# Patient Record
Sex: Female | Born: 1935 | Race: White | Hispanic: No | Smoking: Former smoker
Health system: Southern US, Community
[De-identification: ages and names within clinical notes are randomized; demographics above are authoritative.]

## PROBLEM LIST (undated history)

## (undated) DIAGNOSIS — E785 Hyperlipidemia, unspecified: Secondary | ICD-10-CM

## (undated) DIAGNOSIS — G479 Sleep disorder, unspecified: Secondary | ICD-10-CM

## (undated) DIAGNOSIS — R413 Other amnesia: Principal | ICD-10-CM

## (undated) DIAGNOSIS — G459 Transient cerebral ischemic attack, unspecified: Secondary | ICD-10-CM

## (undated) DIAGNOSIS — Z860100 Personal history of colon polyps, unspecified: Secondary | ICD-10-CM

## (undated) DIAGNOSIS — R269 Unspecified abnormalities of gait and mobility: Secondary | ICD-10-CM

## (undated) DIAGNOSIS — F0390 Unspecified dementia without behavioral disturbance: Secondary | ICD-10-CM

## (undated) DIAGNOSIS — G2581 Restless legs syndrome: Secondary | ICD-10-CM

## (undated) DIAGNOSIS — I8390 Asymptomatic varicose veins of unspecified lower extremity: Secondary | ICD-10-CM

## (undated) DIAGNOSIS — I10 Essential (primary) hypertension: Secondary | ICD-10-CM

## (undated) DIAGNOSIS — I6529 Occlusion and stenosis of unspecified carotid artery: Secondary | ICD-10-CM

## (undated) DIAGNOSIS — M47812 Spondylosis without myelopathy or radiculopathy, cervical region: Secondary | ICD-10-CM

## (undated) DIAGNOSIS — S060X9A Concussion with loss of consciousness of unspecified duration, initial encounter: Secondary | ICD-10-CM

## (undated) HISTORY — DX: Occlusion and stenosis of unspecified carotid artery: I65.29

## (undated) HISTORY — DX: Concussion with loss of consciousness of unspecified duration, initial encounter: S06.0X9A

## (undated) HISTORY — DX: Personal history of colonic polyps: Z86.010

## (undated) HISTORY — DX: Sleep disorder, unspecified: G47.9

## (undated) HISTORY — PX: REDUCTION MAMMAPLASTY: SUR839

## (undated) HISTORY — PX: ENDOVENOUS ABLATION SAPHENOUS VEIN W/ LASER: SUR449

## (undated) HISTORY — DX: Other amnesia: R41.3

## (undated) HISTORY — PX: HEMORRHOID SURGERY: SHX153

## (undated) HISTORY — DX: Unspecified abnormalities of gait and mobility: R26.9

## (undated) HISTORY — DX: Transient cerebral ischemic attack, unspecified: G45.9

## (undated) HISTORY — DX: Spondylosis without myelopathy or radiculopathy, cervical region: M47.812

## (undated) HISTORY — DX: Restless legs syndrome: G25.81

## (undated) HISTORY — DX: Asymptomatic varicose veins of unspecified lower extremity: I83.90

## (undated) HISTORY — PX: ANTERIOR AND POSTERIOR VAGINAL REPAIR W/ SACROSPINOUS LIGAMENT SUSPENSION: SUR6

## (undated) HISTORY — DX: Essential (primary) hypertension: I10

## (undated) HISTORY — DX: Hyperlipidemia, unspecified: E78.5

## (undated) HISTORY — DX: Personal history of colon polyps, unspecified: Z86.0100

## (undated) HISTORY — PX: JOINT REPLACEMENT: SHX530

---

## 1997-11-24 ENCOUNTER — Other Ambulatory Visit: Admission: RE | Admit: 1997-11-24 | Discharge: 1997-11-24 | Payer: Self-pay | Admitting: Family Medicine

## 1998-01-09 ENCOUNTER — Ambulatory Visit (HOSPITAL_COMMUNITY): Admission: RE | Admit: 1998-01-09 | Discharge: 1998-01-09 | Payer: Self-pay | Admitting: Gastroenterology

## 1998-07-02 ENCOUNTER — Ambulatory Visit (HOSPITAL_COMMUNITY): Admission: RE | Admit: 1998-07-02 | Discharge: 1998-07-02 | Payer: Self-pay | Admitting: Gastroenterology

## 1999-05-07 ENCOUNTER — Other Ambulatory Visit: Admission: RE | Admit: 1999-05-07 | Discharge: 1999-05-07 | Payer: Self-pay | Admitting: Obstetrics and Gynecology

## 2000-05-12 ENCOUNTER — Other Ambulatory Visit: Admission: RE | Admit: 2000-05-12 | Discharge: 2000-05-12 | Payer: Self-pay | Admitting: Obstetrics and Gynecology

## 2000-12-29 ENCOUNTER — Inpatient Hospital Stay (HOSPITAL_COMMUNITY): Admission: EM | Admit: 2000-12-29 | Discharge: 2000-12-30 | Payer: Self-pay | Admitting: Emergency Medicine

## 2000-12-29 ENCOUNTER — Encounter: Payer: Self-pay | Admitting: Emergency Medicine

## 2001-05-18 ENCOUNTER — Other Ambulatory Visit: Admission: RE | Admit: 2001-05-18 | Discharge: 2001-05-18 | Payer: Self-pay | Admitting: Obstetrics and Gynecology

## 2001-09-15 ENCOUNTER — Encounter: Admission: RE | Admit: 2001-09-15 | Discharge: 2001-09-15 | Payer: Self-pay

## 2001-09-29 ENCOUNTER — Ambulatory Visit (HOSPITAL_COMMUNITY): Admission: RE | Admit: 2001-09-29 | Discharge: 2001-09-29 | Payer: Self-pay | Admitting: Gastroenterology

## 2001-12-01 ENCOUNTER — Emergency Department (HOSPITAL_COMMUNITY): Admission: EM | Admit: 2001-12-01 | Discharge: 2001-12-01 | Payer: Self-pay | Admitting: Emergency Medicine

## 2001-12-01 ENCOUNTER — Encounter: Payer: Self-pay | Admitting: Emergency Medicine

## 2003-03-01 ENCOUNTER — Ambulatory Visit (HOSPITAL_COMMUNITY): Admission: RE | Admit: 2003-03-01 | Discharge: 2003-03-01 | Payer: Self-pay | Admitting: Obstetrics and Gynecology

## 2003-03-01 ENCOUNTER — Encounter: Payer: Self-pay | Admitting: Obstetrics and Gynecology

## 2003-03-16 ENCOUNTER — Inpatient Hospital Stay (HOSPITAL_COMMUNITY): Admission: RE | Admit: 2003-03-16 | Discharge: 2003-03-18 | Payer: Self-pay | Admitting: Obstetrics and Gynecology

## 2003-09-17 ENCOUNTER — Encounter: Admission: RE | Admit: 2003-09-17 | Discharge: 2003-09-17 | Payer: Self-pay | Admitting: Orthopedic Surgery

## 2004-01-26 ENCOUNTER — Observation Stay (HOSPITAL_COMMUNITY): Admission: EM | Admit: 2004-01-26 | Discharge: 2004-01-26 | Payer: Self-pay | Admitting: Emergency Medicine

## 2004-08-30 ENCOUNTER — Encounter: Admission: RE | Admit: 2004-08-30 | Discharge: 2004-10-03 | Payer: Self-pay | Admitting: Family Medicine

## 2005-02-06 ENCOUNTER — Ambulatory Visit (HOSPITAL_COMMUNITY): Admission: RE | Admit: 2005-02-06 | Discharge: 2005-02-06 | Payer: Self-pay | Admitting: Gastroenterology

## 2007-06-21 ENCOUNTER — Inpatient Hospital Stay (HOSPITAL_COMMUNITY): Admission: EM | Admit: 2007-06-21 | Discharge: 2007-06-24 | Payer: Self-pay | Admitting: Emergency Medicine

## 2008-10-21 ENCOUNTER — Encounter: Admission: RE | Admit: 2008-10-21 | Discharge: 2008-10-21 | Payer: Self-pay | Admitting: Specialist

## 2009-12-26 ENCOUNTER — Ambulatory Visit: Payer: Self-pay | Admitting: Vascular Surgery

## 2010-06-07 ENCOUNTER — Inpatient Hospital Stay (HOSPITAL_COMMUNITY)
Admission: EM | Admit: 2010-06-07 | Discharge: 2010-06-09 | Payer: Self-pay | Source: Home / Self Care | Attending: Internal Medicine | Admitting: Internal Medicine

## 2010-06-08 ENCOUNTER — Encounter (INDEPENDENT_AMBULATORY_CARE_PROVIDER_SITE_OTHER): Payer: Self-pay | Admitting: Internal Medicine

## 2010-06-14 ENCOUNTER — Ambulatory Visit
Admission: RE | Admit: 2010-06-14 | Discharge: 2010-06-14 | Payer: Self-pay | Source: Home / Self Care | Attending: Vascular Surgery | Admitting: Vascular Surgery

## 2010-06-14 ENCOUNTER — Ambulatory Visit: Admit: 2010-06-14 | Payer: Self-pay | Admitting: Vascular Surgery

## 2010-07-03 ENCOUNTER — Ambulatory Visit
Admission: RE | Admit: 2010-07-03 | Discharge: 2010-07-03 | Payer: Self-pay | Source: Home / Self Care | Attending: Vascular Surgery | Admitting: Vascular Surgery

## 2010-07-04 NOTE — Assessment & Plan Note (Signed)
OFFICE VISIT  Heather Floyd, Heather Floyd DOB:  1932/06/28                                       07/03/2010 [id number]#:[id number]  The patient presents today for continued discussion regarding her left leg venous hypertension.  I had seen her initially for this back in July 2011.  At that time she had a formal duplex showing reflux throughout her left greater saphenous vein and she did have mild reflux in her left common femoral vein.  At that time she was having no discomfort.  She has had progressive changes since the last 6 months and now has increasing pain and marked swelling in her left calf and ankle.  She works in [employer] for long shifts and stands the entire time and it is making it very difficult for her to do this.  She has worn compression garments off and on for many years but does not have a new fresh pair.  She does not have any history of DVT.  Sounds like her imaging reveals reflux into a large plexus of varicosities over the pretibial area.  Her formal duplex did show prior reflux in this area. We have fitted her with new compression garments today, 20-30 mmHg thigh- high compression and instructed her on the use of these.  I plan to see her again in 3 months to determine if this is giving her adequate treatment.  If not, we would recommend ablation of her saphenous vein in her left leg.    Larina Earthly, M.D. Electronically Signed  TFE/MEDQ  D:  07/03/2010  T:  07/04/2010  Job:  [id number]

## 2010-07-07 ENCOUNTER — Encounter: Payer: Self-pay | Admitting: Family Medicine

## 2010-08-26 LAB — CARDIAC PANEL(CRET KIN+CKTOT+MB+TROPI)
CK, MB: 2 ng/mL (ref 0.3–4.0)
Relative Index: INVALID (ref 0.0–2.5)
Troponin I: 0.01 ng/mL (ref 0.00–0.06)
Troponin I: 0.01 ng/mL (ref 0.00–0.06)

## 2010-08-26 LAB — COMPREHENSIVE METABOLIC PANEL
ALT: 15 U/L (ref 0–35)
AST: 19 U/L (ref 0–37)
Albumin: 3.1 g/dL — ABNORMAL LOW (ref 3.5–5.2)
CO2: 29 mEq/L (ref 19–32)
Chloride: 107 mEq/L (ref 96–112)
GFR calc Af Amer: >60 mL/min (ref >60)
GFR calc non Af Amer: 60 mL/min — ABNORMAL LOW (ref >60)
Potassium: 3.9 mEq/L (ref 3.5–5.1)
Sodium: 139 mEq/L (ref 135–145)
Total Bilirubin: 0.4 mg/dL (ref 0.3–1.2)

## 2010-08-26 LAB — DIFFERENTIAL
Basophils Relative: 1 % (ref 0–1)
Eosinophils Absolute: 0.1 10*3/uL (ref 0.0–0.7)
Eosinophils Relative: 3 % (ref 0–5)
Monocytes Relative: 13 % — ABNORMAL HIGH (ref 3–12)
Neutrophils Relative %: 56 % (ref 43–77)

## 2010-08-26 LAB — CBC
Hemoglobin: 11.7 g/dL — ABNORMAL LOW (ref 12.0–15.0)
Hemoglobin: 13 g/dL (ref 12.0–15.0)
MCH: 31.6 pg (ref 26.0–34.0)
MCHC: 35.7 g/dL (ref 30.0–36.0)
RBC: 3.73 MIL/uL — ABNORMAL LOW (ref 3.87–5.11)
WBC: 3.5 10*3/uL — ABNORMAL LOW (ref 4.0–10.5)

## 2010-08-26 LAB — BASIC METABOLIC PANEL
CO2: 26 mEq/L (ref 19–32)
Calcium: 9.7 mg/dL (ref 8.4–10.5)
Creatinine, Ser: 0.88 mg/dL (ref 0.4–1.2)
Glucose, Bld: 120 mg/dL — ABNORMAL HIGH (ref 70–99)
Sodium: 132 mEq/L — ABNORMAL LOW (ref 135–145)

## 2010-08-26 LAB — URINALYSIS, ROUTINE W REFLEX MICROSCOPIC
Bilirubin Urine: NEGATIVE
Glucose, UA: NEGATIVE mg/dL
Hgb urine dipstick: NEGATIVE
Ketones, ur: NEGATIVE mg/dL
pH: 6 (ref 5.0–8.0)

## 2010-08-26 LAB — LIPID PANEL: VLDL: 26 mg/dL (ref 0–40)

## 2010-08-26 LAB — URINE CULTURE
Colony Count: NO GROWTH
Colony Count: NO GROWTH
Culture  Setup Time: 201112240404
Culture  Setup Time: 201112242057
Culture: NO GROWTH

## 2010-08-26 LAB — URINE MICROSCOPIC-ADD ON

## 2010-08-26 LAB — POCT CARDIAC MARKERS
CKMB, poc: <1 ng/mL — ABNORMAL LOW (ref 1.0–8.0)
Troponin i, poc: <0.05 ng/mL (ref 0.00–0.09)

## 2010-08-26 LAB — PROTIME-INR: INR: 1.06 (ref 0.00–1.49)

## 2010-09-24 ENCOUNTER — Ambulatory Visit (INDEPENDENT_AMBULATORY_CARE_PROVIDER_SITE_OTHER): Payer: Medicare Other | Admitting: Vascular Surgery

## 2010-09-24 DIAGNOSIS — I83893 Varicose veins of bilateral lower extremities with other complications: Secondary | ICD-10-CM

## 2010-09-24 NOTE — Assessment & Plan Note (Signed)
OFFICE VISIT  Heather Floyd, Heather Floyd DOB:  [birth year]                                       09/24/2010 [id number]#:[id number]  Patient presents today for continued follow-up of her left leg venous hypertension.  She was seen most recently in January and has been in thigh-high 20-30 mm Hg graduated compression garments for 3 months.  She elevates her legs when possible and does take ibuprofen for the discomfort.  She reports that despite this, she continues to have pain related to standing.  She serves and cleans the school cafeteria, and her job requires long periods of standing, which is difficult due to the leg pain associated with the varices.  She also reports that cooking, cleaning, and shopping are difficult due to the leg pain as well, and she has had to reduce this activity.  PHYSICAL EXAMINATION:  She is a well-developed and well-nourished white female appearing younger than stated age of 76.  Blood pressure is 170/88, pulse 76, respirations 16.  She does have a palpable dorsalis pedis pulse on the left.  She has marked varicosities extending down through her medial thigh and over her pretibial area.  These are quite engorged and tender to her.  On duplex, she does have an anterior accessory branch of her great saphenous vein, which is leading directly into this large varices that extends the length of her legs.  I feel that she has clearly failed conservative therapy.  I would recommend laser ablation of her anterior accessory branch of her great saphenous vein to reduce her venous hypertension and stab phlebectomy of the varices throughout her thigh and calf.  I feel this would give her outstanding symptom relief.  She understands and wishes to proceed as soon as we can assure insurance coverage for her.    Larina Earthly, M.D. Electronically Signed  TFE/MEDQ  D:  09/24/2010  T:  09/24/2010  Job:  [id number]  cc:   Sigmund Hazel, M.D.

## 2010-10-08 ENCOUNTER — Ambulatory Visit: Payer: Self-pay | Admitting: Vascular Surgery

## 2010-10-22 ENCOUNTER — Ambulatory Visit (INDEPENDENT_AMBULATORY_CARE_PROVIDER_SITE_OTHER): Payer: Medicare Other | Admitting: Vascular Surgery

## 2010-10-22 DIAGNOSIS — I83893 Varicose veins of bilateral lower extremities with other complications: Secondary | ICD-10-CM

## 2010-10-23 NOTE — Assessment & Plan Note (Signed)
OFFICE VISIT  Heather Floyd, Heather Floyd DOB:  31-May-1933                                       10/22/2010 [id number]#:[id number]  This patient presents today for continued discussion regarding venous hypertension in her left leg.  She continues to have discomfort despite wearing compression.  She works as a Engineer, petroleum and has pain with prolonged standing, most particularly over the varicosities over her medial thighs and in the pretibial area extending down to the ankle.  I have re-imaged this today with SonoSite ultrasound.  There had been some confusion regarding the location of her pathology.  On her formal duplex study, her interpretation was of her great saphenous vein with reflux.  When I imaged this with SonoSite ultrasound, this vein still begins at the typical pattern in the saphenofemoral junction and extends over the medial thigh up towards her anterior thigh.  This is the more common location of the anterior branch of the saphenous vein.  On imaging this further, there is no other branch so this apparently is a more anterior location of her great saphenous vein.  This is the only vein present in her thigh.  I have recommended laser ablation of this great saphenous vein for treatment of her venous hypertension.  She does have multiple tributaries varicosities and she understands that this hopefully will be decompressed with ablation of her great saphenous vein.  If she has continued difficulty, she would be a candidate for stab phlebectomy if she needs further treatment.  Will schedule this at her earliest convenience.    Larina Earthly, M.D. Electronically Signed  TFE/MEDQ  D:  10/22/2010  T:  10/23/2010  Job:  [id number]

## 2010-10-29 NOTE — Discharge Summary (Signed)
[name], Floyd NO.:  [id number]   MEDICAL RECORD NO.:  [id number]          PATIENT TYPE:  INP   LOCATION:  1426                         FACILITY:  [facility]   PHYSICIAN:  Ramiro Harvest, MD    DATE OF BIRTH:  1933/06/01   DATE OF ADMISSION:  06/21/2007  DATE OF DISCHARGE:  06/24/2007                               DISCHARGE SUMMARY   PRIMARY CARE PHYSICIAN:  Sigmund Hazel, M.D. of [facility].   GASTROENTEROLOGIST:  Dr. Ewing Schlein of [facility].   DISCHARGE DIAGNOSES:  1. Viral gastroenteritis.  2. Anemia.  3. Hypokalemia.  4. Hyperlipidemia.  5. Osteoporosis.  6. History of colonic polyps.  7. Cervical disk disease.  8. Restless leg syndrome.  9. Internal/external hemorrhoids.   DISCHARGE MEDICATIONS:  1. Celebrex 200 mg p.o. daily.  2. Avalide 150/12.5 mg p.o. daily.  3. Tri-Chlor 145 mg p.o. daily.  4. Vitamin E 400 international units daily.  5. Aspirin 81 mg p.o. daily.  6. Tylenol arthritis daily.  7. Vitamin B12 250 mg daily.  8. Multivitamin one tablet daily.  9. Vitamin D 400 international units daily.  10.Vitamin C 500 mg daily.  11.Zocor 40 mg q.h.s.  12.Flomax 70 mg q. Weekly.  13.Glucosamine daily.  14.Correctol p.r.n.  15.Tylenol Extra Strength as needed.  16.Cosamin DS daily.  17.Mirapex 0.25 mg 1-2 hours before bedtime.   DISPOSITION/FOLLOWUP:  The patient is to schedule a follow-up  appointment with her primary care physician in the next 1-2 weeks.  A  follow-up basic metabolic profile needs to be checked to follow up on  the patient's electrolytes, mainly her potassium levels.  CBC also needs  to be checked to follow up on the patient's hemoglobin.  The patient had  a recent colonoscopy which was done by Dr. Ewing Schlein in August 2006 which  revealed internal and external hemorrhoids and follow-up for that is in  the next 5 years.  May follow-up with Dr. Ewing Schlein as needed.   PROCEDURES PERFORMED:  1. A CT of the abdomen and pelvis  was performed on June 21, 2007,      which showed significant inflammatory process involving the small      bowel loops in the primary left abdomen and diffuse wall thickening      of the small bowel associated mesenteric edema.  The differential      includes inflammatory bowel disease/Crohn's disease versus      infectious neuritis, ischemia not excluded.  No definite findings      of acute vascular process additionally noted, single minimally      enlarged portacaval lymph nodes, bibasilar atelectasis in the lower      lobe, small bowel inflammatory process as previously identified.      Infectious enteritis inflammatory bowel disease versus Crohn      disease over ischemia on the CT of the pelvis.  A CT angiogram was      done on June 22, 2007 which showed no significant arterial      occlusive disease of the visceral vasculature to resolve the  mesenteric ischemia.  Small bowel wall thickening has improved.   CONSULTATIONS:  A GI consultation was done with [facility].  The patient was seen on June 22, 2007.  The patient was seen by Dr.  Charlott Rakes of [facility]   ADMISSION HISTORY AND PHYSICAL:  Heather Floyd is a 77 year old white  female with history of hypertension and hyperlipidemia, history of  colonic polyps followed by Vida Rigger, history of cervical disk  disease/arthritis and osteoporosis who presents with complaints of  abdominal pain.  The patient stated that almost 2 weeks prior to  admission, specifically the day after Christmas.  She began having some  vague abdominal pain.  She thought it may have been related to something  she ate the day before and also thought it would eventually resolve.  Pain continued and following New Years it began to worsen.  The patient  admits to nausea and one episode of vomiting.  The patient denies  diarrhea.  She reports that pain is diffuse when asked to further  characterize it.  She states that it is just  a pain that not sharp.  Rates it at a 9/10 in intensity at its worse.  The patient denies any  sick contacts.  Also, denies fevers, dysuria and no melena.  She admits  to some bright red blood per rectum, but usually only when she is  straining.  The patient denies chest pain, cough, no shortness of  breath.  The patient was seen in the ED.  CT scan of abdomen was done  which revealed significant inflammatory process involving small bowel  loops in primarily the left.  Abdomen with diffuse wall thickening of  the small bowel and associated mesenteric edema.  Lipase was done which  was within normal limits and the urinalysis unremarkable.  LFTs also  unremarkable.  The patient admitted for further evaluation and  management.   PHYSICAL EXAMINATION:  VITAL SIGNS:  On admission, temperature 97.5,  blood pressure 105/50 initially 130/62, pulse of 94 initially 136,  respiratory rate 18, sating 96%.  GENERAL:  The patient was pleasant elderly white female, no respiratory  distress.  HEENT: Normocephalic, atraumatic.  Pupils equal, round and reactive to  light.  Extraocular movements intact.  Mucous membranes were slightly  dry.  No oral exudates or lesions.  RESPIRATORY:  Lungs clear to auscultation bilaterally.  No crackles or  wheezes.  CARDIOVASCULAR: Regular rate and rhythm.  Normal S1-S2.  ABDOMEN:  Soft, diffusely tender, no rebound tenderness.  Positive bowel  sounds.  EXTREMITIES:  No clubbing, cyanosis or edema.  NEUROLOGICAL:  The patient was alert and oriented x3.  Cranial nerves II-  XII grossly intact.  Nonfocal exam.   LABORATORY DATA:  Admission labs as stated in HPI.   HOSPITAL COURSE:  #1 - VIRAL GASTROENTERITIS.  Initially, per CT scan,  the differential included infectious process versus inflammatory process  with ischemic process.  CT angio of the abdomen was done with results as  stated above.  The patient was put on bowel rest.  Pain management was  done.  The  patient was placed on IV fluids.  A lactic acid level was  checked which was negative.  Lipase was also checked which was negative.  Cardiac enzymes were cycled which were negative.  Stool cultures were  also obtained which were negative.  The patient continued to improve.  Gastroenterology followed the patient throughout the hospitalization.  The patient continued to improve daily.  By day of discharge the patient  was in stable and improved condition.  The patient had no complaints of  abdominal pain.  The patient was tolerating oral diet well with no  abdominal pain.  The patient was discharged in stable and improved  condition.  #2 - ANEMIA.  The patient did come in with anemia.  Hemoglobin of 11.4.  anemia panel was obtained which showed the patient with iron deficiency  likely due to chronic disease.  The patient had been followed as  outpatient per GI and has had a colonoscopy done in August 2006.  #3 - HYPERKALEMIA.  The patient's potassium was repleted throughout the  hospitalization.  On day of discharge, the patient had a potassium of  3.3 was given a dose of K-Dur and will have to follow-up B-met per PCP.  The rest of the patient's chronic medical issues were stable throughout  the hospitalization.  The patient was discharged in stable and improved  condition.   PHYSICAL EXAMINATION:  VITAL SIGNS: Temperature 98.4, pulse of 66, blood  pressure 135/53, respiratory rate 20, sating 94% on room air.   DISCHARGE LABORATORIES:  Sodium 141, potassium 3.3, chloride 109, bicarb  26, BUN 4, creatinine 0.68, calcium of 8.3, glucose of 99, white count  5.0, hemoglobin 11.1, platelets 237, hematocrit 31.1.  RBCs of 3.61,  iron of 37, TIBC of 289, percent sats of 13.  The patient will be  discharged in stable and improved condition.   It has been a pleasure taking care of [name].      Ramiro Harvest, MD  Electronically Signed     DT/MEDQ  D:  06/24/2007  T:  06/24/2007   Job:  [id number]   cc:   Sigmund Hazel, M.D.  Fax: [phone]   Petra Kuba, M.D.  Fax: [phone]

## 2010-10-29 NOTE — Assessment & Plan Note (Signed)
OFFICE VISIT   Heather Floyd, Heather Floyd  DOB:  [birth year]                                       06/14/2010  [id number]#:[id number]   This is a new patient consultation.  Primary physician is Sigmund Hazel,  M.D.   HISTORY OF PRESENT ILLNESS:  This is a 77 year old female who presents  with a chief complaint of episodes of dizziness and visual change.  She  apparently was seen at an outside hospital and admitted to the outside  hospital on 06/07/2010 by Dr. Isidoro Donning at that point for what was thought to  be possibly a transient ischemic attack.  Her workup included a CT which  initially was read as possibly consistent with some bilateral lacunar  strokes.  Subsequent head MRI demonstrated no infarctions.  MRA of the  neck however demonstrated a left subclavian artery stenosis and also a  moderate to high-grade stenosis of proximal right external carotid  artery.  There was no internal carotid artery stenosis on it.  In  talking with the patient she notes that what started this entire episode  was waking up around 5:00 and she awoke with blurry vision and dizziness  greater than her baseline vertigo that she has intermittently.  Based on  that she went into the ER for evaluation and ended up being admitted  with the above workup.  She was referred to [place] for evaluation for  possible surgically amenable carotid disease.  At this point the patient  denies any other episodes of stroke or TIA.  Specifically no episodes of  amaurosis fugax or monocular blindness.  She has never had any facial  droop or hemiplegia and also she has never had any expressive or  receptive aphasia.  Her risk factors for carotid disease include  hyperlipidemia, hypertension.  Her risk factor management includes the  use of Zocor, aspirin and lisinopril.  Her neurologic review of systems  was positive for baseline vertigo and this episode of dizziness.  Otherwise she did not have any seizure, para or  anesthesia history.  No  headaches, dementia, parkinsonism or Alzheimer's disease.   PAST MEDICAL HISTORY:  Included possible TIA, hyponatremia, hypokalemia,  anxiety, restless leg syndrome, hyperlipidemia, UTI, chronic vertigo,  osteoporosis, cervical degenerative disk disease, hemorrhoids, history  of vaginal prolapse, history of possible carcinoid syndrome, GI  bleeding.   PAST SURGICAL HISTORY:  Included some type of vaginal suspension  procedure.   SOCIAL HISTORY:  She denied ever smoking.  No alcohol or illicit drug  use.   FAMILY HISTORY:  Father had metastatic cancer to his bones, mother had  Alzheimer's disease.   MEDICATIONS:  Her medications included aspirin, Keflex, calcium,  Fosamax, Vicodin, lisinopril, fenofibrate, multivitamin, Requip, Zocor,  Tylenol, vitamin D.   ALLERGIES:  She had allergy to codeine.   PHYSICAL EXAMINATION:  Today vital signs were blood pressure in the  right arm of 167/71, left 162/71, heart rate of 65, respirations 12,  satting 97% on room air.  General:  Alert and oriented x3, well-developed, well-nourished, no  apparent distress.  Head:  Normocephalic, atraumatic.  ENT:  Hearing is grossly intact.  Nares without erythema or drainage.  Oropharynx without any erythema or exudate.  Eyes:  Pupils were equal, round, reactive to light.  Extraocular  movements were intact.  Neck:  Supple neck.  No nuchal rigidity or  palpable lymphadenopathy.  Pulmonary:  Symmetric expansion, good air movement.  Clear to  auscultation bilaterally.  No rales, rhonchi or wheezing.  Cardiac:  Regular rate and rhythm.  Normal S1-S2.  No murmurs, rubs,  thrills or gallops.  Vascular:  Pulses were palpable in all extremities.  Carotids did not  have any carotid bruits.  The aorta was not palpable.  GI:  Soft abdomen, nontender, nondistended, no guarding, no rebound, no  hepatosplenomegaly.  No masses.  No costovertebral angle tenderness.  Musculoskeletal:  All  extremities had 5/5 strength.  There were no signs  of any ischemia, gangrene or ulcerations in any extremity.  Neurological:  Cranial nerves II-XII were intact.  Motor was as above.  Sensation was grossly intact in all extremities.  Psychiatric:  Judgment was intact.  Mood and affect were appropriate for  her clinical situation.  Skin:  There were no rashes noted on her body.  Her extremities were as  listed above.  Lymphatic:  The was no cervical, axillary or inguinal lymphadenopathy.   NONINVASIVE VASCULAR IMAGING:  She had bilateral carotid duplexes which  demonstrated no disease in the internal or external carotid arteries.   MEDICAL DECISION MAKING:  This is a 77 year old female that presents  with a history that I am not convinced is consistent with a TIA.  She  had no evidence of any residual stroke on examination.  She has a  carotid duplex which is completely normal.  I think in reviewing her  history some additional differential including evaluation of her eyes  with a slit lamp exam to look for any macular degeneration or glaucoma  is indicated.  Also there are components of the history that are more  consistent where a cardiogenic etiology.  At this point she will not  need any vascular surgical intervention.  I discussed my concerns with  the patient and she agrees she is going to go see her eye doctor and  then the rest of her workup she will continue with her primary care  physician.   I would like to thank you for giving [place] the opportunity to participate  in this patient's care.  We are available to help her in any fashion as  needed.  Thank you very much.     Fransisco Hertz, MD  Electronically Signed   BLC/MEDQ  D:  06/14/2010  T:  06/14/2010  Job:  [id number]   cc:   Sigmund Hazel, M.D.

## 2010-10-29 NOTE — Procedures (Signed)
LOWER EXTREMITY VENOUS REFLUX EXAM   INDICATION:  Left lower extremity varicose veins with pain.   EXAM:  Using color-flow imaging and pulse Doppler spectral analysis, the  left common femoral, superficial femoral, popliteal, posterior tibial,  greater and lesser saphenous veins are evaluated.  There is evidence  suggesting deep venous insufficiency in the left lower extremity.   The left saphenofemoral junction is not competent with Reflux of  >538milliseconds. The left GSV is not competent with Reflux of  >554milliseconds with the caliber as described below.   The left proximal short saphenous vein demonstrates competency.   GSV Diameter (used if found to be incompetent only)                                            Right    Left  Proximal Greater Saphenous Vein           cm       0.54 cm  Proximal-to-mid-thigh                     cm       cm  Mid thigh                                 cm       0.5 cm  Mid-distal thigh                          cm       cm  Distal thigh                              cm       0.42 cm  Knee                                      cm       0.38 cm   IMPRESSION:  1. Left greater saphenous vein Reflux with >567milliseconds is      identified with the caliber ranging from 0.42 cm to 0.54 cm knee to      groin.  2. The left greater saphenous vein is not aneurysmal.  3. The left greater saphenous vein is tortuous mid thigh and distal.  4. The deep venous system is not competent with Reflux of      >538milliseconds.  5. The left lesser saphenous vein is competent.   ___________________________________________  Larina Earthly, M.D.   AS/MEDQ  D:  12/26/2009  T:  12/26/2009  Job:  [id number]

## 2010-10-29 NOTE — H&P (Signed)
Heather Floyd, Heather Floyd NO.:  [id number]   MEDICAL RECORD NO.:  [id number]          PATIENT TYPE:  INP   LOCATION:  1426                         FACILITY:  [facility]   PHYSICIAN:  Kela Millin, M.D.DATE OF BIRTH:  1933-05-14   DATE OF ADMISSION:  06/21/2007  DATE OF DISCHARGE:                              HISTORY & PHYSICAL   PRIMARY CARE PHYSICIAN:  Sigmund Hazel, M.D.   CHIEF COMPLAINT:  Abdominal pain.   HISTORY OF PRESENT ILLNESS:  The patient is a 77 year old white female  with history of hypertension, hyperlipidemia, history of colon polyps  (followed by Petra Kuba, M.D.), cervical disk disease/arthritis and  osteoporosis who presents with above complaints.  She states that almost  two weeks ago (the day after Christmas), she began having some vague  abdominal pain.  She thought that it might have been related to  something she ate the day before and also thought it would eventually  resolve.  The pain continued and following New Years it began to worsen.  She admits to nausea and vomiting x1 episode.  She denies diarrhea.  She  reports that the pain is diffuse and when asked to further characterize  she states that it is just a pain, not sharp.  She rates it at 9/10 in  intensity at its worst.  She denies any sick contacts.  She also denies  fevers, dysuria, and no melena.  She admits to some bright red blood per  rectum but usually only when she strains.  She denies chest pain, cough,  and no shortness of breath.   The patient was seen in the ER and a CT scan of her abdomen was done  which revealed a significant inflammatory process involving small bowel  loops in primarily the left abdomen, with diffuse wall thickening of  small bowel and associated mesenteric edema.  A lipase was done which  was within normal limits and her urinalysis unremarkable.  Her LFTs were  also unremarkable and she is admitted for further evaluation and  management.   PAST  MEDICAL HISTORY:  As above.   MEDICATIONS:  Ambien, aspirin, Avalide, calcium, Celebrex, folic acid,  Fosamax, glucosamine, Mirapex, Premarin, trazodone, TriCor, Tylenol,  vitamin C, and Zocor.  She does not know the dosages.   ALLERGIES:  No known drug allergies.   SOCIAL HISTORY:  She states that she smoked briefly as a teenager.  She  denies alcohol.   FAMILY HISTORY:  Her father had metastatic cancer to the bones and her  mother had Alzheimer's.   REVIEW OF SYSTEMS:  As per HPI, other review of systems negative.   PHYSICAL EXAMINATION:  GENERAL:  She is a pleasant, elderly white female  in no respiratory distress.  VITAL SIGNS:  Her temperature is 97.5, blood pressure 105/50 initially  130/62, pulse 94 initially 136, respiratory rate 18, O2 saturations 96%.  HEENT:  PERRL, EOMI, slightly dry mucous membranes.  No oral exudates.  LUNGS:  Clear to auscultation bilaterally, no crackles or wheezes.  CARDIOVASCULAR:  Regular rate and rhythm, normal S1 and S2.  ABDOMEN:  Soft, diffusely tender, no rebound tenderness, bowel sounds  present.  EXTREMITIES:  No cyanosis and no edema.  NEUROLOGY:  Alert and oriented x3.  Cranial nerves II-XII grossly  intact.  Nonfocal examination.   LABORATORY DATA:  As per HPI.   ASSESSMENT:  1. Abdominal pain.  Ischemia versus inflammatory bowel disease versus      infectious.  CT scan of abdomen as above.  We will keep NPO, IV      analgesics for pain management, IV fluids for supportive care and      follow.  We will also obtain a lactic acid level, cardiac enzymes,      stool cultures, a CBC, and follow and consult gastroenterology      versus surgery pending above studies for further recommendations.  2. Hypertension.  Monitor and treat as appropriate.  3. Hyperlipidemia.  Follow and resume outpatient medications when able      to tolerate p.o.      Kela Millin, M.D.  Electronically Signed     ACV/MEDQ  D:  06/22/2007  T:   06/22/2007  Job:  [id number]   cc:   Sigmund Hazel, M.D.  Fax: [phone]

## 2010-10-29 NOTE — Consult Note (Signed)
NEW PATIENT CONSULTATION   Heather Floyd, Heather Floyd  DOB:  Oct 15, 1932                                       12/26/2009  ZOXWR#:[id number]   The patient presents today for evaluation of lower extremity venous  pathology.  She is a very active, healthy 77 year old white female with  a long history of venous varicosities.  She relates an episode  approximately 30 years ago where she was felt to have left leg DVT and  was hospitalized for 10-12 days.  Details of this are not available.  She does not have any significant swelling in her lower extremities.  She does have marked venous varicosities more so in her left leg than  her right leg.  She does have marked spider vein telangiectasia on both  legs as well.  She does not have any history of bleeding and reports no  significant pain associated with these.  She does have elevated blood  pressure and elevated cholesterol.  No history of diabetes and no  cardiac difficulties.   SOCIAL HISTORY:  She is widowed with three children.  She works for  [employer] system.  She quit smoking 30 years ago.  Does not  drink alcohol.   Her family history is significant for venous varicosities in her mother.  No premature vascular disease.   REVIEW OF SYSTEMS:  Is noted in the chart.  Specifically no cardiac  difficulty.  Does have history of DVT and phlebitis.  MUSCULOSKELETAL:  For arthritis and joint pain in her hands.  GI:  For constipation.  Otherwise negative.  No weight loss or gain.  Her weight is 120 pounds.   PHYSICAL EXAMINATION:  General:  A well-developed, well-nourished white  female appearing stated age in no acute stress.  Vital signs:  Blood  pressure is 160/74, pulse 93, respirations 16.  HEENT:  Normal.  She has  2+ radial and 2+ dorsalis pedis pulses bilaterally.  Musculoskeletal:  Shows no major deformities or cyanosis.  Neurological:  No focal  weakness or paresthesias.  Skin:  Without ulcers or rashes.   She does  have marked pretibial varicosities in the left leg and some on her  thigh.  She does have extensive telangiectasia throughout both legs most  prominently on her right anterior thigh.   She underwent noninvasive vascular laboratory studies in our office and  this did show some reflux in her deep system on the left.  She does have  some reflux in her saphenous vein near the saphenofemoral junction with  these varicosities arising from this.  I had a long discussion with the  patient regarding the significance of this.  She does have reflux in her  superficial system and deep system on the left.  I explained that this  superficial hypertension could be treated but that we would recommend  this for either pain or the concern regarding appearance.  She reports  that she does not have any significant discomfort currently and is  comfortable with observation only.  She was reassured that this should  not put her at any increased risk for serious difficulty such as DVT.  She will see [place] again on an as-needed basis.     Larina Earthly, M.D.  Electronically Signed   TFE/MEDQ  D:  12/26/2009  T:  12/27/2009  Job:  [id number]  cc:   Pam Drown, M.D.

## 2010-10-29 NOTE — Procedures (Signed)
CAROTID DUPLEX EXAM   INDICATION:  Followup carotid artery disease.   HISTORY:  Diabetes:  No.  Cardiac:  No.  Hypertension:  Yes.  Smoking:  Previous.  Previous Surgery:  No.  CV History:  The patient is currently asymptomatic.  Amaurosis Fugax No, Paresthesias No, Hemiparesis No                                       RIGHT             LEFT  Brachial systolic pressure:         140               146  Brachial Doppler waveforms:         WNL               WNL  Vertebral direction of flow:        Antegrade         Antegrade  DUPLEX VELOCITIES (cm/sec)  CCA peak systolic                   67                76  ECA peak systolic                   79                99  ICA peak systolic                   50                67  ICA end diastolic                   13                21  PLAQUE MORPHOLOGY:                  Soft plaque       Soft plaque  PLAQUE AMOUNT:                      Minimal           Minimal  PLAQUE LOCATION:                    Bifurcation, ICA  Bifurcation, ICA   IMPRESSION:  Bilaterally no hemodynamically significant stenosis.  Tortuous right internal carotid artery.   ___________________________________________  Fransisco Hertz, MD   OD/MEDQ  D:  06/14/2010  T:  06/14/2010  Job:  [id number]

## 2010-10-29 NOTE — Consult Note (Signed)
Heather Floyd, Heather Floyd NO.:  [id number]   MEDICAL RECORD NO.:  [id number]          PATIENT TYPE:  INP   LOCATION:  1426                         FACILITY:  [facility]   PHYSICIAN:  Shirley Friar, MDDATE OF BIRTH:  05/25/1933   DATE OF CONSULTATION:  06/22/2007  DATE OF DISCHARGE:                                 CONSULTATION   REFERRING PHYSICIAN:  Kela Millin, M.D.   REASON FOR CONSULTATION:  Abdominal pain, abnormal CT scan.   HISTORY OF PRESENT ILLNESS:  This is a 77 year old white female who  reports being in her usual state of health until Saturday when she was  sick on her stomach and vomited one time and also had severe generalized  abdominal pain.  The pain persisted from January 3, and she came in for  management regarding that on January 5.  On presentation, she had a CT  scan done which showed some small-bowel wall thickening in the left  abdomen and left pelvis with small to moderate amount of free pelvic  fluid.  The colon and distal small-bowel loop was noted to be  unremarkable.  There was no free air seen on the CT scan.   She also reports that she has been having some lower back pain that  started around the time of this abdominal discomfort.  Her last meal  prior to the onset of this pain was at [employer] on Friday night.  She  denies any sick contacts or new medicines.  She also denies any similar  pain in the past like this.  The pain has been so severe she has been  unable to eat or sleep since it started.  She has a history of a  colonoscopy in 2006 which showed hemorrhoids but otherwise was normal,  although the area underneath the ileocecal valve could not be  identified.   PAST MEDICAL HISTORY:  1. Hypertension.  2. Hypercholesterolemia.  3. History of carcinoid tumor.  4. Cervical disk disease.  5. History of hemorrhoids.  6. Status post appendectomy.  7. Status post hysterectomy.  8. Status post bilateral  salpingo-oophorectomy.  9. History of pelvic prolapse.  10.History of vaginal prolapse.   MEDICATIONS:  See chart.   PHYSICAL EXAMINATION:  VITAL SIGNS:  Temperature 98.1, pulse 70, blood  pressure 104/62, O2 saturation 94% on room air.  GENERAL:  Alert, mild acute distress.  ABDOMEN:  Distended, generalized tenderness with guarding, no rebound,  positive bowel sounds.   LABORATORY DATA:  White blood count 4.1, hemoglobin 10.8, platelet count  208. Lipase 16.  Lactic acid 0.8.   IMPRESSION:  A 77 year old white female with sudden onset of severe  abdominal pain, nausea and one episode of vomiting with the pain going  on for the past 3 days.  CT scan  reviewed showing some thickened areas  of small bowel.  Main differential regarding her presentation is  gastroenteritis versus mesenteric ischemia.  I doubt she has Crohn's  disease as the source of her symptoms.   PLAN:  1. Do a CT abdominal angiogram to further evaluate  and look for any      evidence of ischemia.  2. Will continue bowel rest, IV fluids.  3. May also need to consider small-bowel series if angiogram is      negative for any significant stenoses.      Shirley Friar, MD  Electronically Signed     VCS/MEDQ  D:  06/22/2007  T:  06/22/2007  Job:  [id number]   cc:   Petra Kuba, M.D.  Fax: [phone]

## 2010-11-01 NOTE — Discharge Summary (Signed)
NAME:  Heather Floyd, Heather Floyd                           ACCOUNT NO.:  [id number]   MEDICAL RECORD NO.:  [id number]                   PATIENT TYPE:  INP   LOCATION:  9324                                 FACILITY:  [facility]   PHYSICIAN:  Huel Cote, M.D.              DATE OF BIRTH:  06/02/1933   DATE OF ADMISSION:  03/15/2003  DATE OF DISCHARGE:  03/18/2003                                 DISCHARGE SUMMARY   DISCHARGE DIAGNOSES:  1. Pelvic prolapse, cuff prolapse, cystocele, and rectocele.  2. Status post anterior posterior repair with sacrospinous ligament     suspension.  3. Status post postoperative fever treated with IV antibiotics.   DISCHARGE MEDICATIONS:  1. Motrin 600 mg p.o. every six hours p.r.n.  2. Percocet one to two tablets p.o. every four hours p.r.n.  3. Tricor one p.o. daily.  4. Avalide 150/12.5 one p.o. daily.   DISCHARGE FOLLOWUP:  The patient is to follow up in approximately four to  six weeks in the office for pelvic exam.   HOSPITAL COURSE:  The patient is a 77 year old female who presented for  postoperative care status post an anterior posterior repair and sacrospinous  ligament suspension which was performed on March 15, 2003. The patient  immediately on postoperative day #1 complained of severe nausea and did not  actually have any emesis; however, felt very nauseated for most of the day.  This improved when she was placed on nonnarcotic pain medicine. Her  temperature spiked unexpectedly to 102.8, and at that point, she was placed  on IV Unasyn for antibiotic coverage. A urine sample was obtained and CBC  and white blood cell count remained normal. Urine culture returned negative.  Her postoperative hemoglobin did drop from 13.1 to 10.4 on postoperative day  #1 and 9.1 on postoperative day #2. By the afternoon of postoperative day  #2, this had stabilized to 10.0. The patient continued to gradually improve;  however, retained some temperature until  postoperative day #3 when she  became completely afebrile. Physical exam remained benign the entire  hospital course with a soft abdomen and nontender. Her vaginal packing was  removed on first postoperative day with minimal bleeding noted, and there  was no significant bruising noted on her right buttock in the area of the  sacrospinous fixation. On postoperative day #3, she was feeling very well,  tolerating a regular diet with no nausea and vomiting. She was still sore  but was ambulating and taking very minimal pain medicines. She had remained  afebrile for approximately 24 hours with a very low grade temperature only  to 99.4 and was completely afebrile on discharge. Her abdomen was soft and  nontender. Therefore, her IV antibiotics were discontinued, and she was  discharged to home to follow up in approximately four to six weeks in the  office.  Huel Cote, M.D.    KR/MEDQ  D:  03/18/2003  T:  03/20/2003  Job:  [id number]

## 2010-11-01 NOTE — Op Note (Signed)
[place]  Patient:    Heather Floyd, Heather Floyd Visit Number: [id number] MRN: [id number]          Service Type: END Location: ENDO Attending Physician:  Nelda Marseille Dictated by:   Petra Kuba, M.D. Proc. Date: 09/29/01 Admit Date:  09/29/2001 Discharge Date: 09/29/2001   CC:         Helene Kelp, M.D.  Alvino Chapel, M.D.   Operative Report  PROCEDURE:  Colonoscopy.  INDICATION:  This is a patient with a history of carcinoid on a repeat colon screening. Consent was signed after risks, benefits, methods, and options were thoroughly discussed multiple times in the past.  MEDICINES USED:  Demerol 50, Versed 6.  RECTAL INSPECTION:  ___________ hemorrhoids, small.  DIGITAL EXAMINATION:  Negative.  DESCRIPTION OF PROCEDURE:  The video pediatric colonoscope was inserted and with some difficulty due to some tortuosity, was able to be advanced to the cecum. This required rolling her first on her back and then on her right side. The cecum was identified by the appendiceal orifice and the ileocecal valve. ________ was inserted _________ in the terminal ileum which was normal. Further documentation was obtained. The scope was slowly withdrawn. Prep was adequate. There was some liquid stool that required washing and suctioning. It was difficult to evaluate her hepatic flexure due to tortuosity. Once we fell back into the transverse, we had trouble readvancing, causing increased pain and elected to withdraw. No other abnormalities were seen as we slowly withdrew back to the rectum. In the rectum we could see the cautery from the previous polypectomy but no obvious residual tumor, masses, or other abnormalities. The scope was retroflexed, revealing some internal hemorrhoids. The scope was turned and readvanced  ______ colon air with suction. Scope was removed. The patient tolerated the procedure well. There was no obvious immediate  complication.  ENDOSCOPIC DIAGNOSES: 1. Internal/external hemorrhoids. 2. Rectal scar from previous polypectomy. 3. Otherwise within normal limits to terminal ileum.  PLAN:  _____________ rectals and guaiacs per either Dr. Phylliss Bob or Huel Cote. Have to see him back p.r.n. otherwise repeat screening in 5 years if doing well medically. Dictated by:   Petra Kuba, M.D. Attending Physician:  Nelda Marseille DD:  09/29/01 TD:  09/30/01 Job: [id number]

## 2010-11-01 NOTE — H&P (Signed)
NAME:  Heather Floyd, Heather Floyd                           ACCOUNT NO.:  [id number]   MEDICAL RECORD NO.:  [id number]                   PATIENT TYPE:  EMS   LOCATION:  ED                                   FACILITY:  [facility]   PHYSICIAN:  Isla Pence, M.D.             DATE OF BIRTH:  05-27-1933   DATE OF ADMISSION:  01/25/2004  DATE OF DISCHARGE:                                HISTORY & PHYSICAL   IDENTIFYING INFORMATION/JUSTIFICATION FOR ADMISSION AND CARE:  This is a 77-  year-old Caucasian female whose primary care physician is Dr. Dellis Anes.  Heller, who is here essentially with upper back and shoulder pain on the  left side.   HISTORY OF PRESENT ILLNESS:  This patient with no previous history of  myocardial infarction who does have history of cervical disk disease and  history of hypertension and hyperlipidemia comes in to the emergency room  because of concerns of her pain.  She has not had actual chest pain but  reports the pain is in the left shoulder, and she actually points the left  shoulder blade area, that has been coming on since Wednesday.  It is on and  off, lasting anywhere from 15 to 30 minutes.  The pain is characterized as  sharp in nature.  It can come on either when she is sitting or standing.  Most of the other times when she has had pain it has primarily been when she  goes to bed and when she may wake up and might notice the shoulder blade  pain going into her elbow in kind of an achy feeling.  This one is more  sharp in nature, it radiated into an area under her left breast.  There were  no associated symptoms with these pains.  She denies any strenuous activity  or lifting anything heavy.  She decided to come to the emergency room  because the pain was getting more severe in nature.  She took Bayer aspirin  at 1 time which did help but she felt she could not take the Bayer aspirin  x3-4 daily.  She denies any problems with her usual exercise when she walks  or does  aerobics (has had no symptoms with that).   CARDIAC RISK FACTORS:  She is postmenopausal.  She has history of  hypertension, hyperlipidemia.  No smoking history actually, she had smoked 3  cigarettes/week when she was 19 or 20 so there is no real significant  tobacco history.  She is not a diabetic.  There is family history of  myocardial infarction in her father in his 39's.   ALLERGIES:  No known drug allergies.   CURRENT MEDICATIONS:  1. Celebrex (dose unknown) she takes it x1 daily and she takes it for     cervical disk disease.  2. Avalide 150/12.5 p.o. daily (dose unknown but says it was the same dose  as when she was here receiving GYN surgery in September of 2004.  3. Premarin 0.3 which she takes twice weekly, she is tapering it off.  4. Tri-Cor p.o. daily (dose unknown).  5. Ecotrin 325 mg p.o. daily.  6. Multivitamin p.o. daily.  7. Vitamin E 400 international units daily.  8. Vitamin B once daily.  9. Folic acid p.o. daily.   PAST MEDICAL HISTORY:  1. Significant for history of hypertension.  2. History of hyperlipidemia.  3. History of cervical disk disease.  4. History of colon polyps; she undergoes colonoscopy every 3 years, by Dr.     Ewing Schlein. I forgot to ask her when her last coloscopy was performed.   PAST SURGICAL HISTORY:  1. In September, 2004 she had anterior/posterior repair with sacrospinous     ligament for prolapse of the vaginal cuff (I believe) with cystocele and     rectocele.  2. Status post appendectomy years ago.  3. Status post total abdominal hysterectomy with bilateral salpingo-     oophorectomy secondary to prolapse at the age of 77.  4. She has also had hemorrhage surgery.   SOCIAL HISTORY:  She has been widowed for the past 4 years.  She has 3  children, 5 grandsons, 2 granddaughters.  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  She has not really  had much smoking.  No alcohol.  She exercised  regularly by walking.   FAMILY HISTORY:  She has 2 brothers and 1 sister.  Father and brother both  had prostate cancer; the father had metastases to the bone and died at age  80.  The brother died at age 70.  Myocardial infarction in the father in his  41's.  Alzheimer's in mother who died from complications of it at 56.  She  is unsure if there is diabetes in the family.  There is no hypertension, no  colon, breast or ovarian cancer.  No CVA.   REVIEW OF SYSTEMS:  As per HPI.  She otherwise denies a cough or cough that  will not go away, hemoptysis.  She did not some epigastric discomfort with  some nausea though not related to the pain that she had in the left  shoulder, but it has been in the same time period.  She is not having any  more of these symptoms at the present time.  She has not noticed any melena  or hematochezia, however, she did notice some blood after she had strained  at sometime last week in the commode and on wiping.  She denies any urinary  symptoms as well.   PHYSICAL EXAMINATION:  VITAL SIGNS:  Initial blood pressure was 163/90 and  repeat is 145/75, temperature is 96.9, pulse of 72, repeat is 68,  respiratory rate 18.  On her most recent vitals here saturations are 98% on  room air.  GENERAL:  She is in no apparent distress.  HEENT:  Is fairly unremarkable grossly.  LUNGS:  Clear to auscultation bilaterally without any crackles or wheezes.  HEART:  Regular rate and rhythm.  MUSCULOSKELETAL:  On the posterior back I could not elicit any muscle  tenderness or shoulder blade tenderness.  ABDOMEN:  Bowel sounds are normal, soft, nontender, no organomegaly and she  certainly is not tender in any of the quadrants.  NEURO:  There are no gross deficits.  EXTREMITIES:  No pretibial edema.   LABORATORY DATA:  Her EKG showed normal  sinus rhythm, she actually has T wave inversion in lead 3 and that is it.  Otherwise really not significantly  different from EKG in 2002  that was found on the charts here at [facility].   CBC was fairly unremarkable with a white count of 5.7 thousand, H&H of 13.6  and 41.1, platelet count of 326,000.  Sodium is 138, potassium 3.4, chloride  100, CO2 34, glucose 97, BUN 18, creatinine 1, calcium 9.4, total protein is  6.6, albumin of 4.1.  Her first set of cardiac enzymes showed a total CK of  72 with a troponin that was normal at 0.02.  Other 2 point of care markers were also fairly unremarkable with troponin  being less than 0.05, myoglobin was 73.4.  MB fraction of less than and she  has 2 point of care markers in 1 full cardiac panel.   A CT of the chest was done, do not think she had a regular chest x-ray but  CT of the chest was done to rule out a PE and that was negative, there were  no acute findings except for some mild peribronchial thickening.   ASSESSMENT/PLAN:  1. Atypical posterior chest pain, I think this is mostly musculoskeletal     pain.  As to whether there might be some suggestion coming from cervical     disk disease, it is certainly a possibility.  Since she is a     postmenopausal woman with some risk factor will go ahead and admit her     for a rule out myocardial infarction protocol.  Will get 2 more sets of     cardiac enzymes.  I actually expect her to rule out.  Will keep her on     her aspirin, will do Lovenox and Metoprolol in that time period.  I am     going to hold her Celebrex for the time being.  I believe this lady will     need a stress Cardiolite as an outpatient unless she rules in for a     myocardial infarction.  If the [facility] is going to seeing her     in the morning it is felt that we can get the stress Cardiolite while she     is here but there is no emergent need for her to get the stress     Cardiolate straight away, but she would certainly need that as an     outpatient.  2. In regards to her history of hypertension, will continue Avalide.  3. In regards to her  history of hyperlipidemia, I am going to use Zocor as     part of rule out myocardial infarction protocol, but she is normally on     Tricor at home.  4. In regards to her postmenopausal symptoms, she normally takes Premarin x2     weekly so there is really no need for [place] to give this while she is here.  5. Hold vitamin E while she is here but will continue her multivitamin.  6. In regards to GI prophylaxis, will place her on some Protonix.                                               Isla Pence, M.D.    RRV/MEDQ  D:  01/26/2004  T:  01/26/2004  Job:  [id number]   cc:   Dellis Anes. Idell Pickles, M.D.  [address]  [place]  [place] [address]  Fax: [phone]

## 2010-11-01 NOTE — H&P (Signed)
NAME:  Heather Floyd, Heather Floyd                           ACCOUNT NO.:  [id number]   MEDICAL RECORD NO.:  [id number]                   PATIENT TYPE:  OBV   LOCATION:  NA                                   FACILITY:  [facility]   PHYSICIAN:  Huel Cote, M.D.              DATE OF BIRTH:  [birth year]   DATE OF ADMISSION:  DATE OF DISCHARGE:                                HISTORY & PHYSICAL   PREOPERATIVE HISTORY AND PHYSICAL:   DATE/TIME OF SURGERY:  Surgery to take place on March 15, 2003 at 10:30  a.m.   BRIEF HISTORY:  The patient is a 77 year old female who first presented in  September 2004 with sudden onset of horrible pelvic pressure and nausea with  associated bladder symptoms of frequency.  The patient presented to her  primary doctor who diagnosed a possible urinary tract infection and  cystocele and referred her back to her gynecologist.  At that time the  patient was noted to have some pelvic prolapse more significantly  posteriorly than anteriorly but was too uncomfortable to adequately Valsalva  for complete assessment.  The patient since that time has had continued  problems with exercise and line dancing, has not been able to bear down on  command secondary to discomfort from this heavy pelvic pressure and  adamantly would like surgical intervention.   PAST MEDICAL HISTORY:  Her past medical history is significant for  borderline chronic hypertension and hypercholesterolemia.   PAST SURGICAL HISTORY:  Her past surgical history includes an appendectomy,  total abdominal hysterectomy, bilateral salpingo-oophorectomy and a breast  reduction.   PAST GYNECOLOGIC HISTORY:  She has had no abnormal Pap smears.   PAST OBSTETRICAL HISTORY:  Vaginal delivery x3.   FAMILY HISTORY:  No breast cancer or colon cancer with some heart disease in  her father.   CURRENT MEDICATIONS:  Her current medications include aspirin, vitamin E,  Avalide 150/12.5, Tricor 160 mg, Premarin (she is  currently weaning off and  is taking one tablet every other day to once a week) and she is on Celebrex  also.   PHYSICAL EXAMINATION:  VITAL SIGNS:  Blood pressure is 130/70, weight is 115  pounds.  Hemoglobin is 11.8.  BREAST:  Normal.  No masses, discharge, or adenopathy noted.  CARDIAC:  Regular rate and rhythm.  LUNGS:  Clear.  ABDOMEN:  Soft and nontender.  PELVIC:  The patient has a mild to moderate cystocele and a moderate  rectocele noted.  It is difficult to assess her cuff support given her  inability to Valsalva secondary to discomfort.  She is examined both lying  and standing.  The patient denies any significant continuation of urinary  symptoms, no stress urinary incontinence or urgency or frequency and indeed  her cystocele appears less significant than her rectocele.   ASSESSMENT AND PLAN:  The patient was sent for a preoperative CT scan given  some nonspecific symptoms of upper abdominal bloating and tenderness and  this was completely normal.  The patient was counseled to the risks and  benefits of procedure including bleeding and infection and possible damage  to bowel and bladder.  She also had discussed with her possibility of a  sacrospinous ligament fixation should the cuff appear inadequately supported  once she is under anesthesia and understands the risks associated with that  procedure as well.  The patient agrees to proceed with the surgery as  planned.  She is not currently sexually active however, would like to  maintain that option for the future.  I did discuss with her the possibility  of a sacrospinous fixation deviating the vagina to the left or right and  making sex not uncomfortable however, somewhat different in respects to the  angle of the vagina.  She understands these changes and risks and desires to  proceed with the surgery as stated.                                               Huel Cote, M.D.    KR/MEDQ  D:  03/14/2003  T:   03/14/2003  Job:  [id number]

## 2010-11-01 NOTE — Op Note (Signed)
Heather Floyd, SLABY NO.:  [id number]   MEDICAL RECORD NO.:  [id number]          PATIENT TYPE:  AMB   LOCATION:  ENDO                         FACILITY:  [facility]   PHYSICIAN:  Petra Kuba, M.D.    DATE OF BIRTH:  [birth year]   DATE OF PROCEDURE:  02/06/2005  DATE OF DISCHARGE:                                 OPERATIVE REPORT   PROCEDURE:  Colonoscopy.   INDICATIONS:  Patient with bright red blood per rectum, history of colon  polyps, one a carcinoid tumor.  Consent was signed after risks, benefits,  methods, options thoroughly discussed in the office.   MEDICINES USED:  Demerol 60 mg, Versed 6 mg.   PROCEDURE:  Rectal inspection was pertinent for small external hemorrhoids.  Digital exam was negative.  The video pediatric adjustable colonoscope was  inserted and with some difficulty due to tortuosity and looping probably  from adhesions, with abdominal pressure we were able to advance to the level  of the ileocecal valve.  Unfortunately, despite rolling her on her back and  rolling her on the right side, which only evaluate have the ileocecal valve  and cecal area, we could see the appendiceal orifice in the distance but  could not see the area underneath the valve.  When we advanced, we just fell  back or the patient experienced pain, so we elected to withdraw.  No blood  or obvious mass lesion was seen coming from below.  No abnormalities were  seen on insertion.  The scope was slowly withdrawn.  The prep was adequate.  There was minimal liquid stool that required washing and suctioning.  The  scope was slowly withdrawn back to the rectum.  No diverticula or signs of  bleeding or polyps were seen.  In the rectum, the previous polypectomy site  was seen with a nice white coagulum without any obvious residual polyp.  Anorectal pull-through and retroflexion confirmed some small hemorrhoids but  no signs of bleeding.  The scope was straightened, air was suctioned,  the  scope removed.  The patient tolerated the procedure fairly adequately.  There was no obvious immediate complication.   ENDOSCOPIC DIAGNOSES:  1.  Internal-external hemorrhoids, cause of bleeding.  2.  Otherwise within normal limits to one-half of the cecum being seen but      the area behind the valve was not seen.   PLAN:  Recheck colon screening in five years.  Might even consider a virtual  colonoscopy if widely available at that junction.  Will go ahead and treat  hemorrhoids but if her bleeding continues, probably to a surgeon p.r.n.  Happy to see back p.r.n.           ______________________________  Petra Kuba, M.D.     MEM/MEDQ  D:  02/06/2005  T:  02/07/2005  Job:  [id number]   cc:   Huel Cote, M.D.  [address] [place], [address]  [place], [place] [address]  Fax: [phone]

## 2010-11-01 NOTE — Op Note (Signed)
NAME:  Heather Floyd, Heather Floyd                           ACCOUNT NO.:  [id number]   MEDICAL RECORD NO.:  [id number]                   PATIENT TYPE:  OBV   LOCATION:  9399                                 FACILITY:  [facility]   PHYSICIAN:  Huel Cote, M.D.              DATE OF BIRTH:  [birth year]   DATE OF PROCEDURE:  03/15/2003  DATE OF DISCHARGE:                                 OPERATIVE REPORT   PREOPERATIVE DIAGNOSES:  1. Pelvic prolapse with vaginal cuff prolapse.  2. Cystocele.  3. Rectocele.   POSTOPERATIVE DIAGNOSES:  1. Pelvic prolapse with vaginal cuff prolapse.  2. Cystocele.  3. Rectocele.   PROCEDURE:  Anterior/posterior repair and a sacrospinous ligament  suspension.   SURGEON:  Huel Cote, M.D.   ASSISTANT:  Dr. Tawanna Cooler Mysinger.   ANESTHESIA:  General anesthesia.   ESTIMATED BLOOD LOSS:  100 mL.   FLUIDS:  Urine output 400 mL clear.  IV fluids 2000 mL.   DESCRIPTION OF PROCEDURE:  The patient was taken to the operating room where  general anesthesia was obtained without difficulty.  She was then prepped  and draped in the normal sterile fashion in the dorsal lithotomy position.  Examination under anesthesia was performed and there was slightly shortened  vagina noted with a moderate rectocele, mild cystocele and moderate vaginal  cuff prolapse.  At this point, weighted speculum was placed within the  vagina.  The anterior vaginal wall was grasped with Allis clamps at the  level of vaginal cuff and a small area of vaginal mucosa trimmed away.  The  vaginal mucosa was then underscored all along the vaginal mucosa to the  midline up to approximately 1 cm below the urethral meatus.  The underlying  pubovesical fascia was then trimmed off the wings of vaginal mucosa to the  midline and a small cystocele was reduced in the midline.  Several 0 Vicryl  sutures were placed in an interrupted fashion to close the fascial defect  and reduce the cystocele successfully and  the vaginal mucosa was closed with  2-0 Vicryl in running locked fashion.  Attention was then turned to the  posterior surface of the vagina where at the introitus, Allis clamps were  utilized to grasp vaginal mucosa.  A small V-shaped area of perineum was  trimmed away with scalpel and the vaginal mucosa was underscored in the  midline along the posterior wall of the vagina. This was taken up to  approximately 1 cm below the vaginal cuff and the vaginal mucosal flaps were  grasped with Allis clamps with the underlying rectovaginal fascia trimmed  off the flaps and reduced to the midline to reduce the rectocele fully.  The  perirectal space was then dissected on the patient's right down to the level  of the sacrospinous ligament with the ischial spine palpated and  sacrospinous ligament palpated.  It was grasped with an Allis clamp.  Two  sutures of one of 0 Prolene and one of 1-0 Prolene were placed in the  sacrospinous ligament itself and tested with good placement noted.  These  were then grasped in hemostat and the Allis clamp removed from sacrospinous  ligament itself.  The vaginal cuff was then sutured in the 1-0 Prolene  suture just underneath the mucosa and a pulley stitch created by knotting  this to the vaginal cuff.  The second 0 Prolene suture was also sutured just  underneath the mucosa and the vaginal cuff suspended.  These were not tied  down at the time and were held in hemostats.  The rectocele was then reduced  and several sutures of 0 Vicryl were placed in the fascial defect to close  over the rectocele and this was reduced successfully and excess vaginal  mucosa trimmed away.  The beginning of the mucosa was then closed at the  apex with 2-0 Vicryl in a running suture.  After several stitches were  placed, the sacrospinous fixation Prolene sutures were then tied down; first  the pulley stitch was tied down and the vaginal apex elevated and second,  the suspension suture  itself was successfully tied down to the sacrospinous  ligament with the vagina nicely suspended.  The excess suture was trimmed  away and the remaining vaginal mucosa was closed with 2-0 Vicryl in a  running fashion.  At the conclusion of the procedure, sponge, lap and needle  counts were correct x 2 and the patient's vagina was packed with plain gauze  soaked in Estrace and Estrace cream and she was taken to the recovery room  in stable condition.                                               Huel Cote, M.D.    KR/MEDQ  D:  03/15/2003  T:  03/15/2003  Job:  [id number]

## 2010-11-27 ENCOUNTER — Other Ambulatory Visit (INDEPENDENT_AMBULATORY_CARE_PROVIDER_SITE_OTHER): Payer: Medicare Other | Admitting: Vascular Surgery

## 2010-11-27 DIAGNOSIS — I83893 Varicose veins of bilateral lower extremities with other complications: Secondary | ICD-10-CM

## 2010-11-27 NOTE — Assessment & Plan Note (Signed)
OFFICE VISIT  Heather Floyd, Heather Floyd DOB:  1932-10-05                                       11/27/2010 [id number]#:[id number]  Patient presents today for closure of her left great saphenous vein. 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  She had no immediate complication and will be seen again in 1 week with repeat follow-up.  We will continue to observe her tributary branches.  She may require a stab phlebectomy in the future should she continue to have discomfort over these despite ablation of her great saphenous vein.    Larina Earthly, M.D. Electronically Signed  TFE/MEDQ  D:  11/27/2010  T:  11/27/2010  Job:  [id number]

## 2010-12-03 ENCOUNTER — Encounter (INDEPENDENT_AMBULATORY_CARE_PROVIDER_SITE_OTHER): Payer: Medicare Other

## 2010-12-03 ENCOUNTER — Ambulatory Visit (INDEPENDENT_AMBULATORY_CARE_PROVIDER_SITE_OTHER): Payer: Medicare Other | Admitting: Vascular Surgery

## 2010-12-03 DIAGNOSIS — I83893 Varicose veins of bilateral lower extremities with other complications: Secondary | ICD-10-CM

## 2010-12-03 DIAGNOSIS — Z48812 Encounter for surgical aftercare following surgery on the circulatory system: Secondary | ICD-10-CM

## 2010-12-04 NOTE — Assessment & Plan Note (Signed)
OFFICE VISIT  [name], [name] DOB:  04-04-1933                                       12/03/2010 EAVWU#:[id number]  The patient presents today for one-week followup of ablation of her left great saphenous vein.  This fed into a large tributary varices over the anterior thigh extending down on her pretibial area.  She has the usual amount of bruising and discomfort following ablation and this is continuing to resolve.  She has been compliant with her compression garment.  She does have decompression of the thigh portion of her varices.  She underwent repeat venous duplex in our office today and this reveals no evidence of period of and ablation of her saphenous vein.  I am pleased with her initial result.  We will see her again in 3 months to determine if she has had successful treatment or requires stab phlebectomy of these tributary varicosities.    Larina Earthly, M.D. Electronically Signed  TFE/MEDQ  D:  12/03/2010  T:  12/04/2010  Job:  [id number]  cc:   Sigmund Hazel, M.D.

## 2010-12-16 NOTE — Procedures (Unsigned)
DUPLEX DEEP VENOUS EXAM - LOWER EXTREMITY  INDICATION:  Follow up left greater saphenous vein ablation.  HISTORY:  Edema:  No. Trauma/Surgery:  Status post left greater saphenous vein ablation, 11/27/2010. Pain:  Yes. PE:  No. Previous DVT:  No. Anticoagulants:  No. Other:  DUPLEX EXAM:               CFV   SFV   PopV  PTV    GSV               R  L  R  L  R  L  R   L  R  L Thrombosis    o  o     o     o      o     + Spontaneous   +  +     +     +      +     o Phasic        +  +     +     +      +     o Augmentation  +  +     +     +      +     o Compressible  +  +     +     +      +     + Competent     +  +     +     +      +     +  Legend:  + - yes  o - no  p - partial  D - decreased  IMPRESSION:  No evidence of acute deep venous thrombosis within the left lower extremity.  Successful ablation from the saphenofemoral junction to the distal insertion site.  The left greater saphenous vein is compressible from the mid thigh distally, although there is no filling of the vessel with normal respirations; however, there is filling with calf augmentation at distal thigh.   _____________________________ Larina Earthly, M.D.  OD/MEDQ  D:  12/03/2010  T:  12/03/2010  Job:  [id number]

## 2011-02-04 ENCOUNTER — Encounter: Payer: Self-pay | Admitting: Vascular Surgery

## 2011-02-06 ENCOUNTER — Encounter: Payer: Self-pay | Admitting: Vascular Surgery

## 2011-03-04 ENCOUNTER — Encounter: Payer: Self-pay | Admitting: Vascular Surgery

## 2011-03-05 ENCOUNTER — Ambulatory Visit (INDEPENDENT_AMBULATORY_CARE_PROVIDER_SITE_OTHER): Payer: Medicare Other | Admitting: Vascular Surgery

## 2011-03-05 ENCOUNTER — Encounter: Payer: Self-pay | Admitting: Vascular Surgery

## 2011-03-05 VITALS — BP 178/79 | HR 66 | Resp 20 | Ht 62.0 in | Wt 108.0 lb

## 2011-03-05 DIAGNOSIS — I83893 Varicose veins of bilateral lower extremities with other complications: Secondary | ICD-10-CM

## 2011-03-05 LAB — DIFFERENTIAL
Basophils Relative: 0
Basophils Relative: 1
Eosinophils Absolute: 0.1
Lymphs Abs: 1.4
Monocytes Absolute: 1
Monocytes Relative: 14 — ABNORMAL HIGH
Monocytes Relative: 17 — ABNORMAL HIGH
Neutro Abs: 1.9
Neutrophils Relative %: 47
Neutrophils Relative %: 58

## 2011-03-05 LAB — BASIC METABOLIC PANEL
CO2: 26
Calcium: 7.7 — ABNORMAL LOW
Calcium: 8 — ABNORMAL LOW
Calcium: 8.3 — ABNORMAL LOW
Chloride: 107
Creatinine, Ser: 0.68
Creatinine, Ser: 0.77
Creatinine, Ser: 0.78
Creatinine, Ser: 0.85
GFR calc Af Amer: >60
GFR calc Af Amer: >60
GFR calc Af Amer: >60
GFR calc non Af Amer: >60
GFR calc non Af Amer: >60
GFR calc non Af Amer: >60
Glucose, Bld: 102 — ABNORMAL HIGH
Glucose, Bld: 99
Sodium: 141
Sodium: 142

## 2011-03-05 LAB — CBC
Hemoglobin: 11.1 — ABNORMAL LOW
MCHC: 35.2
MCHC: 35.7
MCV: 91.1
Platelets: 237
RBC: 3.35 — ABNORMAL LOW
RBC: 3.56 — ABNORMAL LOW
RDW: 12.5
WBC: 4.1

## 2011-03-05 LAB — RETICULOCYTES: Retic Count, Absolute: 32.5

## 2011-03-05 LAB — URINALYSIS, ROUTINE W REFLEX MICROSCOPIC
Bilirubin Urine: NEGATIVE
Protein, ur: NEGATIVE
Specific Gravity, Urine: 1.024
Urobilinogen, UA: 1

## 2011-03-05 LAB — CARDIAC PANEL(CRET KIN+CKTOT+MB+TROPI)
CK, MB: 0.9
Relative Index: INVALID
Relative Index: INVALID
Total CK: 31
Total CK: 39
Troponin I: 0.01
Troponin I: 0.02

## 2011-03-05 LAB — HEPATIC FUNCTION PANEL
ALT: 28
Alkaline Phosphatase: 35 — ABNORMAL LOW
Bilirubin, Direct: 0.2
Indirect Bilirubin: 0.8
Total Bilirubin: 1

## 2011-03-05 LAB — IRON AND TIBC
Iron: 37 — ABNORMAL LOW
Saturation Ratios: 13 — ABNORMAL LOW
TIBC: 289

## 2011-03-05 LAB — CK TOTAL AND CKMB (NOT AT ARMC): Relative Index: INVALID

## 2011-03-05 LAB — FOLATE: Folate: >20

## 2011-03-05 NOTE — Progress Notes (Addendum)
Problems with Activities of Daily Living Secondary to Leg Pain  1. 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   2. Heather Floyd states that any activities that require prolonged standing such as cooking, cleaning, and shopping are very difficult due to leg pain.  Rankin, Neena Rhymes   Failure of  Conservative Therapy:  1. Worn 20-30 mm Hg thigh high compression hose >3 months with no relief of symptoms.  2. Frequently elevates legs-no relief of symptoms  3. Taken Ibuprofen 600 Mg TID with no relief of symptoms.  Patient states the pain in her tributary varicosities continues despite ablation of her great saphenous vein. Both inner thigh and her pretibial area. She continues to have marked varicosities in the pretibial area. There is some slight decreased size of her thigh varicosities. She has  additional conservative treatment after her ablation with the addition of 3 months of compression I recommend that we proceed with stab phlebectomy of her thigh and pretibial varicosities. She understands this is under local anesthetic and we will schedule this at her convenience.

## 2011-03-28 ENCOUNTER — Encounter: Payer: Self-pay | Admitting: Vascular Surgery

## 2011-04-23 ENCOUNTER — Encounter: Payer: Self-pay | Admitting: Vascular Surgery

## 2011-04-24 ENCOUNTER — Encounter: Payer: Self-pay | Admitting: Vascular Surgery

## 2011-04-24 ENCOUNTER — Ambulatory Visit (INDEPENDENT_AMBULATORY_CARE_PROVIDER_SITE_OTHER): Payer: Medicare Other | Admitting: Vascular Surgery

## 2011-04-24 VITALS — BP 171/83 | HR 105 | Resp 18 | Ht 62.0 in | Wt 103.0 lb

## 2011-04-24 DIAGNOSIS — I83893 Varicose veins of bilateral lower extremities with other complications: Secondary | ICD-10-CM

## 2011-04-24 NOTE — Progress Notes (Signed)
Laser Ablation Procedure      Date: 04/24/2011    Jilda Panda DOB:Jan 03, 1933  Consent signed: Yes  Surgeon:T.F. Olusegun Gerstenberger  Procedure:  BP 171/83  Pulse 105  Resp 18  Ht 5\' 2"  (1.575 m)  Wt 103 lb (46.72 kg)  BMI 18.84 kg/m2  Start time: 8:30AM   End time: 9:30AM  Tumescent Anesthesia: 250 cc 0.9% NaCl with 50 cc Lidocaine HCL with 1% Epi and 15 cc 8.4% NaHCO3  Local Anesthesia: 1 cc Lidocaine HCL and NaHCO3 (ratio 2:1)       Stab Phlebectomy: 10-20 Sites: Thigh and Calf   Left leg  Patient tolerated procedure well: Yes   Rankin, Neena Rhymes  The patient was then put into Trendelenburg position.  Local anesthetic was utilized overlying the marked varicosities.  Greater than 10-20 stab wounds were made using the tip of an 11 blade; and using the vein hook,  The phlebectomies were performed using a hemostat to avulse these varicosities.  Adequate hemostasis was achieved, and steri strips were applied to the stab wound.     ABD pads and thigh high compression stockings were applied.  Ace wrap bandages were applied over the phlebectomy sites .  Blood loss was less than 15 cc.  The patient ambulated out of the operating room having tolerated the procedure well.  Successful treatment of thigh and pretibial varicosites.  F/u in two weeks

## 2011-04-25 ENCOUNTER — Telehealth: Payer: Self-pay | Admitting: *Deleted

## 2011-04-25 NOTE — Telephone Encounter (Signed)
Heather Floyd is complaining of moderate pain over stab phlebectomy sites left leg.  Reminded her to use Ibuprofen 400-600 mg with food prn pain and to apply cold compresses to left leg.  No complaints of swelling, bleeding/oozing.  Reminded her of post procedural instructions and her follow up appointment with Dr. Arbie Cookey on 05-15-2011.  Demitrios Molyneux, Neena Rhymes

## 2011-05-14 ENCOUNTER — Encounter: Payer: Self-pay | Admitting: Vascular Surgery

## 2011-05-15 ENCOUNTER — Encounter: Payer: Self-pay | Admitting: Vascular Surgery

## 2011-05-15 ENCOUNTER — Ambulatory Visit (INDEPENDENT_AMBULATORY_CARE_PROVIDER_SITE_OTHER): Payer: Medicare Other | Admitting: Vascular Surgery

## 2011-05-15 VITALS — BP 149/70 | HR 97 | Resp 18 | Ht 62.0 in | Wt 103.0 lb

## 2011-05-15 DIAGNOSIS — I83893 Varicose veins of bilateral lower extremities with other complications: Secondary | ICD-10-CM

## 2011-05-15 NOTE — Progress Notes (Signed)
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 He has minimal discomfort later this has resolved over bruising. The procedure was on 04/24/2011. He is returned her usual preoperative baseline activity which is extremely active and will continue this she will see [place] again on an as-needed basis

## 2011-05-26 ENCOUNTER — Encounter: Payer: Self-pay | Admitting: Vascular Surgery

## 2011-06-02 ENCOUNTER — Telehealth: Payer: Self-pay | Admitting: *Deleted

## 2011-06-02 NOTE — Telephone Encounter (Signed)
Patient called complaining of what sounds like pain near a stab phlebectomy site. She wondered what she could do to relieve the pain. I suggested a heating pad over the area and to take Ibuprofen 400 mg q 4-6h. Patient denied hard area, warm, red; just painful where there had been no pain since the procedure performed by Dr. Arbie Cookey. Asked her to do the above for several days and to call me if she worsend. She denies sweeling and pain of her foot and ankle. Will follow prn.

## 2012-03-19 ENCOUNTER — Emergency Department (HOSPITAL_COMMUNITY): Payer: Medicare Other

## 2012-03-19 ENCOUNTER — Encounter (HOSPITAL_COMMUNITY): Payer: Self-pay

## 2012-03-19 ENCOUNTER — Emergency Department (HOSPITAL_COMMUNITY)
Admission: EM | Admit: 2012-03-19 | Discharge: 2012-03-19 | Disposition: A | Discharge status: Home / Self Care | Payer: Medicare Other | Attending: Emergency Medicine | Admitting: Emergency Medicine

## 2012-03-19 DIAGNOSIS — S0003XA Contusion of scalp, initial encounter: Secondary | ICD-10-CM | POA: Insufficient documentation

## 2012-03-19 DIAGNOSIS — M25439 Effusion, unspecified wrist: Secondary | ICD-10-CM | POA: Insufficient documentation

## 2012-03-19 DIAGNOSIS — M7989 Other specified soft tissue disorders: Secondary | ICD-10-CM | POA: Insufficient documentation

## 2012-03-19 DIAGNOSIS — Z8673 Personal history of transient ischemic attack (TIA), and cerebral infarction without residual deficits: Secondary | ICD-10-CM | POA: Insufficient documentation

## 2012-03-19 DIAGNOSIS — F411 Generalized anxiety disorder: Secondary | ICD-10-CM | POA: Insufficient documentation

## 2012-03-19 DIAGNOSIS — S0990XA Unspecified injury of head, initial encounter: Secondary | ICD-10-CM | POA: Insufficient documentation

## 2012-03-19 DIAGNOSIS — I10 Essential (primary) hypertension: Secondary | ICD-10-CM | POA: Insufficient documentation

## 2012-03-19 DIAGNOSIS — E785 Hyperlipidemia, unspecified: Secondary | ICD-10-CM | POA: Insufficient documentation

## 2012-03-19 DIAGNOSIS — IMO0001 Reserved for inherently not codable concepts without codable children: Secondary | ICD-10-CM | POA: Insufficient documentation

## 2012-03-19 DIAGNOSIS — M79609 Pain in unspecified limb: Secondary | ICD-10-CM | POA: Insufficient documentation

## 2012-03-19 DIAGNOSIS — S62109A Fracture of unspecified carpal bone, unspecified wrist, initial encounter for closed fracture: Secondary | ICD-10-CM | POA: Insufficient documentation

## 2012-03-19 DIAGNOSIS — M25539 Pain in unspecified wrist: Secondary | ICD-10-CM | POA: Insufficient documentation

## 2012-03-19 DIAGNOSIS — R51 Headache: Secondary | ICD-10-CM | POA: Insufficient documentation

## 2012-03-19 DIAGNOSIS — IMO0002 Reserved for concepts with insufficient information to code with codable children: Secondary | ICD-10-CM | POA: Insufficient documentation

## 2012-03-19 DIAGNOSIS — Z79899 Other long term (current) drug therapy: Secondary | ICD-10-CM | POA: Insufficient documentation

## 2012-03-19 DIAGNOSIS — Z7982 Long term (current) use of aspirin: Secondary | ICD-10-CM | POA: Insufficient documentation

## 2012-03-19 DIAGNOSIS — M810 Age-related osteoporosis without current pathological fracture: Secondary | ICD-10-CM | POA: Insufficient documentation

## 2012-03-19 DIAGNOSIS — S7000XA Contusion of unspecified hip, initial encounter: Secondary | ICD-10-CM | POA: Insufficient documentation

## 2012-03-19 DIAGNOSIS — W010XXA Fall on same level from slipping, tripping and stumbling without subsequent striking against object, initial encounter: Secondary | ICD-10-CM | POA: Insufficient documentation

## 2012-03-19 MED ORDER — OXYCODONE-ACETAMINOPHEN 5-325 MG PO TABS: 1.0000 | ORAL_TABLET | ORAL | Status: DC | PRN

## 2012-03-19 MED ORDER — MORPHINE SULFATE 4 MG/ML IJ SOLN
4.0000 mg | Freq: Once | INTRAMUSCULAR | Status: AC
  Administered 2012-03-19: 4 mg via INTRAMUSCULAR
  Filled 2012-03-19: qty 1

## 2012-03-19 MED ORDER — OXYCODONE-ACETAMINOPHEN 5-325 MG PO TABS
2.0000 | ORAL_TABLET | Freq: Once | ORAL | Status: DC
  Filled 2012-03-19: qty 2

## 2012-03-19 MED ORDER — DOCUSATE SODIUM 100 MG PO CAPS: 100.0000 mg | ORAL_CAPSULE | Freq: Two times a day (BID) | ORAL | Status: DC

## 2012-03-19 NOTE — ED Notes (Signed)
Pt was walking her dog and fell backward hit the back of her head, hematoma noted on back of head, also c/o tail bone pain, no skin injury, no nausea. Pt is alert, oriented

## 2012-03-19 NOTE — ED Notes (Signed)
Pt didn't loss her consciousness during the fall, takes asa 324 mg daily, PA Joni Reining has been informed and will come to evaluate this patient.

## 2012-03-19 NOTE — ED Provider Notes (Signed)
History    78 year old female presenting after a fall. Patient was out walking her dog when she lost her balance and fell backwards. She struck the back of her head. No loss of consciousness. Patient is complaining of a headache, pain in her left buttock and in her left hand and wrist. Patient is on aspirin otherwise no blood thinning medication. No confusion per family. Patient was able to walk home after she fell.  CSN: [id number]  Arrival date & time 03/19/12  1716   First MD Initiated Contact with Patient 03/19/12 1855      Chief Complaint  Patient presents with  . Fall    (Consider location/radiation/quality/duration/timing/severity/associated sxs/prior treatment) HPI  Past Medical History  Diagnosis Date  . Hyperlipidemia   . Hypertension   . RLS (restless legs syndrome)   . Sleep disorder   . Personal history of colonic polyps   . Osteoporosis   . DJD (degenerative joint disease) of cervical spine   . Varicose veins   . TIA (transient ischemic attack)   . Carotid artery occlusion     Past Surgical History  Procedure Date  . Anterior and posterior vaginal repair w/ sacrospinous ligament suspension   . Endovenous ablation saphenous vein w/ laser     Family History  Problem Relation Age of Onset  . Mental illness Mother   . Cancer Father     History  Substance Use Topics  . Smoking status: Former Smoker    Quit date: 06/16/1980  . Smokeless tobacco: Never Used  . Alcohol Use: No    OB History    Grav Para Term Preterm Abortions TAB SAB Ect Mult Living                  Review of Systems   Review of symptoms negative unless otherwise noted in HPI.   Allergies  Codeine  Home Medications   Current Outpatient Rx  Name Route Sig Dispense Refill  . ASPIRIN 325 MG PO TABS Oral Take 325 mg by mouth daily.      Marland Kitchen VITAMIN D 1000 UNITS PO TABS Oral Take 1,000 Units by mouth daily.      . CYANOCOBALAMIN 500 MCG PO TABS Oral Take 500 mcg by mouth daily.       . IBUPROFEN 200 MG PO TABS Oral Take 200 mg by mouth every 6 (six) hours as needed.      Marland Kitchen LISINOPRIL 20 MG PO TABS Oral Take 20 mg by mouth daily.      Marland Kitchen DOCUSATE SODIUM 100 MG PO CAPS Oral Take 1 capsule (100 mg total) by mouth every 12 (twelve) hours. 20 capsule 0  . OXYCODONE-ACETAMINOPHEN 5-325 MG PO TABS Oral Take 1 tablet by mouth every 4 (four) hours as needed for pain. 12 tablet 0    BP 169/90  Pulse 84  Temp 97.9 F (36.6 C) (Oral)  Resp 16  Wt 125 lb (56.7 kg)  SpO2 96%  Physical Exam  Nursing note and vitals reviewed. Constitutional: She appears well-developed and well-nourished. No distress.       Laying in bed. Uncomfortable and anxious appearing.  HENT:  Head: Normocephalic.       Hematoma to the posterior scalp with an overlying abrasion. No active bleeding.  Eyes: Conjunctivae normal and EOM are normal. Pupils are equal, round, and reactive to light. Right eye exhibits no discharge. Left eye exhibits no discharge.  Neck: Neck supple.  Cardiovascular: Normal rate, regular rhythm and normal heart  sounds.  Exam reveals no gallop and no friction rub.   No murmur heard. Pulmonary/Chest: Effort normal and breath sounds normal. No respiratory distress.  Abdominal: Soft. She exhibits no distension. There is no tenderness.  Musculoskeletal: She exhibits no edema and no tenderness.       No midline spinal tenderness. Mild swelling of the proximal L hand and distal wrist. Patient with diffuse tenderness in the area of the carpal bones included in anatomic snuffbox. Overlying skin is intact. Neurovascularly intact distally.  Neurological: She is alert.  Skin: Skin is warm and dry.  Psychiatric: Thought content normal.       Anxious    ED Course  Procedures (including critical care time)  Labs Reviewed - No data to display Dg Pelvis 1-2 Views  03/19/2012  *RADIOLOGY REPORT*  Clinical Data: Left hip and pelvic pain.  The patient fell.  PELVIS - 1-2 VIEW  Comparison: CT  of the abdomen and pelvis 01/06/2009CT of the abdomen and pelvis 06/21/2007 the  Findings: There are degenerative changes in the SI joints and lower spine.  No evidence for acute fracture or subluxation.  Regional bowel gas pattern is nonobstructive.  IMPRESSION: No evidence for acute  abnormality.   Original Report Authenticated By: Patterson Hammersmith, M.D.    Dg Wrist Complete Left  03/19/2012  *RADIOLOGY REPORT*  Clinical Data: Fall.  Pain and swelling.  LEFT WRIST - COMPLETE 3+ VIEW  Comparison: None.  Findings: There is diffuse soft tissue swelling at the wrist. Bones appear radiolucent.  There is significant degenerative change involving the radial aspect of the carpals and the first carpometacarpal joint.  On the lateral view, there is mild irregularity along the dorsum of the carpus, raising question of triquetral fracture.  Correlation with point tenderness is recommended.  IMPRESSION:  1.  Suspect osteopenia/osteoporosis. 2.  Significant degenerative changes along the radial aspect of the wrist and first carpometacarpal joint. 3.  Question of triquetral fracture.   Original Report Authenticated By: Patterson Hammersmith, M.D.    Ct Head Wo Contrast  03/19/2012  *RADIOLOGY REPORT*  Clinical Data:  Fall with head injury.  CT HEAD WITHOUT CONTRAST CT CERVICAL SPINE WITHOUT CONTRAST  Technique:  Multidetector CT imaging of the head and cervical spine was performed following the standard protocol without intravenous contrast.  Multiplanar CT image reconstructions of the cervical spine were also generated.  Comparison:  06/08/2010  CT HEAD  Findings: Dilated perivascular space is noted along the lentiform nuclei.  The brain stem, cerebellum, cerebral peduncles, thalami, and ventricular system appear normal  No intracranial hemorrhage, mass lesion, or acute infarction is identified.  Left parietal scalp hematoma noted.  IMPRESSION:  1.  Left parietal scalp hematoma. 2.  No acute intracranial findings.  CT  CERVICAL SPINE  Findings: Facet and interlaminar fusion noted at C2-3. Degenerative facet arthropathy is present at C3-4, C4-5, and C5-6, left greater than right.  There is 2 mm of degenerative anterior subluxation of C4 on C5.  Loss of intervertebral disc height noted with mild uncinate spurring at the C5-6 and C6-7 levels, with mild osseous foraminal stenosis on the left at C5-6.  IMPRESSION:  1.  Cervical spondylosis and degenerative disc disease, without fracture or acute subluxation observed.   Original Report Authenticated By: Dellia Cloud, M.D.    Ct Cervical Spine Wo Contrast  03/19/2012  *RADIOLOGY REPORT*  Clinical Data:  Fall with head injury.  CT HEAD WITHOUT CONTRAST CT CERVICAL SPINE WITHOUT CONTRAST  Technique:  Multidetector CT imaging of the head and cervical spine was performed following the standard protocol without intravenous contrast.  Multiplanar CT image reconstructions of the cervical spine were also generated.  Comparison:  06/08/2010  CT HEAD  Findings: Dilated perivascular space is noted along the lentiform nuclei.  The brain stem, cerebellum, cerebral peduncles, thalami, and ventricular system appear normal  No intracranial hemorrhage, mass lesion, or acute infarction is identified.  Left parietal scalp hematoma noted.  IMPRESSION:  1.  Left parietal scalp hematoma. 2.  No acute intracranial findings.  CT CERVICAL SPINE  Findings: Facet and interlaminar fusion noted at C2-3. Degenerative facet arthropathy is present at C3-4, C4-5, and C5-6, left greater than right.  There is 2 mm of degenerative anterior subluxation of C4 on C5.  Loss of intervertebral disc height noted with mild uncinate spurring at the C5-6 and C6-7 levels, with mild osseous foraminal stenosis on the left at C5-6.  IMPRESSION:  1.  Cervical spondylosis and degenerative disc disease, without fracture or acute subluxation observed.   Original Report Authenticated By: Dellia Cloud, M.D.      1. Hand  fracture - carpal bone   2. Closed head injury   3. Scalp hematoma   4. Contusion, hip       MDM  78 year old female with multiple complaints after mechanical fall. Patient has a scalp hematoma but CT of the head distention or any acute intracranial abnormality or skull fracture. She's a nonfocal neurological examination. She is at her baseline mental status per her family at bedside. Imaging of her hans with questionable triquetral fracture. This does correlate to area of patient's tenderness. She does have some tenderness in anatomic snuffbox as well. NVI distally. Will splint. Hand surgical followup. As needed pain medication. Return precautions were discussed.       Raeford Razor, MD 03/19/12 2057

## 2013-01-12 ENCOUNTER — Encounter (HOSPITAL_BASED_OUTPATIENT_CLINIC_OR_DEPARTMENT_OTHER): Payer: Self-pay

## 2013-01-12 ENCOUNTER — Observation Stay (HOSPITAL_COMMUNITY)
Admission: EM | Admit: 2013-01-12 | Discharge: 2013-01-13 | Disposition: A | Discharge status: Home / Self Care | Payer: Medicare Other | Attending: Emergency Medicine | Admitting: Emergency Medicine

## 2013-01-12 ENCOUNTER — Emergency Department (HOSPITAL_BASED_OUTPATIENT_CLINIC_OR_DEPARTMENT_OTHER): Payer: Medicare Other

## 2013-01-12 DIAGNOSIS — R55 Syncope and collapse: Principal | ICD-10-CM | POA: Insufficient documentation

## 2013-01-12 DIAGNOSIS — H8110 Benign paroxysmal vertigo, unspecified ear: Secondary | ICD-10-CM | POA: Insufficient documentation

## 2013-01-12 DIAGNOSIS — Z79899 Other long term (current) drug therapy: Secondary | ICD-10-CM | POA: Insufficient documentation

## 2013-01-12 DIAGNOSIS — I10 Essential (primary) hypertension: Secondary | ICD-10-CM | POA: Insufficient documentation

## 2013-01-12 DIAGNOSIS — E875 Hyperkalemia: Secondary | ICD-10-CM | POA: Insufficient documentation

## 2013-01-12 LAB — URINALYSIS, ROUTINE W REFLEX MICROSCOPIC
Ketones, ur: NEGATIVE mg/dL
Nitrite: NEGATIVE
Protein, ur: NEGATIVE mg/dL
pH: 7.5 (ref 5.0–8.0)

## 2013-01-12 LAB — CBC WITH DIFFERENTIAL/PLATELET
Basophils Absolute: 0 10*3/uL (ref 0.0–0.1)
Basophils Relative: 1 % (ref 0–1)
Eosinophils Relative: 3 % (ref 0–5)
HCT: 37.3 % (ref 36.0–46.0)
Hemoglobin: 13.4 g/dL (ref 12.0–15.0)
MCH: 31.9 pg (ref 26.0–34.0)
MCHC: 35.9 g/dL (ref 30.0–36.0)
MCV: 88.8 fL (ref 78.0–100.0)
Monocytes Absolute: 0.7 10*3/uL (ref 0.1–1.0)
Monocytes Relative: 10 % (ref 3–12)
Neutro Abs: 4.3 10*3/uL (ref 1.7–7.7)
RDW: 13.1 % (ref 11.5–15.5)

## 2013-01-12 LAB — COMPREHENSIVE METABOLIC PANEL
AST: 24 U/L (ref 0–37)
Albumin: 4.1 g/dL (ref 3.5–5.2)
BUN: 9 mg/dL (ref 6–23)
Calcium: 10.3 mg/dL (ref 8.4–10.5)
Chloride: 103 mEq/L (ref 96–112)
Creatinine, Ser: 0.9 mg/dL (ref 0.50–1.10)
GFR calc non Af Amer: 60 mL/min — ABNORMAL LOW (ref >90)
Total Bilirubin: 0.4 mg/dL (ref 0.3–1.2)

## 2013-01-12 LAB — URINE MICROSCOPIC-ADD ON

## 2013-01-12 LAB — TROPONIN I: Troponin I: <0.3 ng/mL (ref <0.30)

## 2013-01-12 MED ORDER — SODIUM CHLORIDE 0.9 % IV BOLUS (SEPSIS)
1000.0000 mL | Freq: Once | INTRAVENOUS | Status: AC
  Administered 2013-01-12: 1000 mL via INTRAVENOUS

## 2013-01-12 MED ORDER — POTASSIUM CHLORIDE CRYS ER 20 MEQ PO TBCR
60.0000 meq | EXTENDED_RELEASE_TABLET | Freq: Once | ORAL | Status: AC
  Administered 2013-01-12: 60 meq via ORAL
  Filled 2013-01-12: qty 3

## 2013-01-12 NOTE — ED Provider Notes (Addendum)
CSN: [id number]     Arrival date & time 01/12/13  1946 History     First MD Initiated Contact with Patient 01/12/13 2041     Chief Complaint  Patient presents with  . Loss of Consciousness   (Consider location/radiation/quality/duration/timing/severity/associated sxs/prior Treatment) Patient is a 79 y.o. female presenting with syncope. The history is provided by the patient and a relative.  Loss of Consciousness Episode history:  Multiple Most recent episode:  Today Timing:  Intermittent Progression:  Resolved Chronicity:  New Context: inactivity, sitting down and standing up   Context: not exertion and not sight of blood   Context comment:  Initial episode occurred yesterday when she was standing outside petting the dog. She states at one minute she was petting the dog in the neck she was on the ground. She had no program of symptoms. Witnessed: yes   Relieved by:  Nothing Worsened by:  Nothing tried Ineffective treatments:  None tried Associated symptoms: no chest pain, no confusion, no diaphoresis, no difficulty breathing, no fever, no focal sensory loss, no focal weakness, no headaches, no palpitations, no recent injury, no shortness of breath, no visual change, no vomiting and no weakness   Associated symptoms comment:  Today prior to patient's syncopal episode she started to feel the room to continue she was going to pass out. Risk factors: no congenital heart disease, no coronary artery disease and no seizures     Past Medical History  Diagnosis Date  . Hyperlipidemia   . Hypertension   . RLS (restless legs syndrome)   . Sleep disorder   . Personal history of colonic polyps   . Osteoporosis   . DJD (degenerative joint disease) of cervical spine   . Varicose veins   . TIA (transient ischemic attack)   . Carotid artery occlusion    Past Surgical History  Procedure Laterality Date  . Anterior and posterior vaginal repair w/ sacrospinous ligament suspension    .  Endovenous ablation saphenous vein w/ laser     Family History  Problem Relation Age of Onset  . Mental illness Mother   . Cancer Father    History  Substance Use Topics  . Smoking status: Former Smoker    Quit date: 06/16/1980  . Smokeless tobacco: Never Used  . Alcohol Use: No   OB History   Grav Para Term Preterm Abortions TAB SAB Ect Mult Living                 Review of Systems  Constitutional: Negative for fever and diaphoresis.  Respiratory: Negative for shortness of breath.   Cardiovascular: Positive for syncope. Negative for chest pain and palpitations.  Gastrointestinal: Negative for vomiting.  Neurological: Negative for focal weakness, weakness and headaches.  Psychiatric/Behavioral: Negative for confusion.  All other systems reviewed and are negative.    Allergies  Codeine  Home Medications   Current Outpatient Rx  Name  Route  Sig  Dispense  Refill  . FENOFIBRATE PO   Oral   Take by mouth.         Marland Kitchen SIMVASTATIN PO   Oral   Take by mouth.         Marland Kitchen aspirin 325 MG tablet   Oral   Take 325 mg by mouth daily.           . cholecalciferol (VITAMIN D) 1000 UNITS tablet   Oral   Take 1,000 Units by mouth daily.           Marland Kitchen  cyanocobalamin 500 MCG tablet   Oral   Take 500 mcg by mouth daily.          Marland Kitchen docusate sodium (COLACE) 100 MG capsule   Oral   Take 1 capsule (100 mg total) by mouth every 12 (twelve) hours.   20 capsule   0   . ibuprofen (ADVIL,MOTRIN) 200 MG tablet   Oral   Take 200 mg by mouth every 6 (six) hours as needed.           Marland Kitchen lisinopril (PRINIVIL,ZESTRIL) 20 MG tablet   Oral   Take 20 mg by mouth daily.           Marland Kitchen oxyCODONE-acetaminophen (PERCOCET/ROXICET) 5-325 MG per tablet   Oral   Take 1 tablet by mouth every 4 (four) hours as needed for pain.   12 tablet   0    BP 177/65  Pulse 74  Temp(Src) 98.3 F (36.8 C) (Oral)  Resp 20  Ht 5\' 2"  (1.575 m)  Wt 125 lb (56.7 kg)  BMI 22.86 kg/m2  SpO2  96% Physical Exam  Nursing note and vitals reviewed. Constitutional: She is oriented to person, place, and time. She appears well-developed and well-nourished. No distress.  HENT:  Head: Normocephalic and atraumatic.  Mouth/Throat: Oropharynx is clear and moist.  Eyes: Conjunctivae and EOM are normal. Pupils are equal, round, and reactive to light.  Neck: Normal range of motion. Neck supple.  Cardiovascular: Normal rate, regular rhythm and intact distal pulses.   No murmur heard. Pulmonary/Chest: Effort normal and breath sounds normal. No respiratory distress. She has no wheezes. She has no rales.  Abdominal: Soft. She exhibits no distension. There is no tenderness. There is no rebound and no guarding.  Musculoskeletal: Normal range of motion. She exhibits no edema and no tenderness.  Neurological: She is alert and oriented to person, place, and time.  Skin: Skin is warm and dry. No rash noted. No erythema.  Psychiatric: She has a normal mood and affect. Her behavior is normal.    ED Course   Procedures (including critical care time)  Labs Reviewed  COMPREHENSIVE METABOLIC PANEL - Abnormal; Notable for the following:    Potassium 2.9 (*)    Glucose, Bld 126 (*)    GFR calc non Af Amer 60 (*)    GFR calc Af Amer 70 (*)    All other components within normal limits  URINALYSIS, ROUTINE W REFLEX MICROSCOPIC - Abnormal; Notable for the following:    Leukocytes, UA SMALL (*)    All other components within normal limits  CBC WITH DIFFERENTIAL  TROPONIN I  URINE MICROSCOPIC-ADD ON   Dg Chest 2 View  01/12/2013   *RADIOLOGY REPORT*  Clinical Data: Syncope.  CHEST - 2 VIEW  Comparison: Chest CT 01/26/2004.  Findings: Cardiopericardial silhouette within normal limits for projection. There is no airspace disease .  No effusion.  Aortic arch atherosclerosis.  The lungs clear aside from subsegmental atelectasis or scarring at the left base. Monitoring leads are projected over the chest.   IMPRESSION: No active cardiopulmonary disease.   Original Report Authenticated By: Andreas Newport, M.D.    Date: 01/12/2013  Rate: 76  Rhythm: normal sinus rhythm  QRS Axis: normal  Intervals: normal  ST/T Wave abnormalities: nonspecific T wave changes  Conduction Disutrbances:none  Narrative Interpretation:   Old EKG Reviewed: none available   1. Syncope     MDM   Patient with 2 syncopal episodes in the last 2 days.  Initial episode yesterday with no program in recovery without complications and today with a prodrome of light headed and feeling like she was going to pass out. She denied any chest pain, shortness of breath, palpitations. She denies any recent medication changes and has been eating and drinking as she normally would but states she didn't never eats or drinks much. She denies any pain, headache, focal weakness, speech difficulty or other neurologic complaints. She has no prior history of syncope and no prior cardiac issues except for hypertension. No focal findings on exam today. EKG within normal limits other than mild nonspecific T-wave inversion in aVF and V3. Chest x-ray within normal limits in labs do indicate a hypokalemia of 2.9 but otherwise normal labs.  Review of patient's syncope today could be do to orthostatics as her room heart rate from lying to sitting went from 70-90 blood pressure did not change versus hypokalemia versus other cardiac cause. Will admit for further evaluation potassium replacement 60 mEq of oral potassium.  12:04 AM Pt and family became very upset with the length of time that it took for [facility] to arrive and no longer wanted to wait and wanted to take her home.  They signed out AMA. Gwyneth Sprout, MD 01/12/13 1610  Gwyneth Sprout, MD 01/13/13 0005

## 2013-01-12 NOTE — ED Notes (Signed)
Pt c/o "passed out" once today and once yesterday-both days approx 5pm-denies pain with event and at present-pt A/O-NAD-was brought in by daughter

## 2013-01-12 NOTE — ED Notes (Signed)
MD at bedside giving test results and plan of care. 

## 2013-01-12 NOTE — ED Notes (Signed)
Patient transported to X-ray 

## 2013-01-13 ENCOUNTER — Observation Stay (HOSPITAL_BASED_OUTPATIENT_CLINIC_OR_DEPARTMENT_OTHER)
Admission: EM | Admit: 2013-01-13 | Discharge: 2013-01-15 | Disposition: A | Discharge status: Home / Self Care | Payer: Medicare Other | Source: Home / Self Care | Attending: Emergency Medicine | Admitting: Emergency Medicine

## 2013-01-13 ENCOUNTER — Emergency Department (HOSPITAL_COMMUNITY): Payer: Medicare Other

## 2013-01-13 ENCOUNTER — Encounter (HOSPITAL_COMMUNITY): Payer: Self-pay | Admitting: Cardiology

## 2013-01-13 DIAGNOSIS — E875 Hyperkalemia: Secondary | ICD-10-CM | POA: Diagnosis present

## 2013-01-13 DIAGNOSIS — I10 Essential (primary) hypertension: Secondary | ICD-10-CM | POA: Diagnosis present

## 2013-01-13 DIAGNOSIS — R55 Syncope and collapse: Secondary | ICD-10-CM | POA: Diagnosis present

## 2013-01-13 DIAGNOSIS — R42 Dizziness and giddiness: Secondary | ICD-10-CM

## 2013-01-13 LAB — CBC WITH DIFFERENTIAL/PLATELET
Basophils Absolute: 0 10*3/uL (ref 0.0–0.1)
Basophils Relative: 1 % (ref 0–1)
Eosinophils Absolute: 0.1 10*3/uL (ref 0.0–0.7)
Eosinophils Relative: 2 % (ref 0–5)
HCT: 38.9 % (ref 36.0–46.0)
Hemoglobin: 13.5 g/dL (ref 12.0–15.0)
Lymphocytes Relative: 19 % (ref 12–46)
Lymphs Abs: 1.2 K/uL (ref 0.7–4.0)
MCH: 31.1 pg (ref 26.0–34.0)
MCHC: 34.7 g/dL (ref 30.0–36.0)
MCV: 89.6 fL (ref 78.0–100.0)
Monocytes Absolute: 0.7 K/uL (ref 0.1–1.0)
Monocytes Relative: 11 % (ref 3–12)
Neutro Abs: 4.2 K/uL (ref 1.7–7.7)
Neutrophils Relative %: 68 % (ref 43–77)
Platelets: 220 10*3/uL (ref 150–400)
RBC: 4.34 MIL/uL (ref 3.87–5.11)
RDW: 13.4 % (ref 11.5–15.5)
WBC: 6.1 10*3/uL (ref 4.0–10.5)

## 2013-01-13 LAB — BASIC METABOLIC PANEL
CO2: 24 mEq/L (ref 19–32)
Calcium: 9.8 mg/dL (ref 8.4–10.5)
GFR calc Af Amer: >90 mL/min (ref >90)
GFR calc non Af Amer: 78 mL/min — ABNORMAL LOW (ref >90)
Sodium: 140 mEq/L (ref 135–145)

## 2013-01-13 LAB — BASIC METABOLIC PANEL WITH GFR
BUN: 11 mg/dL (ref 6–23)
Chloride: 103 meq/L (ref 96–112)
Creatinine, Ser: 0.79 mg/dL (ref 0.50–1.10)
Glucose, Bld: 103 mg/dL — ABNORMAL HIGH (ref 70–99)
Potassium: 5.4 meq/L — ABNORMAL HIGH (ref 3.5–5.1)

## 2013-01-13 LAB — TROPONIN I: Troponin I: <0.3 ng/mL (ref <0.30)

## 2013-01-13 NOTE — ED Notes (Signed)
Dr. Oletta Lamas spoke with patient. Patient now agreeable to CT scan. Notified CT.

## 2013-01-13 NOTE — ED Notes (Signed)
Family states that they want to see a MD before having any repeat blood work done.

## 2013-01-13 NOTE — ED Notes (Signed)
MD at bedside. 

## 2013-01-13 NOTE — ED Notes (Signed)
Pt denies any CP or dizziness, pt's family states that they will take her home and watch after her then follow-up with her PCP. Pt alert and oriented x 4 and agrees with family plan of care. Pt placed in W/C and escorted out of dept with family.

## 2013-01-13 NOTE — ED Provider Notes (Signed)
CSN: [id number]     Arrival date & time 01/13/13  1802 History     First MD Initiated Contact with Patient 01/13/13 1958     Chief Complaint  Patient presents with  . Dizziness   (Consider location/radiation/quality/duration/timing/severity/associated sxs/prior Treatment) HPI Comments: Pt with episodes of dizziness desribed as spinning sensation on Tuesday, yesterday, had a syncopal episode while walking dog, denies feeling dizzy or sweaty, SOB prior to episode, woke up on ground, thinks she may have blacked out for a few seconds, initially was mildly confused, knew she was on the ground and had fainted.  No CP, SOB, HA, back pain, diaphoresis prior to episode.  Pt denies long distance travel recently.  Pt has h/o TIA (I reviewed brain MRI from 2011 - no indication of sig cerebrovascular disease then).  Pt was seen at [facility], was slated to be admitted for syncope, pt didn't want to wait for [facility] and so left AMA.  Continues to feel generally weak today, came back to [facility] for further eval.  Denies diarrhea, black stools.    The history is provided by the patient.    Past Medical History  Diagnosis Date  . Hyperlipidemia   . Hypertension   . RLS (restless legs syndrome)   . Sleep disorder   . Personal history of colonic polyps   . Osteoporosis   . DJD (degenerative joint disease) of cervical spine   . Varicose veins   . TIA (transient ischemic attack)   . Carotid artery occlusion    Past Surgical History  Procedure Laterality Date  . Anterior and posterior vaginal repair w/ sacrospinous ligament suspension    . Endovenous ablation saphenous vein w/ laser     Family History  Problem Relation Age of Onset  . Mental illness Mother   . Cancer Father    History  Substance Use Topics  . Smoking status: Former Smoker    Quit date: 06/16/1980  . Smokeless tobacco: Never Used  . Alcohol Use: No   OB History   Grav Para Term Preterm Abortions TAB SAB Ect Mult Living                  Review of Systems  Constitutional: Negative for fever and chills.  HENT: Negative for congestion.   Respiratory: Negative for chest tightness and shortness of breath.   Cardiovascular: Negative for chest pain.  Gastrointestinal: Negative for nausea, vomiting, abdominal pain and blood in stool.  Musculoskeletal: Negative for back pain.  Skin: Negative for color change and wound.  Neurological: Positive for dizziness and syncope. Negative for light-headedness.  All other systems reviewed and are negative.    Allergies  Codeine  Home Medications   Current Outpatient Rx  Name  Route  Sig  Dispense  Refill  . aspirin 325 MG tablet   Oral   Take 325 mg by mouth daily.           . cholecalciferol (VITAMIN D) 1000 UNITS tablet   Oral   Take 1,000 Units by mouth daily.           . cyanocobalamin 500 MCG tablet   Oral   Take 500 mcg by mouth daily.          . fenofibrate micronized (LOFIBRA) 200 MG capsule   Oral   Take 200 mg by mouth daily before breakfast.         . ibuprofen (ADVIL,MOTRIN) 200 MG tablet   Oral   Take 200  mg by mouth every 6 (six) hours as needed.           Marland Kitchen lisinopril (PRINIVIL,ZESTRIL) 20 MG tablet   Oral   Take 20 mg by mouth daily.           Marland Kitchen oxyCODONE-acetaminophen (PERCOCET/ROXICET) 5-325 MG per tablet   Oral   Take 1 tablet by mouth every 4 (four) hours as needed for pain.   12 tablet   0   . simvastatin (ZOCOR) 80 MG tablet   Oral   Take 80 mg by mouth daily.          BP 144/124  Pulse 79  Temp(Src) 98.3 F (36.8 C) (Oral)  SpO2 97% Physical Exam  Nursing note and vitals reviewed. Constitutional: She is oriented to person, place, and time. She appears well-developed and well-nourished. No distress.  HENT:  Head: Normocephalic and atraumatic.  Eyes: EOM are normal.  Neck: Normal range of motion. Neck supple.  Cardiovascular: Normal rate, regular rhythm and intact distal pulses.   No murmur  heard. Pulmonary/Chest: Effort normal and breath sounds normal. No respiratory distress. She has no wheezes. She has no rales.  Abdominal: Soft.  Neurological: She is alert and oriented to person, place, and time. She exhibits normal muscle tone. Coordination normal.  Skin: Skin is warm and dry. She is not diaphoretic.  Psychiatric: She has a normal mood and affect.    ED Course   Procedures (including critical care time)  Labs Reviewed  BASIC METABOLIC PANEL - Abnormal; Notable for the following:    Potassium 5.4 (*)    Glucose, Bld 103 (*)    GFR calc non Af Amer 78 (*)    All other components within normal limits  CBC WITH DIFFERENTIAL  TROPONIN I  POTASSIUM   Dg Chest 2 View  01/12/2013   *RADIOLOGY REPORT*  Clinical Data: Syncope.  CHEST - 2 VIEW  Comparison: Chest CT 01/26/2004.  Findings: Cardiopericardial silhouette within normal limits for projection. There is no airspace disease .  No effusion.  Aortic arch atherosclerosis.  The lungs clear aside from subsegmental atelectasis or scarring at the left base. Monitoring leads are projected over the chest.  IMPRESSION: No active cardiopulmonary disease.   Original Report Authenticated By: Andreas Newport, M.D.   1. Syncope   2. Hypertension   3. Dizziness      RA sat is 94% and I interpret to be adequate  ECG at time 1825 shows NSR with sinus arrythmia, LVH with repol abn, mild.  Poor r wave progression leads V2-V4.  No overt ST or T wave abn's.  Abn ECG.  Poor r wave progression extends to lead V4 compared to ECG from 01/12/13.  10:57 PM Pt is agreeable to obtain head CT, possibly MRI in the AM and admission to the hospital.  Spoke to [facility] who agrees to admit pt.    MDM  I reviewed notes from ED yesterday.  Pt still not completely back to baseline is HTN although she admits she is upset because she didn't get to fly to [place] to visit family as previously planned today.  No head CT was done yesterday.  K+ was low  yesterday, today hemolyzed but only at 5.4.  Will need a repeat.  I would still admit for observation, telelmetry, consideration of brain MRI given h/o TIA and persistent symptoms.    Gavin Pound. Oletta Lamas, MD 01/13/13 2257

## 2013-01-13 NOTE — ED Notes (Addendum)
Patient back from CT. Refused scan. EDP notified. Placed back on cardiac monitor

## 2013-01-13 NOTE — ED Notes (Signed)
Patient transported to CT 

## 2013-01-13 NOTE — ED Notes (Signed)
Pt's family irate regarding wait time for transport. Pt's family states they want to sign her out AMA and transport home. MD aware.

## 2013-01-13 NOTE — ED Notes (Addendum)
2014 - Patient presents with c/o episodic dizziness and lightheadedness. Has hx vertigo. No diaphoresis, N/V, vision changes, CP, SOB, headache, incontinence or diarrhea. Patient also reports syncopal episode 2 days ago that lasted "only a few seconds." patient was not amnesic to the event. Also had an episode 1 day ago while sitting at the table. She did not pass out. States that it feels like "I'm not right" when it happens. Currently no dizziness. Neuro intact. Strength equal bilaterally. AAOx4, resp e/u, NAD noted at this time.   2023 - patient and daughter reports no episodes of dizziness today but wanted to be evaluated. Was seen at [facility] yesterday and admitted to 3W but left AMA because they did not want to wait on [facility] for transport to [facility]

## 2013-01-13 NOTE — ED Notes (Signed)
Pt reports that she was to be admitted last night via [facility] but they did not want to wait for the room to be assigned. States that she came into be seen because she was having syncopal episodes and feeling dizziness. States that she has had 2 syncopal episodes over the past week without hitting her head. No distress noted at triage.

## 2013-01-14 ENCOUNTER — Observation Stay (HOSPITAL_COMMUNITY): Payer: Medicare Other

## 2013-01-14 ENCOUNTER — Encounter (HOSPITAL_COMMUNITY): Payer: Self-pay | Admitting: General Practice

## 2013-01-14 DIAGNOSIS — R42 Dizziness and giddiness: Secondary | ICD-10-CM

## 2013-01-14 DIAGNOSIS — I10 Essential (primary) hypertension: Secondary | ICD-10-CM

## 2013-01-14 DIAGNOSIS — I517 Cardiomegaly: Secondary | ICD-10-CM

## 2013-01-14 DIAGNOSIS — E875 Hyperkalemia: Secondary | ICD-10-CM

## 2013-01-14 DIAGNOSIS — R55 Syncope and collapse: Secondary | ICD-10-CM

## 2013-01-14 LAB — CBC
Hemoglobin: 13.2 g/dL (ref 12.0–15.0)
MCH: 32 pg (ref 26.0–34.0)
MCV: 89.1 fL (ref 78.0–100.0)
Platelets: 224 10*3/uL (ref 150–400)
RBC: 4.12 MIL/uL (ref 3.87–5.11)
WBC: 5.6 10*3/uL (ref 4.0–10.5)

## 2013-01-14 LAB — BASIC METABOLIC PANEL
CO2: 27 mEq/L (ref 19–32)
Calcium: 9.5 mg/dL (ref 8.4–10.5)
Chloride: 107 mEq/L (ref 96–112)
Creatinine, Ser: 0.78 mg/dL (ref 0.50–1.10)
Glucose, Bld: 95 mg/dL (ref 70–99)
Sodium: 144 mEq/L (ref 135–145)

## 2013-01-14 LAB — HEMOGLOBIN A1C: Hgb A1c MFr Bld: 5.1 % (ref <5.7)

## 2013-01-14 LAB — TSH: TSH: 1.898 u[IU]/mL (ref 0.350–4.500)

## 2013-01-14 LAB — POTASSIUM: Potassium: 3.6 mEq/L (ref 3.5–5.1)

## 2013-01-14 MED ORDER — ASPIRIN 325 MG PO TABS
325.0000 mg | ORAL_TABLET | Freq: Every day | ORAL | Status: DC
  Administered 2013-01-14 – 2013-01-15 (×2): 325 mg via ORAL
  Filled 2013-01-14 (×2): qty 1

## 2013-01-14 MED ORDER — ENOXAPARIN SODIUM 30 MG/0.3ML ~~LOC~~ SOLN
30.0000 mg | SUBCUTANEOUS | Status: DC
  Administered 2013-01-14: 30 mg via SUBCUTANEOUS
  Filled 2013-01-14 (×2): qty 0.3

## 2013-01-14 MED ORDER — ACETAMINOPHEN 650 MG RE SUPP: 650.0000 mg | Freq: Four times a day (QID) | RECTAL | Status: DC | PRN

## 2013-01-14 MED ORDER — FENOFIBRATE 160 MG PO TABS
160.0000 mg | ORAL_TABLET | Freq: Every day | ORAL | Status: DC
  Administered 2013-01-14 – 2013-01-15 (×2): 160 mg via ORAL
  Filled 2013-01-14 (×2): qty 1

## 2013-01-14 MED ORDER — SODIUM CHLORIDE 0.9 % IJ SOLN
3.0000 mL | Freq: Two times a day (BID) | INTRAMUSCULAR | Status: DC
  Administered 2013-01-14 (×2): 3 mL via INTRAVENOUS

## 2013-01-14 MED ORDER — HYDRALAZINE HCL 20 MG/ML IJ SOLN: 10.0000 mg | INTRAMUSCULAR | Status: DC | PRN

## 2013-01-14 MED ORDER — LISINOPRIL 20 MG PO TABS
20.0000 mg | ORAL_TABLET | Freq: Every day | ORAL | Status: DC
  Administered 2013-01-14 – 2013-01-15 (×3): 20 mg via ORAL
  Filled 2013-01-14 (×3): qty 1

## 2013-01-14 MED ORDER — OXYCODONE-ACETAMINOPHEN 5-325 MG PO TABS: 1.0000 | ORAL_TABLET | ORAL | Status: DC | PRN

## 2013-01-14 MED ORDER — ATORVASTATIN CALCIUM 40 MG PO TABS
40.0000 mg | ORAL_TABLET | Freq: Every day | ORAL | Status: DC
  Administered 2013-01-14: 40 mg via ORAL
  Filled 2013-01-14 (×3): qty 1

## 2013-01-14 MED ORDER — MECLIZINE HCL 12.5 MG PO TABS
12.5000 mg | ORAL_TABLET | Freq: Three times a day (TID) | ORAL | Status: DC
  Filled 2013-01-14 (×3): qty 1

## 2013-01-14 MED ORDER — MECLIZINE HCL 12.5 MG PO TABS
12.5000 mg | ORAL_TABLET | Freq: Three times a day (TID) | ORAL | Status: DC | PRN
  Filled 2013-01-14: qty 1

## 2013-01-14 MED ORDER — ACETAMINOPHEN 325 MG PO TABS: 650.0000 mg | ORAL_TABLET | Freq: Four times a day (QID) | ORAL | Status: DC | PRN

## 2013-01-14 NOTE — Progress Notes (Signed)
Patient ID: Heather Floyd  female  OZH:[id number]    DOB: Sep 25, 1932    DOA: 01/13/2013  PCP: Neldon Labella, MD  Assessment/Plan: Principal Problem:   Near syncope - Likely secondary to accelerated hypertension, patient also has been complaining of vertigo and dizziness for last one month or vasovagal. - 2-D echo done, results pending - Carotid Doppler showed less than 39% ICA stenosis bilaterally  Vertigo: - Patient was somewhat dizzy on sitting up, Also has a vertigo. Dix-halpike + - Placed on meclizine, obtain MRI of the brain - PT/OT vestibular rehabilitation   Active Problems:   Accelerated hypertension - BP improving now    Hyperkalemia - Resolved  DVT Prophylaxis:  Code Status:  Disposition:Hopefully tomorrow a.m. if back to her baseline    Subjective: Still somewhat dizzy on sitting up and 'room spinning' feeling  Objective: Weight change:  No intake or output data in the 24 hours ending 01/14/13 1140 Blood pressure 139/76, pulse 80, temperature 98.2 F (36.8 C), temperature source Oral, resp. rate 18, height 5\' 2"  (1.575 m), weight 54.477 kg (120 lb 1.6 oz), SpO2 94.00%.  Physical Exam: General: Alert and awake, oriented x3, not in any acute distress. HEENT: anicteric sclera, PERLA, EOMI, NO NYSTAGMUS  CVS: S1-S2 clear, no murmur rubs or gallops Chest: clear to auscultation bilaterally, no wheezing, rales or rhonchi Abdomen: soft nontender, nondistended, normal bowel sounds  Extremities: no cyanosis, clubbing or edema noted bilaterally Neuro: Cranial nerves II-XII intact, no focal neurological deficits  Lab Results: Basic Metabolic Panel:  Recent Labs Lab 01/13/13 2035 01/13/13 2323 01/14/13 0504  NA 140  --  144  K 5.4* 3.6 3.5  CL 103  --  107  CO2 24  --  27  GLUCOSE 103*  --  95  BUN 11  --  10  CREATININE 0.79  --  0.78  CALCIUM 9.8  --  9.5   Liver Function Tests:  Recent Labs Lab 01/12/13 2047  AST 24  ALT 18  ALKPHOS 54   BILITOT 0.4  PROT 6.8  ALBUMIN 4.1   No results found for this basename: LIPASE, AMYLASE,  in the last 168 hours No results found for this basename: AMMONIA,  in the last 168 hours CBC:  Recent Labs Lab 01/13/13 2035 01/14/13 0504  WBC 6.1 5.6  NEUTROABS 4.2  --   HGB 13.5 13.2  HCT 38.9 36.7  MCV 89.6 89.1  PLT 220 224   Cardiac Enzymes:  Recent Labs Lab 01/12/13 2047 01/13/13 2035  TROPONINI <0.30 <0.30   BNP: No components found with this basename: POCBNP,  CBG: No results found for this basename: GLUCAP,  in the last 168 hours   Micro Results: No results found for this or any previous visit (from the past 240 hour(s)).  Studies/Results: Dg Chest 2 View  01/12/2013   *RADIOLOGY REPORT*  Clinical Data: Syncope.  CHEST - 2 VIEW  Comparison: Chest CT 01/26/2004.  Findings: Cardiopericardial silhouette within normal limits for projection. There is no airspace disease .  No effusion.  Aortic arch atherosclerosis.  The lungs clear aside from subsegmental atelectasis or scarring at the left base. Monitoring leads are projected over the chest.  IMPRESSION: No active cardiopulmonary disease.   Original Report Authenticated By: Andreas Newport, M.D.   Ct Head Wo Contrast  01/13/2013   *RADIOLOGY REPORT*  Clinical Data: Dizziness and blurred vision.  CT HEAD WITHOUT CONTRAST  Technique:  Contiguous axial images were obtained from the base of  the skull through the vertex without contrast.  Comparison: 03/19/2012  Findings: Stable mild cortical atrophy present. The brain demonstrates no evidence of hemorrhage, infarction, edema, mass effect, extra-axial fluid collection, hydrocephalus or mass lesion. The skull is unremarkable.  IMPRESSION: No acute findings.  Stable atrophy.   Original Report Authenticated By: Irish Lack, M.D.    Medications: Scheduled Meds: . aspirin  325 mg Oral Daily  . atorvastatin  40 mg Oral q1800  . enoxaparin (LOVENOX) injection  30 mg Subcutaneous  Q24H  . fenofibrate  160 mg Oral Daily  . lisinopril  20 mg Oral Daily  . meclizine  12.5 mg Oral TID  . sodium chloride  3 mL Intravenous Q12H      LOS: 1 day   RAI,RIPUDEEP M.D. [facility] 01/14/2013, 11:40 AM Pager: [phone]  If 7PM-7AM, please contact night-coverage [email or web address] Password [email or web address]

## 2013-01-14 NOTE — Progress Notes (Signed)
*  PRELIMINARY RESULTS* Echocardiogram 2D Echocardiogram has been performed.  Heather Floyd 01/14/2013, 10:43 AM

## 2013-01-14 NOTE — Progress Notes (Signed)
*  PRELIMINARY RESULTS* Vascular Ultrasound Carotid Duplex (Doppler) has been completed.  Preliminary findings: Bilateral:  Less than 39% ICA stenosis.  Vertebral artery flow is antegrade.     Farrel Demark, RDMS, RVT  01/14/2013, 10:14 AM

## 2013-01-14 NOTE — Progress Notes (Signed)
Physical Therapy Evaluation Patient Details Name: Heather Floyd MRN: [id number] DOB: 12-03-1932 Today's Date: 01/14/2013 Time: 3086-5784 PT Time Calculation (min): 42 min  PT Assessment / Plan / Recommendation History of Present Illness  Pt admit with near syncope.  Pt also with vertigo.    Clinical Impression  Pt admitted with vertigo. Pt currently with functional limitations due to the deficits listed below (see PT Problem List). MD:  Pt will need a prescription for Outpt PT f/u for vestibular rehab prior to d/c home.   Educated pt in BPPV and gave handouts and pt aware that PT recommends her f/u with Outpt if vertigo persists.  Also gave pt an exercise as well for BPPV. Spoke at length with pt and answered all her questions.  Feel that treatment today was effective but will have therapist f/u in am to recheck pt as needed.   Pt will benefit from skilled PT to increase their independence and safety with mobility to allow discharge to the venue listed below.     PT Assessment  Patient needs continued PT services    Follow Up Recommendations  Outpatient PT;Supervision - Intermittent (Vestibular Rehab)                Equipment Recommendations  None recommended by PT         Frequency Min 3X/week    Precautions / Restrictions Precautions Precautions: Fall;Other (comment) (vertigo) Restrictions Weight Bearing Restrictions: No   Pertinent Vitals/Pain VSS, No pain      Mobility  Bed Mobility Bed Mobility: Rolling Left;Rolling Right;Right Sidelying to Sit;Left Sidelying to Sit;Sitting - Scoot to [place] of Bed Rolling Right: 7: Independent Rolling Left: 7: Independent Right Sidelying to Sit: 7: Independent Left Sidelying to Sit: 7: Independent Sitting - Scoot to Edge of Bed: 7: Independent Details for Bed Mobility Assistance: Pt positive for right BPPV but negative for left.  Treated with canalith repositioning maneuver for right BPPV.  Pt reported that she had no symptoms post  treatment.   Transfers Transfers: Sit to Stand;Stand to Sit Sit to Stand: 7: Independent Stand to Sit: 7: Independent Ambulation/Gait Ambulation/Gait Assistance: 5: Supervision Ambulation Distance (Feet): 250 Feet Assistive device: None Ambulation/Gait Assistance Details: Pt without LOB with min challenges.  Feels she is at baseline.  Does well in controlled enviroment. Gait Pattern: Step-through pattern Stairs: No Wheelchair Mobility Wheelchair Mobility: No    Exercises Other Exercises Other Exercises: Instructed pt in [employer] exercise and gave handout to perform only if needed.   PT Diagnosis: Other (comment) (dizziness)  PT Problem List: Decreased mobility;Decreased knowledge of precautions (vertigo/dizziness) PT Treatment Interventions: Patient/family education;Therapeutic exercise;Functional mobility training (vestibular rehab)     PT Goals(Current goals can be found in the care plan section) Acute Rehab PT Goals Patient Stated Goal: to go home PT Goal Formulation: With patient Time For Goal Achievement: 01/21/13 Potential to Achieve Goals: Good  Visit Information  Last PT Received On: 01/14/13 Assistance Needed: +1 History of Present Illness: Pt admit with near syncope.  Pt also with vertigo.         Prior Functioning  Home Living Family/patient expects to be discharged to:: Private residence Living Arrangements: Children (daughter lives with pt.) Available Help at Discharge: Family;Available 24 hours/day Type of Home: House Home Access: Level entry Home Layout: One level Home Equipment: None Prior Function Level of Independence: Independent Communication Communication: No difficulties    Cognition  Cognition Arousal/Alertness: Awake/alert Behavior During Therapy: WFL for tasks assessed/performed Overall Cognitive  Status: Within Functional Limits for tasks assessed    Extremity/Trunk Assessment Upper Extremity Assessment Upper Extremity  Assessment: Defer to OT evaluation Lower Extremity Assessment Lower Extremity Assessment: Overall WFL for tasks assessed Cervical / Trunk Assessment Cervical / Trunk Assessment: Normal   Balance Standardized Balance Assessment Standardized Balance Assessment: Dynamic Gait Index Dynamic Gait Index Level Surface: Normal Change in Gait Speed: Normal Gait with Horizontal Head Turns: Mild Impairment Gait with Vertical Head Turns: Mild Impairment Gait and Pivot Turn: Normal Step Over Obstacle: Normal Step Around Obstacles: Normal Steps: Mild Impairment Total Score: 21  End of Session PT - End of Session Equipment Utilized During Treatment: Gait belt Activity Tolerance: Patient tolerated treatment well Patient left: in bed;with call bell/phone within reach Nurse Communication: Mobility status  GP Functional Assessment Tool Used: clinical judgement Functional Limitation: Mobility: Walking and moving around Mobility: Walking and Moving Around Current Status ([id number]): At least 1 percent but less than 20 percent impaired, limited or restricted Mobility: Walking and Moving Around Goal Status [phone]): 0 percent impaired, limited or restricted   INGOLD,Bluma Buresh 01/14/2013, 4:12 PM [facility] Acute Rehabilitation [phone] (pager)

## 2013-01-14 NOTE — Progress Notes (Signed)
Utilization review complete. Aianna Fahs RN CCM Case Mgmt phone [phone] 

## 2013-01-14 NOTE — H&P (Signed)
[facility] History and Physical  Heather Floyd  WUJ:[id number]  DOB: Mar 07, 1933  DOA: 01/13/2013  Referring physician: Dr. Oletta Lamas PCP: Neldon Labella, MD   Chief Complaint: Near syncope  HPI: Heather Floyd is a 79 y.o. female with Past medical history of hypertension restless leg syndrome and TIA. She presented yesterday to [facility] with the complaint of near syncope. She mentions when she was out with her dog all of a sudden she fell down on the ground without any prior warning or aura and felt everything went black. She denied any incontinence of bowel or bladder, fever, chills, dizziness, chest pain, palpitation, lightheadedness, or dizziness or vertigo, focal neurological deficit. She had a similar episode a few days ago at which time she was in the house. Both time she denies any mechanical fall. At present she denies any complaint. She has not taken her blood pressure medication today.  Review of Systems: as mentioned in the history of present illness.  A Comprehensive review of the other systems is negative.  Past Medical History  Diagnosis Date  . Hyperlipidemia   . Hypertension   . RLS (restless legs syndrome)   . Sleep disorder   . Personal history of colonic polyps   . Osteoporosis   . DJD (degenerative joint disease) of cervical spine   . Varicose veins   . TIA (transient ischemic attack)   . Carotid artery occlusion    Past Surgical History  Procedure Laterality Date  . Anterior and posterior vaginal repair w/ sacrospinous ligament suspension    . Endovenous ablation saphenous vein w/ laser     Social History:  reports that she quit smoking about 32 years ago. She has never used smokeless tobacco. She reports that she does not drink alcohol or use illicit drugs. Patient is coming from home. Patient can participate in ADLs.  Allergies  Allergen Reactions  . Codeine Nausea Only    Family History  Problem Relation Age of Onset  .  Mental illness Mother   . Cancer Father     Prior to Admission medications   Medication Sig Start Date End Date Taking? Authorizing Provider  aspirin 325 MG tablet Take 325 mg by mouth daily.     Yes Historical Provider, MD  cholecalciferol (VITAMIN D) 1000 UNITS tablet Take 1,000 Units by mouth daily.     Yes Historical Provider, MD  cyanocobalamin 500 MCG tablet Take 500 mcg by mouth daily.    Yes Historical Provider, MD  fenofibrate micronized (LOFIBRA) 200 MG capsule Take 200 mg by mouth daily before breakfast.   Yes Historical Provider, MD  ibuprofen (ADVIL,MOTRIN) 200 MG tablet Take 200 mg by mouth every 6 (six) hours as needed.     Yes Historical Provider, MD  lisinopril (PRINIVIL,ZESTRIL) 20 MG tablet Take 20 mg by mouth daily.     Yes Historical Provider, MD  oxyCODONE-acetaminophen (PERCOCET/ROXICET) 5-325 MG per tablet Take 1 tablet by mouth every 4 (four) hours as needed for pain. 03/19/12  Yes Raeford Razor, MD  simvastatin (ZOCOR) 80 MG tablet Take 80 mg by mouth daily.   Yes Historical Provider, MD    Physical Exam: Filed Vitals:   01/13/13 2130 01/13/13 2200 01/13/13 2317 01/13/13 2319  BP: 171/88 144/124 201/87 201/87  Pulse: 78 79 77 79  Temp:    98.7 F (37.1 C)  TempSrc:    Oral  Resp:    24  SpO2: 91% 97% 97% 95%    General: Alert,  Awake and Oriented to Time, Place and Person. Appear in no distress Eyes: PERRL ENT: Oral Mucosa clear moist. Neck: No JVD, no Carotid Bruits, no Stiffness Cardiovascular: S1 and S2 Present, no Murmur, Peripheral Pulses Present Respiratory: Bilateral Air entry equal and Decreased, Clear to Auscultation,  Abdomen: Bowel Sound Present, Soft and Non tender, Skin: No Rash Extremities: No Pedal edema, no calf tenderness Neurologic: Grossly Unremarkable.  Labs on Admission:  Basic Metabolic Panel:  Recent Labs Lab 01/12/13 2047 01/13/13 2035  NA 144 140  K 2.9* 5.4*  CL 103 103  CO2 31 24  GLUCOSE 126* 103*  BUN 9 11   CREATININE 0.90 0.79  CALCIUM 10.3 9.8   Liver Function Tests:  Recent Labs Lab 01/12/13 2047  AST 24  ALT 18  ALKPHOS 54  BILITOT 0.4  PROT 6.8  ALBUMIN 4.1   No results found for this basename: LIPASE, AMYLASE,  in the last 168 hours No results found for this basename: AMMONIA,  in the last 168 hours CBC:  Recent Labs Lab 01/12/13 2047 01/13/13 2035  WBC 6.4 6.1  NEUTROABS 4.3 4.2  HGB 13.4 13.5  HCT 37.3 38.9  MCV 88.8 89.6  PLT 244 220   Cardiac Enzymes:  Recent Labs Lab 01/12/13 2047 01/13/13 2035  TROPONINI <0.30 <0.30    BNP (last 3 results) No results found for this basename: PROBNP,  in the last 8760 hours CBG: No results found for this basename: GLUCAP,  in the last 168 hours  Radiological Exams on Admission: Dg Chest 2 View  01/12/2013   *RADIOLOGY REPORT*  Clinical Data: Syncope.  CHEST - 2 VIEW  Comparison: Chest CT 01/26/2004.  Findings: Cardiopericardial silhouette within normal limits for projection. There is no airspace disease .  No effusion.  Aortic arch atherosclerosis.  The lungs clear aside from subsegmental atelectasis or scarring at the left base. Monitoring leads are projected over the chest.  IMPRESSION: No active cardiopulmonary disease.   Original Report Authenticated By: Andreas Newport, M.D.   Ct Head Wo Contrast  01/13/2013   *RADIOLOGY REPORT*  Clinical Data: Dizziness and blurred vision.  CT HEAD WITHOUT CONTRAST  Technique:  Contiguous axial images were obtained from the base of the skull through the vertex without contrast.  Comparison: 03/19/2012  Findings: Stable mild cortical atrophy present. The brain demonstrates no evidence of hemorrhage, infarction, edema, mass effect, extra-axial fluid collection, hydrocephalus or mass lesion. The skull is unremarkable.  IMPRESSION: No acute findings.  Stable atrophy.   Original Report Authenticated By: Irish Lack, M.D.    EKG: Independently reviewed. No ST-T wave changes suggestive  of acute ischemia, no significant arrhythmia  Assessment/Plan Principal Problem:   Near syncope Active Problems:   Accelerated hypertension   Hyperkalemia   1. near syncope The patient denies that she lost her consciousness but she felt that everything blacked out. She denies any other associated symptom. CT scan of the head is negative as well as EKG and troponin and other lab. Given all pain echocardiogram of the heart, duplex of the carotids to look for other etiology. She also has history of TIA. We will monitor her on telemetry.  2. accelerated hypertension Patient's blood pressure is significantly elevated. Most likely because she has not taken her medication. She mentions that her blood pressure has been all over even when she is taking medication. We will continue her home dose of lisinopril and use when necessary hydralazine for blood pressure control.  3. Hyperkalemia Patient denies  use of exess potassium. Yesterday when she was in the ER she was hypokalemic and was given supplemental potassium. we we'll monitor her BMP. At present no indication for acute therapy or Kayexalate.  DVT Prophylaxis: subcutaneous Lovenox  Nutrition: Cardiac diet  Code Status: Full    Author: Lynden Oxford, MD [facility] Pager: [phone] 01/14/2013 12:04 AM    If 7PM-7AM, please contact night-coverage [email or web address] Password [email or web address]

## 2013-01-15 DIAGNOSIS — E875 Hyperkalemia: Secondary | ICD-10-CM

## 2013-01-15 DIAGNOSIS — I10 Essential (primary) hypertension: Secondary | ICD-10-CM

## 2013-01-15 DIAGNOSIS — R42 Dizziness and giddiness: Secondary | ICD-10-CM

## 2013-01-15 DIAGNOSIS — R55 Syncope and collapse: Secondary | ICD-10-CM

## 2013-01-15 MED ORDER — MECLIZINE HCL 12.5 MG PO TABS: 12.5000 mg | ORAL_TABLET | Freq: Three times a day (TID) | ORAL | Status: DC | PRN

## 2013-01-15 NOTE — Discharge Summary (Signed)
Physician Discharge Summary  Patient ID: Heather Floyd MRN: [id number] DOB/AGE: Jan 17, 1933 79 y.o.  Admit date: 01/13/2013 Discharge date: 01/15/2013  Primary Care Physician:  Heather Labella, MD  Discharge Diagnoses:     Benign positional vertigo  . Near syncope . Accelerated hypertension . Hyperkalemia- resolved  Consults: None  Allergies:   Allergies  Allergen Reactions  . Codeine Nausea Only     Discharge Medications:   Medication List         aspirin 325 MG tablet  Take 325 mg by mouth daily.     cholecalciferol 1000 UNITS tablet  Commonly known as:  VITAMIN D  Take 1,000 Units by mouth daily.     cyanocobalamin 500 MCG tablet  Take 500 mcg by mouth daily.     fenofibrate micronized 200 MG capsule  Commonly known as:  LOFIBRA  Take 200 mg by mouth daily before breakfast.     ibuprofen 200 MG tablet  Commonly known as:  ADVIL,MOTRIN  Take 200 mg by mouth every 6 (six) hours as needed.     lisinopril 20 MG tablet  Commonly known as:  PRINIVIL,ZESTRIL  Take 20 mg by mouth daily.     meclizine 12.5 MG tablet  Commonly known as:  ANTIVERT  Take 1 tablet (12.5 mg total) by mouth 3 (three) times daily as needed for dizziness or nausea. Also available over the counter.     oxyCODONE-acetaminophen 5-325 MG per tablet  Commonly known as:  PERCOCET/ROXICET  Take 1 tablet by mouth every 4 (four) hours as needed for pain.     simvastatin 80 MG tablet  Commonly known as:  ZOCOR  Take 80 mg by mouth daily.         Brief H and P: For complete details please refer to admission H and P, but in brief Heather Floyd is a 79 y.o. female with Past medical history of hypertension restless leg syndrome and TIA. She presented yesterday to [facility] with the complaint of near syncope.  She mentioned that when she was out with her dog all of a sudden she fell down on the ground without any prior warning or aura and felt everything went black. She denied any  incontinence of bowel or bladder, fever, chills, dizziness, chest pain, palpitation, lightheadedness, or dizziness or vertigo, focal neurological deficit.  She had a similar episode a few days ago at which time she was in the house.     Hospital Course:   Near syncope - Likely secondary to accelerated hypertension or vertigo. Patient was admitted and ruled out for acute ACS with negative cardiac enzymes. 2-D echo was done which showed EF of 65-70%, no wall motion abnormalities, grade 1 diastolic dysfunction. Carotid Doppler showed less than 39% ICA stenosis bilaterally.   Vertigo: During hospitalization patient was noted to have vertigo. PTOT evaluation was done and patient had significant improvement with [place] exercise. She was briefly placed on meclizine but then after improvement with PT, changed to as needed. Patient was also instructed to use meclizine only if she is having vertigo and followup with her physician to order outpatient vestibular rehabilitation. She had repeat physical therapy done, her symptoms were completely resolved and per physical therapist does not need any vestibular rehabilitation right now. MRI of the brain was obtained which was unremarkable.   Accelerated hypertension : Unclear why patient's BP had been elevated at the time of admission, 201/87 at the time of triage. Now improved  Hyperkalemia -  Resolved, the patient denied any excess use of potassium. However she was in the ER a day before and she was hypokalemic and was given supplemental potassium.   Day of Discharge BP 130/80  Pulse 85  Temp(Src) 98.3 F (36.8 C) (Oral)  Resp 18  Ht 5\' 2"  (1.575 m)  Wt 54.477 kg (120 lb 1.6 oz)  BMI 21.96 kg/m2  SpO2 96%  Physical Exam: General: Alert and awake oriented x3 not in any acute distress. CVS: S1-S2 clear no murmur rubs or gallops Chest: clear to auscultation bilaterally, no wheezing rales or rhonchi Abdomen: soft nontender, nondistended, normal  bowel sounds Extremities: no cyanosis, clubbing or edema noted bilaterally Neuro: Cranial nerves II-XII intact, no focal neurological deficits   The results of significant diagnostics from this hospitalization (including imaging, microbiology, ancillary and laboratory) are listed below for reference.    LAB RESULTS: Basic Metabolic Panel:  Recent Labs Lab 01/13/13 2035 01/13/13 2323 01/14/13 0504  NA 140  --  144  K 5.4* 3.6 3.5  CL 103  --  107  CO2 24  --  27  GLUCOSE 103*  --  95  BUN 11  --  10  CREATININE 0.79  --  0.78  CALCIUM 9.8  --  9.5   Liver Function Tests:  Recent Labs Lab 01/12/13 2047  AST 24  ALT 18  ALKPHOS 54  BILITOT 0.4  PROT 6.8  ALBUMIN 4.1   No results found for this basename: LIPASE, AMYLASE,  in the last 168 hours No results found for this basename: AMMONIA,  in the last 168 hours CBC:  Recent Labs Lab 01/13/13 2035 01/14/13 0504  WBC 6.1 5.6  NEUTROABS 4.2  --   HGB 13.5 13.2  HCT 38.9 36.7  MCV 89.6 89.1  PLT 220 224   Cardiac Enzymes:  Recent Labs Lab 01/12/13 2047 01/13/13 2035  TROPONINI <0.30 <0.30   BNP: No components found with this basename: POCBNP,  CBG: No results found for this basename: GLUCAP,  in the last 168 hours  Significant Diagnostic Studies:  Ct Head Wo Contrast  01/13/2013   *RADIOLOGY REPORT*  Clinical Data: Dizziness and blurred vision.  CT HEAD WITHOUT CONTRAST  Technique:  Contiguous axial images were obtained from the base of the skull through the vertex without contrast.  Comparison: 03/19/2012  Findings: Stable mild cortical atrophy present. The brain demonstrates no evidence of hemorrhage, infarction, edema, mass effect, extra-axial fluid collection, hydrocephalus or mass lesion. The skull is unremarkable.  IMPRESSION: No acute findings.  Stable atrophy.   Original Report Authenticated By: Irish Lack, M.D.   Mr Brain Wo Contrast  01/14/2013   *RADIOLOGY REPORT*  Clinical Data: Hypertension.   Near syncope.  Weakness.  MRI HEAD WITHOUT CONTRAST  Technique:  Multiplanar, multiecho pulse sequences of the brain and surrounding structures were obtained according to standard protocol without intravenous contrast.  Comparison: Head CT 01/13/2013.  MRI 06/08/2010.  Findings: Diffusion imaging does not show any acute or subacute infarction.  All the brainstem is normal.  There are a few old small vessel infarctions within the cerebellum.  The cerebral hemispheres show a few old small vessel type infarctions within the hemispheric white matter.  No cortical or large vessel territory infarction.  No mass lesion, hemorrhage, hydrocephalus or extra- axial collection.  There are dilated perivascular spaces in the basal ganglia and the base of the brain in general.  No pituitary mass.  No inflammatory sinus disease.  No skull or  skull base lesion.  IMPRESSION: No acute finding.  Mild chronic small vessel change of the cerebellum and cerebral hemispheric white matter.   Original Report Authenticated By: Paulina Fusi, M.D.    2D ECHO: Study Conclusions  Left ventricle: The cavity size was normal. Wall thickness was increased in a pattern of mild LVH. Systolic function was vigorous. The estimated ejection fraction was in the range of 65% to 70%. Wall motion was normal; there were no regional wall motion abnormalities. Doppler parameters are consistent with abnormal left ventricular relaxation (grade 1 diastolic dysfunction).    Disposition and Follow-up:     Discharge Orders   Future Orders Complete By Expires     Diet - low sodium heart healthy  As directed     Discharge instructions  As directed     Comments:      Please take meclizine for vertigo as needed. If your symptoms of vertigo returns, please have your primary doctor order vestibular rehab for you.    Increase activity slowly  As directed         DISPOSITION: Home  DIET: Heart healthy diet  ACTIVITY:As tolerated    DISCHARGE  FOLLOW-UP Follow-up Information   Follow up with Heather Labella, MD. Schedule an appointment as soon as possible for a visit in 14 days. (for hospital follow-up)    Contact information:   [address] [place] [address] [phone]       Time spent on Discharge: 35 mins  Signed:   Aireona Torelli M.D. [facility] 01/15/2013, 1:40 PM Pager: [phone]

## 2013-01-15 NOTE — Progress Notes (Signed)
Physical Therapy Treatment Patient Details Name: Heather Floyd MRN: [id number] DOB: May 05, 1933 Today's Date: 01/15/2013 Time: 4098-1191 PT Time Calculation (min): 21 min  PT Assessment / Plan / Recommendation  History of Present Illness Pt admit with near syncope.  Pt also with vertigo.     PT Comments   Patient presents today with no vertigo symptoms.  Is independent with all mobility and gait.  Scored 23/24 on DGI balance assessment.  At this point, no f/u PT is recommended.  Patient instructed to call MD for OP PT consult for vestibular rehab if vertigo returns.   Follow Up Recommendations  No PT follow up;Supervision - Intermittent (F/U with OP PT if vertigo returns)     Does the patient have the potential to tolerate intense rehabilitation     Barriers to Discharge        Equipment Recommendations  None recommended by PT    Recommendations for Other Services    Frequency Min 3X/week   Progress towards PT Goals Progress towards PT goals: Goals met/education completed, patient discharged from PT  Plan Discharge plan needs to be updated    Precautions / Restrictions Precautions Precautions: None Precaution Comments: No dizziness/vertigo today Restrictions Weight Bearing Restrictions: No   Pertinent Vitals/Pain     Mobility  Bed Mobility Bed Mobility: Not assessed Transfers Transfers: Sit to Stand;Stand to Sit Sit to Stand: 7: Independent Stand to Sit: 7: Independent Ambulation/Gait Ambulation/Gait Assistance: 7: Independent Ambulation Distance (Feet): 300 Feet Assistive device: None Ambulation/Gait Assistance Details: Patient with good gait pattern, balance, and speed.  No loss of balance or vertigo during gait. Gait Pattern: Within Functional Limits Gait velocity: WFL Stairs: Yes Stairs Assistance: 6: Modified independent (Device/Increase time) Stair Management Technique: One rail Right;Alternating pattern;Forwards Number of Stairs: 5    Exercises       PT  Goals (current goals can now be found in the care plan section)    Visit Information  Last PT Received On: 01/15/13 Assistance Needed: +1 History of Present Illness: Pt admit with near syncope.  Pt also with vertigo.      Subjective Data  Subjective: Very talkative.  I'm ready to go home.   Cognition  Cognition Arousal/Alertness: Awake/alert Behavior During Therapy: WFL for tasks assessed/performed Overall Cognitive Status: Within Functional Limits for tasks assessed    Balance  Balance Balance Assessed: Yes Standardized Balance Assessment Standardized Balance Assessment: Dynamic Gait Index Dynamic Gait Index Level Surface: Normal Change in Gait Speed: Normal Gait with Horizontal Head Turns: Normal Gait with Vertical Head Turns: Normal Gait and Pivot Turn: Normal Step Over Obstacle: Normal Step Around Obstacles: Normal Steps: Mild Impairment Total Score: 23  End of Session PT - End of Session Activity Tolerance: Patient tolerated treatment well Patient left: in bed;with call bell/phone within reach (sitting EOB) Nurse Communication: Mobility status   GP Functional Assessment Tool Used: clinical judgement Functional Limitation: Mobility: Walking and moving around Mobility: Walking and Moving Around Current Status ([id number]): 0 percent impaired, limited or restricted Mobility: Walking and Moving Around Discharge Status [id number]): 0 percent impaired, limited or restricted   Vena Austria 01/15/2013, 10:38 AM Durenda Hurt. Renaldo Fiddler, [facility] Acute Rehab Services Pager [phone]

## 2013-03-09 ENCOUNTER — Ambulatory Visit: Payer: Medicare Other | Admitting: Neurology

## 2013-03-29 ENCOUNTER — Ambulatory Visit: Payer: Medicare Other | Admitting: Neurology

## 2013-03-29 ENCOUNTER — Encounter: Payer: Self-pay | Admitting: Neurology

## 2013-03-29 ENCOUNTER — Ambulatory Visit (INDEPENDENT_AMBULATORY_CARE_PROVIDER_SITE_OTHER): Payer: Medicare Other | Admitting: Neurology

## 2013-03-29 VITALS — BP 178/94 | HR 100 | Ht 61.5 in | Wt 117.0 lb

## 2013-03-29 DIAGNOSIS — R413 Other amnesia: Secondary | ICD-10-CM | POA: Insufficient documentation

## 2013-03-29 DIAGNOSIS — D518 Other vitamin B12 deficiency anemias: Secondary | ICD-10-CM

## 2013-03-29 HISTORY — DX: Other amnesia: R41.3

## 2013-03-29 MED ORDER — DONEPEZIL HCL 5 MG PO TABS: 5.0000 mg | ORAL_TABLET | Freq: Every day | ORAL | Status: DC

## 2013-03-29 NOTE — Patient Instructions (Signed)
We will start aricept (donepezil) 5 mg at night for one month, and if you are tolerating the medication after one month, call our office to get a prescription for the 10 mg tablets. Look out for nausea, weight loss, diarrhea.

## 2013-03-29 NOTE — Progress Notes (Signed)
Reason for visit: Memory disturbance  Heather Floyd is a 79 y.o. female  History of present illness:  Heather Floyd is a 79 year old right-handed white female with a history of a progressive memory disturbance that the family began to notice about one year ago. Over the last 6 months, the memory issues have become more significant. The patient currently lives with her sister. The patient's sister also complains of memory issues. The patient's mother died with Alzheimer's disease at age 46. The patient has noted some problems with remembering recent events. The patient will repeat herself frequently, and she will misplace things about the house. The patient had to give up the finances 6 months ago as she was making errors. The patient still operates a motor vehicle, and she claims that she has no problems with directions. The family is unaware of any safety issues with driving. The patient reports that she is able to keep up with appointments and with her medications. The patient does not have any problems with fatigue, or excessive daytime drowsiness. The patient reports no numbness or weakness of the face, arms, or legs. The patient denies any balance issues or problems controlling the bowels or the bladder. The patient has undergone a recent MRI scan of the brain that shows mild small vessel disease. A thyroid profile has been done recently that was unremarkable. The patient is sent to this office for further evaluation.  Past Medical History  Diagnosis Date  . Hyperlipidemia   . Hypertension   . RLS (restless legs syndrome)   . Sleep disorder   . Personal history of colonic polyps   . Osteoporosis   . DJD (degenerative joint disease) of cervical spine   . Varicose veins   . TIA (transient ischemic attack)   . Carotid artery occlusion   . Memory disturbance 03/29/2013    Past Surgical History  Procedure Laterality Date  . Anterior and posterior vaginal repair w/ sacrospinous ligament  suspension    . Endovenous ablation saphenous vein w/ laser    . Breast reduction surgery    . Cesarean section    . Hemorrhoid surgery      Family History  Problem Relation Age of Onset  . Mental illness Mother   . Alzheimer's disease Mother   . Cancer Father   . Bone cancer Father   . Dementia Sister     Social history:  reports that she quit smoking about 32 years ago. She has never used smokeless tobacco. She reports that she does not drink alcohol or use illicit drugs.  Medications:  Current Outpatient Prescriptions on File Prior to Visit  Medication Sig Dispense Refill  . aspirin 325 MG tablet Take 325 mg by mouth daily.        . cholecalciferol (VITAMIN D) 1000 UNITS tablet Take 1,000 Units by mouth daily.        . cyanocobalamin 500 MCG tablet Take 500 mcg by mouth daily.       . fenofibrate micronized (LOFIBRA) 200 MG capsule Take 200 mg by mouth daily before breakfast.      . lisinopril (PRINIVIL,ZESTRIL) 20 MG tablet Take 20 mg by mouth daily.        . simvastatin (ZOCOR) 80 MG tablet Take 80 mg by mouth daily.       No current facility-administered medications on file prior to visit.      Allergies  Allergen Reactions  . Codeine Nausea Only    ROS:  Out of  a complete 14 system review of symptoms, the patient complains only of the following symptoms, and all other reviewed systems are negative.  Memory loss, confusion Dizziness, syncope  Blood pressure 178/94, pulse 100, height 5' 1.5" (1.562 m), weight 117 lb (53.071 kg).  Physical Exam  General: The patient is alert and cooperative at the time of the examination.  Head: Pupils are equal, round, and reactive to light. Discs are flat bilaterally.  Neck: The neck is supple, no carotid bruits are noted.  Respiratory: The respiratory examination is clear.  Cardiovascular: The cardiovascular examination reveals a regular rate and rhythm, no obvious murmurs or rubs are noted.  Skin: Extremities are  without significant edema.  Neurologic Exam  Mental status: Mini-Mental status examination done today shows a total score of 18/30.  Cranial nerves: Facial symmetry is present. There is good sensation of the face to pinprick and soft touch bilaterally. The strength of the facial muscles and the muscles to head turning and shoulder shrug are normal bilaterally. Speech is well enunciated, no aphasia or dysarthria is noted. Extraocular movements are full. Visual Rester are full.  Motor: The motor testing reveals 5 over 5 strength of all 4 extremities. Good symmetric motor tone is noted throughout.  Sensory: Sensory testing is intact to pinprick, soft touch, vibration sensation, and position sense on all 4 extremities. No evidence of extinction is noted.  Coordination: Cerebellar testing reveals good finger-nose-finger and heel-to-shin bilaterally. The patient has not had any significant issues with apraxia with the use of the extremities.  Gait and station: Gait is slightly wide-based. Tandem gait is slightly unsteady. Romberg is negative. No drift is seen.  Reflexes: Deep tendon reflexes are symmetric and normal bilaterally. Toes are downgoing bilaterally.   Assessment/Plan:  One. Progressive memory disturbance  The patient is already scoring in the moderate range of dementia. The driving issue will need to be curtailed at the first sign of safety issues. The family is to monitor this closely. The patient will be placed on low-dose Aricept, and she will go for further blood work today. After one month, the family will contact me if she is tolerating the medication, the dose of the Aricept will be increased to a 10 mg tablet. The patient will followup in 6 months for an evaluation.   [name] MD 03/29/2013 5:23 PM  [facility] [address] [place], [place] [address]  Phone [phone] Fax [phone]

## 2013-03-30 LAB — VITAMIN B12: Vitamin B-12: 709 pg/mL (ref 211–946)

## 2013-03-30 LAB — RPR: RPR: NONREACTIVE

## 2013-03-31 NOTE — Progress Notes (Signed)
Quick Note:  Call pt about her blood work results, pt verbalized understanding. ______

## 2013-05-04 ENCOUNTER — Emergency Department (HOSPITAL_COMMUNITY): Payer: Medicare Other

## 2013-05-04 ENCOUNTER — Emergency Department (HOSPITAL_COMMUNITY)
Admission: EM | Admit: 2013-05-04 | Discharge: 2013-05-04 | Disposition: A | Discharge status: Home / Self Care | Payer: Medicare Other | Attending: Emergency Medicine | Admitting: Emergency Medicine

## 2013-05-04 DIAGNOSIS — Z79899 Other long term (current) drug therapy: Secondary | ICD-10-CM | POA: Insufficient documentation

## 2013-05-04 DIAGNOSIS — Z860100 Personal history of colon polyps, unspecified: Secondary | ICD-10-CM | POA: Insufficient documentation

## 2013-05-04 DIAGNOSIS — Z8669 Personal history of other diseases of the nervous system and sense organs: Secondary | ICD-10-CM | POA: Insufficient documentation

## 2013-05-04 DIAGNOSIS — I10 Essential (primary) hypertension: Secondary | ICD-10-CM | POA: Insufficient documentation

## 2013-05-04 DIAGNOSIS — R6889 Other general symptoms and signs: Secondary | ICD-10-CM | POA: Insufficient documentation

## 2013-05-04 DIAGNOSIS — Z8739 Personal history of other diseases of the musculoskeletal system and connective tissue: Secondary | ICD-10-CM | POA: Insufficient documentation

## 2013-05-04 DIAGNOSIS — E785 Hyperlipidemia, unspecified: Secondary | ICD-10-CM | POA: Insufficient documentation

## 2013-05-04 DIAGNOSIS — F0390 Unspecified dementia without behavioral disturbance: Secondary | ICD-10-CM | POA: Insufficient documentation

## 2013-05-04 DIAGNOSIS — R0989 Other specified symptoms and signs involving the circulatory and respiratory systems: Secondary | ICD-10-CM

## 2013-05-04 DIAGNOSIS — Z8673 Personal history of transient ischemic attack (TIA), and cerebral infarction without residual deficits: Secondary | ICD-10-CM | POA: Insufficient documentation

## 2013-05-04 DIAGNOSIS — Z87891 Personal history of nicotine dependence: Secondary | ICD-10-CM | POA: Insufficient documentation

## 2013-05-04 MED ORDER — GI COCKTAIL ~~LOC~~
30.0000 mL | Freq: Once | ORAL | Status: AC
  Administered 2013-05-04: 30 mL via ORAL
  Filled 2013-05-04: qty 30

## 2013-05-04 NOTE — ED Provider Notes (Signed)
I saw and evaluated the patient, reviewed the resident's note and I agree with the findings and plan. If applicable, I agree with the resident's interpretation of the EKG.  If applicable, I was present for critical portions of any procedures performed.  Choked on candy. No apnea, no cyanosis, no syncope.  Resolved with heimlich and glucagon.  CTAB, RRR. Looks well. Passed RN swallow study here, tolerating PO.   Glynn Octave, MD 05/04/13 2007

## 2013-05-04 NOTE — ED Notes (Signed)
Patient states that she choked on a piece of butterscotch candy.  States that she inadvertently swallowed it.  States that she went to the neighbors for help.  States that the choking got worse and the neighbor called EMS.  States that her breathing got worse and she was fearful that she would not be able to breathe any more.

## 2013-05-04 NOTE — ED Provider Notes (Signed)
CSN: [id number]     Arrival date & time 05/04/13  1654 History   First MD Initiated Contact with Patient 05/04/13 1732     Chief Complaint  Patient presents with  . Choking   (Consider location/radiation/quality/duration/timing/severity/associated sxs/prior Treatment) HPI Patient to [facility] ED via EMS with C/O choking on a piece of candy. Onset was sudden, just prior to arrival.  The pain is currently none. Modifying factors: EMS called and after Heimlich, 2 mg glucagon pt coughed and expectorated piece of candy.  Associated symptoms: none. No current chest pain, trouble breathing or trouble swallowing.  Recent medical care: EMS also gave 2.5 mg versed. This had never happened to patient before.       Past Medical History  Diagnosis Date  . Hyperlipidemia   . Hypertension   . RLS (restless legs syndrome)   . Sleep disorder   . Personal history of colonic polyps   . Osteoporosis   . DJD (degenerative joint disease) of cervical spine   . Varicose veins   . TIA (transient ischemic attack)   . Carotid artery occlusion   . Memory disturbance 03/29/2013   Past Surgical History  Procedure Laterality Date  . Anterior and posterior vaginal repair w/ sacrospinous ligament suspension    . Endovenous ablation saphenous vein w/ laser    . Breast reduction surgery    . Cesarean section    . Hemorrhoid surgery     Family History  Problem Relation Age of Onset  . Mental illness Mother   . Alzheimer's disease Mother   . Cancer Father   . Bone cancer Father   . Dementia Sister    History  Substance Use Topics  . Smoking status: Former Smoker    Quit date: 06/16/1980  . Smokeless tobacco: Never Used  . Alcohol Use: No   OB History   Grav Para Term Preterm Abortions TAB SAB Ect Mult Living                 Review of Systems Constitutional: Negative for fever.  Eyes: Negative for vision loss.  ENT: Negative for bleeding.  Cardiovascular: Negative for chest pain. Respiratory:  Negative for respiratory distress.  Gastrointestinal:  Negative for vomiting.  Genitourinary: Negative for inability to void.  Musculoskeletal: Negative for gait problem.  Integumentary: Negative for rash.  Neurological: Negative for new focal weakness.     Allergies  Aspirin and Codeine  Home Medications   Current Outpatient Rx  Name  Route  Sig  Dispense  Refill  . alendronate (FOSAMAX) 70 MG tablet   Oral   Take 70 mg by mouth once a week. Take with a full glass of water on an empty stomach. On sundays         . Cholecalciferol (VITAMIN D3) 2000 UNITS TABS   Oral   Take 2,000 Units by mouth daily.         . Cholecalciferol (VITAMIN D3) 400 UNITS tablet   Oral   Take 400 Units by mouth daily.         . cyanocobalamin 500 MCG tablet   Oral   Take 500 mcg by mouth daily as needed (for energy).          Marland Kitchen donepezil (ARICEPT) 5 MG tablet   Oral   Take 1 tablet (5 mg total) by mouth at bedtime.   30 tablet   1   . fenofibrate micronized (LOFIBRA) 200 MG capsule   Oral   Take 200  mg by mouth daily before breakfast.         . lisinopril (PRINIVIL,ZESTRIL) 20 MG tablet   Oral   Take 20 mg by mouth every morning.          . Misc Natural Products (GLUCOSAMINE-CHONDROITIN DS) TABS   Oral   Take 1 tablet by mouth 2 (two) times a week.         . Multiple Vitamin (MULTIVITAMIN WITH MINERALS) TABS tablet   Oral   Take 1 tablet by mouth every morning.         . simvastatin (ZOCOR) 80 MG tablet   Oral   Take 80 mg by mouth every morning.          . vitamin B-12 (CYANOCOBALAMIN) 500 MCG tablet   Oral   Take 500 mcg by mouth daily as needed (for energy).         . vitamin C (ASCORBIC ACID) 500 MG tablet   Oral   Take 500 mg by mouth daily.          BP 155/84  Pulse 101  Temp(Src) 98.2 F (36.8 C) (Oral)  Resp 20  SpO2 95% Physical Exam Nursing note and vitals reviewed.  Constitutional: Pt is alert and appears stated age. Eyes: No  injection, no scleral icterus. HENT: Atraumatic, airway open without erythema or exudate.  Respiratory: No respiratory distress. Equal breathing bilaterally. Cardiovascular: Normal rate. Extremities warm and well perfused.  Abdomen: Soft, non-tender. MSK: Extremities are atraumatic without deformity. Skin: No rash, no wounds.   Neuro: No motor nor sensory deficit.     ED Course  Procedures (including critical care time) Labs Review Labs Reviewed - No data to display Imaging Review Dg Neck Soft Tissue  05/04/2013   CLINICAL DATA:  Choking sensation.  Shortness of breath.  EXAM: NECK SOFT TISSUES - 1+ VIEW  COMPARISON:  No comparison studies available.  FINDINGS: A lateral view of the neck using soft tissue technique shows no prevertebral soft tissue swelling. No evidence for unexpected gas within the prevertebral soft tissues. The epiglottis is normal. Visualized bony structures show no worrisome lytic or sclerotic osseous abnormality. Loss of intervertebral disc height is seen at C5-6 and C6-7.  IMPRESSION: No soft tissue abnormality identified in the neck.   Electronically Signed   By: Kennith Center M.D.   On: 05/04/2013 18:39   Dg Chest 2 View  05/04/2013   CLINICAL DATA:  Choking sensation  EXAM: CHEST  2 VIEW  COMPARISON:  01/12/2013  FINDINGS: Lungs are hyperexpanded. Interstitial markings are diffusely coarsened with chronic features. No pulmonary edema or focal lung consolidation. The cardiopericardial silhouette is within normal limits for size. Bones are diffusely demineralized.  IMPRESSION: Stable.  No acute cardiopulmonary findings.   Electronically Signed   By: Kennith Center M.D.   On: 05/04/2013 18:42    EKG Interpretation   None       MDM   1. Choking episode    79 y.o. female w/ PMHx of mild dementia presents after choking episode. Object described as piece of candy by patient now gone. Pt looks well, normal exam. Imaging without foreign body or other abnormality.  Pt able to tolerate PO here. Here with family. Will d/c home with appropriate instructions. Counseling provided regarding diagnosis, treatment plan, follow up recommendations, and return precautions. Questions answered.       I independently viewed, interpreted, and used in my medical decision making all ordered lab and imaging tests. Medical  Decision Making discussed with ED attending Glynn Octave, MD      Charm Barges, MD 05/04/13 [phone]

## 2013-05-04 NOTE — ED Notes (Signed)
Patient to [facility] ED via EMS with C/O choking on a pill.  Heimlich X 3 per EMS EMS reports gurgling post Heimlich maneuver.  2 mg glucagon given. 2.5 mg versed given IV. Patient coughed and expectorated the pill.

## 2013-05-04 NOTE — Discharge Instructions (Signed)
Choking, Adult  Choking occurs when a food or object gets stuck in the throat or trachea, blocking the airway. If the airway is partly blocked, coughing will usually cause the food or object to come out. If the airway is completely blocked, immediate action is needed to help it come out. A complete airway blockage is life-threatening because it causes breathing to stop. Choking is a true medical emergency that requires fast, appropriate action by anyone available.  SIGNS OF AIRWAY BLOCKAGE  There is a partial airway blockage if you or the person who is choking is:    Able to breathe and speak.   Coughing loudly.   Making loud noises.  There is a complete airway blockage if you or the person who is choking is:    Unable to breathe.   Making soft or high-pitched sounds while breathing.   Unable to cough or coughing weakly, ineffectively, or silently.   Unable to cry, speak, or make sounds.   Turning blue.   Holding the neck with both arms. This is the universal sign of choking.  WHAT TO DO IF CHOKING OCCURS  If there is a partial airway blockage, allow coughing to clear the airway. Do not try to drink until the food or object comes out. If someone else has a partial airway blockage, do not interfere. Stay with him or her and watch for signs of complete airway blockage until the food or object comes out.   If there is a complete airway blockage or if there is a partial airway blockage and the food or object does not come out, perform abdominal thrusts (also referred to as the Heimlich maneuver). Abdominal thrusts are used to create an artificial cough to try to clear the airway. Performing abdominal thrusts is part of a series of steps that should be done to help someone who is choking. Abdominal thrusts are usually done by someone else, but if you are alone, you can perform abdominal thrusts on yourself. Follow the procedure below that best fits your situation.   IF SOMEONE ELSE IS CHOKING:  For a conscious  adult:   1. Ask the person whether he or she is choking. If the person nods, continue to step 2.  2. Stand or kneel behind the person and lean him or her forward slightly.  3. Make a fist with 1 hand, put your arms around the person, and grasp your fist with your other hand. Place the thumb side of your fist in the person's abdomen, just below the ribs.  4. Press inward and upward with both hands.  5. Repeat this maneuver until the object comes out and the person is able to breathe or until the person loses consciousness.  For an unconcious adult:  1. Shout for help. If someone responds, have him or her call local emergency services (911 in [place] If no one responds, call local emergency services yourself if possible.  2. Begin CPR, starting with compressions. Every time you open the airway to give rescue breaths, open the person's mouth. If you can see the food or object and it can be easily pulled out, remove it with your fingers.  3. After 5 cycles or 2 minutes of CPR, call local emergency services (911 in [place] if you or someone else did not already call.  For a conscious adult who is obese or in the later stages of pregnancy:  Abdominal thrusts may not be effective when helping people who are in the later stages   of pregnancy or who are obese. In these instances, chest thrusts can be used.   1. Ask the person whether he or she is choking. If the person nods and has signs of complete airway blockage, continue to step 2.  2. Stand behind the person and wrap your arms around his or her chest (with your arms under the person's armpits).  3. Make a fist with 1 hand. Place the thumb side of your fist on the middle of the person's breastbone.  4. Grab your fist with your other hand and thrust backward. Continue this until the object comes out or until the person becomes unconscious.  For an unconscious adult who is obese or in the later stages of pregnancy:   1. Shout for help. If someone responds, have him or her call  local emergency services (911 in [place] If no one responds, call local emergency services yourself if possible.  2. Begin CPR, starting with compressions. Every time you open the airway to give rescue breaths, open the person's mouth. If you can see the food or object and it can be easily pulled out, remove it with your fingers.  3. After 5 cycles or 2 minutes of CPR, call local emergency services (911 in [place] if you or someone else did not already call.  Note that abdominal thrusts (below the rib cage) should be used for a pregnant woman if possible. This should be possible until the later stages of pregnancy when there is no longer enough room between the enlarging uterus and the rib cage to perform the maneuver. At that point, chest thrusts must be used as described.  IF YOU ARE CHOKING:  1. Call local emergency services (911 in [place] if near a landline. Do not worry about communicating what is happening. Do not hang up the phone. Someone may be sent to help you anyway.  2. Make a fist with 1 hand. Put the thumb side of the fist against your stomach, just above the belly button and well below the breastbone. If you are pregnant or obese, put your fist on your chest instead, just below the breastbone and just above your lowest ribs.  3. Hold your fist with your other hand and bend over a hard surface, such as a table or chair.  4. Forcefully push your fist in and up.  5. Continue to do this until the food or object comes out.  PREVENTION   To be prepared if choking occurs, learn how to correctly perform abdominal thrusts and give CPR by taking a certified first-aid training course.   SEEK IMMEDIATE MEDICAL CARE IF:   You have a fever after choking stops.   You have problems breathing after choking stops.   You received the Heimlich maneuver.  MAKE SURE YOU:   Understand these instructions.   Watch your condition.   Get help right away if you are not doing well or get worse.  Document Released: 07/10/2004  Document Revised: 02/25/2012 Document Reviewed: 01/13/2012  [employer] Patient Information 2014 [employer].

## 2013-05-26 ENCOUNTER — Other Ambulatory Visit: Payer: Self-pay | Admitting: Neurology

## 2013-05-27 NOTE — Telephone Encounter (Signed)
I have tried to call patient multiple times, to no avail.  Last OV says if she tolerates the 5mg , we may increase her to 10mg .  Got no answer, no VM each time I called.  Will auth 5mg  refill so patient is not without meds and noted Rx asking patient to call us.

## 2013-07-31 ENCOUNTER — Other Ambulatory Visit: Payer: Self-pay | Admitting: Neurology

## 2013-08-01 NOTE — Telephone Encounter (Signed)
Patient says she was taking this med, but did not know if she should still be taking it.  Says she would like to stay on low dose for now and will call us back if adjustment is needed.  She has an appt in April

## 2013-09-15 ENCOUNTER — Other Ambulatory Visit: Payer: Self-pay | Admitting: Neurology

## 2013-09-15 NOTE — Telephone Encounter (Signed)
Patient says she would like to stay on low dose for now and will call us back if adjustment is needed. She has an appt this month

## 2013-09-27 ENCOUNTER — Ambulatory Visit (INDEPENDENT_AMBULATORY_CARE_PROVIDER_SITE_OTHER): Payer: Medicare Other | Admitting: Neurology

## 2013-09-27 ENCOUNTER — Encounter: Payer: Self-pay | Admitting: Neurology

## 2013-09-27 ENCOUNTER — Encounter (INDEPENDENT_AMBULATORY_CARE_PROVIDER_SITE_OTHER): Payer: Self-pay

## 2013-09-27 VITALS — BP 182/95 | HR 88 | Ht 61.5 in | Wt 112.0 lb

## 2013-09-27 DIAGNOSIS — R413 Other amnesia: Secondary | ICD-10-CM

## 2013-09-27 MED ORDER — MEMANTINE HCL 28 X 5 MG & 21 X 10 MG PO TABS: ORAL_TABLET | ORAL | Status: DC

## 2013-09-27 NOTE — Progress Notes (Signed)
PATIENT: Heather Floyd DOB: 02/03/1933  REASON FOR VISIT: follow up HISTORY FROM: patient  HISTORY OF PRESENT ILLNESS: Heather Floyd is a 80 year old right-handed white female with history of progressive memory disturbance that returns today for follow-up. At the last visit the patient was started on Aricept and family reports that it changed her personality. For example the patient became very mean/grumpy according to family. Daughter reports that she is unsure if the patient was taking the medication consistently. Patient reports that she drives very seldom, usually rides with family or friends to places. Patient states that if she does drive its only to the mailbox or to [employer]. Family reports that she asks a lot of repetitive questions. Patient's daughter lives with her so she does not cook unless daughter is present. Denies any problems with sleep, reports that occasionally she will get up to use the bathroom.  Heather Floyd was taken to the Emergency Room 05/04/2013 due to choking on a piece of candy. Patient reports she was out in the yard talking to neighbors and was chewing on a piece of candy when she suddenly starting choking. Since then she has had no issues with swallowing. Patient is out of her all of medications, family will be calling primary care physician to get medication refilled.   REVIEW OF SYSTEMS: Full 14 system review of systems performed and notable only for:  Constitutional: N/A  Cardiovascular: N/A  Ear/Nose/Throat: N/A  Skin: N/A  Eyes: N/A  Respiratory: N/A  Gastrointestinal: N/A  Genitourinary: N/A Hematology/Lymphatic: N/A  Endocrine: N/A Musculoskeletal:N/A  Allergy/Immunology: N/A  Neurological: memory issues Psychiatric: N/A Sleep: restless leg   ALLERGIES: Allergies  Allergen Reactions  . Aspirin Other (See Comments)    Makes blood too thin  . Codeine Nausea Only    HOME MEDICATIONS: Outpatient Prescriptions Prior to Visit  Medication Sig  Dispense Refill  . alendronate (FOSAMAX) 70 MG tablet Take 70 mg by mouth once a week. Take with a full glass of water on an empty stomach. On sundays      . Cholecalciferol (VITAMIN D3) 2000 UNITS TABS Take 2,000 Units by mouth daily.      . Cholecalciferol (VITAMIN D3) 400 UNITS tablet Take 400 Units by mouth daily.      . cyanocobalamin 500 MCG tablet Take 500 mcg by mouth daily as needed (for energy).       Marland Kitchen. donepezil (ARICEPT) 5 MG tablet TAKE 1 TABLET BY MOUTH AT BEDTIME  30 tablet  0  . fenofibrate micronized (LOFIBRA) 200 MG capsule Take 200 mg by mouth daily before breakfast.      . lisinopril (PRINIVIL,ZESTRIL) 20 MG tablet Take 20 mg by mouth every morning.       . Misc Natural Products (GLUCOSAMINE-CHONDROITIN DS) TABS Take 1 tablet by mouth 2 (two) times a week.      . Multiple Vitamin (MULTIVITAMIN WITH MINERALS) TABS tablet Take 1 tablet by mouth every morning.      . simvastatin (ZOCOR) 80 MG tablet Take 80 mg by mouth every morning.       . vitamin B-12 (CYANOCOBALAMIN) 500 MCG tablet Take 500 mcg by mouth daily as needed (for energy).      . vitamin C (ASCORBIC ACID) 500 MG tablet Take 500 mg by mouth daily.      Marland Kitchen. donepezil (ARICEPT) 5 MG tablet TAKE 1 TABLET BY MOUTH AT BEDTIME  90 tablet  0   No facility-administered medications prior to visit.  PHYSICAL EXAM  Filed Vitals:   09/27/13 0752  BP: 182/95  Pulse: 88  Height: 5' 1.5" (1.562 m)  Weight: 112 lb (50.803 kg)   Body mass index is 20.82 kg/(m^2).  Generalized: Well developed, in no acute distress  Head: normocephalic and atraumatic.     Neurological examination  Mentation: Alert oriented to time, place, history taking. Follows all commands speech and language fluent. MMSE this visit was 18/30, no change from last visit. Cranial nerve II-XII: Pupils were equal round reactive to light extraocular movements were full, visual field were full on confrontational test. Facial sensation and strength were  normal. head turning and shoulder shrug and were normal and symmetric. Motor: The motor testing reveals 5 over 5 strength of all 4 extremities. Good symmetric motor tone is noted throughout.  Sensory: Sensory testing is intact to pinprick, soft touch, vibration sensation, and position sense on all 4 extremities. No evidence of extinction is noted.  Coordination: Cerebellar testing reveals good finger-nose-finger and heel-to-shin bilaterally.  Gait and station: Gait is normal. Tandem gait is normal. Romberg is negative. No drift is seen.  Reflexes: Deep tendon reflexes are symmetric and normal bilaterally.   DIAGNOSTIC DATA (LABS, IMAGING, TESTING) - I reviewed patient records, labs, notes, testing and imaging myself where available.  Lab Results  Component Value Date   HGBA1C 5.1 01/14/2013   Lab Results  Component Value Date   VITAMINB12 709 03/29/2013   Lab Results  Component Value Date   TSH 1.898 01/14/2013    ASSESSMENT AND PLAN 80 y.o. year old female  has a past medical history of Hyperlipidemia; Hypertension; RLS (restless legs syndrome); Sleep disorder; Personal history of colonic polyps; Osteoporosis; DJD (degenerative joint disease) of cervical spine; Varicose veins; TIA (transient ischemic attack); Carotid artery occlusion; and Memory disturbance (03/29/2013). Here today for:  1. Memory disturbance  Begin taking Namenda as directed.  Follow up in 6 months or sooner if needed.    No orders of the defined types were placed in this encounter.    No orders of the defined types were placed in this encounter.   No Follow-up on file.  Butch PennyMegan Kada Friesen, MSN, NP-C 09/27/2013, 8:00 AM [facility] [address], [address] [place], [place] [address] [phone]  Note: This document was prepared with digital dictation and possible smart phrase technology. Any transcriptional errors that result from this process are unintentional. York Spanielharles K Willis

## 2013-09-27 NOTE — Patient Instructions (Signed)
Memantine Tablets What is this medicine? MEMANTINE (MEM an teen) is used to treat dementia caused by Alzheimer's disease. This medicine may be used for other purposes; ask your health care provider or pharmacist if you have questions. COMMON BRAND NAME(S): Namenda What should I tell my health care provider before I take this medicine? They need to know if you have any of these conditions: -difficulty passing urine -kidney disease -liver disease -seizures -an unusual or allergic reaction to memantine, other medicines, foods, dyes, or preservatives -pregnant or trying to get pregnant -breast-feeding How should I use this medicine? Take this medicine by mouth with a glass of water. Follow the directions on the prescription label. You may take this medicine with or without food. Take your doses at regular intervals. Do not take your medicine more often than directed. Continue to take your medicine even if you feel better. Do not stop taking except on the advice of your doctor or health care professional. Talk to your pediatrician regarding the use of this medicine in children. Special care may be needed. Overdosage: If you think you have taken too much of this medicine contact a poison control center or emergency room at once. NOTE: This medicine is only for you. Do not share this medicine with others. What if I miss a dose? If you miss a dose, take it as soon as you can. If it is almost time for your next dose, take only that dose. Do not take double or extra doses. If you do not take your medicine for several days, contact your health care provider. Your dose may need to be changed. What may interact with this medicine? -acetazolamide -amantadine -cimetidine -dextromethorphan -dofetilide -hydrochlorothiazide -ketamine -metformin -methazolamide -quinidine -ranitidine -sodium bicarbonate -triamterene This list may not describe all possible interactions. Give your health care provider a  list of all the medicines, herbs, non-prescription drugs, or dietary supplements you use. Also tell them if you smoke, drink alcohol, or use illegal drugs. Some items may interact with your medicine. What should I watch for while using this medicine? Visit your doctor or health care professional for regular checks on your progress. Check with your doctor or health care professional if there is no improvement in your symptoms or if they get worse. You may get drowsy or dizzy. Do not drive, use machinery, or do anything that needs mental alertness until you know how this drug affects you. Do not stand or sit up quickly, especially if you are an older patient. This reduces the risk of dizzy or fainting spells. Alcohol can make you more drowsy and dizzy. Avoid alcoholic drinks. What side effects may I notice from receiving this medicine? Side effects that you should report to your doctor or health care professional as soon as possible: -allergic reactions like skin rash, itching or hives, swelling of the face, lips, or tongue -agitation or a feeling of restlessness -depressed mood -dizziness -hallucinations -redness, blistering, peeling or loosening of the skin, including inside the mouth -seizures -vomiting Side effects that usually do not require medical attention (report to your doctor or health care professional if they continue or are bothersome): -constipation -diarrhea -headache -nausea -trouble sleeping This list may not describe all possible side effects. Call your doctor for medical advice about side effects. You may report side effects to FDA at 1-800-FDA-1088. Where should I keep my medicine? Keep out of the reach of children. Store at room temperature between 15 degrees and 30 degrees C (59 degrees and 86 degrees   F). Throw away any unused medicine after the expiration date. NOTE: This sheet is a summary. It may not cover all possible information. If you have questions about this  medicine, talk to your doctor, pharmacist, or health care provider.  2014, Elsevier/Gold Standard. (2013-03-21 14:10:42)  

## 2013-10-24 ENCOUNTER — Telehealth: Payer: Self-pay | Admitting: Neurology

## 2013-10-24 MED ORDER — MEMANTINE HCL ER 28 MG PO CP24: 28.0000 mg | ORAL_CAPSULE | Freq: Every day | ORAL | Status: DC

## 2013-10-24 NOTE — Telephone Encounter (Signed)
Patient's daughter Bonita QuinLinda calling to report that patient is tolerating Namenda trial pack well, and will finish the trial this Thursday or Friday. Patient's daughter requesting that a script be sent to the [facility] on [address]. If questions, please call Bonita QuinLinda.

## 2013-10-24 NOTE — Telephone Encounter (Signed)
Rx has been sent  

## 2014-01-02 ENCOUNTER — Telehealth: Payer: Self-pay | Admitting: Neurology

## 2014-01-02 ENCOUNTER — Encounter: Payer: Self-pay | Admitting: Neurology

## 2014-01-02 NOTE — Telephone Encounter (Signed)
Patient's daughter Heather Floyd is calling--patient got a letter for jury duty and daughter states patient has moderate dementia and cannot serve on jury duty--please write a letter stating why patient cannot serve and to have her permanently removed from the list--please call patient's daughter and advise--thank you.

## 2014-01-02 NOTE — Telephone Encounter (Signed)
I called the daughter. I will dictate a letter, no need to bring in the summons for jury duty.

## 2014-01-02 NOTE — Telephone Encounter (Signed)
Called pt and spoke with pt's daughter Bonita QuinLinda informing her if she could bring the summons of the jury duty, for us to make a copy and information would be given to Dr. Anne HahnWillis to consider writing the letter. Daughter verbalized understanding. FYI

## 2014-05-03 ENCOUNTER — Encounter: Payer: Self-pay | Admitting: Neurology

## 2014-05-09 ENCOUNTER — Encounter: Payer: Self-pay | Admitting: Neurology

## 2014-05-29 ENCOUNTER — Ambulatory Visit (INDEPENDENT_AMBULATORY_CARE_PROVIDER_SITE_OTHER): Payer: Medicare Other | Admitting: Neurology

## 2014-05-29 ENCOUNTER — Encounter: Payer: Self-pay | Admitting: Neurology

## 2014-05-29 VITALS — BP 182/88 | HR 83 | Ht 62.0 in | Wt 114.8 lb

## 2014-05-29 DIAGNOSIS — R413 Other amnesia: Secondary | ICD-10-CM

## 2014-05-29 MED ORDER — MEMANTINE HCL ER 28 MG PO CP24: 28.0000 mg | ORAL_CAPSULE | Freq: Every day | ORAL | Status: DC

## 2014-05-29 NOTE — Patient Instructions (Signed)
Alzheimer Disease Alzheimer disease is a mental disorder. It causes memory loss and loss of other mental functions, such as learning, thinking, problem solving, communicating, and completing tasks. The mental losses interfere with the ability to perform daily activities at work, at home, or in social situations. Alzheimer disease usually starts in a person's late 60s or early 70s but can start earlier in life (familial form). The mental changes caused by this disease are permanent and worsen over time. As the illness progresses, the ability to do even the simplest things is lost. Survival with Alzheimer disease ranges from several years to as long as 20 years. CAUSES Alzheimer disease is caused by abnormally high levels of a protein (beta-amyloid) in the brain. This protein forms very small deposits within and around the brain's nerve cells. These deposits prevent the nerve cells from working properly. Experts are not certain what causes the beta-amyloid deposits in this disease. RISK FACTORS The following major risk factors have been identified:  Increasing age.  Certain genetic variations, such as Down syndrome (trisomy 21). SYMPTOMS In the early stages of Alzheimer disease, you are still able to perform daily activities but need greater effort, more time, or memory aids. Early symptoms include:  Mild memory loss of recent events, names, or phone numbers.  Loss of objects.  Minor loss of vocabulary.  Difficulty with complex tasks, such as paying bills or driving in unfamiliar locations. Other mental functions deteriorate as the disease worsens. These changes slowly go from mild to severe. Symptoms at this stage include:  Difficulty remembering. You may not be able to recall personal information such as your address and telephone number. You may become confused about the date, the season of the year, or your location.  Difficulty maintaining attention. You may forget what you wanted to say  during conversations and repeat what you have already said.  Difficulty learning new information or tasks. You may not remember what you read or the name of a new friend you met.  Difficulty counting or doing math. You may have difficulty with complex math problems. You may make mistakes in paying bills or managing your checkbook.  Poor reasoning and judgment. You may make poor decisions or not dress right for the weather.  Difficulty communicating. You may have regular difficulty remembering words, naming objects, expressing yourself clearly, or writing sentences that make sense.  Difficulty performing familiar daily activities. You may get lost driving in familiar locations or need help eating, bathing, dressing, grooming, or using the toilet. You may have difficulty maintaining bladder or bowel control.  Difficulty recognizing familiar faces. You may confuse family members or close friends with one another. You may not recognize a close relative or may mistake strangers for family. Alzheimer disease also may cause changes in personality and behavior. These changes include:   Loss of interest or motivation.  Social withdrawal.  Anxiety.  Difficulty sleeping.  Uncharacteristic anger or combativeness.  A false belief that someone is trying to harm you (paranoia).  Seeing things that are not real (hallucinations).  Agitation. Confusion and disruptive behavior are often worse at night and may be triggered by changes in the environment or acute medical issues. DIAGNOSIS  Alzheimer disease is diagnosed through an assessment by your health care provider. During this assessment, your health care provider will do the following:  Ask you and your family, friends, or caregivers questions about your symptoms, their frequency, their duration and progression, and the effect they are having on your life.    Ask questions about your personal and family medical history and use of alcohol or drugs,  including prescription medicine.  Perform a physical exam and order blood tests and brain imaging exams. Your health care provider may refer you to a specialist for detailed evaluation of your mental functions (neuropsychological testing).  Many different brain disorders, medical conditions, and certain substances can cause symptoms that resemble Alzheimer disease symptoms. These must be ruled out before this disease can be diagnosed. If Alzheimer disease is diagnosed, it will be considered either "possible" or "probable" Alzheimer disease. "Possible" Alzheimer disease means that your symptoms are typical of the disease and no other disorder is causing them. "Probable" Alzheimer disease means that you also have a family history of the disease or genetic test results that support the diagnosis. Certain tests, mostly used in research studies, are highly specific for Alzheimer disease.  TREATMENT  There is currently no cure for this disease. The goals of treatment are to:  Slow down the progression of the disease.  Preserve mental function as long as possible.  Manage behavioral symptoms.  Make life easier for the person with Alzheimer disease and his or her caregivers. The following treatment options are available:  Medicine. Certain medicines may help slow memory loss by changing the level of certain chemicals in the brain. Medicine may also help with behavioral symptoms.  Talk therapy. Talk therapy provides education, support, and memory aids for people with this disease. It is most effective in the early stages of the illness.  Caregiving. Caregivers may be family members, friends, or trained medical professionals. They help the person with Alzheimer disease with daily life activities. Caregiving may take place at home or at a nursing facility.  Family support groups. These provide education, emotional support, and information about community resources to family members who are taking care of  the person with this disease. Document Released: 02/12/2004 Document Revised: 10/17/2013 Document Reviewed: 10/08/2012 [facility] Patient Information 2015 [place], [place]. This information is not intended to replace advice given to you by your health care provider. Make sure you discuss any questions you have with your health care provider.

## 2014-05-29 NOTE — Progress Notes (Signed)
Reason for visit: Memory disturbance  Heather Floyd is an 80 y.o. female  History of present illness:  [name] is a 80 year old right-handed white female with a history of a progressive memory disturbance. The patient could not tolerate Aricept as it resulted in too much agitation. The patient was placed on Namenda which is well tolerated. She seemed be doing well on the medication. She was last seen in April 2015, and there have been no significant changes in her cognitive functioning since that time. The patient still operates a motor vehicle for short distances. The patient's family indicates that she will drive down to the mailbox and back. The patient does not otherwise drive the car. She is sleeping well. She has assistance with her medications and with her finances, and she has significantly reduced the amount of time that she is cooking. The patient denies any other significant medical issues that have come up since last seen. She returns for an evaluation.  Past Medical History  Diagnosis Date  . Hyperlipidemia   . Hypertension   . RLS (restless legs syndrome)   . Sleep disorder   . Personal history of colonic polyps   . Osteoporosis   . DJD (degenerative joint disease) of cervical spine   . Varicose veins   . TIA (transient ischemic attack)   . Carotid artery occlusion   . Memory disturbance 03/29/2013    Past Surgical History  Procedure Laterality Date  . Anterior and posterior vaginal repair w/ sacrospinous ligament suspension    . Endovenous ablation saphenous vein w/ laser    . Breast reduction surgery    . Cesarean section    . Hemorrhoid surgery      Family History  Problem Relation Age of Onset  . Mental illness Mother   . Alzheimer's disease Mother   . Cancer Father   . Bone cancer Father   . Dementia Sister     Social history:  reports that she quit smoking about 33 years ago. She has never used smokeless tobacco. She reports that she does not  drink alcohol or use illicit drugs.    Allergies  Allergen Reactions  . Aspirin Other (See Comments)    Makes blood too thin  . Codeine Nausea Only    Medications:  Current Outpatient Prescriptions on File Prior to Visit  Medication Sig Dispense Refill  . alendronate (FOSAMAX) 70 MG tablet Take 70 mg by mouth once a week. Take with a full glass of water on an empty stomach. On sundays    . Cholecalciferol (VITAMIN D3) 2000 UNITS TABS Take 2,000 Units by mouth daily.    . Cholecalciferol (VITAMIN D3) 400 UNITS tablet Take 400 Units by mouth daily.    . fenofibrate micronized (LOFIBRA) 200 MG capsule Take 200 mg by mouth daily before breakfast.    . lisinopril (PRINIVIL,ZESTRIL) 20 MG tablet Take 20 mg by mouth every morning.     . Misc Natural Products (GLUCOSAMINE-CHONDROITIN DS) TABS Take 1 tablet by mouth 2 (two) times a week.    . Multiple Vitamin (MULTIVITAMIN WITH MINERALS) TABS tablet Take 1 tablet by mouth every morning.    . simvastatin (ZOCOR) 80 MG tablet Take 80 mg by mouth every morning.     . vitamin C (ASCORBIC ACID) 500 MG tablet Take 500 mg by mouth daily.    . cyanocobalamin 500 MCG tablet Take 500 mcg by mouth daily as needed (for energy).      No  current facility-administered medications on file prior to visit.    ROS:  Out of a complete 14 system review of symptoms, the patient complains only of the following symptoms, and all other reviewed systems are negative.  Memory loss  Blood pressure 182/88, pulse 83, height 5\' 2"  (1.575 m), weight 114 lb 12.8 oz (52.073 kg).  Physical Exam  General: The patient is alert and cooperative at the time of the examination.  Skin: No significant peripheral edema is noted.   Neurologic Exam  Mental status: The Mini-Mental Status Examination done today shows a total score 20/30.  Cranial nerves: Facial symmetry is present. Speech is normal, no aphasia or dysarthria is noted. Extraocular movements are full. Visual  Crew are full.  Motor: The patient has good strength in all 4 extremities.  Sensory examination: Soft touch sensation is symmetric on the face, arms, and legs.  Coordination: The patient has good finger-nose-finger and heel-to-shin bilaterally.  Gait and station: The patient has a normal gait. Tandem gait is slightly unsteady. Romberg is negative. No drift is seen.  Reflexes: Deep tendon reflexes are symmetric.   Assessment/Plan:  1. Memory disturbance  The patient seems to be relatively stable with her cognitive functioning level. She will continue the Namenda for now, a prescription was called in. She will follow-up in 8 months. The driving will need to be scrutinized closely. The patient seems to have adequate supervision in the home environment.  [name]. [name] MD 05/29/2014 8:54 PM  [facility] [address] [place], [place] [id number]  Phone [phone] Fax [phone]

## 2014-08-11 ENCOUNTER — Inpatient Hospital Stay (HOSPITAL_BASED_OUTPATIENT_CLINIC_OR_DEPARTMENT_OTHER)
Admission: EM | Admit: 2014-08-11 | Discharge: 2014-08-15 | DRG: 086 | Disposition: A | Discharge status: Skilled Nursing Facility | Payer: Medicare Other | Attending: Family Medicine | Admitting: Family Medicine

## 2014-08-11 ENCOUNTER — Other Ambulatory Visit (HOSPITAL_BASED_OUTPATIENT_CLINIC_OR_DEPARTMENT_OTHER): Payer: Self-pay

## 2014-08-11 ENCOUNTER — Emergency Department (HOSPITAL_BASED_OUTPATIENT_CLINIC_OR_DEPARTMENT_OTHER): Payer: Medicare Other

## 2014-08-11 DIAGNOSIS — S065XAA Traumatic subdural hemorrhage with loss of consciousness status unknown, initial encounter: Secondary | ICD-10-CM | POA: Diagnosis present

## 2014-08-11 DIAGNOSIS — G2581 Restless legs syndrome: Secondary | ICD-10-CM | POA: Diagnosis not present

## 2014-08-11 DIAGNOSIS — Y93K1 Activity, walking an animal: Secondary | ICD-10-CM

## 2014-08-11 DIAGNOSIS — M479 Spondylosis, unspecified: Secondary | ICD-10-CM | POA: Diagnosis not present

## 2014-08-11 DIAGNOSIS — I10 Essential (primary) hypertension: Secondary | ICD-10-CM | POA: Diagnosis not present

## 2014-08-11 DIAGNOSIS — S065X9A Traumatic subdural hemorrhage with loss of consciousness of unspecified duration, initial encounter: Secondary | ICD-10-CM

## 2014-08-11 DIAGNOSIS — Z7983 Long term (current) use of bisphosphonates: Secondary | ICD-10-CM | POA: Diagnosis not present

## 2014-08-11 DIAGNOSIS — S82009A Unspecified fracture of unspecified patella, initial encounter for closed fracture: Secondary | ICD-10-CM | POA: Diagnosis present

## 2014-08-11 DIAGNOSIS — N390 Urinary tract infection, site not specified: Secondary | ICD-10-CM | POA: Diagnosis not present

## 2014-08-11 DIAGNOSIS — S82002A Unspecified fracture of left patella, initial encounter for closed fracture: Secondary | ICD-10-CM

## 2014-08-11 DIAGNOSIS — G459 Transient cerebral ischemic attack, unspecified: Secondary | ICD-10-CM | POA: Diagnosis present

## 2014-08-11 DIAGNOSIS — Z87891 Personal history of nicotine dependence: Secondary | ICD-10-CM | POA: Diagnosis not present

## 2014-08-11 DIAGNOSIS — M810 Age-related osteoporosis without current pathological fracture: Secondary | ICD-10-CM | POA: Diagnosis not present

## 2014-08-11 DIAGNOSIS — M1990 Unspecified osteoarthritis, unspecified site: Secondary | ICD-10-CM | POA: Diagnosis not present

## 2014-08-11 DIAGNOSIS — R509 Fever, unspecified: Secondary | ICD-10-CM

## 2014-08-11 DIAGNOSIS — I6529 Occlusion and stenosis of unspecified carotid artery: Secondary | ICD-10-CM | POA: Diagnosis present

## 2014-08-11 DIAGNOSIS — S065X0A Traumatic subdural hemorrhage without loss of consciousness, initial encounter: Secondary | ICD-10-CM | POA: Diagnosis present

## 2014-08-11 DIAGNOSIS — W1831XA Fall on same level due to stepping on an object, initial encounter: Secondary | ICD-10-CM | POA: Diagnosis not present

## 2014-08-11 DIAGNOSIS — Z79899 Other long term (current) drug therapy: Secondary | ICD-10-CM

## 2014-08-11 DIAGNOSIS — M25562 Pain in left knee: Secondary | ICD-10-CM | POA: Diagnosis present

## 2014-08-11 DIAGNOSIS — Z8673 Personal history of transient ischemic attack (TIA), and cerebral infarction without residual deficits: Secondary | ICD-10-CM

## 2014-08-11 DIAGNOSIS — E785 Hyperlipidemia, unspecified: Secondary | ICD-10-CM | POA: Diagnosis not present

## 2014-08-11 DIAGNOSIS — I6200 Nontraumatic subdural hemorrhage, unspecified: Secondary | ICD-10-CM

## 2014-08-11 DIAGNOSIS — M47812 Spondylosis without myelopathy or radiculopathy, cervical region: Secondary | ICD-10-CM | POA: Diagnosis present

## 2014-08-11 DIAGNOSIS — T07XXXA Unspecified multiple injuries, initial encounter: Secondary | ICD-10-CM

## 2014-08-11 DIAGNOSIS — E876 Hypokalemia: Secondary | ICD-10-CM | POA: Diagnosis not present

## 2014-08-11 DIAGNOSIS — F0391 Unspecified dementia with behavioral disturbance: Secondary | ICD-10-CM | POA: Diagnosis present

## 2014-08-11 DIAGNOSIS — W19XXXA Unspecified fall, initial encounter: Secondary | ICD-10-CM

## 2014-08-11 LAB — CBC WITH DIFFERENTIAL/PLATELET
BASOS PCT: 1 % (ref 0–1)
Basophils Absolute: 0.1 10*3/uL (ref 0.0–0.1)
EOS ABS: 0.1 10*3/uL (ref 0.0–0.7)
EOS PCT: 1 % (ref 0–5)
HCT: 42.1 % (ref 36.0–46.0)
Hemoglobin: 14.9 g/dL (ref 12.0–15.0)
LYMPHS ABS: 1.5 10*3/uL (ref 0.7–4.0)
Lymphocytes Relative: 14 % (ref 12–46)
MCH: 30.7 pg (ref 26.0–34.0)
MCHC: 35.4 g/dL (ref 30.0–36.0)
MCV: 86.8 fL (ref 78.0–100.0)
Monocytes Absolute: 0.8 10*3/uL (ref 0.1–1.0)
Monocytes Relative: 7 % (ref 3–12)
NEUTROS PCT: 77 % (ref 43–77)
Neutro Abs: 8.4 10*3/uL — ABNORMAL HIGH (ref 1.7–7.7)
PLATELETS: 274 10*3/uL (ref 150–400)
RBC: 4.85 MIL/uL (ref 3.87–5.11)
RDW: 12.9 % (ref 11.5–15.5)
WBC: 10.9 10*3/uL — ABNORMAL HIGH (ref 4.0–10.5)

## 2014-08-11 LAB — BASIC METABOLIC PANEL
Anion gap: 7 (ref 5–15)
BUN: 10 mg/dL (ref 6–23)
CHLORIDE: 102 mmol/L (ref 96–112)
CO2: 29 mmol/L (ref 19–32)
CREATININE: 0.7 mg/dL (ref 0.50–1.10)
Calcium: 9.4 mg/dL (ref 8.4–10.5)
GFR calc Af Amer: >90 mL/min (ref >90)
GFR, EST NON AFRICAN AMERICAN: 80 mL/min — AB (ref >90)
Glucose, Bld: 121 mg/dL — ABNORMAL HIGH (ref 70–99)
POTASSIUM: 2.8 mmol/L — AB (ref 3.5–5.1)
Sodium: 138 mmol/L (ref 135–145)

## 2014-08-11 MED ORDER — TETANUS-DIPHTH-ACELL PERTUSSIS 5-2.5-18.5 LF-MCG/0.5 IM SUSP
0.5000 mL | Freq: Once | INTRAMUSCULAR | Status: AC
  Administered 2014-08-11: 0.5 mL via INTRAMUSCULAR
  Filled 2014-08-11: qty 0.5

## 2014-08-11 MED ORDER — SODIUM CHLORIDE 0.9 % IV SOLN
INTRAVENOUS | Status: DC
  Administered 2014-08-11 – 2014-08-15 (×4): via INTRAVENOUS

## 2014-08-11 MED ORDER — SODIUM CHLORIDE 0.9 % IV SOLN: INTRAVENOUS | Status: DC

## 2014-08-11 MED ORDER — POTASSIUM CHLORIDE 10 MEQ/100ML IV SOLN
10.0000 meq | Freq: Once | INTRAVENOUS | Status: AC
  Administered 2014-08-11: 10 meq via INTRAVENOUS
  Filled 2014-08-11: qty 100

## 2014-08-11 MED ORDER — LABETALOL HCL 5 MG/ML IV SOLN
10.0000 mg | INTRAVENOUS | Status: DC | PRN
  Administered 2014-08-11: 10 mg via INTRAVENOUS
  Filled 2014-08-11: qty 4

## 2014-08-11 MED ORDER — HYDROMORPHONE HCL 1 MG/ML IJ SOLN
0.5000 mg | Freq: Once | INTRAMUSCULAR | Status: AC
  Administered 2014-08-11: 0.5 mg via INTRAVENOUS
  Filled 2014-08-11: qty 1

## 2014-08-11 MED ORDER — POTASSIUM CHLORIDE 10 MEQ/100ML IV SOLN
10.0000 meq | INTRAVENOUS | Status: AC
  Administered 2014-08-11 – 2014-08-12 (×3): 10 meq via INTRAVENOUS
  Filled 2014-08-11 (×2): qty 100

## 2014-08-11 MED ORDER — ONDANSETRON HCL 4 MG/2ML IJ SOLN
4.0000 mg | Freq: Once | INTRAMUSCULAR | Status: AC
  Administered 2014-08-11: 4 mg via INTRAVENOUS
  Filled 2014-08-11: qty 2

## 2014-08-11 NOTE — ED Notes (Signed)
Dr. Deretha EmoryZackowski made aware of pt bp prior to transport, no additional meds at this time.

## 2014-08-11 NOTE — ED Notes (Signed)
Pt to room 6 in w/c, able to stand and pivot to bed with 2 staff assist. Pt reports fall outside while walking her dog. Pt denies syncope, states she tripped over the dog while walking it. Per neighbor, pt fell face first into gravel. Pr with superficial abrasions to face and hands, cc is bilateral knee pain. Pants removed to reveal abrasions to both knees. Pt yells out in pain with any movement of knees.

## 2014-08-11 NOTE — ED Notes (Signed)
Pt denies any loc, she is awake alert and oriented to her baseline, per daughters pt has some dementia.

## 2014-08-11 NOTE — ED Notes (Signed)
Patient transported to radiology

## 2014-08-11 NOTE — H&P (Signed)
[facility] History and Physical  Heather Floyd:[id number] DOB: 1932/07/22 DOA: 08/11/2014  Referring physician: ED physician PCP: Gretel Acre, MD  Specialists:   Chief Complaint:  Left knee pain and head injury after fall  HPI: Heather Floyd is a 81 y.o. female with a past medical history of hypertension, hyperlipidemia, RLS, TIA, carotid artery stenosis, dementia, who presents with Left knee pain and head injury after fall.  Patient was walking her dog and fell onto her face today. She injured her hands, knees, face and head. She has a bruise over her face, pain over left knee joint. Patient denies fever, chills, fatigue, headaches, cough, chest pain, SOB, abdominal pain, diarrhea, constipation, dysuria, urgency, frequency, hematuria, skin rashes, joint pain or leg swelling. No unilateral weakness, numbness or tingling sensations. No vision change or hearing loss.  in ED, patient was found to have small SDH of Rt Temporal lobe by CT scan-head, Left Patellar fx on Knee Xray, potassium level = 2.8. Patient is admitted to inpatient for further evaluation and treatment. Neurosurgery was consulted by ED.  Review of Systems: As presented in the history of presenting illness, rest negative.  Where does patient live?  At home Can patient participate in ADLs? little  Allergy:  Allergies  Allergen Reactions  . Aspirin Other (See Comments)    Makes blood too thin  . Codeine Nausea Only    Past Medical History  Diagnosis Date  . Hyperlipidemia   . Hypertension   . RLS (restless legs syndrome)   . Sleep disorder   . Personal history of colonic polyps   . Osteoporosis   . DJD (degenerative joint disease) of cervical spine   . Varicose veins   . TIA (transient ischemic attack)   . Carotid artery occlusion   . Memory disturbance 03/29/2013    Past Surgical History  Procedure Laterality Date  . Anterior and posterior vaginal repair w/ sacrospinous ligament suspension    .  Endovenous ablation saphenous vein w/ laser    . Breast reduction surgery    . Cesarean section    . Hemorrhoid surgery      Social History:  reports that she quit smoking about 34 years ago. She has never used smokeless tobacco. She reports that she does not drink alcohol or use illicit drugs.  Family History:  Family History  Problem Relation Age of Onset  . Mental illness Mother   . Alzheimer's disease Mother   . Cancer Father   . Bone cancer Father   . Dementia Sister      Prior to Admission medications   Medication Sig Start Date End Date Taking? Authorizing Provider  alendronate (FOSAMAX) 70 MG tablet Take 70 mg by mouth once a week. Take with a full glass of water on an empty stomach. On sundays    Historical Provider, MD  Cholecalciferol (VITAMIN D3) 2000 UNITS TABS Take 2,000 Units by mouth daily.    Historical Provider, MD  Cholecalciferol (VITAMIN D3) 400 UNITS tablet Take 400 Units by mouth daily.    Historical Provider, MD  cyanocobalamin 500 MCG tablet Take 500 mcg by mouth daily as needed (for energy).     Historical Provider, MD  fenofibrate micronized (LOFIBRA) 200 MG capsule Take 200 mg by mouth daily before breakfast.    Historical Provider, MD  lisinopril (PRINIVIL,ZESTRIL) 20 MG tablet Take 20 mg by mouth every morning.     Historical Provider, MD  Memantine HCl ER (NAMENDA XR) 28 MG CP24  Take 28 mg by mouth daily. 05/29/14   [name], MD  Misc Natural Products (GLUCOSAMINE-CHONDROITIN DS) TABS Take 1 tablet by mouth 2 (two) times a week.    Historical Provider, MD  Multiple Vitamin (MULTIVITAMIN WITH MINERALS) TABS tablet Take 1 tablet by mouth every morning.    Historical Provider, MD  simvastatin (ZOCOR) 80 MG tablet Take 80 mg by mouth every morning.     Historical Provider, MD  vitamin C (ASCORBIC ACID) 500 MG tablet Take 500 mg by mouth daily.    Historical Provider, MD    Physical Exam: Filed Vitals:   08/11/14 2300 08/12/14 0049 08/12/14 0300  08/12/14 0500  BP: 193/90 153/67 158/70   Pulse: 102 102 100   Temp: 99 F (37.2 C) 99 F (37.2 C) 98.6 F (37 C)   TempSrc: Oral Oral Oral   Resp: Height:      Weight:    54 kg (119 lb 0.8 oz)  SpO2: 97% 95% 95%    General: Not in acute distress HEENT: there are some small bruises over her face.       Eyes: PERRL, EOMI, no scleral icterus       ENT: No discharge from the ears and nose, no pharynx injection, no tonsillar enlargement.        Neck: No JVD, no bruit, no mass felt. Cardiac: S1/S2, RRR, No murmurs, No gallops or rubs Pulm: Good air movement bilaterally. Clear to auscultation bilaterally. No rales, wheezing, rhonchi or rubs. Abd: Soft, nondistended, nontender, no rebound pain, no organomegaly, BS present Ext: No edema bilaterally. 2+DP/PT pulse bilaterally Musculoskeletal: left knee pain, brace placed. Skin: No rashes.  Neuro: Alert and oriented X3, cranial nerves II-XII grossly intact, muscle strength 5/5 in all extremeties, sensation to light touch intact. Brachial reflex 1+ bilaterally. Knee reflex 1+ on the right. Negative Babinski's sign. Normal finger to nose test. Psych: Patient is not psychotic, no suicidal or hemocidal ideation.  Labs on Admission:  Basic Metabolic Panel:  Recent Labs Lab 08/11/14 1850 08/12/14 0155  NA 138 140  K 2.8* 3.2*  CL 102 104  CO2 29 28  GLUCOSE 121* 135*  BUN 10 8  CREATININE 0.70 0.73  CALCIUM 9.4 8.5   Liver Function Tests: No results for input(s): AST, ALT, ALKPHOS, BILITOT, PROT, ALBUMIN in the last 168 hours. No results for input(s): LIPASE, AMYLASE in the last 168 hours. No results for input(s): AMMONIA in the last 168 hours. CBC:  Recent Labs Lab 08/11/14 1850 08/12/14 0155  WBC 10.9* 9.2  NEUTROABS 8.4*  --   HGB 14.9 12.1  HCT 42.1 34.4*  MCV 86.8 86.9  PLT 274 241   Cardiac Enzymes: No results for input(s): CKTOTAL, CKMB, CKMBINDEX, TROPONINI in the last 168 hours.  BNP (last 3  results) No results for input(s): BNP in the last 8760 hours.  ProBNP (last 3 results) No results for input(s): PROBNP in the last 8760 hours.  CBG: No results for input(s): GLUCAP in the last 168 hours.  Radiological Exams on Admission: Ct Head Wo Contrast  08/11/2014   CLINICAL DATA:  Facial injury after falling and gravel while walking dog.  EXAM: CT HEAD WITHOUT CONTRAST  CT MAXILLOFACIAL WITHOUT CONTRAST  CT CERVICAL SPINE WITHOUT CONTRAST  TECHNIQUE: Multidetector CT imaging of the head, cervical spine, and maxillofacial structures were performed using the standard protocol without intravenous contrast. Multiplanar CT image reconstructions of the cervical spine and maxillofacial structures  were also generated.  COMPARISON:  CT scan of January 13, 2013.  FINDINGS: CT HEAD FINDINGS  Bony calvarium appears intact. Mild diffuse cortical atrophy is noted. Mild chronic ischemic white matter disease is noted. No mass effect or midline shift is noted. Ventricular size is within normal limits. There is no evidence of mass lesion or acute infarction. Small subdural hematoma is noted in right temporal region and middle cranial fossa with maximum thickness of 3 mm.  CT MAXILLOFACIAL FINDINGS  No fracture or other bony abnormality is noted. Pterygoid plates appear normal. Paranasal sinuses appear normal. Globes and orbits appear normal.  CT CERVICAL SPINE FINDINGS  No fracture or spondylolisthesis is noted. Moderate degenerative disc disease is noted at C5-6 and C6-7. Mild degenerative changes seen involving the posterior facet joints. Visualized portion of lung apices appear normal.  IMPRESSION: No significant abnormality seen in the maxillofacial region.  Moderate degenerative disc disease is noted at C5-6 and C6-7. No acute abnormality seen in the cervical spine.  Small right temporal subdural hematoma is noted without significant mass effect or midline shift. Critical Value/emergent results were called by  telephone at the time of interpretation on 08/11/2014 at 6:40 pm to Dr. Vanetta Mulders , who verbally acknowledged these results.   Electronically Signed   By: Lupita Raider, M.D.   On: 08/11/2014 18:41   Ct Cervical Spine Wo Contrast  08/11/2014   CLINICAL DATA:  Facial injury after falling and gravel while walking dog.  EXAM: CT HEAD WITHOUT CONTRAST  CT MAXILLOFACIAL WITHOUT CONTRAST  CT CERVICAL SPINE WITHOUT CONTRAST  TECHNIQUE: Multidetector CT imaging of the head, cervical spine, and maxillofacial structures were performed using the standard protocol without intravenous contrast. Multiplanar CT image reconstructions of the cervical spine and maxillofacial structures were also generated.  COMPARISON:  CT scan of January 13, 2013.  FINDINGS: CT HEAD FINDINGS  Bony calvarium appears intact. Mild diffuse cortical atrophy is noted. Mild chronic ischemic white matter disease is noted. No mass effect or midline shift is noted. Ventricular size is within normal limits. There is no evidence of mass lesion or acute infarction. Small subdural hematoma is noted in right temporal region and middle cranial fossa with maximum thickness of 3 mm.  CT MAXILLOFACIAL FINDINGS  No fracture or other bony abnormality is noted. Pterygoid plates appear normal. Paranasal sinuses appear normal. Globes and orbits appear normal.  CT CERVICAL SPINE FINDINGS  No fracture or spondylolisthesis is noted. Moderate degenerative disc disease is noted at C5-6 and C6-7. Mild degenerative changes seen involving the posterior facet joints. Visualized portion of lung apices appear normal.  IMPRESSION: No significant abnormality seen in the maxillofacial region.  Moderate degenerative disc disease is noted at C5-6 and C6-7. No acute abnormality seen in the cervical spine.  Small right temporal subdural hematoma is noted without significant mass effect or midline shift. Critical Value/emergent results were called by telephone at the time of  interpretation on 08/11/2014 at 6:40 pm to Dr. Vanetta Mulders , who verbally acknowledged these results.   Electronically Signed   By: Lupita Raider, M.D.   On: 08/11/2014 18:41   Dg Knee Complete 4 Views Left  08/11/2014   CLINICAL DATA:  Fall, left knee pain.  EXAM: LEFT KNEE - COMPLETE 4+ VIEW  COMPARISON:  MRI 09/17/2003.  FINDINGS: There is a fracture through the inferior pole of the left pigtail. Minimal displacement of the inferior fracture fragment. No additional fracture. Small joint effusion. Joint spaces are maintained.  IMPRESSION: Fracture through the inferior pole of the left patella.   Electronically Signed   By: Charlett Nose M.D.   On: 08/11/2014 18:55   Dg Knee Complete 4 Views Right  08/11/2014   CLINICAL DATA:  Fall onto left knee today with injury. Difficulty moving the knee.  EXAM: RIGHT KNEE - COMPLETE 4+ VIEW  COMPARISON:  None.  FINDINGS: Bony demineralization. Prepatellar soft tissue swelling. Indistinct patellar tendon without overt patella alta. Chronic appearing spurring along the quadriceps attachment site to the patella. No overt knee effusion.  IMPRESSION: 1. Prepatellar edema or bursitis. Indistinct patellar tendon, but without patella alta to suggest rupture of the patellar tendon. Correlate with any patellar laxity at physical exam. 2. Bony demineralization.   Electronically Signed   By: Gaylyn Rong M.D.   On: 08/11/2014 18:56   Dg Hips Bilat With Pelvis 2v  08/11/2014   CLINICAL DATA:  Larey Seat outside today. Left knee pain. Difficulty moving.  EXAM: BILATERAL HIP (WITH PELVIS) 2 VIEWS  COMPARISON:  Pelvis 03/19/2012  FINDINGS: Pelvis and both hips appear intact. No acute fracture or dislocation identified. No focal bone lesion or bone destruction. SI joints and symphysis pubis are not displaced. Vascular calcifications are present. Degenerative changes in the lower lumbar spine.  IMPRESSION: No acute fracture or dislocation demonstrated in the pelvis or hips.    Electronically Signed   By: Burman Nieves M.D.   On: 08/11/2014 18:58   Ct Maxillofacial Wo Cm  08/11/2014   CLINICAL DATA:  Facial injury after falling and gravel while walking dog.  EXAM: CT HEAD WITHOUT CONTRAST  CT MAXILLOFACIAL WITHOUT CONTRAST  CT CERVICAL SPINE WITHOUT CONTRAST  TECHNIQUE: Multidetector CT imaging of the head, cervical spine, and maxillofacial structures were performed using the standard protocol without intravenous contrast. Multiplanar CT image reconstructions of the cervical spine and maxillofacial structures were also generated.  COMPARISON:  CT scan of January 13, 2013.  FINDINGS: CT HEAD FINDINGS  Bony calvarium appears intact. Mild diffuse cortical atrophy is noted. Mild chronic ischemic white matter disease is noted. No mass effect or midline shift is noted. Ventricular size is within normal limits. There is no evidence of mass lesion or acute infarction. Small subdural hematoma is noted in right temporal region and middle cranial fossa with maximum thickness of 3 mm.  CT MAXILLOFACIAL FINDINGS  No fracture or other bony abnormality is noted. Pterygoid plates appear normal. Paranasal sinuses appear normal. Globes and orbits appear normal.  CT CERVICAL SPINE FINDINGS  No fracture or spondylolisthesis is noted. Moderate degenerative disc disease is noted at C5-6 and C6-7. Mild degenerative changes seen involving the posterior facet joints. Visualized portion of lung apices appear normal.  IMPRESSION: No significant abnormality seen in the maxillofacial region.  Moderate degenerative disc disease is noted at C5-6 and C6-7. No acute abnormality seen in the cervical spine.  Small right temporal subdural hematoma is noted without significant mass effect or midline shift. Critical Value/emergent results were called by telephone at the time of interpretation on 08/11/2014 at 6:40 pm to Dr. Vanetta Mulders , who verbally acknowledged these results.   Electronically Signed   By: Lupita Raider, M.D.   On: 08/11/2014 18:41    EKG: Independently reviewed.   Assessment/Plan Principal Problem:   Subdural hematoma Active Problems:   Hyperlipidemia   Hypertension   RLS (restless legs syndrome)   DJD (degenerative joint disease) of cervical spine   TIA (transient ischemic attack)   Carotid artery  occlusion   Hypokalemia   Patellar fracture   SDH (subdural hematoma)   Essential hypertension  Small subdural hematoma: No midline shift. Her mental status is at baseline. No focal neurologic findings on examination. Neurosurgery was consulted, no surgery needed at this moment per Dr. Venetia MaxonStern.  -admit to tele bed -Neuro check every 4 hours -may repeat CT head in 48 hours or develop neurologic symptoms  Left patella fracture: Knee brace is placed. Orthopedic surgeon was consulted by ED. -will follow up those recommendation -Pain control  Hyperkalemia: Potassium 2.8 -repleted with total of 80 mEq of potassium chloride -repeat BMP and check Mg level  Hyperlipidemia: -Continue Lipitor, fenofibrate  Dementia: No behavior issues -Memantine  Hypertension:  -continue labetalol, lisinopril,   DVT ppx: SCD  Code Status: Full code Family Communication: None at bed side.       Disposition Plan: Admit to inpatient   Date of Service 08/12/2014    Lorretta HarpIU, Floris Neuhaus [facility] Pager [phone]  If 7PM-7AM, please contact night-coverage [email or web address] Password [email or web address] 08/12/2014, 7:12 AM

## 2014-08-11 NOTE — ED Provider Notes (Addendum)
CSN: [id number]     Arrival date & time 08/11/14  1705 History   First MD Initiated Contact with Patient 08/11/14 1706     Chief Complaint  Patient presents with  . Fall     (Consider location/radiation/quality/duration/timing/severity/associated sxs/prior Treatment) Patient is a 81 y.o. female presenting with fall. The history is provided by the patient and a relative. The history is limited by the condition of the patient.  Fall  patient with fall at home while walking her dogs. Was assisted by neighbors. Apparently no loss of consciousness. Patient did not have a syncopal episode. Patient with complaining of left knee pain abrasions to both knees both hands and face. No nosebleed. No nausea no vomiting.   Level V caveat applies to the history due to her history tree of memory deficit.  Patient's record Dr. Is Deboraha Sprang physicians.  Past Medical History  Diagnosis Date  . Hyperlipidemia   . Hypertension   . RLS (restless legs syndrome)   . Sleep disorder   . Personal history of colonic polyps   . Osteoporosis   . DJD (degenerative joint disease) of cervical spine   . Varicose veins   . TIA (transient ischemic attack)   . Carotid artery occlusion   . Memory disturbance 03/29/2013   Past Surgical History  Procedure Laterality Date  . Anterior and posterior vaginal repair w/ sacrospinous ligament suspension    . Endovenous ablation saphenous vein w/ laser    . Breast reduction surgery    . Cesarean section    . Hemorrhoid surgery     Family History  Problem Relation Age of Onset  . Mental illness Mother   . Alzheimer's disease Mother   . Cancer Father   . Bone cancer Father   . Dementia Sister    History  Substance Use Topics  . Smoking status: Former Smoker    Quit date: 06/16/1980  . Smokeless tobacco: Never Used  . Alcohol Use: No   OB History    No data available     Review of Systems  Unable to perform ROS level V caveat applies the review of systems due  to her dementia. Patient is alert but has memory significant memory deficits.    Allergies  Aspirin and Codeine  Home Medications   Prior to Admission medications   Medication Sig Start Date End Date Taking? Authorizing Provider  alendronate (FOSAMAX) 70 MG tablet Take 70 mg by mouth once a week. Take with a full glass of water on an empty stomach. On sundays    Historical Provider, MD  Cholecalciferol (VITAMIN D3) 2000 UNITS TABS Take 2,000 Units by mouth daily.    Historical Provider, MD  Cholecalciferol (VITAMIN D3) 400 UNITS tablet Take 400 Units by mouth daily.    Historical Provider, MD  cyanocobalamin 500 MCG tablet Take 500 mcg by mouth daily as needed (for energy).     Historical Provider, MD  fenofibrate micronized (LOFIBRA) 200 MG capsule Take 200 mg by mouth daily before breakfast.    Historical Provider, MD  lisinopril (PRINIVIL,ZESTRIL) 20 MG tablet Take 20 mg by mouth every morning.     Historical Provider, MD  Memantine HCl ER (NAMENDA XR) 28 MG CP24 Take 28 mg by mouth daily. 05/29/14   [name], MD  Misc Natural Products (GLUCOSAMINE-CHONDROITIN DS) TABS Take 1 tablet by mouth 2 (two) times a week.    Historical Provider, MD  Multiple Vitamin (MULTIVITAMIN WITH MINERALS) TABS tablet Take 1 tablet  by mouth every morning.    Historical Provider, MD  simvastatin (ZOCOR) 80 MG tablet Take 80 mg by mouth every morning.     Historical Provider, MD  vitamin C (ASCORBIC ACID) 500 MG tablet Take 500 mg by mouth daily.    Historical Provider, MD   BP 215/86 mmHg  Pulse 87  Temp(Src) 98 F (36.7 C) (Oral)  Resp 13  SpO2 100% Physical Exam  Constitutional: She is oriented to person, place, and time. She appears well-developed and well-nourished. No distress.  HENT:  Head: Normocephalic.  Mouth/Throat: Oropharynx is clear and moist.  Patient with abrasions to the face. No nosebleed. No septal hematoma.  Eyes: Conjunctivae and EOM are normal. Pupils are equal, round,  and reactive to light.  Neck: Normal range of motion. Neck supple.  Cardiovascular: Normal rate, regular rhythm and normal heart sounds.   Pulmonary/Chest: Effort normal and breath sounds normal. No respiratory distress.  Abdominal: Soft. Bowel sounds are normal. There is no tenderness.  Musculoskeletal: Normal range of motion.  Abrasions to both knees. Some mild superficial abrasions to the hands.  Neurological: She is alert and oriented to person, place, and time. No cranial nerve deficit. She exhibits normal muscle tone. Coordination normal.  Skin: Skin is warm. Rash noted.  Nursing note and vitals reviewed.   ED Course  Procedures (including critical care time) Labs Review Labs Reviewed  CBC WITH DIFFERENTIAL/PLATELET - Abnormal; Notable for the following:    WBC 10.9 (*)    Neutro Abs 8.4 (*)    All other components within normal limits  BASIC METABOLIC PANEL - Abnormal; Notable for the following:    Potassium 2.8 (*)    Glucose, Bld 121 (*)    GFR calc non Af Amer 80 (*)    All other components within normal limits   Results for orders placed or performed during the hospital encounter of 08/11/14  CBC with Differential/Platelet  Result Value Ref Range   WBC 10.9 (H) 4.0 - 10.5 K/uL   RBC 4.85 3.87 - 5.11 MIL/uL   Hemoglobin 14.9 12.0 - 15.0 g/dL   HCT 16.1 09.6 - 04.5 %   MCV 86.8 78.0 - 100.0 fL   MCH 30.7 26.0 - 34.0 pg   MCHC 35.4 30.0 - 36.0 g/dL   RDW 40.9 81.1 - 91.4 %   Platelets 274 150 - 400 K/uL   Neutrophils Relative % 77 43 - 77 %   Neutro Abs 8.4 (H) 1.7 - 7.7 K/uL   Lymphocytes Relative 14 12 - 46 %   Lymphs Abs 1.5 0.7 - 4.0 K/uL   Monocytes Relative 7 3 - 12 %   Monocytes Absolute 0.8 0.1 - 1.0 K/uL   Eosinophils Relative 1 0 - 5 %   Eosinophils Absolute 0.1 0.0 - 0.7 K/uL   Basophils Relative 1 0 - 1 %   Basophils Absolute 0.1 0.0 - 0.1 K/uL  Basic metabolic panel  Result Value Ref Range   Sodium 138 135 - 145 mmol/L   Potassium 2.8 (L) 3.5 -  5.1 mmol/L   Chloride 102 96 - 112 mmol/L   CO2 29 19 - 32 mmol/L   Glucose, Bld 121 (H) 70 - 99 mg/dL   BUN 10 6 - 23 mg/dL   Creatinine, Ser 7.82 0.50 - 1.10 mg/dL   Calcium 9.4 8.4 - 95.6 mg/dL   GFR calc non Af Amer 80 (L) >90 mL/min   GFR calc Af Amer >90 >90 mL/min  Anion gap 7 5 - 15     Imaging Review Ct Head Wo Contrast  08/11/2014   CLINICAL DATA:  Facial injury after falling and gravel while walking dog.  EXAM: CT HEAD WITHOUT CONTRAST  CT MAXILLOFACIAL WITHOUT CONTRAST  CT CERVICAL SPINE WITHOUT CONTRAST  TECHNIQUE: Multidetector CT imaging of the head, cervical spine, and maxillofacial structures were performed using the standard protocol without intravenous contrast. Multiplanar CT image reconstructions of the cervical spine and maxillofacial structures were also generated.  COMPARISON:  CT scan of January 13, 2013.  FINDINGS: CT HEAD FINDINGS  Bony calvarium appears intact. Mild diffuse cortical atrophy is noted. Mild chronic ischemic white matter disease is noted. No mass effect or midline shift is noted. Ventricular size is within normal limits. There is no evidence of mass lesion or acute infarction. Small subdural hematoma is noted in right temporal region and middle cranial fossa with maximum thickness of 3 mm.  CT MAXILLOFACIAL FINDINGS  No fracture or other bony abnormality is noted. Pterygoid plates appear normal. Paranasal sinuses appear normal. Globes and orbits appear normal.  CT CERVICAL SPINE FINDINGS  No fracture or spondylolisthesis is noted. Moderate degenerative disc disease is noted at C5-6 and C6-7. Mild degenerative changes seen involving the posterior facet joints. Visualized portion of lung apices appear normal.  IMPRESSION: No significant abnormality seen in the maxillofacial region.  Moderate degenerative disc disease is noted at C5-6 and C6-7. No acute abnormality seen in the cervical spine.  Small right temporal subdural hematoma is noted without significant mass  effect or midline shift. Critical Value/emergent results were called by telephone at the time of interpretation on 08/11/2014 at 6:40 pm to Dr. Vanetta Mulders , who verbally acknowledged these results.   Electronically Signed   By: Lupita Raider, M.D.   On: 08/11/2014 18:41   Ct Cervical Spine Wo Contrast  08/11/2014   CLINICAL DATA:  Facial injury after falling and gravel while walking dog.  EXAM: CT HEAD WITHOUT CONTRAST  CT MAXILLOFACIAL WITHOUT CONTRAST  CT CERVICAL SPINE WITHOUT CONTRAST  TECHNIQUE: Multidetector CT imaging of the head, cervical spine, and maxillofacial structures were performed using the standard protocol without intravenous contrast. Multiplanar CT image reconstructions of the cervical spine and maxillofacial structures were also generated.  COMPARISON:  CT scan of January 13, 2013.  FINDINGS: CT HEAD FINDINGS  Bony calvarium appears intact. Mild diffuse cortical atrophy is noted. Mild chronic ischemic white matter disease is noted. No mass effect or midline shift is noted. Ventricular size is within normal limits. There is no evidence of mass lesion or acute infarction. Small subdural hematoma is noted in right temporal region and middle cranial fossa with maximum thickness of 3 mm.  CT MAXILLOFACIAL FINDINGS  No fracture or other bony abnormality is noted. Pterygoid plates appear normal. Paranasal sinuses appear normal. Globes and orbits appear normal.  CT CERVICAL SPINE FINDINGS  No fracture or spondylolisthesis is noted. Moderate degenerative disc disease is noted at C5-6 and C6-7. Mild degenerative changes seen involving the posterior facet joints. Visualized portion of lung apices appear normal.  IMPRESSION: No significant abnormality seen in the maxillofacial region.  Moderate degenerative disc disease is noted at C5-6 and C6-7. No acute abnormality seen in the cervical spine.  Small right temporal subdural hematoma is noted without significant mass effect or midline shift. Critical  Value/emergent results were called by telephone at the time of interpretation on 08/11/2014 at 6:40 pm to Dr. Vanetta Mulders , who verbally acknowledged  these results.   Electronically Signed   By: Lupita Raider, M.D.   On: 08/11/2014 18:41   Dg Knee Complete 4 Views Left  08/11/2014   CLINICAL DATA:  Fall, left knee pain.  EXAM: LEFT KNEE - COMPLETE 4+ VIEW  COMPARISON:  MRI 09/17/2003.  FINDINGS: There is a fracture through the inferior pole of the left pigtail. Minimal displacement of the inferior fracture fragment. No additional fracture. Small joint effusion. Joint spaces are maintained.  IMPRESSION: Fracture through the inferior pole of the left patella.   Electronically Signed   By: Charlett Nose M.D.   On: 08/11/2014 18:55   Dg Knee Complete 4 Views Right  08/11/2014   CLINICAL DATA:  Fall onto left knee today with injury. Difficulty moving the knee.  EXAM: RIGHT KNEE - COMPLETE 4+ VIEW  COMPARISON:  None.  FINDINGS: Bony demineralization. Prepatellar soft tissue swelling. Indistinct patellar tendon without overt patella alta. Chronic appearing spurring along the quadriceps attachment site to the patella. No overt knee effusion.  IMPRESSION: 1. Prepatellar edema or bursitis. Indistinct patellar tendon, but without patella alta to suggest rupture of the patellar tendon. Correlate with any patellar laxity at physical exam. 2. Bony demineralization.   Electronically Signed   By: Gaylyn Rong M.D.   On: 08/11/2014 18:56   Dg Hips Bilat With Pelvis 2v  08/11/2014   CLINICAL DATA:  Larey Seat outside today. Left knee pain. Difficulty moving.  EXAM: BILATERAL HIP (WITH PELVIS) 2 VIEWS  COMPARISON:  Pelvis 03/19/2012  FINDINGS: Pelvis and both hips appear intact. No acute fracture or dislocation identified. No focal bone lesion or bone destruction. SI joints and symphysis pubis are not displaced. Vascular calcifications are present. Degenerative changes in the lower lumbar spine.  IMPRESSION: No acute  fracture or dislocation demonstrated in the pelvis or hips.   Electronically Signed   By: Burman Nieves M.D.   On: 08/11/2014 18:58   Ct Maxillofacial Wo Cm  08/11/2014   CLINICAL DATA:  Facial injury after falling and gravel while walking dog.  EXAM: CT HEAD WITHOUT CONTRAST  CT MAXILLOFACIAL WITHOUT CONTRAST  CT CERVICAL SPINE WITHOUT CONTRAST  TECHNIQUE: Multidetector CT imaging of the head, cervical spine, and maxillofacial structures were performed using the standard protocol without intravenous contrast. Multiplanar CT image reconstructions of the cervical spine and maxillofacial structures were also generated.  COMPARISON:  CT scan of January 13, 2013.  FINDINGS: CT HEAD FINDINGS  Bony calvarium appears intact. Mild diffuse cortical atrophy is noted. Mild chronic ischemic white matter disease is noted. No mass effect or midline shift is noted. Ventricular size is within normal limits. There is no evidence of mass lesion or acute infarction. Small subdural hematoma is noted in right temporal region and middle cranial fossa with maximum thickness of 3 mm.  CT MAXILLOFACIAL FINDINGS  No fracture or other bony abnormality is noted. Pterygoid plates appear normal. Paranasal sinuses appear normal. Globes and orbits appear normal.  CT CERVICAL SPINE FINDINGS  No fracture or spondylolisthesis is noted. Moderate degenerative disc disease is noted at C5-6 and C6-7. Mild degenerative changes seen involving the posterior facet joints. Visualized portion of lung apices appear normal.  IMPRESSION: No significant abnormality seen in the maxillofacial region.  Moderate degenerative disc disease is noted at C5-6 and C6-7. No acute abnormality seen in the cervical spine.  Small right temporal subdural hematoma is noted without significant mass effect or midline shift. Critical Value/emergent results were called by telephone at the time  of interpretation on 08/11/2014 at 6:40 pm to Dr. Vanetta MuldersSCOTT Kimberley Speece , who verbally  acknowledged these results.   Electronically Signed   By: Lupita RaiderJames  Green Jr, M.D.   On: 08/11/2014 18:41     EKG Interpretation   Date/Time:  Friday August 11 2014 17:19:23 EST Ventricular Rate:  85 PR Interval:  126 QRS Duration: 74 QT Interval:  420 QTC Calculation: 499 R Axis:   -9 Text Interpretation:  Normal sinus rhythm Minimal voltage criteria for  LVH, may be normal variant Nonspecific ST and T wave abnormality Prolonged  QT Abnormal ECG No significant change since last tracing Confirmed by  Shaquetta Arcos  MD, Hart Haas [phone]([id number]) on 08/11/2014 5:29:40 PM       CRITICAL CARE Performed by: Vanetta MuldersZACKOWSKI,Johnnisha Forton Total critical care time: 30 Critical care time was exclusive of separately billable procedures and treating other patients. Critical care was necessary to treat or prevent imminent or life-threatening deterioration. Critical care was time spent personally by me on the following activities: development of treatment plan with patient and/or surrogate as well as nursing, discussions with consultants, evaluation of patient's response to treatment, examination of patient, obtaining history from patient or surrogate, ordering and performing treatments and interventions, ordering and review of laboratory studies, ordering and review of radiographic studies, pulse oximetry and re-evaluation of patient's condition.    MDM   Final diagnoses:  Fall  Subdural hematoma  Essential hypertension  Patellar fracture, left, closed, initial encounter  Hypokalemia  Abrasions of multiple sites    Patient status post fall while walking her dog. No syncope no loss of consciousness. Workup reveals several problems. Patient has a history of hypertension but is not taking her hypertensive meds. So blood pressure is significantly elevated diastolics of 89-99 and systolics to 18-236. Head CT reveals a small subdural hematoma. Facial CT negative and cervical CT is negative. Patient had swelling to the left  knee reveals an inferior patellar fracture without displacement. This is been placed in a knee immobilizer. And will need orthopedic consult. Patient also with abrasions to the face both hands and both knees. Tetanus updated here patient was not sure last time she had tetanus shot.  For the subdural hematoma discussed with Dr. Lezlie LyeStearns neurosurgery he recommends admission and blood pressure control. Discussed with consult and neuro hospitalists and he concurred that the IV labetalol would be appropriate first line of treatment. This has been started to help control the blood pressure. Patient's potassium was also below 3 patient given some IV potassium 10 mEq. Patient's EKG without acute changes. X-rays of both hips without evidence of any hip injury. X-ray of the right knee shows no acute findings. Clinically no significant injury there. Patient is not on any blood thinners. Patient's platelet count is normal. No significant anemia. [facility] hospitalist service Dr. Lovell SheehanJenkins excepting will be transferred via [facility].    Vanetta MuldersScott Ranon Coven, MD 08/11/14 2025  Vanetta MuldersScott Emerald Gehres, MD 08/23/14 1150

## 2014-08-11 NOTE — ED Notes (Signed)
edp with pt and family to discuss disposition at this time

## 2014-08-11 NOTE — Progress Notes (Signed)
Pt admitted to room 4N04 from [facility] Patient  Is alert  And oriented X2  Made comfortable oriented to room.

## 2014-08-11 NOTE — ED Notes (Signed)
MD at bedside. 

## 2014-08-12 ENCOUNTER — Inpatient Hospital Stay (HOSPITAL_COMMUNITY): Payer: Medicare Other

## 2014-08-12 ENCOUNTER — Encounter (HOSPITAL_COMMUNITY): Payer: Self-pay | Admitting: *Deleted

## 2014-08-12 DIAGNOSIS — S82002S Unspecified fracture of left patella, sequela: Secondary | ICD-10-CM

## 2014-08-12 LAB — MAGNESIUM: Magnesium: 1.7 mg/dL (ref 1.5–2.5)

## 2014-08-12 LAB — BASIC METABOLIC PANEL
Anion gap: 8 (ref 5–15)
BUN: 8 mg/dL (ref 6–23)
CALCIUM: 8.5 mg/dL (ref 8.4–10.5)
CHLORIDE: 104 mmol/L (ref 96–112)
CO2: 28 mmol/L (ref 19–32)
Creatinine, Ser: 0.73 mg/dL (ref 0.50–1.10)
GFR calc Af Amer: >90 mL/min (ref >90)
GFR calc non Af Amer: 79 mL/min — ABNORMAL LOW (ref >90)
GLUCOSE: 135 mg/dL — AB (ref 70–99)
Potassium: 3.2 mmol/L — ABNORMAL LOW (ref 3.5–5.1)
SODIUM: 140 mmol/L (ref 135–145)

## 2014-08-12 LAB — CBC
HCT: 34.4 % — ABNORMAL LOW (ref 36.0–46.0)
Hemoglobin: 12.1 g/dL (ref 12.0–15.0)
MCH: 30.6 pg (ref 26.0–34.0)
MCHC: 35.2 g/dL (ref 30.0–36.0)
MCV: 86.9 fL (ref 78.0–100.0)
PLATELETS: 241 10*3/uL (ref 150–400)
RBC: 3.96 MIL/uL (ref 3.87–5.11)
RDW: 13 % (ref 11.5–15.5)
WBC: 9.2 10*3/uL (ref 4.0–10.5)

## 2014-08-12 LAB — APTT: APTT: 27 s (ref 24–37)

## 2014-08-12 LAB — PROTIME-INR
INR: 1.12 (ref 0.00–1.49)
PROTHROMBIN TIME: 14.6 s (ref 11.6–15.2)

## 2014-08-12 MED ORDER — OXYCODONE-ACETAMINOPHEN 5-325 MG PO TABS
1.0000 | ORAL_TABLET | ORAL | Status: DC | PRN
  Administered 2014-08-12 – 2014-08-15 (×10): 1 via ORAL
  Filled 2014-08-12 (×10): qty 1

## 2014-08-12 MED ORDER — METOPROLOL TARTRATE 25 MG PO TABS
25.0000 mg | ORAL_TABLET | Freq: Two times a day (BID) | ORAL | Status: DC
Start: 2014-08-12 — End: 2014-08-14
  Administered 2014-08-12 – 2014-08-14 (×5): 25 mg via ORAL
  Filled 2014-08-12 (×5): qty 1

## 2014-08-12 MED ORDER — VITAMIN D 1000 UNITS PO TABS
2000.0000 [IU] | ORAL_TABLET | Freq: Every day | ORAL | Status: DC
  Administered 2014-08-12 – 2014-08-15 (×4): 2000 [IU] via ORAL
  Filled 2014-08-12 (×4): qty 2

## 2014-08-12 MED ORDER — ADULT MULTIVITAMIN W/MINERALS CH
1.0000 | ORAL_TABLET | Freq: Every morning | ORAL | Status: DC
  Administered 2014-08-12 – 2014-08-15 (×4): 1 via ORAL
  Filled 2014-08-12 (×5): qty 1

## 2014-08-12 MED ORDER — ATORVASTATIN CALCIUM 40 MG PO TABS
40.0000 mg | ORAL_TABLET | Freq: Every day | ORAL | Status: DC
  Administered 2014-08-12 – 2014-08-14 (×3): 40 mg via ORAL
  Filled 2014-08-12: qty 1
  Filled 2014-08-12: qty 4
  Filled 2014-08-12: qty 1

## 2014-08-12 MED ORDER — VITAMIN B-12 1000 MCG PO TABS: 500.0000 ug | ORAL_TABLET | Freq: Every day | ORAL | Status: DC | PRN

## 2014-08-12 MED ORDER — VITAMIN C 500 MG PO TABS
500.0000 mg | ORAL_TABLET | Freq: Every day | ORAL | Status: DC
Start: 2014-08-12 — End: 2014-08-15
  Administered 2014-08-12 – 2014-08-15 (×4): 500 mg via ORAL
  Filled 2014-08-12 (×4): qty 1

## 2014-08-12 MED ORDER — MEMANTINE HCL ER 28 MG PO CP24
28.0000 mg | ORAL_CAPSULE | Freq: Every day | ORAL | Status: DC
  Administered 2014-08-12 – 2014-08-15 (×4): 28 mg via ORAL
  Filled 2014-08-12 (×4): qty 1

## 2014-08-12 MED ORDER — WHITE PETROLATUM GEL
Status: AC
  Administered 2014-08-12: 0.2
  Filled 2014-08-12: qty 1

## 2014-08-12 MED ORDER — ALPRAZOLAM 0.5 MG PO TABS
0.5000 mg | ORAL_TABLET | Freq: Three times a day (TID) | ORAL | Status: DC | PRN
  Administered 2014-08-12 – 2014-08-13 (×2): 0.5 mg via ORAL
  Filled 2014-08-12 (×2): qty 1

## 2014-08-12 MED ORDER — POTASSIUM CHLORIDE 20 MEQ/15ML (10%) PO SOLN
40.0000 meq | Freq: Two times a day (BID) | ORAL | Status: AC
  Administered 2014-08-12 (×2): 40 meq via ORAL
  Filled 2014-08-12 (×3): qty 30

## 2014-08-12 MED ORDER — POTASSIUM CHLORIDE 20 MEQ/15ML (10%) PO SOLN
40.0000 meq | Freq: Once | ORAL | Status: AC
  Administered 2014-08-12: 40 meq via ORAL
  Filled 2014-08-12: qty 30

## 2014-08-12 MED ORDER — LISINOPRIL 20 MG PO TABS
20.0000 mg | ORAL_TABLET | Freq: Every morning | ORAL | Status: DC
  Administered 2014-08-12 – 2014-08-15 (×4): 20 mg via ORAL
  Filled 2014-08-12 (×4): qty 1

## 2014-08-12 MED ORDER — FENOFIBRATE 160 MG PO TABS
160.0000 mg | ORAL_TABLET | Freq: Every day | ORAL | Status: DC
  Administered 2014-08-12 – 2014-08-15 (×4): 160 mg via ORAL
  Filled 2014-08-12 (×4): qty 1

## 2014-08-12 MED ORDER — SODIUM CHLORIDE 0.9 % IJ SOLN
3.0000 mL | Freq: Two times a day (BID) | INTRAMUSCULAR | Status: DC
  Administered 2014-08-11 – 2014-08-14 (×3): 3 mL via INTRAVENOUS

## 2014-08-12 MED ORDER — HYDROMORPHONE HCL 1 MG/ML IJ SOLN
0.5000 mg | INTRAMUSCULAR | Status: DC | PRN
  Administered 2014-08-12 – 2014-08-14 (×3): 0.5 mg via INTRAVENOUS
  Filled 2014-08-12 (×3): qty 1

## 2014-08-12 NOTE — Progress Notes (Signed)
PT Cancellation Note  Patient Details Name: Heather Floyd MRN: [id number] DOB: 01/27/1933   Cancelled Treatment:    Reason Eval/Treat Not Completed: Patient not medically ready. PT eval received, chart reviewed. Pt is currently on bed rest and is awaiting ortho consult for inferior patellar fracture. Will hold therapy until ortho consult completed.    Heather SlipperKirkman, Mali Eppard 08/12/2014, 7:14 AM   Heather Floyd, PT, DPT Acute Rehabilitation Services Pager: [phone]

## 2014-08-12 NOTE — Discharge Instructions (Signed)
Try to keep your left knee straight/fully extended at all times. You may put full weight on your left le in your knee immobilizer. You need to sleep wearing your knee immobilizer. You can occasionally rest out of the knee brace, but take good care not to bend your left knee.

## 2014-08-12 NOTE — Progress Notes (Signed)
Patient agitated, tearful and wants to go home. Writer tried to reassure patient. MD  Notified to see if patient can get something for anxiety.

## 2014-08-12 NOTE — Consult Note (Signed)
Reason for Consult:  Left knee patella fracture Referring Physician:   [facility]  Heather Floyd is an 81 y.o. female.  HPI:   82 yo female who sustained a mechanical fall yesterday.  Was admitted due to a subdural hematoma.  From an orthopedic standpoint, she has a left knee patella fracture.  She does report left knee pain.  Past Medical History  Diagnosis Date  . Hyperlipidemia   . Hypertension   . RLS (restless legs syndrome)   . Sleep disorder   . Personal history of colonic polyps   . Osteoporosis   . DJD (degenerative joint disease) of cervical spine   . Varicose veins   . TIA (transient ischemic attack)   . Carotid artery occlusion   . Memory disturbance 03/29/2013    Past Surgical History  Procedure Laterality Date  . Anterior and posterior vaginal repair w/ sacrospinous ligament suspension    . Endovenous ablation saphenous vein w/ laser    . Breast reduction surgery    . Cesarean section    . Hemorrhoid surgery      Family History  Problem Relation Age of Onset  . Mental illness Mother   . Alzheimer's disease Mother   . Cancer Father   . Bone cancer Father   . Dementia Sister     Social History:  reports that she quit smoking about 34 years ago. She has never used smokeless tobacco. She reports that she does not drink alcohol or use illicit drugs.  Allergies:  Allergies  Allergen Reactions  . Aspirin Other (See Comments)    Makes blood too thin  . Codeine Nausea Only    Medications: I have reviewed the patient's current medications.  Results for orders placed or performed during the hospital encounter of 08/11/14 (from the past 48 hour(s))  CBC with Differential/Platelet     Status: Abnormal   Collection Time: 08/11/14  6:50 PM  Result Value Ref Range   WBC 10.9 (H) 4.0 - 10.5 K/uL   RBC 4.85 3.87 - 5.11 MIL/uL   Hemoglobin 14.9 12.0 - 15.0 g/dL   HCT 42.1 36.0 - 46.0 %   MCV 86.8 78.0 - 100.0 fL   MCH 30.7 26.0 - 34.0 pg   MCHC 35.4  30.0 - 36.0 g/dL   RDW 12.9 11.5 - 15.5 %   Platelets 274 150 - 400 K/uL   Neutrophils Relative % 77 43 - 77 %   Neutro Abs 8.4 (H) 1.7 - 7.7 K/uL   Lymphocytes Relative 14 12 - 46 %   Lymphs Abs 1.5 0.7 - 4.0 K/uL   Monocytes Relative 7 3 - 12 %   Monocytes Absolute 0.8 0.1 - 1.0 K/uL   Eosinophils Relative 1 0 - 5 %   Eosinophils Absolute 0.1 0.0 - 0.7 K/uL   Basophils Relative 1 0 - 1 %   Basophils Absolute 0.1 0.0 - 0.1 K/uL  Basic metabolic panel     Status: Abnormal   Collection Time: 08/11/14  6:50 PM  Result Value Ref Range   Sodium 138 135 - 145 mmol/L   Potassium 2.8 (L) 3.5 - 5.1 mmol/L   Chloride 102 96 - 112 mmol/L   CO2 29 19 - 32 mmol/L   Glucose, Bld 121 (H) 70 - 99 mg/dL   BUN 10 6 - 23 mg/dL   Creatinine, Ser 0.70 0.50 - 1.10 mg/dL   Calcium 9.4 8.4 - 10.5 mg/dL   GFR calc non Af Wyvonnia Lora  80 (L) >90 mL/min   GFR calc Af Amer >90 >90 mL/min    Comment: (NOTE) The eGFR has been calculated using the CKD EPI equation. This calculation has not been validated in all clinical situations. eGFR's persistently <90 mL/min signify possible Chronic Kidney Disease.    Anion gap 7 5 - 15  Protime-INR     Status: None   Collection Time: 08/12/14  1:55 AM  Result Value Ref Range   Prothrombin Time 14.6 11.6 - 15.2 seconds   INR 1.12 0.00 - 1.49  APTT     Status: None   Collection Time: 08/12/14  1:55 AM  Result Value Ref Range   aPTT 27 24 - 37 seconds  Basic metabolic panel     Status: Abnormal   Collection Time: 08/12/14  1:55 AM  Result Value Ref Range   Sodium 140 135 - 145 mmol/L   Potassium 3.2 (L) 3.5 - 5.1 mmol/L   Chloride 104 96 - 112 mmol/L   CO2 28 19 - 32 mmol/L   Glucose, Bld 135 (H) 70 - 99 mg/dL   BUN 8 6 - 23 mg/dL   Creatinine, Ser 0.73 0.50 - 1.10 mg/dL   Calcium 8.5 8.4 - 10.5 mg/dL   GFR calc non Af Amer 79 (L) >90 mL/min   GFR calc Af Amer >90 >90 mL/min    Comment: (NOTE) The eGFR has been calculated using the CKD EPI equation. This  calculation has not been validated in all clinical situations. eGFR's persistently <90 mL/min signify possible Chronic Kidney Disease.    Anion gap 8 5 - 15  CBC     Status: Abnormal   Collection Time: 08/12/14  1:55 AM  Result Value Ref Range   WBC 9.2 4.0 - 10.5 K/uL   RBC 3.96 3.87 - 5.11 MIL/uL   Hemoglobin 12.1 12.0 - 15.0 g/dL   HCT 34.4 (L) 36.0 - 46.0 %   MCV 86.9 78.0 - 100.0 fL   MCH 30.6 26.0 - 34.0 pg   MCHC 35.2 30.0 - 36.0 g/dL   RDW 13.0 11.5 - 15.5 %   Platelets 241 150 - 400 K/uL  Magnesium     Status: None   Collection Time: 08/12/14  7:30 AM  Result Value Ref Range   Magnesium 1.7 1.5 - 2.5 mg/dL    Ct Head Wo Contrast  08/11/2014   CLINICAL DATA:  Facial injury after falling and gravel while walking dog.  EXAM: CT HEAD WITHOUT CONTRAST  CT MAXILLOFACIAL WITHOUT CONTRAST  CT CERVICAL SPINE WITHOUT CONTRAST  TECHNIQUE: Multidetector CT imaging of the head, cervical spine, and maxillofacial structures were performed using the standard protocol without intravenous contrast. Multiplanar CT image reconstructions of the cervical spine and maxillofacial structures were also generated.  COMPARISON:  CT scan of January 13, 2013.  FINDINGS: CT HEAD FINDINGS  Bony calvarium appears intact. Mild diffuse cortical atrophy is noted. Mild chronic ischemic white matter disease is noted. No mass effect or midline shift is noted. Ventricular size is within normal limits. There is no evidence of mass lesion or acute infarction. Small subdural hematoma is noted in right temporal region and middle cranial fossa with maximum thickness of 3 mm.  CT MAXILLOFACIAL FINDINGS  No fracture or other bony abnormality is noted. Pterygoid plates appear normal. Paranasal sinuses appear normal. Globes and orbits appear normal.  CT CERVICAL SPINE FINDINGS  No fracture or spondylolisthesis is noted. Moderate degenerative disc disease is noted at C5-6 and C6-7.  Mild degenerative changes seen involving the posterior  facet joints. Visualized portion of lung apices appear normal.  IMPRESSION: No significant abnormality seen in the maxillofacial region.  Moderate degenerative disc disease is noted at C5-6 and C6-7. No acute abnormality seen in the cervical spine.  Small right temporal subdural hematoma is noted without significant mass effect or midline shift. Critical Value/emergent results were called by telephone at the time of interpretation on 08/11/2014 at 6:40 pm to Dr. Fredia Sorrow , who verbally acknowledged these results.   Electronically Signed   By: Marijo Conception, M.D.   On: 08/11/2014 18:41   Ct Cervical Spine Wo Contrast  08/11/2014   CLINICAL DATA:  Facial injury after falling and gravel while walking dog.  EXAM: CT HEAD WITHOUT CONTRAST  CT MAXILLOFACIAL WITHOUT CONTRAST  CT CERVICAL SPINE WITHOUT CONTRAST  TECHNIQUE: Multidetector CT imaging of the head, cervical spine, and maxillofacial structures were performed using the standard protocol without intravenous contrast. Multiplanar CT image reconstructions of the cervical spine and maxillofacial structures were also generated.  COMPARISON:  CT scan of January 13, 2013.  FINDINGS: CT HEAD FINDINGS  Bony calvarium appears intact. Mild diffuse cortical atrophy is noted. Mild chronic ischemic white matter disease is noted. No mass effect or midline shift is noted. Ventricular size is within normal limits. There is no evidence of mass lesion or acute infarction. Small subdural hematoma is noted in right temporal region and middle cranial fossa with maximum thickness of 3 mm.  CT MAXILLOFACIAL FINDINGS  No fracture or other bony abnormality is noted. Pterygoid plates appear normal. Paranasal sinuses appear normal. Globes and orbits appear normal.  CT CERVICAL SPINE FINDINGS  No fracture or spondylolisthesis is noted. Moderate degenerative disc disease is noted at C5-6 and C6-7. Mild degenerative changes seen involving the posterior facet joints. Visualized portion  of lung apices appear normal.  IMPRESSION: No significant abnormality seen in the maxillofacial region.  Moderate degenerative disc disease is noted at C5-6 and C6-7. No acute abnormality seen in the cervical spine.  Small right temporal subdural hematoma is noted without significant mass effect or midline shift. Critical Value/emergent results were called by telephone at the time of interpretation on 08/11/2014 at 6:40 pm to Dr. Fredia Sorrow , who verbally acknowledged these results.   Electronically Signed   By: Marijo Conception, M.D.   On: 08/11/2014 18:41   Dg Knee Complete 4 Views Left  08/11/2014   CLINICAL DATA:  Fall, left knee pain.  EXAM: LEFT KNEE - COMPLETE 4+ VIEW  COMPARISON:  MRI 09/17/2003.  FINDINGS: There is a fracture through the inferior pole of the left pigtail. Minimal displacement of the inferior fracture fragment. No additional fracture. Small joint effusion. Joint spaces are maintained.  IMPRESSION: Fracture through the inferior pole of the left patella.   Electronically Signed   By: Rolm Baptise M.D.   On: 08/11/2014 18:55   Dg Knee Complete 4 Views Right  08/11/2014   CLINICAL DATA:  Fall onto left knee today with injury. Difficulty moving the knee.  EXAM: RIGHT KNEE - COMPLETE 4+ VIEW  COMPARISON:  None.  FINDINGS: Bony demineralization. Prepatellar soft tissue swelling. Indistinct patellar tendon without overt patella alta. Chronic appearing spurring along the quadriceps attachment site to the patella. No overt knee effusion.  IMPRESSION: 1. Prepatellar edema or bursitis. Indistinct patellar tendon, but without patella alta to suggest rupture of the patellar tendon. Correlate with any patellar laxity at physical exam. 2. Bony demineralization.  Electronically Signed   By: Van Clines M.D.   On: 08/11/2014 18:56   Dg Hips Bilat With Pelvis 2v  08/11/2014   CLINICAL DATA:  [place] outside today. Left knee pain. Difficulty moving.  EXAM: BILATERAL HIP (WITH PELVIS) 2 VIEWS   COMPARISON:  Pelvis 03/19/2012  FINDINGS: Pelvis and both hips appear intact. No acute fracture or dislocation identified. No focal bone lesion or bone destruction. SI joints and symphysis pubis are not displaced. Vascular calcifications are present. Degenerative changes in the lower lumbar spine.  IMPRESSION: No acute fracture or dislocation demonstrated in the pelvis or hips.   Electronically Signed   By: Lucienne Capers M.D.   On: 08/11/2014 18:58   Ct Maxillofacial Wo Cm  08/11/2014   CLINICAL DATA:  Facial injury after falling and gravel while walking dog.  EXAM: CT HEAD WITHOUT CONTRAST  CT MAXILLOFACIAL WITHOUT CONTRAST  CT CERVICAL SPINE WITHOUT CONTRAST  TECHNIQUE: Multidetector CT imaging of the head, cervical spine, and maxillofacial structures were performed using the standard protocol without intravenous contrast. Multiplanar CT image reconstructions of the cervical spine and maxillofacial structures were also generated.  COMPARISON:  CT scan of January 13, 2013.  FINDINGS: CT HEAD FINDINGS  Bony calvarium appears intact. Mild diffuse cortical atrophy is noted. Mild chronic ischemic white matter disease is noted. No mass effect or midline shift is noted. Ventricular size is within normal limits. There is no evidence of mass lesion or acute infarction. Small subdural hematoma is noted in right temporal region and middle cranial fossa with maximum thickness of 3 mm.  CT MAXILLOFACIAL FINDINGS  No fracture or other bony abnormality is noted. Pterygoid plates appear normal. Paranasal sinuses appear normal. Globes and orbits appear normal.  CT CERVICAL SPINE FINDINGS  No fracture or spondylolisthesis is noted. Moderate degenerative disc disease is noted at C5-6 and C6-7. Mild degenerative changes seen involving the posterior facet joints. Visualized portion of lung apices appear normal.  IMPRESSION: No significant abnormality seen in the maxillofacial region.  Moderate degenerative disc disease is noted at  C5-6 and C6-7. No acute abnormality seen in the cervical spine.  Small right temporal subdural hematoma is noted without significant mass effect or midline shift. Critical Value/emergent results were called by telephone at the time of interpretation on 08/11/2014 at 6:40 pm to Dr. Fredia Sorrow , who verbally acknowledged these results.   Electronically Signed   By: Marijo Conception, M.D.   On: 08/11/2014 18:41    ROS Blood pressure 155/62, pulse 97, temperature 98.8 F (37.1 C), temperature source Oral, resp. rate 20, height 5' 2"  (1.575 m), weight 54 kg (119 lb 0.8 oz), SpO2 97 %. Physical Exam  Musculoskeletal:       Right knee: Normal.       Left knee: She exhibits decreased range of motion, ecchymosis and bony tenderness. Tenderness found. Patellar tendon tenderness noted.   Her left knee does have pain over the inferior pole of the patella.  Her patella tendon and quad tendon are intact and the fracture is non-displaced. Her right knee exam in normal - she easily fully flexes and extends this knee   Assessment/Plan: Left knee with non-displaced inferior pole of the patella fracture 1)  This fracture should do quite well.  I spoke to her and family at the bedside about keeping her left knee fully extended for now to allow the fracture to consolidate.  She can put full weight on her left leg as tolerated in a  knee immobilizer.  I will order PT to work with her mobility, balance, and coordination.  I will leave follow-up information and instructions in EPIC.  Mcarthur Rossetti 08/12/2014, 12:17 PM

## 2014-08-12 NOTE — Progress Notes (Signed)
[facility] PROGRESS NOTE  Heather Floyd:[id number] DOB: March 07, 1933 DOA: 08/11/2014 PCP: Gretel Acre, MD  Assessment/Plan: 1. Subdural hematoma- patient has small subdural hematoma no focal neurologic findings on examination. Neurosurgery was consulted yesterday and they recommended no surgery needed. I discussed with Dr. Venetia Maxon today and he recommends to repeat CT head. No surgery recommended. 2. Left patella fracture- x-ray of the left knee shows patellar fracture. Knee braces placed. Called orthopedic surgeon who will see the patient tonight. 3. Hypokalemia- will replace potassium, check BMP in a.m. 4. Hyperlipidemia- continue Lipitor, fenofibrate 5. Dementia- no behavior disturbance, continue Namenda 6. Hypertension- continue lisinopril 20 by mouth daily, will also add metoprolol 25 mg twice a day as blood pressure is elevated.  Code Status: Full code Family Communication: *Discussed with family at bedside Disposition Plan: To be decided   Consultants:  Orthopedics  Neurosurgery  Procedures:  None  Antibiotics:  *None  HPI/Subjective: 81 y.o. female with a past medical history of hypertension, hyperlipidemia, RLS, TIA, carotid artery stenosis, dementia, who presents with Left knee pain and head injury after fall.  Patient was walking her dog and fell onto her face today. She injured her hands, knees, face and head. She has a bruise over her face, pain over left knee joint. Patient denies fever, chills, fatigue, headaches, cough, chest pain, SOB, abdominal pain, diarrhea, constipation, dysuria, urgency, frequency, hematuria, skin rashes, joint pain or leg swelling. No unilateral weakness, numbness or tingling sensations. No vision change or hearing loss.  in ED, patient was found to have small SDH of Rt Temporal lobe by CT scan-head, Left Patellar fx on Knee Xray, potassium level = 2.8. Patient is admitted to inpatient for further evaluation and treatment. Neurosurgery  was consulted by ED.  Today patient complains of knee pain.   Objective: Filed Vitals:   08/12/14 0909  BP: 155/62  Pulse: 97  Temp: 98.8 F (37.1 C)  Resp: 20    Intake/Output Summary (Last 24 hours) at 08/12/14 1221 Last data filed at 08/12/14 0910  Gross per 24 hour  Intake    240 ml  Output      0 ml  Net    240 ml   Filed Weights   08/11/14 2216 08/12/14 0500  Weight: 54 kg (119 lb 0.8 oz) 54 kg (119 lb 0.8 oz)    Exam:   General:  Appearing no acute distress  Cardiovascular: S1-S2 regular  Respiratory: Clear bilaterally  Abdomen: Soft nontender no organomegaly  Musculoskeletal: Left knee in brace   Data Reviewed: Basic Metabolic Panel:  Recent Labs Lab 08/11/14 1850 08/12/14 0155 08/12/14 0730  NA 138 140  --   K 2.8* 3.2*  --   CL 102 104  --   CO2 29 28  --   GLUCOSE 121* 135*  --   BUN 10 8  --   CREATININE 0.70 0.73  --   CALCIUM 9.4 8.5  --   MG  --   --  1.7   Liver Function Tests: No results for input(s): AST, ALT, ALKPHOS, BILITOT, PROT, ALBUMIN in the last 168 hours. No results for input(s): LIPASE, AMYLASE in the last 168 hours. No results for input(s): AMMONIA in the last 168 hours. CBC:  Recent Labs Lab 08/11/14 1850 08/12/14 0155  WBC 10.9* 9.2  NEUTROABS 8.4*  --   HGB 14.9 12.1  HCT 42.1 34.4*  MCV 86.8 86.9  PLT 274 241   Cardiac Enzymes: No results for input(s): CKTOTAL,  CKMB, CKMBINDEX, TROPONINI in the last 168 hours. BNP (last 3 results) No results for input(s): BNP in the last 8760 hours.  ProBNP (last 3 results) No results for input(s): PROBNP in the last 8760 hours.  CBG: No results for input(s): GLUCAP in the last 168 hours.  No results found for this or any previous visit (from the past 240 hour(s)).   Studies: Ct Head Wo Contrast  08/11/2014   CLINICAL DATA:  Facial injury after falling and gravel while walking dog.  EXAM: CT HEAD WITHOUT CONTRAST  CT MAXILLOFACIAL WITHOUT CONTRAST  CT CERVICAL  SPINE WITHOUT CONTRAST  TECHNIQUE: Multidetector CT imaging of the head, cervical spine, and maxillofacial structures were performed using the standard protocol without intravenous contrast. Multiplanar CT image reconstructions of the cervical spine and maxillofacial structures were also generated.  COMPARISON:  CT scan of January 13, 2013.  FINDINGS: CT HEAD FINDINGS  Bony calvarium appears intact. Mild diffuse cortical atrophy is noted. Mild chronic ischemic white matter disease is noted. No mass effect or midline shift is noted. Ventricular size is within normal limits. There is no evidence of mass lesion or acute infarction. Small subdural hematoma is noted in right temporal region and middle cranial fossa with maximum thickness of 3 mm.  CT MAXILLOFACIAL FINDINGS  No fracture or other bony abnormality is noted. Pterygoid plates appear normal. Paranasal sinuses appear normal. Globes and orbits appear normal.  CT CERVICAL SPINE FINDINGS  No fracture or spondylolisthesis is noted. Moderate degenerative disc disease is noted at C5-6 and C6-7. Mild degenerative changes seen involving the posterior facet joints. Visualized portion of lung apices appear normal.  IMPRESSION: No significant abnormality seen in the maxillofacial region.  Moderate degenerative disc disease is noted at C5-6 and C6-7. No acute abnormality seen in the cervical spine.  Small right temporal subdural hematoma is noted without significant mass effect or midline shift. Critical Value/emergent results were called by telephone at the time of interpretation on 08/11/2014 at 6:40 pm to Dr. Vanetta MuldersSCOTT ZACKOWSKI , who verbally acknowledged these results.   Electronically Signed   By: Lupita RaiderJames  Green Jr, M.D.   On: 08/11/2014 18:41   Ct Cervical Spine Wo Contrast  08/11/2014   CLINICAL DATA:  Facial injury after falling and gravel while walking dog.  EXAM: CT HEAD WITHOUT CONTRAST  CT MAXILLOFACIAL WITHOUT CONTRAST  CT CERVICAL SPINE WITHOUT CONTRAST  TECHNIQUE:  Multidetector CT imaging of the head, cervical spine, and maxillofacial structures were performed using the standard protocol without intravenous contrast. Multiplanar CT image reconstructions of the cervical spine and maxillofacial structures were also generated.  COMPARISON:  CT scan of January 13, 2013.  FINDINGS: CT HEAD FINDINGS  Bony calvarium appears intact. Mild diffuse cortical atrophy is noted. Mild chronic ischemic white matter disease is noted. No mass effect or midline shift is noted. Ventricular size is within normal limits. There is no evidence of mass lesion or acute infarction. Small subdural hematoma is noted in right temporal region and middle cranial fossa with maximum thickness of 3 mm.  CT MAXILLOFACIAL FINDINGS  No fracture or other bony abnormality is noted. Pterygoid plates appear normal. Paranasal sinuses appear normal. Globes and orbits appear normal.  CT CERVICAL SPINE FINDINGS  No fracture or spondylolisthesis is noted. Moderate degenerative disc disease is noted at C5-6 and C6-7. Mild degenerative changes seen involving the posterior facet joints. Visualized portion of lung apices appear normal.  IMPRESSION: No significant abnormality seen in the maxillofacial region.  Moderate degenerative disc  disease is noted at C5-6 and C6-7. No acute abnormality seen in the cervical spine.  Small right temporal subdural hematoma is noted without significant mass effect or midline shift. Critical Value/emergent results were called by telephone at the time of interpretation on 08/11/2014 at 6:40 pm to Dr. Vanetta Mulders , who verbally acknowledged these results.   Electronically Signed   By: Lupita Raider, M.D.   On: 08/11/2014 18:41   Dg Knee Complete 4 Views Left  08/11/2014   CLINICAL DATA:  Fall, left knee pain.  EXAM: LEFT KNEE - COMPLETE 4+ VIEW  COMPARISON:  MRI 09/17/2003.  FINDINGS: There is a fracture through the inferior pole of the left pigtail. Minimal displacement of the inferior  fracture fragment. No additional fracture. Small joint effusion. Joint spaces are maintained.  IMPRESSION: Fracture through the inferior pole of the left patella.   Electronically Signed   By: Charlett Nose M.D.   On: 08/11/2014 18:55   Dg Knee Complete 4 Views Right  08/11/2014   CLINICAL DATA:  Fall onto left knee today with injury. Difficulty moving the knee.  EXAM: RIGHT KNEE - COMPLETE 4+ VIEW  COMPARISON:  None.  FINDINGS: Bony demineralization. Prepatellar soft tissue swelling. Indistinct patellar tendon without overt patella alta. Chronic appearing spurring along the quadriceps attachment site to the patella. No overt knee effusion.  IMPRESSION: 1. Prepatellar edema or bursitis. Indistinct patellar tendon, but without patella alta to suggest rupture of the patellar tendon. Correlate with any patellar laxity at physical exam. 2. Bony demineralization.   Electronically Signed   By: Gaylyn Rong M.D.   On: 08/11/2014 18:56   Dg Hips Bilat With Pelvis 2v  08/11/2014   CLINICAL DATA:  Larey Seat outside today. Left knee pain. Difficulty moving.  EXAM: BILATERAL HIP (WITH PELVIS) 2 VIEWS  COMPARISON:  Pelvis 03/19/2012  FINDINGS: Pelvis and both hips appear intact. No acute fracture or dislocation identified. No focal bone lesion or bone destruction. SI joints and symphysis pubis are not displaced. Vascular calcifications are present. Degenerative changes in the lower lumbar spine.  IMPRESSION: No acute fracture or dislocation demonstrated in the pelvis or hips.   Electronically Signed   By: Burman Nieves M.D.   On: 08/11/2014 18:58   Ct Maxillofacial Wo Cm  08/11/2014   CLINICAL DATA:  Facial injury after falling and gravel while walking dog.  EXAM: CT HEAD WITHOUT CONTRAST  CT MAXILLOFACIAL WITHOUT CONTRAST  CT CERVICAL SPINE WITHOUT CONTRAST  TECHNIQUE: Multidetector CT imaging of the head, cervical spine, and maxillofacial structures were performed using the standard protocol without intravenous  contrast. Multiplanar CT image reconstructions of the cervical spine and maxillofacial structures were also generated.  COMPARISON:  CT scan of January 13, 2013.  FINDINGS: CT HEAD FINDINGS  Bony calvarium appears intact. Mild diffuse cortical atrophy is noted. Mild chronic ischemic white matter disease is noted. No mass effect or midline shift is noted. Ventricular size is within normal limits. There is no evidence of mass lesion or acute infarction. Small subdural hematoma is noted in right temporal region and middle cranial fossa with maximum thickness of 3 mm.  CT MAXILLOFACIAL FINDINGS  No fracture or other bony abnormality is noted. Pterygoid plates appear normal. Paranasal sinuses appear normal. Globes and orbits appear normal.  CT CERVICAL SPINE FINDINGS  No fracture or spondylolisthesis is noted. Moderate degenerative disc disease is noted at C5-6 and C6-7. Mild degenerative changes seen involving the posterior facet joints. Visualized portion of lung apices  appear normal.  IMPRESSION: No significant abnormality seen in the maxillofacial region.  Moderate degenerative disc disease is noted at C5-6 and C6-7. No acute abnormality seen in the cervical spine.  Small right temporal subdural hematoma is noted without significant mass effect or midline shift. Critical Value/emergent results were called by telephone at the time of interpretation on 08/11/2014 at 6:40 pm to Dr. Vanetta Mulders , who verbally acknowledged these results.   Electronically Signed   By: Lupita Raider, M.D.   On: 08/11/2014 18:41    Scheduled Meds: . atorvastatin  40 mg Oral q1800  . cholecalciferol  2,000 Units Oral Daily  . fenofibrate  160 mg Oral Daily  . lisinopril  20 mg Oral q morning - 10a  . memantine  28 mg Oral Daily  . multivitamin with minerals  1 tablet Oral q morning - 10a  . sodium chloride  3 mL Intravenous Q12H  . vitamin C  500 mg Oral Daily   Continuous Infusions: . sodium chloride 75 mL/hr at 08/11/14 2010     Principal Problem:   Subdural hematoma Active Problems:   Hyperlipidemia   Hypertension   RLS (restless legs syndrome)   DJD (degenerative joint disease) of cervical spine   TIA (transient ischemic attack)   Carotid artery occlusion   Hypokalemia   Patellar fracture   SDH (subdural hematoma)   Essential hypertension    Time spent: 25 minutes    [facility]  [facility] Pager [phone]*. If 7PM-7AM, please contact night-coverage at [email or web address], password [facility] 08/12/2014, 12:21 PM  LOS: 1 day

## 2014-08-13 ENCOUNTER — Inpatient Hospital Stay (HOSPITAL_COMMUNITY): Payer: Medicare Other

## 2014-08-13 DIAGNOSIS — E785 Hyperlipidemia, unspecified: Secondary | ICD-10-CM

## 2014-08-13 LAB — BASIC METABOLIC PANEL
Anion gap: 3 — ABNORMAL LOW (ref 5–15)
BUN: 8 mg/dL (ref 6–23)
CALCIUM: 8.5 mg/dL (ref 8.4–10.5)
CHLORIDE: 109 mmol/L (ref 96–112)
CO2: 26 mmol/L (ref 19–32)
Creatinine, Ser: 0.81 mg/dL (ref 0.50–1.10)
GFR calc non Af Amer: 67 mL/min — ABNORMAL LOW (ref >90)
GFR, EST AFRICAN AMERICAN: 78 mL/min — AB (ref >90)
GLUCOSE: 114 mg/dL — AB (ref 70–99)
Potassium: 4.9 mmol/L (ref 3.5–5.1)
Sodium: 138 mmol/L (ref 135–145)

## 2014-08-13 LAB — URINALYSIS, ROUTINE W REFLEX MICROSCOPIC
BILIRUBIN URINE: NEGATIVE
GLUCOSE, UA: NEGATIVE mg/dL
HGB URINE DIPSTICK: NEGATIVE
Ketones, ur: NEGATIVE mg/dL
Leukocytes, UA: NEGATIVE
Nitrite: NEGATIVE
PH: 6.5 (ref 5.0–8.0)
Protein, ur: NEGATIVE mg/dL
SPECIFIC GRAVITY, URINE: 1.014 (ref 1.005–1.030)
Urobilinogen, UA: 0.2 mg/dL (ref 0.0–1.0)

## 2014-08-13 LAB — CBC
HCT: 35 % — ABNORMAL LOW (ref 36.0–46.0)
Hemoglobin: 11.6 g/dL — ABNORMAL LOW (ref 12.0–15.0)
MCH: 31.1 pg (ref 26.0–34.0)
MCHC: 33.1 g/dL (ref 30.0–36.0)
MCV: 93.8 fL (ref 78.0–100.0)
PLATELETS: 214 10*3/uL (ref 150–400)
RBC: 3.73 MIL/uL — ABNORMAL LOW (ref 3.87–5.11)
RDW: 13.2 % (ref 11.5–15.5)
WBC: 8.7 10*3/uL (ref 4.0–10.5)

## 2014-08-13 MED ORDER — ACETAMINOPHEN 325 MG PO TABS
650.0000 mg | ORAL_TABLET | Freq: Once | ORAL | Status: AC
  Administered 2014-08-13: 650 mg via ORAL
  Filled 2014-08-13: qty 2

## 2014-08-13 MED ORDER — ALPRAZOLAM 0.5 MG PO TABS
1.0000 mg | ORAL_TABLET | Freq: Three times a day (TID) | ORAL | Status: DC | PRN
  Administered 2014-08-13 – 2014-08-15 (×4): 1 mg via ORAL
  Filled 2014-08-13 (×4): qty 2

## 2014-08-13 MED ORDER — CEFTRIAXONE SODIUM IN DEXTROSE 20 MG/ML IV SOLN
1.0000 g | INTRAVENOUS | Status: DC
  Administered 2014-08-13 – 2014-08-15 (×3): 1 g via INTRAVENOUS
  Filled 2014-08-13 (×3): qty 50

## 2014-08-13 NOTE — Progress Notes (Signed)
UR Completed.  [phone]  

## 2014-08-13 NOTE — Progress Notes (Signed)
Patient agitated, removing her tele and pulled her iv, states she wants to go home. MD paged to see if patient can be put on restraints.

## 2014-08-13 NOTE — Progress Notes (Signed)
OT Cancellation Note  Patient Details Name: Heather Floyd MRN: [id number] DOB: 09/03/1932   Cancelled Treatment:    Reason Eval/Treat Not Completed: Other (comment) Nursing working with pt. Having difficulty taking her pill. Upset and says she is "choking". Will return this pm if able when pt less agitated/upset. [facility] LLC[facility],HILLARY  Shala Baumbach, OTR/L  [phone] 08/13/2014 08/13/2014, 12:18 PM

## 2014-08-13 NOTE — Progress Notes (Signed)
Patient attempting to get out of bed unassisted  All night. Has immobilizer to left leg.  Pulled Iv out in the process and having frequent urination. Patient also has a temp of 101.5. Notified new orders for tylenol . UA/C&S and portable CXR. Will continue to monitor.

## 2014-08-13 NOTE — Progress Notes (Signed)
[facility] PROGRESS NOTE  Jilda PandaGail M Nguyenthi ZOX:[id number] DOB: 02/20/1933 DOA: 08/11/2014 PCP: Gretel AcreNNODI, ADAKU, MD  Assessment/Plan: 1. Subdural hematoma- patient has small subdural hematoma no focal neurologic findings on examination. Neurosurgery was consulted yesterday and they recommended no surgery needed. I discussed with Dr. Venetia MaxonStern today and he recommended to repeat CT head. No surgery recommended. Repeat head CT showed some improvement in small subdural hematoma of 2 mm today. Previous size was 3 mm. 2. Agitation- will increase the dose of Xanax to 1 mg every 8 hours when necessary 3. Left patella fracture- x-ray of the left knee shows patellar fracture. Left knee in immobilizer, orthopedics recommend physical therapy weightbearing as tolerated in the left knee immobilizer. Will get physical therapy consultation. 4. Hypokalemia- resolved 5. Hyperlipidemia- continue Lipitor, fenofibrate 6. Dementia- no behavior disturbance, continue Namenda 7. Hypertension- continue lisinopril 20 by mouth daily, also added metoprolol 25 mg twice a day was blood pressure is elevated. 8. DVT prophylaxis- bilateral SCDs  Code Status: Full code Family Communication: *Discussed with family at bedside Disposition Plan: To be decided   Consultants:  Orthopedics  Neurosurgery  Procedures:  None  Antibiotics:  *None  HPI/Subjective: 81 y.o. female with a past medical history of hypertension, hyperlipidemia, RLS, TIA, carotid artery stenosis, dementia, who presents with Left knee pain and head injury after fall.  Patient was walking her dog and fell onto her face today. She injured her hands, knees, face and head. She has a bruise over her face, pain over left knee joint. Patient denies fever, chills, fatigue, headaches, cough, chest pain, SOB, abdominal pain, diarrhea, constipation, dysuria, urgency, frequency, hematuria, skin rashes, joint pain or leg swelling. No unilateral weakness, numbness or  tingling sensations. No vision change or hearing loss.  in ED, patient was found to have small SDH of Rt Temporal lobe by CT scan-head, Left Patellar fx on Knee Xray, potassium level = 2.8. Patient is admitted to inpatient for further evaluation and treatment. Neurosurgery was consulted by ED.  This morning patient was confused and agitated. Did not sleep well last night.  Objective: Filed Vitals:   08/13/14 0900  BP: 128/48  Pulse: 74  Temp: 97.5 F (36.4 C)  Resp: 20    Intake/Output Summary (Last 24 hours) at 08/13/14 1043 Last data filed at 08/12/14 1754  Gross per 24 hour  Intake    240 ml  Output      0 ml  Net    240 ml   Filed Weights   08/11/14 2216 08/12/14 0500 08/13/14 0358  Weight: 54 kg (119 lb 0.8 oz) 54 kg (119 lb 0.8 oz) 55 kg (121 lb 4.1 oz)    Exam:   General:  Appearing no acute distress  Cardiovascular: S1-S2 regular  Respiratory: Clear bilaterally  Abdomen: Soft nontender no organomegaly  Musculoskeletal: Left knee in brace   Data Reviewed: Basic Metabolic Panel:  Recent Labs Lab 08/11/14 1850 08/12/14 0155 08/12/14 0730 08/13/14 0523  NA 138 140  --  138  K 2.8* 3.2*  --  4.9  CL 102 104  --  109  CO2 29 28  --  26  GLUCOSE 121* 135*  --  114*  BUN 10 8  --  8  CREATININE 0.70 0.73  --  0.81  CALCIUM 9.4 8.5  --  8.5  MG  --   --  1.7  --    Liver Function Tests: No results for input(s): AST, ALT, ALKPHOS, BILITOT, PROT, ALBUMIN in  the last 168 hours. No results for input(s): LIPASE, AMYLASE in the last 168 hours. No results for input(s): AMMONIA in the last 168 hours. CBC:  Recent Labs Lab 08/11/14 1850 08/12/14 0155  WBC 10.9* 9.2  NEUTROABS 8.4*  --   HGB 14.9 12.1  HCT 42.1 34.4*  MCV 86.8 86.9  PLT 274 241   Cardiac Enzymes: No results for input(s): CKTOTAL, CKMB, CKMBINDEX, TROPONINI in the last 168 hours. BNP (last 3 results) No results for input(s): BNP in the last 8760 hours.  ProBNP (last 3 results) No  results for input(s): PROBNP in the last 8760 hours.  CBG: No results for input(s): GLUCAP in the last 168 hours.  No results found for this or any previous visit (from the past 240 hour(s)).   Studies: Ct Head Wo Contrast  08/12/2014   CLINICAL DATA:  Subdural hematoma.  EXAM: CT HEAD WITHOUT CONTRAST  TECHNIQUE: Contiguous axial images were obtained from the base of the skull through the vertex without intravenous contrast.  COMPARISON:  CT of the head 08/11/2014  FINDINGS: Again noted is small subdural hematoma involving the right temporal fossa, now measuring approximately 2 mm. There is no associated mass effect or midline shift. No new hemorrhage identified. No intra or extra-axial mass. Mild central cortical atrophy. Basilar cisterns and ventricles have a normal appearance.  Bone windows show atherosclerotic calcification of the internal carotid arteries. No calvarial fracture.  IMPRESSION: 1. Smaller of persistent right temporal fossa subdural hematoma. 2. Stable minimal atrophy.   Electronically Signed   By: Norva Pavlov M.D.   On: 08/12/2014 14:14   Ct Head Wo Contrast  08/11/2014   CLINICAL DATA:  Facial injury after falling and gravel while walking dog.  EXAM: CT HEAD WITHOUT CONTRAST  CT MAXILLOFACIAL WITHOUT CONTRAST  CT CERVICAL SPINE WITHOUT CONTRAST  TECHNIQUE: Multidetector CT imaging of the head, cervical spine, and maxillofacial structures were performed using the standard protocol without intravenous contrast. Multiplanar CT image reconstructions of the cervical spine and maxillofacial structures were also generated.  COMPARISON:  CT scan of January 13, 2013.  FINDINGS: CT HEAD FINDINGS  Bony calvarium appears intact. Mild diffuse cortical atrophy is noted. Mild chronic ischemic white matter disease is noted. No mass effect or midline shift is noted. Ventricular size is within normal limits. There is no evidence of mass lesion or acute infarction. Small subdural hematoma is noted in  right temporal region and middle cranial fossa with maximum thickness of 3 mm.  CT MAXILLOFACIAL FINDINGS  No fracture or other bony abnormality is noted. Pterygoid plates appear normal. Paranasal sinuses appear normal. Globes and orbits appear normal.  CT CERVICAL SPINE FINDINGS  No fracture or spondylolisthesis is noted. Moderate degenerative disc disease is noted at C5-6 and C6-7. Mild degenerative changes seen involving the posterior facet joints. Visualized portion of lung apices appear normal.  IMPRESSION: No significant abnormality seen in the maxillofacial region.  Moderate degenerative disc disease is noted at C5-6 and C6-7. No acute abnormality seen in the cervical spine.  Small right temporal subdural hematoma is noted without significant mass effect or midline shift. Critical Value/emergent results were called by telephone at the time of interpretation on 08/11/2014 at 6:40 pm to Dr. Vanetta Mulders , who verbally acknowledged these results.   Electronically Signed   By: Lupita Raider, M.D.   On: 08/11/2014 18:41   Ct Cervical Spine Wo Contrast  08/11/2014   CLINICAL DATA:  Facial injury after falling and gravel  while walking dog.  EXAM: CT HEAD WITHOUT CONTRAST  CT MAXILLOFACIAL WITHOUT CONTRAST  CT CERVICAL SPINE WITHOUT CONTRAST  TECHNIQUE: Multidetector CT imaging of the head, cervical spine, and maxillofacial structures were performed using the standard protocol without intravenous contrast. Multiplanar CT image reconstructions of the cervical spine and maxillofacial structures were also generated.  COMPARISON:  CT scan of January 13, 2013.  FINDINGS: CT HEAD FINDINGS  Bony calvarium appears intact. Mild diffuse cortical atrophy is noted. Mild chronic ischemic white matter disease is noted. No mass effect or midline shift is noted. Ventricular size is within normal limits. There is no evidence of mass lesion or acute infarction. Small subdural hematoma is noted in right temporal region and middle  cranial fossa with maximum thickness of 3 mm.  CT MAXILLOFACIAL FINDINGS  No fracture or other bony abnormality is noted. Pterygoid plates appear normal. Paranasal sinuses appear normal. Globes and orbits appear normal.  CT CERVICAL SPINE FINDINGS  No fracture or spondylolisthesis is noted. Moderate degenerative disc disease is noted at C5-6 and C6-7. Mild degenerative changes seen involving the posterior facet joints. Visualized portion of lung apices appear normal.  IMPRESSION: No significant abnormality seen in the maxillofacial region.  Moderate degenerative disc disease is noted at C5-6 and C6-7. No acute abnormality seen in the cervical spine.  Small right temporal subdural hematoma is noted without significant mass effect or midline shift. Critical Value/emergent results were called by telephone at the time of interpretation on 08/11/2014 at 6:40 pm to Dr. Vanetta Mulders , who verbally acknowledged these results.   Electronically Signed   By: Lupita Raider, M.D.   On: 08/11/2014 18:41   Dg Chest Port 1 View  08/13/2014   CLINICAL DATA:  New onset fever  EXAM: PORTABLE CHEST - 1 VIEW  COMPARISON:  05/03/2013  FINDINGS: Normal cardiac silhouette. There is mild basilar atelectasis. No effusion, infiltrate, or pneumothorax. Mild blunting of the left costophrenic angle similar prior.  IMPRESSION: Mild basilar atelectasis.  No infiltrate.   Electronically Signed   By: Genevive Bi M.D.   On: 08/13/2014 08:00   Dg Knee Complete 4 Views Left  08/11/2014   CLINICAL DATA:  Fall, left knee pain.  EXAM: LEFT KNEE - COMPLETE 4+ VIEW  COMPARISON:  MRI 09/17/2003.  FINDINGS: There is a fracture through the inferior pole of the left pigtail. Minimal displacement of the inferior fracture fragment. No additional fracture. Small joint effusion. Joint spaces are maintained.  IMPRESSION: Fracture through the inferior pole of the left patella.   Electronically Signed   By: Charlett Nose M.D.   On: 08/11/2014 18:55    Dg Knee Complete 4 Views Right  08/11/2014   CLINICAL DATA:  Fall onto left knee today with injury. Difficulty moving the knee.  EXAM: RIGHT KNEE - COMPLETE 4+ VIEW  COMPARISON:  None.  FINDINGS: Bony demineralization. Prepatellar soft tissue swelling. Indistinct patellar tendon without overt patella alta. Chronic appearing spurring along the quadriceps attachment site to the patella. No overt knee effusion.  IMPRESSION: 1. Prepatellar edema or bursitis. Indistinct patellar tendon, but without patella alta to suggest rupture of the patellar tendon. Correlate with any patellar laxity at physical exam. 2. Bony demineralization.   Electronically Signed   By: Gaylyn Rong M.D.   On: 08/11/2014 18:56   Dg Hips Bilat With Pelvis 2v  08/11/2014   CLINICAL DATA:  Larey Seat outside today. Left knee pain. Difficulty moving.  EXAM: BILATERAL HIP (WITH PELVIS) 2 VIEWS  COMPARISON:  Pelvis 03/19/2012  FINDINGS: Pelvis and both hips appear intact. No acute fracture or dislocation identified. No focal bone lesion or bone destruction. SI joints and symphysis pubis are not displaced. Vascular calcifications are present. Degenerative changes in the lower lumbar spine.  IMPRESSION: No acute fracture or dislocation demonstrated in the pelvis or hips.   Electronically Signed   By: Burman Nieves M.D.   On: 08/11/2014 18:58   Ct Maxillofacial Wo Cm  08/11/2014   CLINICAL DATA:  Facial injury after falling and gravel while walking dog.  EXAM: CT HEAD WITHOUT CONTRAST  CT MAXILLOFACIAL WITHOUT CONTRAST  CT CERVICAL SPINE WITHOUT CONTRAST  TECHNIQUE: Multidetector CT imaging of the head, cervical spine, and maxillofacial structures were performed using the standard protocol without intravenous contrast. Multiplanar CT image reconstructions of the cervical spine and maxillofacial structures were also generated.  COMPARISON:  CT scan of January 13, 2013.  FINDINGS: CT HEAD FINDINGS  Bony calvarium appears intact. Mild diffuse  cortical atrophy is noted. Mild chronic ischemic white matter disease is noted. No mass effect or midline shift is noted. Ventricular size is within normal limits. There is no evidence of mass lesion or acute infarction. Small subdural hematoma is noted in right temporal region and middle cranial fossa with maximum thickness of 3 mm.  CT MAXILLOFACIAL FINDINGS  No fracture or other bony abnormality is noted. Pterygoid plates appear normal. Paranasal sinuses appear normal. Globes and orbits appear normal.  CT CERVICAL SPINE FINDINGS  No fracture or spondylolisthesis is noted. Moderate degenerative disc disease is noted at C5-6 and C6-7. Mild degenerative changes seen involving the posterior facet joints. Visualized portion of lung apices appear normal.  IMPRESSION: No significant abnormality seen in the maxillofacial region.  Moderate degenerative disc disease is noted at C5-6 and C6-7. No acute abnormality seen in the cervical spine.  Small right temporal subdural hematoma is noted without significant mass effect or midline shift. Critical Value/emergent results were called by telephone at the time of interpretation on 08/11/2014 at 6:40 pm to Dr. Vanetta Mulders , who verbally acknowledged these results.   Electronically Signed   By: Lupita Raider, M.D.   On: 08/11/2014 18:41    Scheduled Meds: . atorvastatin  40 mg Oral q1800  . cefTRIAXone (ROCEPHIN)  IV  1 g Intravenous Q24H  . cholecalciferol  2,000 Units Oral Daily  . fenofibrate  160 mg Oral Daily  . lisinopril  20 mg Oral q morning - 10a  . memantine  28 mg Oral Daily  . metoprolol tartrate  25 mg Oral BID  . multivitamin with minerals  1 tablet Oral q morning - 10a  . sodium chloride  3 mL Intravenous Q12H  . vitamin C  500 mg Oral Daily   Continuous Infusions: . sodium chloride 75 mL/hr at 08/11/14 2010    Principal Problem:   Subdural hematoma Active Problems:   Hyperlipidemia   Hypertension   RLS (restless legs syndrome)   DJD  (degenerative joint disease) of cervical spine   TIA (transient ischemic attack)   Carotid artery occlusion   Hypokalemia   Patellar fracture   SDH (subdural hematoma)   Essential hypertension    Time spent: 25 minutes    [facility]  [facility] Pager [phone]*. If 7PM-7AM, please contact night-coverage at [email or web address], password [facility] 08/13/2014, 10:43 AM  LOS: 2 days

## 2014-08-13 NOTE — Evaluation (Signed)
Physical Therapy Evaluation Patient Details Name: Heather Floyd MRN: [id number] DOB: 11/25/1932 Today's Date: 08/13/2014   History of Present Illness  81 y.[facility]. [facility] for [facility] with SDH (R temporal fossa) and L Patellar fx (non-surgical, KI at all times and WBAT).  Pt with significant PMHx of HTN, RLS, DJD cervical spine, TIA, and memory disturbance.  Clinical Impression  Pt was able to tolerate OOB to chair today, however, she is limited by pain in attempts at gait with RW and is electively keeping left leg NWB/TDWB despite encouragement to try to WB some as she is WBAT in her left leg.  She has some baseline dementia per family report, but is functional in her home environment during the day by herself.  She keeps pushing down and pulling off her knee immobilizer and is a very high safety and fall risk if left home alone.  I discussed at length with the pt and her family d/c therapy options and so far, we are all in agreement that she cannot be home alone safely at this time and they cannot provide her with 24/7 care she needs.  SNF placement for rehab is her safest option.  PT to follow acutely for deficits listed below.       Follow Up Recommendations SNF    Equipment Recommendations  Rolling walker with 5" wheels    Recommendations for Other Services   NA    Precautions / Restrictions Precautions Precautions: Fall Required Braces or Orthoses: Knee Immobilizer - Left Knee Immobilizer - Left: On at all times Restrictions Weight Bearing Restrictions: No LLE Weight Bearing: Weight bearing as tolerated      Mobility  Bed Mobility Overal bed mobility: Needs Assistance Bed Mobility: Supine to Sit     Supine to sit: Mod assist     General bed mobility comments: Mod assist to provide leverage for pt to use both hands to pull up to sitting EOB.   Transfers Overall transfer level: Needs assistance Equipment used: Rolling walker (2 wheeled);None Transfers:  Sit to/from [employer]Stand;Stand Pivot Transfers Sit to Stand: Mod assist Stand pivot transfers: Mod assist       General transfer comment: Mod assist to [facility] with hand held assist, mod assist to stand pivot with RW. pivoted x 3 and went to both right and left. Less assist needed with RW than without, but still overall mod for balance.  Pt electing due to pain to hold left leg up during transfers (TDWB to NWB) despite encouragement that she could put weight through it.   Ambulation/Gait             General Gait Details: unable due to pain today.          Balance Overall balance assessment: Needs assistance Sitting-balance support: Feet supported;No upper extremity supported Sitting balance-Leahy Scale: Good     Standing balance support: Bilateral upper extremity supported Standing balance-Leahy Scale: Poor                               Pertinent Vitals/Pain Pain Assessment: Faces Faces Pain Scale: Hurts little more Pain Location: head and left knee Pain Descriptors / Indicators: Aching;Burning;Grimacing;Guarding;Headache Pain Intervention(s): Limited activity within patient's tolerance;Monitored during session;Repositioned    Home Living Family/patient expects to be discharged to:: Private residence Living Arrangements: Children Available Help at Discharge: Family;Available PRN/intermittently (lives with daughter who works days, gone 7am-5pm) Type of  Home: House Home Access: Stairs to enter Entrance Stairs-Rails: None Entrance Stairs-Number of Steps: 2 Home Layout: One level;Other (Comment) (door thresholds would be trickly with RW per daughter) Home Equipment: None      Prior Function Level of Independence: Independent         Comments: does not drive any more, walks the family dog every day.          Extremity/Trunk Assessment   Upper Extremity Assessment: Defer to OT evaluation           Lower Extremity Assessment: LLE  deficits/detail   LLE Deficits / Details: left leg limited by pain and immobilization.  Per RN pt has been taking off and pushing down knee immobilizer (it was donned on her tibia below her knee joint when I entered the room).  KI re adjusted to fit properly.  Ankle movement at least 3/5, knee NT, hip flexion 2+/5.    Cervical / Trunk Assessment: Normal  Communication   Communication: No difficulties  Cognition Arousal/Alertness: Awake/alert Behavior During Therapy: Restless Overall Cognitive Status: History of cognitive impairments - at baseline (increased restlessness and agitation per daughter)                      General Comments General comments (skin integrity, edema, etc.): I spoke at length with pt and her family re: discharge options, including CIR, SNF, and HHPT.  We both agreed that HHPT would not be an option as she does not have 24/7 care at home and would need this for a period of time after she left the hospital.  Daughter is a little weary of her going to CIR first and then still have the possiblity of SNF placement if it does not go well as three moves (acute, CIR, and SNF) would be a lot for her dementia to handle all of that change.            Assessment/Plan    PT Assessment Patient needs continued PT services  PT Diagnosis Difficulty walking;Abnormality of gait;Generalized weakness;Acute pain   PT Problem List Decreased strength;Decreased activity tolerance;Decreased mobility;Decreased balance;Decreased cognition;Decreased knowledge of use of DME;Decreased safety awareness;Decreased knowledge of precautions;Pain  PT Treatment Interventions DME instruction;Gait training;Functional mobility training;Therapeutic activities;Therapeutic exercise;Balance training;Stair training;Cognitive remediation;Patient/family education   PT Goals (Current goals can be found in the Care Plan section) Acute Rehab PT Goals Patient Stated Goal: to go home today PT Goal  Formulation: With patient/family Time For Goal Achievement: 08/20/14 Potential to Achieve Goals: Good    Frequency Min 5X/week (until d/c recs solidified, if SNF decrease to 3 times)   Barriers to discharge Decreased caregiver support pt doesn't have help/supervision during the day at all       End of Session Equipment Utilized During Treatment: Left knee immobilizer Activity Tolerance: Patient limited by pain Patient left: in chair;with call bell/phone within reach;with chair alarm set;with nursing/sitter in room;with family/visitor present           Time: 1610-9604 PT Time Calculation (min) (ACUTE ONLY): 29 min   Charges:   PT Evaluation $Initial PT Evaluation Tier I: 1 Procedure PT Treatments $Therapeutic Activity: 8-22 mins        Selene Peltzer B. Ayomide Purdy, PT, DPT [phone]   08/13/2014, 12:35 PM

## 2014-08-14 MED ORDER — METOPROLOL TARTRATE 50 MG PO TABS
50.0000 mg | ORAL_TABLET | Freq: Two times a day (BID) | ORAL | Status: DC
  Administered 2014-08-15: 50 mg via ORAL
  Filled 2014-08-14 (×2): qty 1

## 2014-08-14 MED ORDER — HYDRALAZINE HCL 20 MG/ML IJ SOLN
5.0000 mg | Freq: Once | INTRAMUSCULAR | Status: AC
  Administered 2014-08-14: 5 mg via INTRAVENOUS
  Filled 2014-08-14: qty 1

## 2014-08-14 NOTE — Clinical Social Work Note (Signed)
Clinical Social Worker has presented bed offers to patient and pt's dtr, Bonita QuinLinda. Pt's [facility]. CSW has notified facility of bed acceptance. Pt's dtr, Bonita QuinLinda to complete admissions [facility], [facility]yn [facility].   CSW will continue to follow pt and pt's family for continued support and to facilitate pt's discharge needs once medically stable.  FL-2 on chart for MD signature.   Derenda FennelBashira Klohe Lovering, MSW, LCSWA [phone] 08/14/2014 2:53 PM

## 2014-08-14 NOTE — Progress Notes (Signed)
[facility] PROGRESS NOTE  Jilda PandaGail M Mannes ZOX:[id number] DOB: 06/17/1932 DOA: 08/11/2014 PCP: Gretel AcreNNODI, ADAKU, MD  Assessment/Plan: 1. Subdural hematoma- patient has small subdural hematoma no focal neurologic findings on examination. Neurosurgery was consulted yesterday and they recommended no surgery needed. I discussed with Dr. Venetia MaxonStern today and he recommended to repeat CT head. No surgery recommended. Repeat head CT showed some improvement in small subdural hematoma of 2 mm today. Previous size was 3 mm. 2. Agitation- will increase the dose of Xanax to 1 mg every 8 hours when necessary 3. Left patella fracture- x-ray of the left knee shows patellar fracture. Left knee in immobilizer, orthopedics recommend physical therapy weightbearing as tolerated in the left knee immobilizer. Will get physical therapy consultation. 4. Hypokalemia- resolved 5. Hyperlipidemia- continue Lipitor, fenofibrate 6. Dementia- no behavior disturbance, continue Namenda 7. Hypertension- continue lisinopril 20 by mouth daily, also added metoprolol 25 mg twice a day was blood pressure was elevated will increase the metoprolol to 50 minute grams twice a day as blood pressure continues to be elevated. 8. DVT prophylaxis- bilateral SCDs  Code Status: Full code Family Communication: *Discussed with family son and daughter at bedside Disposition Plan: To be decided   Consultants:  Orthopedics  Neurosurgery  Procedures:  None  Antibiotics:  *None  HPI/Subjective: 81 y.o. female with a past medical history of hypertension, hyperlipidemia, RLS, TIA, carotid artery stenosis, dementia, who presents with Left knee pain and head injury after fall.  Patient was walking her dog and fell onto her face today. She injured her hands, knees, face and head. She has a bruise over her face, pain over left knee joint. Patient denies fever, chills, fatigue, headaches, cough, chest pain, SOB, abdominal pain, diarrhea,  constipation, dysuria, urgency, frequency, hematuria, skin rashes, joint pain or leg swelling. No unilateral weakness, numbness or tingling sensations. No vision change or hearing loss.  in ED, patient was found to have small SDH of Rt Temporal lobe by CT scan-head, Left Patellar fx on Knee Xray, potassium level = 2.8. Patient is admitted to inpatient for further evaluation and treatment. Neurosurgery was consulted by ED.  This morning patient was drowsy but opened eyes to verbal stimuli. Family at bedside  Objective: Filed Vitals:   08/14/14 1022  BP: 176/83  Pulse: 85  Temp: 98.1 F (36.7 C)  Resp: 18   No intake or output data in the 24 hours ending 08/14/14 1301 Filed Weights   08/11/14 2216 08/12/14 0500 08/13/14 0358  Weight: 54 kg (119 lb 0.8 oz) 54 kg (119 lb 0.8 oz) 55 kg (121 lb 4.1 oz)    Exam:   General:  Appearing no acute distress  Cardiovascular: S1-S2 regular  Respiratory: Clear bilaterally  Abdomen: Soft nontender no organomegaly  Musculoskeletal: Left knee in brace   Data Reviewed: Basic Metabolic Panel:  Recent Labs Lab 08/11/14 1850 08/12/14 0155 08/12/14 0730 08/13/14 0523  NA 138 140  --  138  K 2.8* 3.2*  --  4.9  CL 102 104  --  109  CO2 29 28  --  26  GLUCOSE 121* 135*  --  114*  BUN 10 8  --  8  CREATININE 0.70 0.73  --  0.81  CALCIUM 9.4 8.5  --  8.5  MG  --   --  1.7  --    Liver Function Tests: No results for input(s): AST, ALT, ALKPHOS, BILITOT, PROT, ALBUMIN in the last 168 hours. No results for input(s): LIPASE, AMYLASE in  the last 168 hours. No results for input(s): AMMONIA in the last 168 hours. CBC:  Recent Labs Lab 08/11/14 1850 08/12/14 0155 08/13/14 0900  WBC 10.9* 9.2 8.7  NEUTROABS 8.4*  --   --   HGB 14.9 12.1 11.6*  HCT 42.1 34.4* 35.0*  MCV 86.8 86.9 93.8  PLT 274 241 214   Cardiac Enzymes: No results for input(s): CKTOTAL, CKMB, CKMBINDEX, TROPONINI in the last 168 hours. BNP (last 3 results) No  results for input(s): BNP in the last 8760 hours.  ProBNP (last 3 results) No results for input(s): PROBNP in the last 8760 hours.  CBG: No results for input(s): GLUCAP in the last 168 hours.  Recent Results (from the past 240 hour(s))  Culture, Urine     Status: None (Preliminary result)   Collection Time: 08/13/14  3:34 AM  Result Value Ref Range Status   Specimen Description URINE, RANDOM  Final   Special Requests NONE  Final   Colony Count PENDING  Incomplete   Culture   Final    Culture reincubated for better growth Performed at [facility]    Report Status PENDING  Incomplete  Culture, blood (routine x 2)     Status: None (Preliminary result)   Collection Time: 08/13/14  5:20 AM  Result Value Ref Range Status   Specimen Description BLOOD RIGHT ANTECUBITAL  Final   Special Requests BOTTLES DRAWN AEROBIC AND ANAEROBIC 5CC EA  Final   Culture   Final           BLOOD CULTURE RECEIVED NO GROWTH TO DATE CULTURE WILL BE HELD FOR 5 DAYS BEFORE ISSUING A FINAL NEGATIVE REPORT Performed at [facility]    Report Status PENDING  Incomplete  Culture, blood (routine x 2)     Status: None (Preliminary result)   Collection Time: 08/13/14  5:25 AM  Result Value Ref Range Status   Specimen Description BLOOD RIGHT HAND  Final   Special Requests BOTTLES DRAWN AEROBIC AND ANAEROBIC 5CC EA  Final   Culture   Final           BLOOD CULTURE RECEIVED NO GROWTH TO DATE CULTURE WILL BE HELD FOR 5 DAYS BEFORE ISSUING A FINAL NEGATIVE REPORT Performed at [facility]    Report Status PENDING  Incomplete     Studies: Ct Head Wo Contrast  08/12/2014   CLINICAL DATA:  Subdural hematoma.  EXAM: CT HEAD WITHOUT CONTRAST  TECHNIQUE: Contiguous axial images were obtained from the base of the skull through the vertex without intravenous contrast.  COMPARISON:  CT of the head 08/11/2014  FINDINGS: Again noted is small subdural hematoma involving the right temporal fossa, now  measuring approximately 2 mm. There is no associated mass effect or midline shift. No new hemorrhage identified. No intra or extra-axial mass. Mild central cortical atrophy. Basilar cisterns and ventricles have a normal appearance.  Bone windows show atherosclerotic calcification of the internal carotid arteries. No calvarial fracture.  IMPRESSION: 1. Smaller of persistent right temporal fossa subdural hematoma. 2. Stable minimal atrophy.   Electronically Signed   By: Norva Pavlov M.D.   On: 08/12/2014 14:14   Dg Chest Port 1 View  08/13/2014   CLINICAL DATA:  New onset fever  EXAM: PORTABLE CHEST - 1 VIEW  COMPARISON:  05/03/2013  FINDINGS: Normal cardiac silhouette. There is mild basilar atelectasis. No effusion, infiltrate, or pneumothorax. Mild blunting of the left costophrenic angle similar prior.  IMPRESSION: Mild basilar atelectasis.  No infiltrate.   Electronically Signed   By: Genevive Bi M.D.   On: 08/13/2014 08:00    Scheduled Meds: . atorvastatin  40 mg Oral q1800  . cefTRIAXone (ROCEPHIN)  IV  1 g Intravenous Q24H  . cholecalciferol  2,000 Units Oral Daily  . fenofibrate  160 mg Oral Daily  . lisinopril  20 mg Oral q morning - 10a  . memantine  28 mg Oral Daily  . metoprolol tartrate  50 mg Oral BID  . multivitamin with minerals  1 tablet Oral q morning - 10a  . sodium chloride  3 mL Intravenous Q12H  . vitamin C  500 mg Oral Daily   Continuous Infusions: . sodium chloride 75 mL/hr at 08/14/14 1610    Principal Problem:   Subdural hematoma Active Problems:   Hyperlipidemia   Hypertension   RLS (restless legs syndrome)   DJD (degenerative joint disease) of cervical spine   TIA (transient ischemic attack)   Carotid artery occlusion   Hypokalemia   Patellar fracture   SDH (subdural hematoma)   Essential hypertension    Time spent: 25 minutes    [facility]  [facility] Pager [phone]*. If 7PM-7AM, please contact night-coverage at [email or web address],  password [facility] 08/14/2014, 1:01 PM  LOS: 3 days

## 2014-08-14 NOTE — Care Management Note (Addendum)
    Page 1 of 1   08/15/2014     11:21:50 AM CARE MANAGEMENT NOTE 08/15/2014  Patient:  Heather Floyd,Heather Floyd   Account Number:  [id number]  Date Initiated:  08/14/2014  Documentation initiated by:  Elmer BalesOBARGE,Sandra Brents  Subjective/Objective Assessment:   Patient was admitted for SDH, s/p fall.  Lives at home with children     Action/Plan:   Will follow for discharge needs pending PT/OT evals and physician orders.   Anticipated DC Date:  08/15/2014   Anticipated DC Plan:  SKILLED NURSING FACILITY  In-house referral  Clinical Social Worker         Choice offered to / List presented to:             Status of service:  Completed, signed off Medicare Important Message given?  YES (If response is "NO", the following Medicare IM given date Mom will be blank) Date Medicare IM given:  08/14/2014 Medicare IM given by:  Elmer BalesOBARGE,Zenaya Ulatowski Date Additional Medicare IM given:   Additional Medicare IM given by:    Discharge Disposition:  SKILLED NURSING FACILITY  Per UR Regulation:  Reviewed for med. necessity/level of care/duration of stay  If discussed at Long Length of Stay Meetings, dates discussed:    Comments:  08/15/14 0950 Elmer Balesourtney Clif Serio RN, MSN, CM- Per conversation with CSW, patient's daughter has expressed concerns about a SNF being able to provide adequate supervision/attention for her mother.  CM provided a private duty agency list in the event that daughter should choose to provide a personal care aide or sitter while admitted to the SNF.   08/14/14 1125 Elmer Balesourtney Reno Clasby RN, MSN, CM- Medicare IM letter provided.

## 2014-08-14 NOTE — Progress Notes (Addendum)
Pt received new bledsoe brace for left leg immobilization today; pt keeps pulling padding out of the top of the brace and exposing her skin to poking metal bars as well as loosing the straps on the immobilizer.  I have redirected patient several time and reapplied padding but pt is determined.  Appling mittens to her hands in attempt to secure the integrity of the brace.  Patient has become more confused asking to call daughter at 1am; removed her mittens several times; called Dr. And administered 0.5mg  of Ativan.  Patients v/s are stable, and patient is resting comfortable.

## 2014-08-14 NOTE — Progress Notes (Signed)
Orthopedic Tech Progress Note Patient Details:  Jilda PandaGail M Golladay 09/07/1932 [id number]  Patient ID: Jilda PandaGail M Frisinger, female   DOB: 06/08/1933, 81 y.o.   MRN: [id number] Called in bio-tech brace order; spoke with Lenore Mannerebra  Meenakshi Sazama 08/14/2014, 2:08 PM

## 2014-08-14 NOTE — Progress Notes (Signed)
PT Cancellation Note  Patient Details Name: Heather PandaGail M Tomkiewicz MRN: [id number] DOB: 11/15/1932   Cancelled Treatment:    Reason Eval/Treat Not Completed: Pain limiting ability to participate   Per daughter, pt just transferred back to bed after sitting up in chair for several hours. Pt restless and trying to get comfortable in the bed; asking for pain medicine. Assisted with positioning pillows to incr comfort with no improvement. Notified RN of pt's request for pain medicine.   Trip Cavanagh 08/14/2014, 2:36 PM Pager [phone]

## 2014-08-14 NOTE — Clinical Social Work Psychosocial (Signed)
Clinical Social Work Department BRIEF PSYCHOSOCIAL ASSESSMENT 08/14/2014  Patient:  Heather Floyd, Heather Floyd     Account Number:  [id number]     Admit date:  08/11/2014  Clinical Social Worker:  Glendon Axe, CLINICAL SOCIAL WORKER  Date/Time:  08/14/2014 12:23 PM  Referred by:  Physician  Date Referred:  08/14/2014 Referred for  SNF Placement   Other Referral:   Interview type:  Other - See comment Other interview type:   CSW met with patient and pt's dtr, Heather Floyd present at bedside.    PSYCHOSOCIAL DATA Living Status:  ALONE Admitted from facility:  n/a Level of care:  n/a Primary support name:  Heather Floyd Primary support relationship to patient:  CHILD, ADULT Degree of support available:   Strong    CURRENT CONCERNS Current Concerns  Post-Acute Placement   Other Concerns:    SOCIAL WORK ASSESSMENT / PLAN Clinical Social Worker met with pt and pt's dtr, Heather Floyd in reference to post-acute placement for SNF. CSW introduced CSW role and SNF process. CSW also reviewed and provided SNF list. Pt's dtr reported pt lives at home alone and does not have full supervision daily. Pt's dtr stated she is overwhelmed with choosing a facility since she is not totally familiar with SNF process. Pt's dtr further reported pt does not wish to enter into SNF however will go for rehabilitation. CSW will continue to follow pt and pt's family for continued support and to facilitate pt's discharge needs once medically stable.   Assessment/plan status:  Psychosocial Support/Ongoing Assessment of Needs Other assessment/ plan:   CSW to place FL-2 on chart for MD signature.   Information/referral to community resources:   SNF information/list provided.    PATIENT'S/FAMILY'S RESPONSE TO PLAN OF CARE: Pt lying in bed asleep. Pt's dtr welcoming and interested in placement at [facility] which is close to her home. Pt's dtr very supportive and involved in pt's care. Pt's dtr does not want to disappoint pt by  placing her in nursing home however repeatedly stated SNF stay would be for rehab purposes only. Pt wakes up towards end of interview very agitated stating "I just want to get out of this bed!". Pt reported she is experiencing pain from her left leg and would like for her dtr to make SNF decisions. Pt and pt's dtr both agreeable to SNF placement.     Glendon Axe, MSW, LCSWA [phone] 08/14/2014 12:40 PM

## 2014-08-14 NOTE — Clinical Documentation Improvement (Signed)
  81 year old white female admitted for subdural hematoma after a fall with a known history of dementia.  Patient described as agitated per MD progress notes.  Nurses' notes described patient as trying to get out of bed, pulling off leads, pulling knee brace off, agitated and tearful, stating she wants to go home.  Patient has prn order for Xanax.  Possible Clinical Conditions:  - Dementia with a Behavior Disturbance  - Encephalopathy (including type) 2/2 ...............  - Other Condition  - Unable to Clinically Determine   Thank You, Jerral Ralphathy R Ishaan Villamar ,RN Clinical Documentation Specialist:  [phone] [facility]- Health Information Management

## 2014-08-14 NOTE — Clinical Social Work Placement (Addendum)
Clinical Social Work Department CLINICAL SOCIAL WORK PLACEMENT NOTE 08/14/2014  Patient:  Heather Floyd,Heather Floyd  Account Number:  [id number] Admit date:  08/11/2014  Clinical Social Worker:  Derenda FennelBASHIRA Larrisha Babineau, CLINICAL SOCIAL WORKER  Date/time:  08/14/2014 12:41 PM  Clinical Social Work is seeking post-discharge placement for this patient at the following level of care:   SKILLED NURSING   (*CSW will update this form in Epic as items are completed)   08/14/2014  Patient/family provided with [facility] Department of Clinical Social Work's list of facilities offering this level of care within the geographic area requested by the patient (or if unable, by the patient's family).  08/14/2014  Patient/family informed of their freedom to choose among providers that offer the needed level of care, that participate in Medicare, Medicaid or managed care program needed by the patient, have an available bed and are willing to accept the patient.  08/14/2014  Patient/family informed of [facility]' ownership interest in [facility], as well as of the fact that they are under no obligation to receive care at this facility.  PASARR submitted to EDS on 08/14/2014 PASARR number received on 08/14/2014  FL2 transmitted to all facilities in geographic area requested by pt/family on  08/14/2014 FL2 transmitted to all facilities within larger geographic area on   Patient informed that his/her managed care company has contracts with or will negotiate with  certain facilities, including the following:   YES     Patient/family informed of bed offers received:  08/14/2014 [facility] Physician recommends and patient chooses bed at    [facility]  on 08/15/2014 Patient to be transferred to facility by [facility] Patient and family notified of transfer on 08/15/2014 Name of family member notified:  Pt's dtr,  Bonita QuinLinda present at bedside.   The following physician request were entered in Epic:   Additional Comments:   Derenda FennelBashira Alycen Mack, MSW, LCSWA [phone] 08/14/2014 12:42 PM

## 2014-08-14 NOTE — Progress Notes (Signed)
Orthopedic Tech Progress Note Patient Details:  Heather Floyd 07/19/1932 [id number] Brace order completed by bio-tech vendor. Patient ID: Heather Floyd, female   DOB: 01/20/1933, 81 y.o.   MRN: [id number]   Jennye MoccasinHughes, Delynda Sepulveda Craig 08/14/2014, 3:50 PM

## 2014-08-14 NOTE — Evaluation (Signed)
Occupational Therapy Evaluation Patient Details Name: Heather Floyd MRN: [id number] DOB: [birth year] Today's Date: 08/14/2014    History of Present Illness 81 y.o. female admitted to [facility] on 08/11/14 for fall with SDH (R temporal fossa) and L Patellar fx (non-surgical, KI at all times and WBAT).  Pt with significant PMHx of HTN, RLS, DJD cervical spine, TIA, and memory disturbance.   Clinical Impression   Eval limited due to pt's level of pain reported in L LE. Nursing gave IV pain meds during session. Pt demonstrates decline in function and safety with ADLs and ADL mobility with decreased strength, balance and endurance. Pt's daughter present during session and very helpful providing information. Pt would benefit form acute OT services to address impairments to increase level of function and safety. Pt's daughter waiting to speak with CSW for short term rehab placement at SNF    Follow Up Recommendations  SNF;Supervision/Assistance - 24 hour    Equipment Recommendations  None recommended by OT;Other (comment) (TBD at next venue of care)    Recommendations for Other Services       Precautions / Restrictions Precautions Precautions: Fall Required Braces or Orthoses: Knee Immobilizer - Left Knee Immobilizer - Left: On at all times Restrictions Weight Bearing Restrictions: No LLE Weight Bearing: Weight bearing as tolerated      Mobility Bed Mobility Overal bed mobility: Needs Assistance Bed Mobility: Rolling           General bed mobility comments: mod A for rolling, pt unable to complete bed mobility to sit EOB due to pain in L LE. Per PT note pt mod A  Transfers                 General transfer comment: pt unable to transfer due  to L LE pain, could not complete bed mobility to sit EOB. Per PT note, pt mod A    Balance  unable to assess                                          ADL Overall ADL's : Needs assistance/impaired     Grooming:  Wash/dry hands;Bed level;Wash/dry face;Set up   Upper Body Bathing: Maximal assistance   Lower Body Bathing: Total assistance   Upper Body Dressing : Maximal assistance   Lower Body Dressing: Total assistance     Toilet Transfer Details (indicate cue type and reason): pt unable to transfer due  to L LE pain, could not complete bed mobility to sit EOB. Per PT note, pt mod A Toileting- Clothing Manipulation and Hygiene: Total assistance Toileting - Clothing Manipulation Details (indicate cue type and reason): using bed pan       General ADL Comments: pt with pain in L LE limiting function     Vision  reading glasses   Perception Perception Perception Tested?: No   Praxis Praxis Praxis tested?: Not tested    Pertinent Vitals/Pain Pain Assessment: 0-10 Faces Pain Scale: Hurts whole lot Pain Descriptors / Indicators: Moaning;Crying;Grimacing;Aching Pain Intervention(s): Limited activity within patient's tolerance;RN gave pain meds during session;Repositioned;Monitored during session     Hand Dominance Right   Extremity/Trunk Assessment Upper Extremity Assessment Upper Extremity Assessment: Generalized weakness   Lower Extremity Assessment Lower Extremity Assessment: Defer to PT evaluation   Cervical / Trunk Assessment Cervical / Trunk Assessment: Normal   Communication Communication Communication: No difficulties   Cognition Arousal/Alertness:  Awake/alert Behavior During Therapy: Restless;Anxious Overall Cognitive Status: History of cognitive impairments - at baseline                     General Comments   pt pleasant, anxious                 Home Living Family/patient expects to be discharged to:: Skilled nursing facility Living Arrangements: Children Available Help at Discharge: Family;Available PRN/intermittently   Home Access: Stairs to enter Entrance Stairs-Number of Steps: 2 Entrance Stairs-Rails: None Home Layout: One level;Other  (Comment)     Bathroom Shower/Tub: Producer, television/film/videoWalk-in shower   Bathroom Toilet: Standard     Home Equipment: None          Prior Functioning/Environment Level of Independence: Independent        Comments: does not drive any more, walks the family dog every day.      OT Diagnosis: Generalized weakness;Acute pain   OT Problem List: Decreased strength;Decreased knowledge of use of DME or AE   OT Treatment/Interventions: Self-care/ADL training;Therapeutic exercise;Patient/family education;Therapeutic activities;DME and/or AE instruction    OT Goals(Current goals can be found in the care plan section) Acute Rehab OT Goals Patient Stated Goal: go home (pt crying) OT Goal Formulation: With patient/family Time For Goal Achievement: 08/21/14 Potential to Achieve Goals: Good ADL Goals Pt Will Perform Grooming: with min guard assist;with set-up;with supervision;sitting (EOB) Pt Will Perform Upper Body Bathing: with mod assist;with min assist;sitting (EOB) Pt Will Perform Upper Body Dressing: sitting;with mod assist;with min assist (EOB) Pt Will Transfer to Toilet: bedside commode;with min assist Additional ADL Goal #1: Pt will complete bed mobility with min A to sit EOB for grooming and UB ADLs  OT Frequency: Min 2X/week   Barriers to D/C: Decreased caregiver support  pt lives at home alone, family planning for rehab at SNF                     End of Session    Activity Tolerance: Patient limited by pain Patient left: in bed;with call bell/phone within reach;with family/visitor present   Time: [id number] OT Time Calculation (min): 19 min Charges:  OT General Charges $OT Visit: 1 Procedure OT Evaluation $Initial OT Evaluation Tier I: 1 Procedure G-Codes:    Galen ManilaSpencer, Javyn Havlin Jeanette 08/14/2014, 1:50 PM

## 2014-08-15 LAB — URINE CULTURE: Colony Count: 50000

## 2014-08-15 MED ORDER — OXYCODONE-ACETAMINOPHEN 5-325 MG PO TABS: 1.0000 | ORAL_TABLET | ORAL | Status: DC | PRN

## 2014-08-15 MED ORDER — CEPHALEXIN 500 MG PO CAPS: 500.0000 mg | ORAL_CAPSULE | Freq: Two times a day (BID) | ORAL | Status: DC

## 2014-08-15 MED ORDER — METOPROLOL TARTRATE 50 MG PO TABS: 50.0000 mg | ORAL_TABLET | Freq: Two times a day (BID) | ORAL | Status: DC

## 2014-08-15 MED ORDER — LORAZEPAM 2 MG/ML IJ SOLN
0.5000 mg | Freq: Once | INTRAMUSCULAR | Status: AC
  Administered 2014-08-15: 0.5 mg via INTRAVENOUS
  Filled 2014-08-15: qty 1

## 2014-08-15 MED ORDER — ALPRAZOLAM 1 MG PO TABS: 1.0000 mg | ORAL_TABLET | Freq: Three times a day (TID) | ORAL | Status: DC | PRN

## 2014-08-15 NOTE — Discharge Summary (Signed)
Physician Discharge Summary  Heather Floyd ZOX:[id number] DOB: 12-09-1932 DOA: 08/11/2014  PCP: Gretel Acre, MD  Admit date: 08/11/2014 Discharge date: 08/15/2014  Time spent: 35* minutes  Recommendations for Outpatient Follow-up:  1. Follow up Ortho in 2 weeks   Discharge Diagnoses:  Principal Problem:   Subdural hematoma Active Problems:   Hyperlipidemia   Hypertension   RLS (restless legs syndrome)   DJD (degenerative joint disease) of cervical spine   TIA (transient ischemic attack)   Carotid artery occlusion   Hypokalemia   Patellar fracture   SDH (subdural hematoma)   Essential hypertension   Discharge Condition: Stable  Diet recommendation: *Low salt diet  Filed Weights   08/12/14 0500 08/13/14 0358 08/15/14 0622  Weight: 54 kg (119 lb 0.8 oz) 55 kg (121 lb 4.1 oz) 59.3 kg (130 lb 11.7 oz)    History of present illness:  81 y.o. female with a past medical history of hypertension, hyperlipidemia, RLS, TIA, carotid artery stenosis, dementia, who presents with Left knee pain and head injury after fall.  Patient was walking her dog and fell onto her face today. She injured her hands, knees, face and head. She has a bruise over her face, pain over left knee joint. Patient denies fever, chills, fatigue, headaches, cough, chest pain, SOB, abdominal pain, diarrhea, constipation, dysuria, urgency, frequency, hematuria, skin rashes, joint pain or leg swelling. No unilateral weakness, numbness or tingling sensations. No vision change or hearing loss.  in ED, patient was found to have small SDH of Rt Temporal lobe by CT scan-head, Left Patellar fx on Knee Xray, potassium level = 2.8. Patient is admitted to inpatient for further evaluation and treatment. Neurosurgery was consulted by ED.  Hospital Course:  1. Subdural hematoma- patient has small subdural hematoma no focal neurologic findings on examination. Neurosurgery was consulted yesterday and they recommended no surgery  needed. I discussed with Dr. Venetia Maxon today and he recommended to repeat CT head. No surgery recommended. Repeat head CT showed some improvement in small subdural hematoma of 2 mm today. Previous size was 3 mm. 2. Dementia with behavior disturbance-  Increased  Xanax to 1 mg every 8 hours when necessary, continue Nemenda. 3. Left patella fracture- x-ray of the left knee shows patellar fracture. Left knee in immobilizer, orthopedics recommend physical therapy weightbearing as tolerated in the left knee immobilizer. Will be discharged to SNF. Follow up orthopedics in 2 weeks 4. Hypokalemia- resolved 5. Hyperlipidemia- continue Lipitor, fenofibrate 6. Hypertension- continue lisinopril 20 by mouth daily, also added metoprolol 25 mg twice a day was blood pressure was elevated will increase the metoprolol to 50 milligrams twice a day as blood pressure continues to be elevated 7. ? UTI- Patient was started on Ceftriaxone for possible UTI. Urine culture is negative  So far, will discharge on Keflex 500 mg po BID for five more days.  Procedures:  None  Consultations:  Orthopedics  Neurosurgery  Discharge Exam: Filed Vitals:   08/15/14 0622  BP: 155/71  Pulse: 91  Temp: 98.4 F (36.9 C)  Resp: 17    General: Appear in no acute distress Cardiovascular: S1S2 RRR Respiratory: Clear bilaterally  Discharge Instructions   Discharge Instructions    Diet - low sodium heart healthy    Complete by:  As directed      Increase activity slowly    Complete by:  As directed           Current Discharge Medication List    START taking these  medications   Details  ALPRAZolam (XANAX) 1 MG tablet Take 1 tablet (1 mg total) by mouth every 8 (eight) hours as needed for anxiety. Qty: 30 tablet, Refills: 0    cephALEXin (KEFLEX) 500 MG capsule Take 1 capsule (500 mg total) by mouth 2 (two) times daily. Qty: 10 capsule, Refills: 0    metoprolol (LOPRESSOR) 50 MG tablet Take 1 tablet (50 mg total) by  mouth 2 (two) times daily.    oxyCODONE-acetaminophen (PERCOCET/ROXICET) 5-325 MG per tablet Take 1 tablet by mouth every 4 (four) hours as needed for moderate pain. Qty: 30 tablet, Refills: 0      CONTINUE these medications which have NOT CHANGED   Details  alendronate (FOSAMAX) 70 MG tablet Take 70 mg by mouth once a week. Take with a full glass of water on an empty stomach. On sundays    Cholecalciferol (VITAMIN D3) 2000 UNITS TABS Take 2,000 Units by mouth daily.    cyanocobalamin 500 MCG tablet Take 500 mcg by mouth daily as needed (for energy).     fenofibrate micronized (LOFIBRA) 200 MG capsule Take 200 mg by mouth daily before breakfast.    lisinopril (PRINIVIL,ZESTRIL) 20 MG tablet Take 20 mg by mouth every morning.     Memantine HCl ER (NAMENDA XR) 28 MG CP24 Take 28 mg by mouth daily. Qty: 30 capsule, Refills: 11    Multiple Vitamin (MULTIVITAMIN WITH MINERALS) TABS tablet Take 1 tablet by mouth every morning.    simvastatin (ZOCOR) 80 MG tablet Take 80 mg by mouth every morning.       STOP taking these medications     vitamin C (ASCORBIC ACID) 500 MG tablet        Allergies  Allergen Reactions  . Aspirin Other (See Comments)    Makes blood too thin  . Codeine Nausea Only   Follow-up Information    Follow up with Kathryne Hitch, MD. Schedule an appointment as soon as possible for a visit in 2 weeks.   Specialty:  Orthopedic Surgery   Contact information:   [address] [place] [address] [phone]        The results of significant diagnostics from this hospitalization (including imaging, microbiology, ancillary and laboratory) are listed below for reference.    Significant Diagnostic Studies: Ct Head Wo Contrast  08/12/2014   CLINICAL DATA:  Subdural hematoma.  EXAM: CT HEAD WITHOUT CONTRAST  TECHNIQUE: Contiguous axial images were obtained from the base of the skull through the vertex without intravenous contrast.  COMPARISON:  CT  of the head 08/11/2014  FINDINGS: Again noted is small subdural hematoma involving the right temporal fossa, now measuring approximately 2 mm. There is no associated mass effect or midline shift. No new hemorrhage identified. No intra or extra-axial mass. Mild central cortical atrophy. Basilar cisterns and ventricles have a normal appearance.  Bone windows show atherosclerotic calcification of the internal carotid arteries. No calvarial fracture.  IMPRESSION: 1. Smaller of persistent right temporal fossa subdural hematoma. 2. Stable minimal atrophy.   Electronically Signed   By: Norva Pavlov M.D.   On: 08/12/2014 14:14   Ct Head Wo Contrast  08/11/2014   CLINICAL DATA:  Facial injury after falling and gravel while walking dog.  EXAM: CT HEAD WITHOUT CONTRAST  CT MAXILLOFACIAL WITHOUT CONTRAST  CT CERVICAL SPINE WITHOUT CONTRAST  TECHNIQUE: Multidetector CT imaging of the head, cervical spine, and maxillofacial structures were performed using the standard protocol without intravenous contrast. Multiplanar CT image reconstructions  of the cervical spine and maxillofacial structures were also generated.  COMPARISON:  CT scan of January 13, 2013.  FINDINGS: CT HEAD FINDINGS  Bony calvarium appears intact. Mild diffuse cortical atrophy is noted. Mild chronic ischemic white matter disease is noted. No mass effect or midline shift is noted. Ventricular size is within normal limits. There is no evidence of mass lesion or acute infarction. Small subdural hematoma is noted in right temporal region and middle cranial fossa with maximum thickness of 3 mm.  CT MAXILLOFACIAL FINDINGS  No fracture or other bony abnormality is noted. Pterygoid plates appear normal. Paranasal sinuses appear normal. Globes and orbits appear normal.  CT CERVICAL SPINE FINDINGS  No fracture or spondylolisthesis is noted. Moderate degenerative disc disease is noted at C5-6 and C6-7. Mild degenerative changes seen involving the posterior facet joints.  Visualized portion of lung apices appear normal.  IMPRESSION: No significant abnormality seen in the maxillofacial region.  Moderate degenerative disc disease is noted at C5-6 and C6-7. No acute abnormality seen in the cervical spine.  Small right temporal subdural hematoma is noted without significant mass effect or midline shift. Critical Value/emergent results were called by telephone at the time of interpretation on 08/11/2014 at 6:40 pm to Dr. Vanetta MuldersSCOTT ZACKOWSKI , who verbally acknowledged these results.   Electronically Signed   By: Lupita RaiderJames  Green Jr, M.D.   On: 08/11/2014 18:41   Ct Cervical Spine Wo Contrast  08/11/2014   CLINICAL DATA:  Facial injury after falling and gravel while walking dog.  EXAM: CT HEAD WITHOUT CONTRAST  CT MAXILLOFACIAL WITHOUT CONTRAST  CT CERVICAL SPINE WITHOUT CONTRAST  TECHNIQUE: Multidetector CT imaging of the head, cervical spine, and maxillofacial structures were performed using the standard protocol without intravenous contrast. Multiplanar CT image reconstructions of the cervical spine and maxillofacial structures were also generated.  COMPARISON:  CT scan of January 13, 2013.  FINDINGS: CT HEAD FINDINGS  Bony calvarium appears intact. Mild diffuse cortical atrophy is noted. Mild chronic ischemic white matter disease is noted. No mass effect or midline shift is noted. Ventricular size is within normal limits. There is no evidence of mass lesion or acute infarction. Small subdural hematoma is noted in right temporal region and middle cranial fossa with maximum thickness of 3 mm.  CT MAXILLOFACIAL FINDINGS  No fracture or other bony abnormality is noted. Pterygoid plates appear normal. Paranasal sinuses appear normal. Globes and orbits appear normal.  CT CERVICAL SPINE FINDINGS  No fracture or spondylolisthesis is noted. Moderate degenerative disc disease is noted at C5-6 and C6-7. Mild degenerative changes seen involving the posterior facet joints. Visualized portion of lung apices  appear normal.  IMPRESSION: No significant abnormality seen in the maxillofacial region.  Moderate degenerative disc disease is noted at C5-6 and C6-7. No acute abnormality seen in the cervical spine.  Small right temporal subdural hematoma is noted without significant mass effect or midline shift. Critical Value/emergent results were called by telephone at the time of interpretation on 08/11/2014 at 6:40 pm to Dr. Vanetta MuldersSCOTT ZACKOWSKI , who verbally acknowledged these results.   Electronically Signed   By: Lupita RaiderJames  Green Jr, M.D.   On: 08/11/2014 18:41   Dg Chest Port 1 View  08/13/2014   CLINICAL DATA:  New onset fever  EXAM: PORTABLE CHEST - 1 VIEW  COMPARISON:  05/03/2013  FINDINGS: Normal cardiac silhouette. There is mild basilar atelectasis. No effusion, infiltrate, or pneumothorax. Mild blunting of the left costophrenic angle similar prior.  IMPRESSION: Mild basilar  atelectasis.  No infiltrate.   Electronically Signed   By: Genevive Bi M.D.   On: 08/13/2014 08:00   Dg Knee Complete 4 Views Left  08/11/2014   CLINICAL DATA:  Fall, left knee pain.  EXAM: LEFT KNEE - COMPLETE 4+ VIEW  COMPARISON:  MRI 09/17/2003.  FINDINGS: There is a fracture through the inferior pole of the left pigtail. Minimal displacement of the inferior fracture fragment. No additional fracture. Small joint effusion. Joint spaces are maintained.  IMPRESSION: Fracture through the inferior pole of the left patella.   Electronically Signed   By: Charlett Nose M.D.   On: 08/11/2014 18:55   Dg Knee Complete 4 Views Right  08/11/2014   CLINICAL DATA:  Fall onto left knee today with injury. Difficulty moving the knee.  EXAM: RIGHT KNEE - COMPLETE 4+ VIEW  COMPARISON:  None.  FINDINGS: Bony demineralization. Prepatellar soft tissue swelling. Indistinct patellar tendon without overt patella alta. Chronic appearing spurring along the quadriceps attachment site to the patella. No overt knee effusion.  IMPRESSION: 1. Prepatellar edema or  bursitis. Indistinct patellar tendon, but without patella alta to suggest rupture of the patellar tendon. Correlate with any patellar laxity at physical exam. 2. Bony demineralization.   Electronically Signed   By: Gaylyn Rong M.D.   On: 08/11/2014 18:56   Dg Hips Bilat With Pelvis 2v  08/11/2014   CLINICAL DATA:  Larey Seat outside today. Left knee pain. Difficulty moving.  EXAM: BILATERAL HIP (WITH PELVIS) 2 VIEWS  COMPARISON:  Pelvis 03/19/2012  FINDINGS: Pelvis and both hips appear intact. No acute fracture or dislocation identified. No focal bone lesion or bone destruction. SI joints and symphysis pubis are not displaced. Vascular calcifications are present. Degenerative changes in the lower lumbar spine.  IMPRESSION: No acute fracture or dislocation demonstrated in the pelvis or hips.   Electronically Signed   By: Burman Nieves M.D.   On: 08/11/2014 18:58   Ct Maxillofacial Wo Cm  08/11/2014   CLINICAL DATA:  Facial injury after falling and gravel while walking dog.  EXAM: CT HEAD WITHOUT CONTRAST  CT MAXILLOFACIAL WITHOUT CONTRAST  CT CERVICAL SPINE WITHOUT CONTRAST  TECHNIQUE: Multidetector CT imaging of the head, cervical spine, and maxillofacial structures were performed using the standard protocol without intravenous contrast. Multiplanar CT image reconstructions of the cervical spine and maxillofacial structures were also generated.  COMPARISON:  CT scan of January 13, 2013.  FINDINGS: CT HEAD FINDINGS  Bony calvarium appears intact. Mild diffuse cortical atrophy is noted. Mild chronic ischemic white matter disease is noted. No mass effect or midline shift is noted. Ventricular size is within normal limits. There is no evidence of mass lesion or acute infarction. Small subdural hematoma is noted in right temporal region and middle cranial fossa with maximum thickness of 3 mm.  CT MAXILLOFACIAL FINDINGS  No fracture or other bony abnormality is noted. Pterygoid plates appear normal. Paranasal  sinuses appear normal. Globes and orbits appear normal.  CT CERVICAL SPINE FINDINGS  No fracture or spondylolisthesis is noted. Moderate degenerative disc disease is noted at C5-6 and C6-7. Mild degenerative changes seen involving the posterior facet joints. Visualized portion of lung apices appear normal.  IMPRESSION: No significant abnormality seen in the maxillofacial region.  Moderate degenerative disc disease is noted at C5-6 and C6-7. No acute abnormality seen in the cervical spine.  Small right temporal subdural hematoma is noted without significant mass effect or midline shift. Critical Value/emergent results were called by telephone at  the time of interpretation on 08/11/2014 at 6:40 pm to Dr. Vanetta Mulders , who verbally acknowledged these results.   Electronically Signed   By: Lupita Raider, M.D.   On: 08/11/2014 18:41    Microbiology: Recent Results (from the past 240 hour(s))  Culture, Urine     Status: None (Preliminary result)   Collection Time: 08/13/14  3:34 AM  Result Value Ref Range Status   Specimen Description URINE, RANDOM  Final   Special Requests NONE  Final   Colony Count PENDING  Incomplete   Culture   Final    Culture reincubated for better growth Performed at [facility]    Report Status PENDING  Incomplete  Culture, blood (routine x 2)     Status: None (Preliminary result)   Collection Time: 08/13/14  5:20 AM  Result Value Ref Range Status   Specimen Description BLOOD RIGHT ANTECUBITAL  Final   Special Requests BOTTLES DRAWN AEROBIC AND ANAEROBIC 5CC EA  Final   Culture   Final           BLOOD CULTURE RECEIVED NO GROWTH TO DATE CULTURE WILL BE HELD FOR 5 DAYS BEFORE ISSUING A FINAL NEGATIVE REPORT Performed at [facility]    Report Status PENDING  Incomplete  Culture, blood (routine x 2)     Status: None (Preliminary result)   Collection Time: 08/13/14  5:25 AM  Result Value Ref Range Status   Specimen Description BLOOD RIGHT HAND  Final    Special Requests BOTTLES DRAWN AEROBIC AND ANAEROBIC 5CC EA  Final   Culture   Final           BLOOD CULTURE RECEIVED NO GROWTH TO DATE CULTURE WILL BE HELD FOR 5 DAYS BEFORE ISSUING A FINAL NEGATIVE REPORT Performed at [facility]    Report Status PENDING  Incomplete     Labs: Basic Metabolic Panel:  Recent Labs Lab 08/11/14 1850 08/12/14 0155 08/12/14 0730 08/13/14 0523  NA 138 140  --  138  K 2.8* 3.2*  --  4.9  CL 102 104  --  109  CO2 29 28  --  26  GLUCOSE 121* 135*  --  114*  BUN 10 8  --  8  CREATININE 0.70 0.73  --  0.81  CALCIUM 9.4 8.5  --  8.5  MG  --   --  1.7  --    Liver Function Tests: No results for input(s): AST, ALT, ALKPHOS, BILITOT, PROT, ALBUMIN in the last 168 hours. No results for input(s): LIPASE, AMYLASE in the last 168 hours. No results for input(s): AMMONIA in the last 168 hours. CBC:  Recent Labs Lab 08/11/14 1850 08/12/14 0155 08/13/14 0900  WBC 10.9* 9.2 8.7  NEUTROABS 8.4*  --   --   HGB 14.9 12.1 11.6*  HCT 42.1 34.4* 35.0*  MCV 86.8 86.9 93.8  PLT 274 241 214   Cardiac Enzymes: No results for input(s): CKTOTAL, CKMB, CKMBINDEX, TROPONINI in the last 168 hours. BNP: BNP (last 3 results) No results for input(s): BNP in the last 8760 hours.  ProBNP (last 3 results) No results for input(s): PROBNP in the last 8760 hours.  CBG: No results for input(s): GLUCAP in the last 168 hours.     SignedMauro Kaufmann S  [facility] 08/15/2014, 9:50 AM

## 2014-08-15 NOTE — Clinical Social Work Note (Signed)
Clinical Social Worker facilitated patient discharge including contacting patient family and facility to confirm patient discharge plans.  Clinical information faxed to facility and family agreeable with plan.  CSW arranged [facility].  RN to call report prior to discharge.  RNCM to provide pt's dtr, Bonita QuinLinda with a list of [facility].   DC packet placed on chart for transport.   Clinical Social Worker will sign off for now as social work intervention is no longer needed. Please consult us again if new need arises.  Derenda FennelBashira Christain Mcraney, MSW, LCSWA [phone] 08/15/2014 10:14 AM

## 2014-08-15 NOTE — Progress Notes (Signed)
PT Cancellation Note  Patient Details Name: Heather Floyd MRN: [id number] DOB: 07/25/1932   Cancelled Treatment:    Reason Eval/Treat Not Completed: Medical issues which prohibited therapy. Patient having some agitation and confusion earlier this AM. Patient had just gotten back to bed and comfortable after using BSC. Patient and daughter stating that the pain was too much for her to get back up at this time. Will reattempt later as time and schedule will allow.    Fredrich BirksRobinette, Julia Elizabeth 08/15/2014, 8:46 AM 08/15/2014 Fredrich Birksobinette, Julia Elizabeth PTA [phone] pager [phone] office

## 2014-08-15 NOTE — Progress Notes (Signed)
Discharge orders received, [facility].  IV D/C.  D/C instructions and Rx in packet for receiving facility and report called to SNF.  Family at the bedside to assist with discharge. [facility] brought pt downstairs via stretcher.

## 2014-08-16 ENCOUNTER — Non-Acute Institutional Stay (SKILLED_NURSING_FACILITY): Payer: Medicare Other | Admitting: Internal Medicine

## 2014-08-16 ENCOUNTER — Encounter: Payer: Self-pay | Admitting: Internal Medicine

## 2014-08-16 DIAGNOSIS — E785 Hyperlipidemia, unspecified: Secondary | ICD-10-CM | POA: Diagnosis not present

## 2014-08-16 DIAGNOSIS — M810 Age-related osteoporosis without current pathological fracture: Secondary | ICD-10-CM

## 2014-08-16 DIAGNOSIS — S065XAA Traumatic subdural hemorrhage with loss of consciousness status unknown, initial encounter: Secondary | ICD-10-CM

## 2014-08-16 DIAGNOSIS — I6200 Nontraumatic subdural hemorrhage, unspecified: Secondary | ICD-10-CM | POA: Diagnosis not present

## 2014-08-16 DIAGNOSIS — F411 Generalized anxiety disorder: Secondary | ICD-10-CM | POA: Diagnosis not present

## 2014-08-16 DIAGNOSIS — F329 Major depressive disorder, single episode, unspecified: Secondary | ICD-10-CM | POA: Diagnosis not present

## 2014-08-16 DIAGNOSIS — S82002S Unspecified fracture of left patella, sequela: Secondary | ICD-10-CM | POA: Diagnosis not present

## 2014-08-16 DIAGNOSIS — S065X9A Traumatic subdural hemorrhage with loss of consciousness of unspecified duration, initial encounter: Secondary | ICD-10-CM

## 2014-08-16 DIAGNOSIS — I10 Essential (primary) hypertension: Secondary | ICD-10-CM

## 2014-08-16 DIAGNOSIS — F32A Depression, unspecified: Secondary | ICD-10-CM

## 2014-08-16 DIAGNOSIS — N390 Urinary tract infection, site not specified: Secondary | ICD-10-CM

## 2014-08-16 NOTE — Progress Notes (Signed)
Patient ID: Heather Floyd, female   DOB: 03/20/1933, 81 y.o.   MRN: [id number]     [facility]   PCP: NNODI, Alesia RichardsADAKU, MD  Code Status: full code  Allergies  Allergen Reactions  . Aspirin Other (See Comments)    Makes blood too thin  . Codeine Nausea Only    Chief Complaint  Patient presents with  . New Admit To SNF     HPI:  81 year old patient is here for short term rehabilitation post hospital admission from 08/11/14-08/15/14 with left knee pain post fall. patient was found to have small subdural hematoma on right temporal lobe on CT scan of head. She also had left Patellar fracture and hypokalemia. Neurosurgery was consulted. On repeat ct head, some resolution was noted, no surgery recommended. Orthopedic was consulted and recommended medical management. She had immobilizer placed and is WBAT. She was also treated for uti and HTN in hospital. She has past medical history of hypertension, hyperlipidemia, RLS, TIA, carotid artery stenosis, dementia. She is seen in her room today. She is pleasantly confused and denies any concerns. Mentions that her pain is under control and movement brings it along. Her appetite is good.   Review of Systems:  Constitutional: Negative for fever, chills, diaphoresis.  HENT: Negative for headache, congestion, nasal discharge Eyes: Negative for eye pain, blurred vision, double vision and discharge.  Respiratory: Negative for cough, shortness of breath and wheezing.   Cardiovascular: Negative for chest pain, palpitations, leg swelling.  Gastrointestinal: Negative for heartburn, nausea, vomiting, abdominal pain.  Genitourinary: Negative for dysuria Musculoskeletal: Negative for back pain. Has high fall risk Skin: Negative for itching, rash.  Neurological: positive for weakness. Negative for dizziness, tingling, focal weakness Psychiatric/Behavioral: Negative for depression. Positive for memory loss.    Past Medical  History  Diagnosis Date  . Hyperlipidemia   . Hypertension   . RLS (restless legs syndrome)   . Sleep disorder   . Personal history of colonic polyps   . Osteoporosis   . DJD (degenerative joint disease) of cervical spine   . Varicose veins   . TIA (transient ischemic attack)   . Carotid artery occlusion   . Memory disturbance 03/29/2013   Past Surgical History  Procedure Laterality Date  . Anterior and posterior vaginal repair w/ sacrospinous ligament suspension    . Endovenous ablation saphenous vein w/ laser    . Breast reduction surgery    . Cesarean section    . Hemorrhoid surgery     Social History:   reports that she quit smoking about 34 years ago. She has never used smokeless tobacco. She reports that she does not drink alcohol or use illicit drugs.  Family History  Problem Relation Age of Onset  . Mental illness Mother   . Alzheimer's disease Mother   . Cancer Father   . Bone cancer Father   . Dementia Sister     Medications: Patient's Medications  New Prescriptions   No medications on file  Previous Medications   ALENDRONATE (FOSAMAX) 70 MG TABLET    Take 70 mg by mouth once a week. Take with a full glass of water on an empty stomach. On sundays   ALPRAZOLAM (XANAX) 1 MG TABLET    Take 1 tablet (1 mg total) by mouth every 8 (eight) hours as needed for anxiety.   CEPHALEXIN (KEFLEX) 500 MG CAPSULE    Take 1 capsule (500 mg total) by mouth 2 (two) times  daily.   CHOLECALCIFEROL (VITAMIN D3) 2000 UNITS TABS    Take 2,000 Units by mouth daily.   CYANOCOBALAMIN 500 MCG TABLET    Take 500 mcg by mouth daily as needed (for energy).    FENOFIBRATE MICRONIZED (LOFIBRA) 200 MG CAPSULE    Take 200 mg by mouth daily before breakfast.   LISINOPRIL (PRINIVIL,ZESTRIL) 20 MG TABLET    Take 20 mg by mouth every morning.    MEMANTINE HCL ER (NAMENDA XR) 28 MG CP24    Take 28 mg by mouth daily.   METOPROLOL (LOPRESSOR) 50 MG TABLET    Take 1 tablet (50 mg total) by mouth 2  (two) times daily.   MULTIPLE VITAMIN (MULTIVITAMIN WITH MINERALS) TABS TABLET    Take 1 tablet by mouth every morning.   OXYCODONE-ACETAMINOPHEN (PERCOCET/ROXICET) 5-325 MG PER TABLET    Take 1 tablet by mouth every 4 (four) hours as needed for moderate pain.   SIMVASTATIN (ZOCOR) 80 MG TABLET    Take 80 mg by mouth every morning.   Modified Medications   No medications on file  Discontinued Medications   No medications on file     Physical Exam: Filed Vitals:   08/16/14 1623  BP: 136/71  Pulse: 83  Temp: 100 F (37.8 C)  Resp: 18  Weight: 130 lb (58.968 kg)  SpO2: 91%    General- elderly female, frail, in no acute distress Head- normocephalic, atraumatic Throat- moist mucus membrane Neck- no cervical lymphadenopathy Cardiovascular- normal s1,s2, no murmurs, palpable dorsalis pedis, left leg trace edema Respiratory- bilateral clear to auscultation, no wheeze, no rhonchi, no crackles, no use of accessory muscles Abdomen- bowel sounds present, soft, non tender Musculoskeletal- able to move all 4 extremities, limited rom in left leg,  Neurological- no focal deficit, pleasantly confused Skin- warm and dry Psychiatry- alert and oriented to person, place and time, normal mood and affect    Labs reviewed: Basic Metabolic Panel:  Recent Labs  40/98/11 1850 08/12/14 0155 08/12/14 0730 08/13/14 0523  NA 138 140  --  138  K 2.8* 3.2*  --  4.9  CL 102 104  --  109  CO2 29 28  --  26  GLUCOSE 121* 135*  --  114*  BUN 10 8  --  8  CREATININE 0.70 0.73  --  0.81  CALCIUM 9.4 8.5  --  8.5  MG  --   --  1.7  --    Liver Function Tests: No results for input(s): AST, ALT, ALKPHOS, BILITOT, PROT, ALBUMIN in the last 8760 hours. No results for input(s): LIPASE, AMYLASE in the last 8760 hours. No results for input(s): AMMONIA in the last 8760 hours. CBC:  Recent Labs  08/11/14 1850 08/12/14 0155 08/13/14 0900  WBC 10.9* 9.2 8.7  NEUTROABS 8.4*  --   --   HGB 14.9 12.1  11.6*  HCT 42.1 34.4* 35.0*  MCV 86.8 86.9 93.8  PLT 274 241 214    Assessment/Plan  Left patellar fracture S/p immobilizer in place, to work with therapy team as tolerated, fall precautions. Continue percocet 5/325 a4h prn pain and is WBAT. Not on anticoagulation with her hx of fall and subdural hematoma. Pressure ulcer prophylaxis  Right subdural hematoma Monitor clinically, fall precautions.  UTI Complete course of keflex until 08/19/14. Encouraged hydration  HTN Stable bp reading, continue lopressor 50 mg bid, lisinopril 20 mg dailt  Osteoporosis On weekly fosamax, fall precuations, vit d supplement  Hyperlipidemia Continue simvastatin 80  mg daily  Anxiety Continue xanax 1 mg q8h prn anxiety  Dementia Continue namenda xr 28 mg daily   Goals of care: short term rehabilitation, goal is for her to return home but with her dementia and fall risk, she will need supervision at home  Labs- cbc , cmp in 1 week  Family/ staff Communication: reviewed care plan with patient and nursing supervisor    Oneal Grout, MD  [facility] [phone] (Monday-Friday 8 am - 5 pm) [phone] (afterhours)

## 2014-08-19 LAB — CULTURE, BLOOD (ROUTINE X 2)
CULTURE: NO GROWTH
Culture: NO GROWTH

## 2014-08-21 ENCOUNTER — Other Ambulatory Visit: Payer: Self-pay | Admitting: *Deleted

## 2014-08-21 MED ORDER — TRAMADOL HCL 50 MG PO TABS: ORAL_TABLET | ORAL | Status: DC

## 2014-08-21 MED ORDER — TRAMADOL HCL 50 MG PO TABS: 50.0000 mg | ORAL_TABLET | Freq: Three times a day (TID) | ORAL | Status: DC | PRN

## 2014-08-21 NOTE — Telephone Encounter (Signed)
[facility] 

## 2014-08-24 ENCOUNTER — Non-Acute Institutional Stay (SKILLED_NURSING_FACILITY): Payer: Medicare Other | Admitting: Adult Health

## 2014-08-24 DIAGNOSIS — I10 Essential (primary) hypertension: Secondary | ICD-10-CM

## 2014-08-24 DIAGNOSIS — M810 Age-related osteoporosis without current pathological fracture: Secondary | ICD-10-CM | POA: Diagnosis not present

## 2014-08-24 DIAGNOSIS — S82002S Unspecified fracture of left patella, sequela: Secondary | ICD-10-CM

## 2014-08-24 DIAGNOSIS — F0391 Unspecified dementia with behavioral disturbance: Secondary | ICD-10-CM

## 2014-08-24 DIAGNOSIS — E785 Hyperlipidemia, unspecified: Secondary | ICD-10-CM | POA: Diagnosis not present

## 2014-08-24 DIAGNOSIS — S065XAA Traumatic subdural hemorrhage with loss of consciousness status unknown, initial encounter: Secondary | ICD-10-CM

## 2014-08-24 DIAGNOSIS — F419 Anxiety disorder, unspecified: Secondary | ICD-10-CM

## 2014-08-24 DIAGNOSIS — I6200 Nontraumatic subdural hemorrhage, unspecified: Secondary | ICD-10-CM

## 2014-08-24 DIAGNOSIS — S065X9A Traumatic subdural hemorrhage with loss of consciousness of unspecified duration, initial encounter: Secondary | ICD-10-CM

## 2014-08-29 ENCOUNTER — Telehealth: Payer: Self-pay | Admitting: Neurology

## 2014-08-29 NOTE — Telephone Encounter (Signed)
Pt's daughter is calling stating patient had a bad fall 2/26 and was in hospital and busted her knee.  She is now home but her health has declined. She did hit her head when she fell and daughter thinks her dementia has gotten worse and pt is depressed.  She feels pt needs to be seen by Dr. Anne HahnWillis very soon, she does not want her to see the NP.  Please call and advise.

## 2014-08-29 NOTE — Telephone Encounter (Signed)
Spoke to patient's daughter and she relayed the patient has been up during the night, returned from rehab and did not realize she was home.  She is asking if her Namenda needs to be increased, and possibly something to help her rest.  I did not make a follow up appointment yet.

## 2014-08-29 NOTE — Telephone Encounter (Signed)
I called patient, talk with the daughter. The patient fell around February 26, sustained a head injury with a small subdural hematoma. The patient has had increased confusion following the fall. The patient remains on Namenda, she could not tolerate Aricept previously. We will try get a revisit set up for her, may consider other medications such as Remeron or Zoloft in the evening hours.

## 2014-08-30 NOTE — Telephone Encounter (Signed)
Spoke to patient's daughter and appointment scheduled for 09-08-14 at 12 noon.

## 2014-09-08 ENCOUNTER — Ambulatory Visit (INDEPENDENT_AMBULATORY_CARE_PROVIDER_SITE_OTHER): Payer: Medicare Other | Admitting: Neurology

## 2014-09-08 ENCOUNTER — Encounter: Payer: Self-pay | Admitting: Neurology

## 2014-09-08 VITALS — BP 156/91 | HR 98 | Ht 62.0 in | Wt 150.0 lb

## 2014-09-08 DIAGNOSIS — R413 Other amnesia: Secondary | ICD-10-CM | POA: Diagnosis not present

## 2014-09-08 DIAGNOSIS — S060X9A Concussion with loss of consciousness of unspecified duration, initial encounter: Secondary | ICD-10-CM

## 2014-09-08 DIAGNOSIS — S060X1D Concussion with loss of consciousness of 30 minutes or less, subsequent encounter: Secondary | ICD-10-CM | POA: Diagnosis not present

## 2014-09-08 DIAGNOSIS — R269 Unspecified abnormalities of gait and mobility: Secondary | ICD-10-CM | POA: Diagnosis not present

## 2014-09-08 HISTORY — DX: Unspecified abnormalities of gait and mobility: R26.9

## 2014-09-08 HISTORY — DX: Concussion with loss of consciousness of unspecified duration, initial encounter: S06.0X9A

## 2014-09-08 MED ORDER — SERTRALINE HCL 25 MG PO TABS: ORAL_TABLET | ORAL | Status: DC

## 2014-09-08 MED ORDER — ALPRAZOLAM 1 MG PO TABS: 1.0000 mg | ORAL_TABLET | Freq: Three times a day (TID) | ORAL | Status: DC | PRN

## 2014-09-08 NOTE — Patient Instructions (Signed)

## 2014-09-08 NOTE — Progress Notes (Signed)
Reason for visit: Memory disorder  Heather Floyd is an 81 y.o. female  History of present illness:  Heather Floyd is a 81 year old right-handed white female with a history of a progressive memory disorder. The patient has come back to the office today following a significant fall that occurred on 08/10/2014. The patient struck her head, and she sustained a concussion with a small subdural hematoma noted in the medial parietal area. The patient currently is in an extended care facility. She has had some worsening cognitive functioning following the fall. The patient is having more problems with confusion, particularly with sundowning at night. She is having agitation issues. She is sleeping well when she does get to sleep, but she has been given 1 mg tablets of Xanax to use at night and sometimes during the day as well for agitation. The patient has been having some falls prior to the concussion. She reports dizziness in the morning at times. The patient returns to this office for reevaluation.  Past Medical History  Diagnosis Date  . Hyperlipidemia   . Hypertension   . RLS (restless legs syndrome)   . Sleep disorder   . Personal history of colonic polyps   . Osteoporosis   . DJD (degenerative joint disease) of cervical spine   . Varicose veins   . TIA (transient ischemic attack)   . Carotid artery occlusion   . Memory disturbance 03/29/2013  . Abnormality of gait 09/08/2014  . Concussion with loss of consciousness 09/08/2014    Past Surgical History  Procedure Laterality Date  . Anterior and posterior vaginal repair w/ sacrospinous ligament suspension    . Endovenous ablation saphenous vein w/ laser    . Breast reduction surgery    . Cesarean section    . Hemorrhoid surgery      Family History  Problem Relation Age of Onset  . Mental illness Mother   . Alzheimer's disease Mother   . Cancer Father   . Bone cancer Father   . Dementia Sister     Social history:  reports that  she quit smoking about 34 years ago. She has never used smokeless tobacco. She reports that she does not drink alcohol or use illicit drugs.    Allergies  Allergen Reactions  . Aspirin Other (See Comments)    Makes blood too thin  . Codeine Nausea Only    Medications:  Prior to Admission medications   Medication Sig Start Date End Date Taking? Authorizing Provider  alendronate (FOSAMAX) 70 MG tablet Take 70 mg by mouth once a week. Take with a full glass of water on an empty stomach. On sundays   Yes Historical Provider, MD  ALPRAZolam [name](XANAX) 1 MG tablet Take 1 tablet (1 mg total) by mouth every 8 (eight) hours as needed for anxiety. 08/15/14  Yes Meredeth IdeGagan S Lama, MD  Cholecalciferol (VITAMIN D3) 2000 UNITS TABS Take 2,000 Units by mouth daily.   Yes Historical Provider, MD  fenofibrate micronized (LOFIBRA) 200 MG capsule Take 200 mg by mouth daily before breakfast.   Yes Historical Provider, MD  lisinopril (PRINIVIL,ZESTRIL) 20 MG tablet Take 20 mg by mouth every morning.    Yes Historical Provider, MD  Memantine HCl ER (NAMENDA XR) 28 MG CP24 Take 28 mg by mouth daily. 05/29/14  Yes York Spanielharles K Jese Comella, MD  Multiple Vitamin (MULTIVITAMIN WITH MINERALS) TABS tablet Take 1 tablet by mouth every morning.   Yes Historical Provider, MD  simvastatin (ZOCOR) 40 MG tablet  Take 40 mg by mouth daily.   Yes Historical Provider, MD  traMADol (ULTRAM) 50 MG tablet Take one tablet by mouth every 6 hours as needed for mild pain; Take two tablets by mouth every 6 hours as needed for severe pain. 08/21/14  Yes Tiffany L Reed, DO    ROS:  Out of a complete 14 system review of symptoms, the patient complains only of the following symptoms, and all other reviewed systems are negative.  Activity change, appetite change Light sensitivity  Memory loss, dizziness   Blood pressure 156/91, pulse 98, height  (1.575 m), weight 150 lb (68.04 kg).  Physical Exam  General: The patient is alert and cooperative at  the time of the examination.  Neuromuscular: The patient has a stabilizer on the left knee.  Skin: No significant peripheral edema is noted.   Neurologic Exam  Mental status: The Mini-Mental Status Examination done today shows a total score of 20/30. Animal fluency test score is 3.   Cranial nerves: Facial symmetry is present. Speech is normal, no aphasia or dysarthria is noted. Extraocular movements are full. Visual [place] are full.  Motor: The patient has good strength in all 4 extremities.  Sensory examination: Soft touch sensation is symmetric on the face, arms, and legs.  Coordination: The patient has good finger-nose-finger and heel-to-shin bilaterally, With exception that the patient cannot perform heel-to-shin with the left leg secondary to the stabilizer.  Gait and station: The patient has a limping type gait on the left leg. The patient is able to ambulate independently. Romberg is negative.  Reflexes: Deep tendon reflexes are symmetric.   CT head 08/12/14:  IMPRESSION: 1. Smaller of persistent right temporal fossa subdural hematoma. 2. Stable minimal atrophy.  * CT scan images were reviewed online. I agree with the written report.    Assessment/Plan:  1. Memory disturbance  2. Gait disturbance  3. Concussion  The patient has sustained a recent fall, she has had some increased problems with agitation and worsening cognitive functioning following this. The patient likely sustained a concussion. The patient will be placed on low-dose Zoloft going up to 50 mg daily. I indicated that she is not to operate a motor vehicle at this point. She will be maintained on Namenda, she could not tolerate Aricept previously. She will follow-up in August 2016.   [name] MD 09/08/2014 5:15 PM  [facility] [address] [place], [place] [address]  Phone [phone] Fax [phone]

## 2014-09-11 ENCOUNTER — Telehealth: Payer: Self-pay

## 2014-09-11 NOTE — Telephone Encounter (Signed)
Left voicemail that letter for Heather Floyd is ready to be picked up at the front desk.

## 2014-09-21 ENCOUNTER — Encounter: Payer: Self-pay | Admitting: Adult Health

## 2014-09-21 NOTE — Progress Notes (Signed)
Patient ID: Heather Floyd, female   DOB: 07/31/1932, 81 y.o.   MRN: [id number]   08/24/14  Facility:  Nursing Home Location:  [facility] Room Number: 602-P LEVEL OF CARE:  SNF (31)   Chief Complaint  Patient presents with  . Discharge Note    Left patellar fracture, subdural hematoma, dementia, anxiety, hyperlipidemia, hypertension and osteoporosis    HISTORY OF PRESENT ILLNESS:  This is a 81 year old female who is for discharge home with home health PT for endurance, OT for ADLs, CNA for showers, ST for swallowing observation and nurse for medication management. DME: Wheelchair with extended leg rests and 3 in one bedside commode. She has been admitted to [facility] on 08/15/14 from [facility]. She has PMH of hypertension, hyperlipidemia, restless leg syndrome, TIA, carotid artery stenosis and dementia. She was walking her dog and fell onto her face. She sustained a left patellar fracture and CT showed a right temporal lobe SDH. She was also noted to have K2.8. Neurosurgery was consulted. SDH was 3 mm before and a repeat CT showed 2 mm.  Patient was admitted to this facility for short-term rehabilitation after the patient's recent hospitalization.  Patient has completed SNF rehabilitation and therapy has cleared the patient for discharge.   PAST MEDICAL HISTORY:  Past Medical History  Diagnosis Date  . Hyperlipidemia   . Hypertension   . RLS (restless legs syndrome)   . Sleep disorder   . Personal history of colonic polyps   . Osteoporosis   . DJD (degenerative joint disease) of cervical spine   . Varicose veins   . TIA (transient ischemic attack)   . Carotid artery occlusion   . Memory disturbance 03/29/2013  . Abnormality of gait 09/08/2014  . Concussion with loss of consciousness 09/08/2014    CURRENT MEDICATIONS: Reviewed per MAR/see medication list  Allergies  Allergen Reactions  . Aspirin Other (See Comments)    Makes blood too  thin  . Codeine Nausea Only     REVIEW OF SYSTEMS:  GENERAL: no change in appetite, no fatigue, no weight changes, no fever, chills or weakness RESPIRATORY: no cough, SOB, DOE, wheezing, hemoptysis CARDIAC: no chest pain, edema or palpitations GI: no abdominal pain, diarrhea, constipation, heart burn, nausea or vomiting  PHYSICAL EXAMINATION  GENERAL: no acute distress, normal body habitus EYES: conjunctivae normal, sclerae normal, normal eye lids NECK: supple, trachea midline, no neck masses, no thyroid tenderness, no thyromegaly LYMPHATICS: no LAN in the neck, no supraclavicular LAN RESPIRATORY: breathing is even & unlabored, BS CTAB CARDIAC: RRR, no murmur,no extra heart sounds, no edema GI: abdomen soft, normal BS, no masses, no tenderness, no hepatomegaly, no splenomegaly EXTREMITIES: Able to move 4 extremities; has left bledsoe brace PSYCHIATRIC: the patient is alert & oriented to person, affect & behavior appropriate  LABS/RADIOLOGY:  08/23/14  WBC 6.9 hemoglobin 13.1 hematocrit 39.7 MCV 91.9 sodium 141 potassium 4.0 glucose 97 BUN 10 creatinine 0.88 total bilirubin 0.5 alkaline phosphatase 56 SGOT 16 SGPT 8 total protein 6.1 albumin 3.9 calcium 8.8 Labs reviewed: Basic Metabolic Panel:  Recent Labs  40/98/11 1850 08/12/14 0155 08/12/14 0730 08/13/14 0523  NA 138 140  --  138  K 2.8* 3.2*  --  4.9  CL 102 104  --  109  CO2 29 28  --  26  GLUCOSE 121* 135*  --  114*  BUN 10 8  --  8  CREATININE 0.70 0.73  --  0.81  CALCIUM 9.4 8.5  --  8.5  MG  --   --  1.7  --    CBC:  Recent Labs  08/11/14 1850 08/12/14 0155 08/13/14 0900  WBC 10.9* 9.2 8.7  NEUTROABS 8.4*  --   --   HGB 14.9 12.1 11.6*  HCT 42.1 34.4* 35.0*  MCV 86.8 86.9 93.8  PLT 274 241 214   Ct Head Wo Contrast  08/12/2014   CLINICAL DATA:  Subdural hematoma.  EXAM: CT HEAD WITHOUT CONTRAST  TECHNIQUE: Contiguous axial images were obtained from the base of the skull through the vertex without  intravenous contrast.  COMPARISON:  CT of the head 08/11/2014  FINDINGS: Again noted is small subdural hematoma involving the right temporal fossa, now measuring approximately 2 mm. There is no associated mass effect or midline shift. No new hemorrhage identified. No intra or extra-axial mass. Mild central cortical atrophy. Basilar cisterns and ventricles have a normal appearance.  Bone windows show atherosclerotic calcification of the internal carotid arteries. No calvarial fracture.  IMPRESSION: 1. Smaller of persistent right temporal fossa subdural hematoma. 2. Stable minimal atrophy.   Electronically Signed   By: Norva PavlovElizabeth  Brown M.D.   On: 08/12/2014 14:14   Ct Head Wo Contrast  08/11/2014   CLINICAL DATA:  Facial injury after falling and gravel while walking dog.  EXAM: CT HEAD WITHOUT CONTRAST  CT MAXILLOFACIAL WITHOUT CONTRAST  CT CERVICAL SPINE WITHOUT CONTRAST  TECHNIQUE: Multidetector CT imaging of the head, cervical spine, and maxillofacial structures were performed using the standard protocol without intravenous contrast. Multiplanar CT image reconstructions of the cervical spine and maxillofacial structures were also generated.  COMPARISON:  CT scan of January 13, 2013.  FINDINGS: CT HEAD FINDINGS  Bony calvarium appears intact. Mild diffuse cortical atrophy is noted. Mild chronic ischemic white matter disease is noted. No mass effect or midline shift is noted. Ventricular size is within normal limits. There is no evidence of mass lesion or acute infarction. Small subdural hematoma is noted in right temporal region and middle cranial fossa with maximum thickness of 3 mm.  CT MAXILLOFACIAL FINDINGS  No fracture or other bony abnormality is noted. Pterygoid plates appear normal. Paranasal sinuses appear normal. Globes and orbits appear normal.  CT CERVICAL SPINE FINDINGS  No fracture or spondylolisthesis is noted. Moderate degenerative disc disease is noted at C5-6 and C6-7. Mild degenerative changes  seen involving the posterior facet joints. Visualized portion of lung apices appear normal.  IMPRESSION: No significant abnormality seen in the maxillofacial region.  Moderate degenerative disc disease is noted at C5-6 and C6-7. No acute abnormality seen in the cervical spine.  Small right temporal subdural hematoma is noted without significant mass effect or midline shift. Critical Value/emergent results were called by telephone at the time of interpretation on 08/11/2014 at 6:40 pm to Dr. Vanetta MuldersSCOTT ZACKOWSKI , who verbally acknowledged these results.   Electronically Signed   By: Lupita RaiderJames  Green Jr, M.D.   On: 08/11/2014 18:41   Ct Cervical Spine Wo Contrast  08/11/2014   CLINICAL DATA:  Facial injury after falling and gravel while walking dog.  EXAM: CT HEAD WITHOUT CONTRAST  CT MAXILLOFACIAL WITHOUT CONTRAST  CT CERVICAL SPINE WITHOUT CONTRAST  TECHNIQUE: Multidetector CT imaging of the head, cervical spine, and maxillofacial structures were performed using the standard protocol without intravenous contrast. Multiplanar CT image reconstructions of the cervical spine and maxillofacial structures were also generated.  COMPARISON:  CT scan of January 13, 2013.  FINDINGS: CT HEAD FINDINGS  Bony calvarium appears intact. Mild diffuse cortical atrophy is noted. Mild chronic ischemic white matter disease is noted. No mass effect or midline shift is noted. Ventricular size is within normal limits. There is no evidence of mass lesion or acute infarction. Small subdural hematoma is noted in right temporal region and middle cranial fossa with maximum thickness of 3 mm.  CT MAXILLOFACIAL FINDINGS  No fracture or other bony abnormality is noted. Pterygoid plates appear normal. Paranasal sinuses appear normal. Globes and orbits appear normal.  CT CERVICAL SPINE FINDINGS  No fracture or spondylolisthesis is noted. Moderate degenerative disc disease is noted at C5-6 and C6-7. Mild degenerative changes seen involving the posterior facet  joints. Visualized portion of lung apices appear normal.  IMPRESSION: No significant abnormality seen in the maxillofacial region.  Moderate degenerative disc disease is noted at C5-6 and C6-7. No acute abnormality seen in the cervical spine.  Small right temporal subdural hematoma is noted without significant mass effect or midline shift. Critical Value/emergent results were called by telephone at the time of interpretation on 08/11/2014 at 6:40 pm to Dr. Vanetta Mulders , who verbally acknowledged these results.   Electronically Signed   By: Lupita Raider, M.D.   On: 08/11/2014 18:41   Dg Knee Complete 4 Views Left  08/11/2014   CLINICAL DATA:  Fall, left knee pain.  EXAM: LEFT KNEE - COMPLETE 4+ VIEW  COMPARISON:  MRI 09/17/2003.  FINDINGS: There is a fracture through the inferior pole of the left pigtail. Minimal displacement of the inferior fracture fragment. No additional fracture. Small joint effusion. Joint spaces are maintained.  IMPRESSION: Fracture through the inferior pole of the left patella.   Electronically Signed   By: Charlett Nose M.D.   On: 08/11/2014 18:55   Dg Knee Complete 4 Views Right  08/11/2014   CLINICAL DATA:  Fall onto left knee today with injury. Difficulty moving the knee.  EXAM: RIGHT KNEE - COMPLETE 4+ VIEW  COMPARISON:  None.  FINDINGS: Bony demineralization. Prepatellar soft tissue swelling. Indistinct patellar tendon without overt patella alta. Chronic appearing spurring along the quadriceps attachment site to the patella. No overt knee effusion.  IMPRESSION: 1. Prepatellar edema or bursitis. Indistinct patellar tendon, but without patella alta to suggest rupture of the patellar tendon. Correlate with any patellar laxity at physical exam. 2. Bony demineralization.   Electronically Signed   By: Gaylyn Rong M.D.   On: 08/11/2014 18:56   Dg Hips Bilat With Pelvis 2v  08/11/2014   CLINICAL DATA:  Larey Seat outside today. Left knee pain. Difficulty moving.  EXAM: BILATERAL  HIP (WITH PELVIS) 2 VIEWS  COMPARISON:  Pelvis 03/19/2012  FINDINGS: Pelvis and both hips appear intact. No acute fracture or dislocation identified. No focal bone lesion or bone destruction. SI joints and symphysis pubis are not displaced. Vascular calcifications are present. Degenerative changes in the lower lumbar spine.  IMPRESSION: No acute fracture or dislocation demonstrated in the pelvis or hips.   Electronically Signed   By: Burman Nieves M.D.   On: 08/11/2014 18:58   Ct Maxillofacial Wo Cm  08/11/2014   CLINICAL DATA:  Facial injury after falling and gravel while walking dog.  EXAM: CT HEAD WITHOUT CONTRAST  CT MAXILLOFACIAL WITHOUT CONTRAST  CT CERVICAL SPINE WITHOUT CONTRAST  TECHNIQUE: Multidetector CT imaging of the head, cervical spine, and maxillofacial structures were performed using the standard protocol without intravenous contrast. Multiplanar CT image reconstructions of the cervical spine  and maxillofacial structures were also generated.  COMPARISON:  CT scan of January 13, 2013.  FINDINGS: CT HEAD FINDINGS  Bony calvarium appears intact. Mild diffuse cortical atrophy is noted. Mild chronic ischemic white matter disease is noted. No mass effect or midline shift is noted. Ventricular size is within normal limits. There is no evidence of mass lesion or acute infarction. Small subdural hematoma is noted in right temporal region and middle cranial fossa with maximum thickness of 3 mm.  CT MAXILLOFACIAL FINDINGS  No fracture or other bony abnormality is noted. Pterygoid plates appear normal. Paranasal sinuses appear normal. Globes and orbits appear normal.  CT CERVICAL SPINE FINDINGS  No fracture or spondylolisthesis is noted. Moderate degenerative disc disease is noted at C5-6 and C6-7. Mild degenerative changes seen involving the posterior facet joints. Visualized portion of lung apices appear normal.  IMPRESSION: No significant abnormality seen in the maxillofacial region.  Moderate  degenerative disc disease is noted at C5-6 and C6-7. No acute abnormality seen in the cervical spine.  Small right temporal subdural hematoma is noted without significant mass effect or midline shift. Critical Value/emergent results were called by telephone at the time of interpretation on 08/11/2014 at 6:40 pm to Dr. Vanetta Mulders , who verbally acknowledged these results.   Electronically Signed   By: Lupita Raider, M.D.   On: 08/11/2014 18:41     ASSESSMENT/PLAN:  Left patellar fracture -  or home health PT, OT, CNA, nurse and ST; continue Bledsoe brace; continue Tylenol 325 mg 2 tabs E0 every 6 hours when necessary and Ultram 50 mg 1-2 tabs by mouth every 6 hours when necessary for pain Right subdural hematoma - monitory her clinically Dementia - stable; continue Namenda XR 28 mg daily Anxiety - mood is stable; continue Xanax 1 mg by mouth every 8 hours when necessary Hyperlipidemia - continue simvastatin 80 mg by mouth daily Hypertension - continue lisinopril 20 mg by mouth daily and metoprolol 50 mg by mouth twice a day Osteoporosis - continue Fosamax     I have filled out patient's discharge paperwork and written prescriptions.  Patient will receive home health PT, OT, ST, Nursing and CNA.  DME provided:  Wheelchair with extended leg rests and 3 in one bedside commode  Total discharge time: Greater than 30 minutes  Discharge time involved coordination of the discharge process with Child psychotherapist, nursing staff and therapy department. Medical justification for home health services/DME verified.    [facility], NP [employer] [phone]

## 2014-09-21 NOTE — Progress Notes (Signed)
This encounter was created in error - please disregard.

## 2014-09-30 ENCOUNTER — Emergency Department (HOSPITAL_COMMUNITY): Payer: Medicare Other

## 2014-09-30 ENCOUNTER — Inpatient Hospital Stay (HOSPITAL_COMMUNITY)
Admission: EM | Admit: 2014-09-30 | Discharge: 2014-10-01 | DRG: 069 | Disposition: A | Discharge status: Home / Self Care | Payer: Medicare Other | Attending: Internal Medicine | Admitting: Internal Medicine

## 2014-09-30 ENCOUNTER — Encounter (HOSPITAL_COMMUNITY): Payer: Self-pay | Admitting: Nurse Practitioner

## 2014-09-30 DIAGNOSIS — Z79899 Other long term (current) drug therapy: Secondary | ICD-10-CM | POA: Diagnosis not present

## 2014-09-30 DIAGNOSIS — M810 Age-related osteoporosis without current pathological fracture: Secondary | ICD-10-CM | POA: Diagnosis present

## 2014-09-30 DIAGNOSIS — J9811 Atelectasis: Secondary | ICD-10-CM | POA: Diagnosis present

## 2014-09-30 DIAGNOSIS — R410 Disorientation, unspecified: Secondary | ICD-10-CM

## 2014-09-30 DIAGNOSIS — Z888 Allergy status to other drugs, medicaments and biological substances status: Secondary | ICD-10-CM

## 2014-09-30 DIAGNOSIS — M47892 Other spondylosis, cervical region: Secondary | ICD-10-CM | POA: Diagnosis present

## 2014-09-30 DIAGNOSIS — G2581 Restless legs syndrome: Secondary | ICD-10-CM | POA: Diagnosis present

## 2014-09-30 DIAGNOSIS — E785 Hyperlipidemia, unspecified: Secondary | ICD-10-CM | POA: Diagnosis present

## 2014-09-30 DIAGNOSIS — G459 Transient cerebral ischemic attack, unspecified: Secondary | ICD-10-CM | POA: Diagnosis not present

## 2014-09-30 DIAGNOSIS — Z885 Allergy status to narcotic agent status: Secondary | ICD-10-CM | POA: Diagnosis not present

## 2014-09-30 DIAGNOSIS — E875 Hyperkalemia: Secondary | ICD-10-CM | POA: Diagnosis present

## 2014-09-30 DIAGNOSIS — I639 Cerebral infarction, unspecified: Secondary | ICD-10-CM | POA: Diagnosis not present

## 2014-09-30 DIAGNOSIS — E876 Hypokalemia: Secondary | ICD-10-CM | POA: Diagnosis present

## 2014-09-30 DIAGNOSIS — S065XAA Traumatic subdural hemorrhage with loss of consciousness status unknown, initial encounter: Secondary | ICD-10-CM | POA: Diagnosis present

## 2014-09-30 DIAGNOSIS — I10 Essential (primary) hypertension: Secondary | ICD-10-CM | POA: Diagnosis present

## 2014-09-30 DIAGNOSIS — I6200 Nontraumatic subdural hemorrhage, unspecified: Secondary | ICD-10-CM

## 2014-09-30 DIAGNOSIS — Z86010 Personal history of colonic polyps: Secondary | ICD-10-CM | POA: Diagnosis not present

## 2014-09-30 DIAGNOSIS — Z87891 Personal history of nicotine dependence: Secondary | ICD-10-CM | POA: Diagnosis not present

## 2014-09-30 DIAGNOSIS — I6529 Occlusion and stenosis of unspecified carotid artery: Secondary | ICD-10-CM

## 2014-09-30 DIAGNOSIS — F0390 Unspecified dementia without behavioral disturbance: Secondary | ICD-10-CM | POA: Diagnosis present

## 2014-09-30 DIAGNOSIS — R269 Unspecified abnormalities of gait and mobility: Secondary | ICD-10-CM

## 2014-09-30 DIAGNOSIS — Z8673 Personal history of transient ischemic attack (TIA), and cerebral infarction without residual deficits: Secondary | ICD-10-CM | POA: Diagnosis not present

## 2014-09-30 DIAGNOSIS — S065X9A Traumatic subdural hemorrhage with loss of consciousness of unspecified duration, initial encounter: Secondary | ICD-10-CM | POA: Diagnosis present

## 2014-09-30 DIAGNOSIS — R4781 Slurred speech: Secondary | ICD-10-CM | POA: Diagnosis present

## 2014-09-30 DIAGNOSIS — R4182 Altered mental status, unspecified: Secondary | ICD-10-CM

## 2014-09-30 LAB — DIFFERENTIAL
Basophils Absolute: 0 10*3/uL (ref 0.0–0.1)
Basophils Relative: 0 % (ref 0–1)
EOS PCT: 3 % (ref 0–5)
Eosinophils Absolute: 0.1 10*3/uL (ref 0.0–0.7)
Lymphocytes Relative: 20 % (ref 12–46)
Lymphs Abs: 1 10*3/uL (ref 0.7–4.0)
Monocytes Absolute: 0.4 10*3/uL (ref 0.1–1.0)
Monocytes Relative: 8 % (ref 3–12)
NEUTROS ABS: 3.7 10*3/uL (ref 1.7–7.7)
Neutrophils Relative %: 69 % (ref 43–77)

## 2014-09-30 LAB — COMPREHENSIVE METABOLIC PANEL
ALT: 14 U/L (ref 0–35)
ANION GAP: 9 (ref 5–15)
AST: 24 U/L (ref 0–37)
Albumin: 3.7 g/dL (ref 3.5–5.2)
Alkaline Phosphatase: 49 U/L (ref 39–117)
BUN: 10 mg/dL (ref 6–23)
CALCIUM: 9.3 mg/dL (ref 8.4–10.5)
CO2: 34 mmol/L — AB (ref 19–32)
Chloride: 100 mmol/L (ref 96–112)
Creatinine, Ser: 0.82 mg/dL (ref 0.50–1.10)
GFR, EST AFRICAN AMERICAN: 77 mL/min — AB (ref >90)
GFR, EST NON AFRICAN AMERICAN: 66 mL/min — AB (ref >90)
Glucose, Bld: 112 mg/dL — ABNORMAL HIGH (ref 70–99)
Potassium: 2.7 mmol/L — CL (ref 3.5–5.1)
SODIUM: 143 mmol/L (ref 135–145)
TOTAL PROTEIN: 6.1 g/dL (ref 6.0–8.3)
Total Bilirubin: 0.7 mg/dL (ref 0.3–1.2)

## 2014-09-30 LAB — GLUCOSE, CAPILLARY: Glucose-Capillary: 110 mg/dL — ABNORMAL HIGH (ref 70–99)

## 2014-09-30 LAB — RAPID URINE DRUG SCREEN, HOSP PERFORMED
Amphetamines: NOT DETECTED
BENZODIAZEPINES: POSITIVE — AB
Barbiturates: NOT DETECTED
Cocaine: NOT DETECTED
Opiates: NOT DETECTED
TETRAHYDROCANNABINOL: NOT DETECTED

## 2014-09-30 LAB — URINALYSIS, ROUTINE W REFLEX MICROSCOPIC
GLUCOSE, UA: NEGATIVE mg/dL
Hgb urine dipstick: NEGATIVE
Ketones, ur: NEGATIVE mg/dL
Leukocytes, UA: NEGATIVE
Nitrite: NEGATIVE
Protein, ur: NEGATIVE mg/dL
Specific Gravity, Urine: 1.022 (ref 1.005–1.030)
Urobilinogen, UA: 1 mg/dL (ref 0.0–1.0)
pH: 5.5 (ref 5.0–8.0)

## 2014-09-30 LAB — I-STAT CG4 LACTIC ACID, ED
Lactic Acid, Venous: 0.99 mmol/L (ref 0.5–2.0)
Lactic Acid, Venous: 1.12 mmol/L (ref 0.5–2.0)

## 2014-09-30 LAB — CBC
HCT: 37.4 % (ref 36.0–46.0)
Hemoglobin: 12.7 g/dL (ref 12.0–15.0)
MCH: 30.2 pg (ref 26.0–34.0)
MCHC: 34 g/dL (ref 30.0–36.0)
MCV: 88.8 fL (ref 78.0–100.0)
Platelets: 213 10*3/uL (ref 150–400)
RBC: 4.21 MIL/uL (ref 3.87–5.11)
RDW: 13.6 % (ref 11.5–15.5)
WBC: 5.3 10*3/uL (ref 4.0–10.5)

## 2014-09-30 LAB — I-STAT TROPONIN, ED: Troponin i, poc: 0 ng/mL (ref 0.00–0.08)

## 2014-09-30 LAB — MAGNESIUM: MAGNESIUM: 1.9 mg/dL (ref 1.5–2.5)

## 2014-09-30 LAB — APTT: APTT: 26 s (ref 24–37)

## 2014-09-30 LAB — PROTIME-INR
INR: 1.1 (ref 0.00–1.49)
Prothrombin Time: 14.3 seconds (ref 11.6–15.2)

## 2014-09-30 LAB — CBG MONITORING, ED: Glucose-Capillary: 139 mg/dL — ABNORMAL HIGH (ref 70–99)

## 2014-09-30 MED ORDER — FENOFIBRATE 160 MG PO TABS
160.0000 mg | ORAL_TABLET | Freq: Every day | ORAL | Status: DC
  Administered 2014-10-01: 160 mg via ORAL
  Filled 2014-09-30: qty 1

## 2014-09-30 MED ORDER — ATORVASTATIN CALCIUM 40 MG PO TABS
40.0000 mg | ORAL_TABLET | Freq: Every day | ORAL | Status: DC
  Administered 2014-09-30: 40 mg via ORAL
  Filled 2014-09-30: qty 1

## 2014-09-30 MED ORDER — POTASSIUM CHLORIDE 10 MEQ/100ML IV SOLN
10.0000 meq | Freq: Once | INTRAVENOUS | Status: AC
  Administered 2014-09-30: 10 meq via INTRAVENOUS
  Filled 2014-09-30: qty 100

## 2014-09-30 MED ORDER — VITAMIN D (ERGOCALCIFEROL) 1.25 MG (50000 UNIT) PO CAPS
50000.0000 [IU] | ORAL_CAPSULE | ORAL | Status: DC
  Filled 2014-09-30: qty 1

## 2014-09-30 MED ORDER — LISINOPRIL 20 MG PO TABS
20.0000 mg | ORAL_TABLET | Freq: Every day | ORAL | Status: DC
  Administered 2014-10-01: 20 mg via ORAL
  Filled 2014-09-30: qty 1

## 2014-09-30 MED ORDER — CLOPIDOGREL BISULFATE 75 MG PO TABS
75.0000 mg | ORAL_TABLET | Freq: Every day | ORAL | Status: DC
  Administered 2014-09-30 – 2014-10-01 (×2): 75 mg via ORAL
  Filled 2014-09-30 (×3): qty 1

## 2014-09-30 MED ORDER — ALPRAZOLAM 0.5 MG PO TABS
0.5000 mg | ORAL_TABLET | Freq: Three times a day (TID) | ORAL | Status: DC | PRN
  Administered 2014-09-30: 0.5 mg via ORAL
  Filled 2014-09-30: qty 1

## 2014-09-30 MED ORDER — ERGOCALCIFEROL 1.25 MG (50000 UT) PO CAPS: 50000.0000 [IU] | ORAL_CAPSULE | ORAL | Status: DC

## 2014-09-30 MED ORDER — POTASSIUM CHLORIDE CRYS ER 20 MEQ PO TBCR
40.0000 meq | EXTENDED_RELEASE_TABLET | Freq: Once | ORAL | Status: AC
  Administered 2014-09-30: 40 meq via ORAL
  Filled 2014-09-30: qty 2

## 2014-09-30 MED ORDER — HEPARIN SODIUM (PORCINE) 5000 UNIT/ML IJ SOLN
5000.0000 [IU] | Freq: Three times a day (TID) | INTRAMUSCULAR | Status: DC
  Administered 2014-09-30 – 2014-10-01 (×2): 5000 [IU] via SUBCUTANEOUS
  Filled 2014-09-30 (×2): qty 1

## 2014-09-30 MED ORDER — ADULT MULTIVITAMIN W/MINERALS CH
1.0000 | ORAL_TABLET | Freq: Every morning | ORAL | Status: DC
  Administered 2014-10-01: 1 via ORAL
  Filled 2014-09-30: qty 1

## 2014-09-30 MED ORDER — LISINOPRIL 20 MG PO TABS
40.0000 mg | ORAL_TABLET | Freq: Every day | ORAL | Status: DC
Start: 2014-10-01 — End: 2014-09-30

## 2014-09-30 MED ORDER — SERTRALINE HCL 50 MG PO TABS
25.0000 mg | ORAL_TABLET | Freq: Every day | ORAL | Status: DC
  Administered 2014-10-01: 25 mg via ORAL
  Filled 2014-09-30: qty 1

## 2014-09-30 MED ORDER — ACETAMINOPHEN 500 MG PO TABS
500.0000 mg | ORAL_TABLET | Freq: Four times a day (QID) | ORAL | Status: DC | PRN
  Administered 2014-09-30: 500 mg via ORAL
  Filled 2014-09-30: qty 1

## 2014-09-30 MED ORDER — SODIUM CHLORIDE 0.9 % IV SOLN: INTRAVENOUS | Status: DC

## 2014-09-30 MED ORDER — STROKE: EARLY STAGES OF RECOVERY BOOK
Freq: Once | Status: AC
  Administered 2014-09-30: 1

## 2014-09-30 MED ORDER — MEMANTINE HCL ER 28 MG PO CP24
28.0000 mg | ORAL_CAPSULE | Freq: Every day | ORAL | Status: DC
  Administered 2014-10-01: 28 mg via ORAL
  Filled 2014-09-30: qty 1

## 2014-09-30 NOTE — Consult Note (Signed)
Referring Physician: Dr. Clarene Duke    Chief Complaint: code stroke, dysarthria, confusion  HPI:                                                                                                                                         Heather Floyd is an 81 y.o. female with a past medical history significant for HTN, hyperlipidemia, carotid artery occlusion, TIA, significant fall 08/10/14 resulting in small right parietal post traumatic SHD, and dementia, brought in by family for further evaluation of the above stated symptoms. Family is at the bedside and said that earlier this morning patient was very disoriented, confused, lethargic and drowsy and her speech was very slurred. She was not able to properly use the utensils when eating breakfast. They report no evidence of focal weakness, face droopiness, HA, vertigo, or double vision. While in the ED patient was able to recognize family members. NIHSS 2 (speech and missed date). CT brain was personally reviewed and showed no acute abnormality. No recent fever or medical illness. UA and metabolic panel are pending, but CBC is normal.   Date last known well: 09/29/14 Time last known well: pm tPA Given: no, out of the window, minimal deficits, recent SDH NIHSS: 2   Past Medical History  Diagnosis Date  . Hyperlipidemia   . Hypertension   . RLS (restless legs syndrome)   . Sleep disorder   . Personal history of colonic polyps   . Osteoporosis   . DJD (degenerative joint disease) of cervical spine   . Varicose veins   . TIA (transient ischemic attack)   . Carotid artery occlusion   . Memory disturbance 03/29/2013  . Abnormality of gait 09/08/2014  . Concussion with loss of consciousness 09/08/2014    Past Surgical History  Procedure Laterality Date  . Anterior and posterior vaginal repair w/ sacrospinous ligament suspension    . Endovenous ablation saphenous vein w/ laser    . Breast reduction surgery    . Cesarean section    .  Hemorrhoid surgery      Family History  Problem Relation Age of Onset  . Mental illness Mother   . Alzheimer's disease Mother   . Cancer Father   . Bone cancer Father   . Dementia Sister    Social History:  reports that she quit smoking about 34 years ago. She has never used smokeless tobacco. She reports that she does not drink alcohol or use illicit drugs.  Allergies:  Allergies  Allergen Reactions  . Aspirin Other (See Comments)    Makes blood too thin  . Codeine Nausea Only    Medications:  I have reviewed the patient's current medications.  ROS: unable to obtain due to mental status                                                                                         History obtained from family and chart review    Physical exam: pleasantly confused female in no apparent distress. Blood pressure 122/56, pulse 67, temperature 97.8 F (36.6 C), temperature source Oral, resp. rate 18, SpO2 89 %. Head: normocephalic. Neck: supple, no bruits, no JVD. Cardiac: no murmurs. Lungs: clear. Abdomen: soft, no tender, no mass. Extremities: no edema. Pulses: palpable Skin: no rash  Neurologic Examination:                                                                                                      General: Mental Status: Alert, awake, disoriented to year-month.  Speech with mild dysarthria but without evidence of aphasia.  Able to follow 3 step commands without difficulty. Cranial Nerves: II: Discs flat bilaterally; Visual Molina grossly normal, pupils equal, round, reactive to light and accommodation III,IV, VI: ptosis not present, extra-ocular motions intact bilaterally V,VII: smile symmetric, facial light touch sensation normal bilaterally VIII: hearing normal bilaterally IX,X: uvula rises symmetrically XI: bilateral shoulder shrug XII:  midline tongue extension without atrophy or fasciculations Motor: Moves all limbs symmetrically, no obvious weakness. Tone and bulk:normal tone throughout; no atrophy noted Sensory: Pinprick and light touch intact throughout, bilaterally Deep Tendon Reflexes:  1+ all over Plantars: Right: downgoing   Left: downgoing Cerebellar: normal finger-to-nose,  normal heel-to-shin test Gait:  Unable to test at this moment.      Results for orders placed or performed during the hospital encounter of 09/30/14 (from the past 48 hour(s))  Protime-INR     Status: None   Collection Time: 09/30/14 11:35 AM  Result Value Ref Range   Prothrombin Time 14.3 11.6 - 15.2 seconds   INR 1.10 0.00 - 1.49  APTT     Status: None   Collection Time: 09/30/14 11:35 AM  Result Value Ref Range   aPTT 26 24 - 37 seconds  CBC     Status: None   Collection Time: 09/30/14 11:35 AM  Result Value Ref Range   WBC 5.3 4.0 - 10.5 K/uL   RBC 4.21 3.87 - 5.11 MIL/uL   Hemoglobin 12.7 12.0 - 15.0 g/dL   HCT 81.137.4 91.436.0 - 78.246.0 %   MCV 88.8 78.0 - 100.0 fL   MCH 30.2 26.0 - 34.0 pg   MCHC 34.0 30.0 - 36.0 g/dL   RDW 95.613.6 21.311.5 - 08.615.5 %   Platelets 213 150 - 400 K/uL  Differential     Status: None   Collection Time:  09/30/14 11:35 AM  Result Value Ref Range   Neutrophils Relative % 69 43 - 77 %   Neutro Abs 3.7 1.7 - 7.7 K/uL   Lymphocytes Relative 20 12 - 46 %   Lymphs Abs 1.0 0.7 - 4.0 K/uL   Monocytes Relative 8 3 - 12 %   Monocytes Absolute 0.4 0.1 - 1.0 K/uL   Eosinophils Relative 3 0 - 5 %   Eosinophils Absolute 0.1 0.0 - 0.7 K/uL   Basophils Relative 0 0 - 1 %   Basophils Absolute 0.0 0.0 - 0.1 K/uL  I-stat troponin, ED (not at [facility])     Status: None   Collection Time: 09/30/14 11:40 AM  Result Value Ref Range   Troponin i, poc 0.00 0.00 - 0.08 ng/mL   Comment 3            Comment: Due to the release kinetics of cTnI, a negative result within the first hours of the onset of symptoms does not  rule out myocardial infarction with certainty. If myocardial infarction is still suspected, repeat the test at appropriate intervals.   I-Stat CG4 Lactic Acid, ED     Status: None   Collection Time: 09/30/14 11:45 AM  Result Value Ref Range   Lactic Acid, Venous 0.99 0.5 - 2.0 mmol/L  CBG monitoring, ED     Status: Abnormal   Collection Time: 09/30/14 11:50 AM  Result Value Ref Range   Glucose-Capillary 139 (H) 70 - 99 mg/dL   Comment 1 Notify RN    Ct Head (brain) Wo Contrast  09/30/2014   CLINICAL DATA:  Lethargy with slurred speech  EXAM: CT HEAD WITHOUT CONTRAST  TECHNIQUE: Contiguous axial images were obtained from the base of the skull through the vertex without intravenous contrast.  COMPARISON:  08/12/2014  FINDINGS: The bony calvarium is intact. No gross soft tissue abnormality is noted. Atrophic changes are seen. Mild chronic white matter ischemic changes noted. No findings to suggest acute hemorrhage are noted. The previously seen right subdural hematoma is no longer identified.  IMPRESSION: Mild atrophic changes and chronic white matter ischemic change. No acute hemorrhage is identified.  These results were called by telephone at the time of interpretation on 09/30/2014 at 11:55 am to Dr. Leroy Kennedy, who verbally acknowledged these results.   Electronically Signed   By: Alcide Clever M.D.   On: 09/30/2014 11:56    Assessment: 81 y.o. female with  HTN, hyperlipidemia, carotid artery occlusion, TIA, significant fall 08/10/14 resulting in small right parietal post traumatic SHD, and dementia, brought in by family for further evaluation of dysarthria and increasing confusion and disorientation. NIHSS 2 (in reality 1, as at baseline she can not recall year-month-day) and CT brain without acute abnormality. Very mild deficits and out of the window for thrombolysis. Although can not exclude a small ischemic stroke, it is also likely that her symptoms could be related to underlying dementia with  perhaps a superimposed UTI or a focal seizure with impairment of awareness. Recommend admission to medicine, getting MRI brain, EEG, and pursuing further stroke work up if MRI positive. Plavix, as aspirin reportedly causes significant skin bleeding. Will follow up.  Wyatt Portela, MD [facility] [phone]  09/30/2014, 12:09 PM

## 2014-09-30 NOTE — ED Provider Notes (Signed)
CSN: [id number]     Arrival date & time 09/30/14  1111 History   First MD Initiated Contact with Patient 09/30/14 1129     Chief Complaint  Patient presents with  . Altered Mental Status    An emergency department physician performed an initial assessment on this suspected stroke patient at 1135.  Patient is a 81 y.o. female presenting with altered mental status. The history is provided by a relative and the patient. The history is limited by the condition of the patient (Hx dementia).  Altered Mental Status Pt was seen at 1130. Per pt and her family: c/o sudden onset and resolution of separate 2 episodes of AMS that occurred today PTA. Pt lives with one daughter who last saw pt last night before bedtime (LKW last night). No one saw pt this morning, but pt's other daughter called her at 0800 "and she sounded OK." Pt's daughter then came to the house and found pt "sitting in a chair staring off." Pt's daughter states pt was "unresponsive like that" for "at least 5 minutes" before she "came around." Pt was more confused that her usual baseline dementia and had "slurred speech." Pt's family states they walked pt to the kitchen table to sit, where she had another "unresponsive" episode. Pt's family states pt remained more confused than usual, as well as had slurred speech and "looked glassy eyed." Pt's family took pt out to eat where "she couldn't use the fork right." Pt's family felt pt was "uncoordinated" with her right hand, but was able to chew and swallow her food without difficulty, choking, etc. Pt's family states pt continued to be intermittently "lethargic," so they brought her to the ED for further evaluation. Pt herself denies any complaints. Denies CP/SOB, no cough, no abd pain, no N/V/D, no falls, no neck or back pain.   Past Medical History  Diagnosis Date  . Hyperlipidemia   . Hypertension   . RLS (restless legs syndrome)   . Sleep disorder   . Personal history of colonic polyps   .  Osteoporosis   . DJD (degenerative joint disease) of cervical spine   . Varicose veins   . TIA (transient ischemic attack)   . Carotid artery occlusion   . Memory disturbance 03/29/2013  . Abnormality of gait 09/08/2014  . Concussion with loss of consciousness 09/08/2014   Past Surgical History  Procedure Laterality Date  . Anterior and posterior vaginal repair w/ sacrospinous ligament suspension    . Endovenous ablation saphenous vein w/ laser    . Breast reduction surgery    . Cesarean section    . Hemorrhoid surgery     Family History  Problem Relation Age of Onset  . Mental illness Mother   . Alzheimer's disease Mother   . Cancer Father   . Bone cancer Father   . Dementia Sister    History  Substance Use Topics  . Smoking status: Former Smoker    Quit date: 06/16/1980  . Smokeless tobacco: Never Used  . Alcohol Use: No    Review of Systems  Unable to perform ROS: Dementia      Allergies  Aspirin and Codeine  Home Medications   Prior to Admission medications   Medication Sig Start Date End Date Taking? Authorizing Provider  alendronate (FOSAMAX) 70 MG tablet Take 70 mg by mouth once a week. Take with a full glass of water on an empty stomach. On Monday   Yes Historical Provider, MD  ALPRAZolam [name]) 1  MG tablet Take 1 tablet (1 mg total) by mouth every 8 (eight) hours as needed for anxiety. Patient taking differently: Take 0.5 mg by mouth every 8 (eight) hours as needed for anxiety.  09/08/14  Yes [name], MD  ergocalciferol (VITAMIN D2) 50000 UNITS capsule Take 50,000 Units by mouth once a week. Take on Mondays   Yes Historical Provider, MD  fenofibrate micronized (LOFIBRA) 200 MG capsule Take 200 mg by mouth daily before breakfast.   Yes Historical Provider, MD  lisinopril (PRINIVIL,ZESTRIL) 40 MG tablet Take 40 mg by mouth daily.   Yes Historical Provider, MD  Memantine HCl ER (NAMENDA XR) 28 MG CP24 Take 28 mg by mouth daily. 05/29/14  Yes [name], MD  Multiple Vitamin (MULTIVITAMIN WITH MINERALS) TABS tablet Take 1 tablet by mouth every morning.   Yes Historical Provider, MD  sertraline (ZOLOFT) 25 MG tablet One tablet daily for 2 weeks, the take 2 tablets daily Patient taking differently: Take 25 mg by mouth daily. One tablet daily for 2 weeks, the take 2 tablets daily 09/08/14  Yes [name], MD  simvastatin (ZOCOR) 40 MG tablet Take 40 mg by mouth daily.   Yes Historical Provider, MD  traMADol (ULTRAM) 50 MG tablet Take one tablet by mouth every 6 hours as needed for mild pain; Take two tablets by mouth every 6 hours as needed for severe pain. Patient not taking: Reported on 09/30/2014 08/21/14   Tiffany L Reed, DO   BP 147/78 mmHg  Pulse 65  Temp(Src) 97.8 F (36.6 C) (Oral)  Resp 18  SpO2 99% Physical Exam  1135; Physical examination:  Nursing notes reviewed; Vital signs and O2 SAT reviewed;  Constitutional: Well developed, Well nourished, Well hydrated, In no acute distress; Head:  Normocephalic, atraumatic; Eyes: EOMI, PERRL, No scleral icterus; ENMT: Mouth and pharynx normal, Mucous membranes moist; Neck: Supple, Full range of motion, No lymphadenopathy; Cardiovascular: Regular rate and rhythm, No gallop; Respiratory: Breath sounds clear & equal bilaterally, No wheezes.  Speaking full sentences with ease, Normal respiratory effort/excursion; Chest: Nontender, Movement normal; Abdomen: Soft, Nontender, Nondistended, Normal bowel sounds; Genitourinary: No CVA tenderness; Extremities: Pulses normal, No tenderness, No edema, No calf edema or asymmetry.; Neuro: Awake, alert, confused re: time, events per hx dementia. Major CN grossly intact. Speech clear.  No facial droop.  No nystagmus. Grips equal. Strength 5/5 equal bilat UE's and LE's.  DTR 2/4 equal bilat UE's and LE's.  No gross sensory deficits.  Normal cerebellar testing bilat UE's (finger-nose) and LE's (heel-shin)..; Skin: Color normal, Warm, Dry.   ED Course   Procedures     EKG Interpretation   Date/Time:  Saturday September 30 2014 11:20:55 EDT Ventricular Rate:  73 PR Interval:  125 QRS Duration: 71 QT Interval:  411 QTC Calculation: 453 R Axis:   -7 Text Interpretation:  Sinus rhythm Atrial premature complex LVH by voltage  Borderline T abnormalities, anterior leads Baseline wander When compared  with ECG of 01/12/2013 No significant change was found Confirmed by  [facility]  MD, Nicholos Johns [phone]) on 09/30/2014 12:00:47 PM      MDM  MDM Reviewed: previous chart, nursing note and vitals Reviewed previous: labs and ECG Interpretation: labs, ECG, x-ray, MRI and CT scan   Results for orders placed or performed during the hospital encounter of 09/30/14  Protime-INR  Result Value Ref Range   Prothrombin Time 14.3 11.6 - 15.2 seconds   INR 1.10 0.00 - 1.49  APTT  Result Value Ref Range   aPTT 26 24 - 37 seconds  CBC  Result Value Ref Range   WBC 5.3 4.0 - 10.5 K/uL   RBC 4.21 3.87 - 5.11 MIL/uL   Hemoglobin 12.7 12.0 - 15.0 g/dL   HCT 16.1 09.6 - 04.5 %   MCV 88.8 78.0 - 100.0 fL   MCH 30.2 26.0 - 34.0 pg   MCHC 34.0 30.0 - 36.0 g/dL   RDW 40.9 81.1 - 91.4 %   Platelets 213 150 - 400 K/uL  Differential  Result Value Ref Range   Neutrophils Relative % 69 43 - 77 %   Neutro Abs 3.7 1.7 - 7.7 K/uL   Lymphocytes Relative 20 12 - 46 %   Lymphs Abs 1.0 0.7 - 4.0 K/uL   Monocytes Relative 8 3 - 12 %   Monocytes Absolute 0.4 0.1 - 1.0 K/uL   Eosinophils Relative 3 0 - 5 %   Eosinophils Absolute 0.1 0.0 - 0.7 K/uL   Basophils Relative 0 0 - 1 %   Basophils Absolute 0.0 0.0 - 0.1 K/uL  Comprehensive metabolic panel  Result Value Ref Range   Sodium 143 135 - 145 mmol/L   Potassium 2.7 (LL) 3.5 - 5.1 mmol/L   Chloride 100 96 - 112 mmol/L   CO2 34 (H) 19 - 32 mmol/L   Glucose, Bld 112 (H) 70 - 99 mg/dL   BUN 10 6 - 23 mg/dL   Creatinine, Ser 7.82 0.50 - 1.10 mg/dL   Calcium 9.3 8.4 - 95.6 mg/dL   Total Protein 6.1 6.0 - 8.3  g/dL   Albumin 3.7 3.5 - 5.2 g/dL   AST 24 0 - 37 U/L   ALT 14 0 - 35 U/L   Alkaline Phosphatase 49 39 - 117 U/L   Total Bilirubin 0.7 0.3 - 1.2 mg/dL   GFR calc non Af Amer 66 (L) >90 mL/min   GFR calc Af Amer 77 (L) >90 mL/min   Anion gap 9 5 - 15  Urinalysis, Routine w reflex microscopic  Result Value Ref Range   Color, Urine AMBER (A) YELLOW   APPearance CLEAR CLEAR   Specific Gravity, Urine 1.022 1.005 - 1.030   pH 5.5 5.0 - 8.0   Glucose, UA NEGATIVE NEGATIVE mg/dL   Hgb urine dipstick NEGATIVE NEGATIVE   Bilirubin Urine SMALL (A) NEGATIVE   Ketones, ur NEGATIVE NEGATIVE mg/dL   Protein, ur NEGATIVE NEGATIVE mg/dL   Urobilinogen, UA 1.0 0.0 - 1.0 mg/dL   Nitrite NEGATIVE NEGATIVE   Leukocytes, UA NEGATIVE NEGATIVE  Urine rapid drug screen (hosp performed)  Result Value Ref Range   Opiates NONE DETECTED NONE DETECTED   Cocaine NONE DETECTED NONE DETECTED   Benzodiazepines POSITIVE (A) NONE DETECTED   Amphetamines NONE DETECTED NONE DETECTED   Tetrahydrocannabinol NONE DETECTED NONE DETECTED   Barbiturates NONE DETECTED NONE DETECTED  CBG monitoring, ED  Result Value Ref Range   Glucose-Capillary 139 (H) 70 - 99 mg/dL   Comment 1 Notify RN   I-stat troponin, ED (not at [facility])  Result Value Ref Range   Troponin i, poc 0.00 0.00 - 0.08 ng/mL   Comment 3          I-Stat CG4 Lactic Acid, ED  Result Value Ref Range   Lactic Acid, Venous 0.99 0.5 - 2.0 mmol/L   Dg Chest 2 View 09/30/2014   CLINICAL DATA:  Altered mental status. History of hypertension and TIA.  EXAM: CHEST  2 VIEW  COMPARISON:  08/13/2014  FINDINGS: Cardiac silhouette is borderline enlarged. Normal mediastinal and hilar contours.  Mild opacity in the left anterior lateral lung base is stable consistent with atelectasis no lung consolidation or edema. No pleural effusion or pneumothorax.  Bony thorax is intact.  IMPRESSION: No acute cardiopulmonary disease.   Electronically Signed   By: Amie Portlandavid  Ormond M.D.    On: 09/30/2014 12:54   Ct Head (brain) Wo Contrast 09/30/2014   CLINICAL DATA:  Lethargy with slurred speech  EXAM: CT HEAD WITHOUT CONTRAST  TECHNIQUE: Contiguous axial images were obtained from the base of the skull through the vertex without intravenous contrast.  COMPARISON:  08/12/2014  FINDINGS: The bony calvarium is intact. No gross soft tissue abnormality is noted. Atrophic changes are seen. Mild chronic white matter ischemic changes noted. No findings to suggest acute hemorrhage are noted. The previously seen right subdural hematoma is no longer identified.  IMPRESSION: Mild atrophic changes and chronic white matter ischemic change. No acute hemorrhage is identified.  These results were called by telephone at the time of interpretation on 09/30/2014 at 11:55 am to Dr. Leroy Kennedyamilo, who verbally acknowledged these results.   Electronically Signed   By: Alcide CleverMark  Lukens M.D.   On: 09/30/2014 11:56   Mr Brain Wo Contrast 09/30/2014   CLINICAL DATA:  81 year old female who fell in February with right side subdural hematoma. Acute confusion, Lethargy and slurred speech. Initial encounter.  EXAM: MRI HEAD WITHOUT CONTRAST  TECHNIQUE: Multiplanar, multiecho pulse sequences of the brain and surrounding structures were obtained without intravenous contrast.  COMPARISON:  Head CT without contrast 1150 hours today and earlier. Brain MRI 01/14/2013.  FINDINGS: The examination had to be discontinued prior to completion due to patient agitation.  The small right side subdural hematoma has resolved since February. No extra-axial collection identified today. No acute intracranial hemorrhage identified. No midline shift, mass effect, or evidence of intracranial mass lesion. No ventriculomegaly.  There is T2 shine through in the right corona radiata related to a chronic T2 and FLAIR hyperintense white matter lesion which is stable since 2014. No restricted diffusion or evidence of acute infarction. Chronic confluent T2  hyperintensity in the globus pallidus. Major intracranial vascular flow voids are stable. No acute signal abnormality identified. No cortical encephalomalacia. Negative pituitary, cervicomedullary junction and visualized cervical spine. Bone marrow signal within normal limits. Stable orbits soft tissues, paranasal sinuses and mastoids.  IMPRESSION: The small right subdural hematoma has resolved since February.  No acute intracranial abnormality.  The examination had to be discontinued prior to completion.   Electronically Signed   By: Odessa FlemingH  Hall M.D.   On: 09/30/2014 13:54    1320:  Pt's daughter originally told Triage RN and ED RN that she saw pt this morning at her baseline at 0930 and her symptoms began at 0945. Code Stroke was called. Upon further questioning by myself and Neuro MD, pt's LKW was actually last night (see above HPI). Code stroke cancelled. No acute CVA on MRI.  Dx and testing d/w pt and family.  Questions answered.  Verb understanding, agreeable to admit.  T/C to [facility] Dr. Mahala MenghiniSamtani, case discussed, including:  HPI, pertinent PM/SHx, VS/PE, dx testing, ED course and treatment:  Agreeable to admit, requests to write temporary orders, obtain neuro tele bed to team MCAdmits.     Samuel JesterKathleen Mileah Hemmer, DO 10/02/14 2100

## 2014-09-30 NOTE — H&P (Signed)
[facility] History and Physical  Heather Floyd:[id number] DOB: September 23, 1932 DOA: 09/30/2014  Referring physician: ED PCP: Heather Alken, MD  Specialists: Neuro  Chief Complaint:  Weakness as well as slurred speech  HPI: PCP Heather Floyd   81 y/o right-handed female with prog memory issue foll Neuro Dr. Anne Floyd, recent fall 08/10/14 with small SDH + L patellar #[admitted]-Dementia on Nam[place], HLD, Htn, prior BPPV, prior varicose veins, pelvic prolapse, prior colon polyp, Prior TIA 05/2010 Lives c Daughter Noticed this a.m. by daughter that she was talking normally at around 8 AM. At around 9 AM it was noted by daughter that she was a little disoriented, had some slurred speech setback in the recliner and leaned back and felt like she was going to sleep-daughter relates glassy eyed appearance, "drugged" appearance and felt that she may be hungry so took her to a restaurant. she had a large hearty breakfast of 2 fried eggs toast + coffee She was missing her mouth and missing some food from spoon to mouth with her right hand. Because of persisting concerns and leaning to the right without twisting of the mouth but with slurred speech patient was brought to emergency room for evaluation  Workup in emergency room reveals potassium 2.7 a.m./creatinine 10/0.8, point-of-care troponin 0.00 LFTs negative EKG = PR interval 0.08, QRS axis -7, no ST-T wave changes CXR = bibasilar atelectasis CT head = atrophic changes with chronic white matter changes no acute hemorrhage For some reason UDS performed and benzos positive    Review of Systems: Unreliable given dementia however daughter reports no recent concerns   Past Medical History  Diagnosis Date  . Hyperlipidemia   . Hypertension   . RLS (restless legs syndrome)   . Sleep disorder   . Personal history of colonic polyps   . Osteoporosis   . DJD (degenerative joint disease) of cervical spine   . Varicose veins   . TIA (transient  ischemic attack)   . Carotid artery occlusion   . Memory disturbance 03/29/2013  . Abnormality of gait 09/08/2014  . Concussion with loss of consciousness 09/08/2014   Past Surgical History  Procedure Laterality Date  . Anterior and posterior vaginal repair w/ sacrospinous ligament suspension    . Endovenous ablation saphenous vein w/ laser    . Breast reduction surgery    . Cesarean section    . Hemorrhoid surgery     Social History:  History   Social History Narrative    Allergies  Allergen Reactions  . Aspirin Other (See Comments)    Makes blood too thin  . Codeine Nausea Only    Family History  Problem Relation Age of Onset  . Mental illness Mother   . Alzheimer's disease Mother   . Cancer Father   . Bone cancer Father   . Dementia Sister     Prior to Admission medications   Medication Sig Start Date End Date Taking? Authorizing Provider  alendronate (FOSAMAX) 70 MG tablet Take 70 mg by mouth once a week. Take with a full glass of water on an empty stomach. On Monday   Yes Historical Provider, MD  ALPRAZolam Heather Floyd) 1 MG tablet Take 1 tablet (1 mg total) by mouth every 8 (eight) hours as needed for anxiety. Patient taking differently: Take 0.5 mg by mouth every 8 (eight) hours as needed for anxiety.  09/08/14  Yes [name], MD  ergocalciferol (VITAMIN D2) 50000 UNITS capsule Take 50,000 Units by mouth once a week. Take  on Mondays   Yes Historical Provider, MD  fenofibrate micronized (LOFIBRA) 200 MG capsule Take 200 mg by mouth daily before breakfast.   Yes Historical Provider, MD  lisinopril (PRINIVIL,ZESTRIL) 40 MG tablet Take 40 mg by mouth daily.   Yes Historical Provider, MD  Memantine HCl ER (NAMENDA XR) 28 MG CP24 Take 28 mg by mouth daily. 05/29/14  Yes [name], MD  Multiple Vitamin (MULTIVITAMIN WITH MINERALS) TABS tablet Take 1 tablet by mouth every morning.   Yes Historical Provider, MD  sertraline (ZOLOFT) 25 MG tablet One tablet daily for 2  weeks, the take 2 tablets daily Patient taking differently: Take 25 mg by mouth daily. One tablet daily for 2 weeks, the take 2 tablets daily 09/08/14  Yes [name], MD  simvastatin (ZOCOR) 40 MG tablet Take 40 mg by mouth daily.   Yes Historical Provider, MD  traMADol (ULTRAM) 50 MG tablet Take one tablet by mouth every 6 hours as needed for mild pain; Take two tablets by mouth every 6 hours as needed for severe pain. Patient not taking: Reported on 09/30/2014 08/21/14   Heather Balo, DO   Physical Exam: Filed Vitals:   09/30/14 1200 09/30/14 1215 09/30/14 1217 09/30/14 1230  BP: 130/65 132/69  147/78  Pulse: 59 65  65  Temp:   97.8 F (36.6 C)   TempSrc:      Resp: 16 10  18   SpO2: 97% 99%  99%   On exam-pleasantly demented By temporalis wasting, supraclavicular wasting, Dry mucosa S1-S2 no murmur rub or gallop Abdomen soft nontender nondistended Follows commands moving all 4 limbs equally Finger-nose-finger test within normal limits, no dysdiadochokinesia Gait not assessed Reflexes in knees and brachioradialis 2/3 Sensory deferred   Labs on Admission:  Basic Metabolic Panel:  Recent Labs Lab 09/30/14 1135  NA 143  K 2.7*  CL 100  CO2 34*  GLUCOSE 112*  BUN 10  CREATININE 0.82  CALCIUM 9.3   Liver Function Tests:  Recent Labs Lab 09/30/14 1135  AST 24  ALT 14  ALKPHOS 49  BILITOT 0.7  PROT 6.1  ALBUMIN 3.7   No results for input(s): LIPASE, AMYLASE in the last 168 hours. No results for input(s): AMMONIA in the last 168 hours. CBC:  Recent Labs Lab 09/30/14 1135  WBC 5.3  NEUTROABS 3.7  HGB 12.7  HCT 37.4  MCV 88.8  PLT 213   Cardiac Enzymes: No results for input(s): CKTOTAL, CKMB, CKMBINDEX, TROPONINI in the last 168 hours.  BNP (last 3 results) No results for input(s): BNP in the last 8760 hours.  ProBNP (last 3 results) No results for input(s): PROBNP in the last 8760 hours.  CBG:  Recent Labs Lab 09/30/14 1150  GLUCAP 139*     Radiological Exams on Admission: Dg Chest 2 View  09/30/2014   CLINICAL DATA:  Altered mental status. History of hypertension and TIA.  EXAM: CHEST  2 VIEW  COMPARISON:  08/13/2014  FINDINGS: Cardiac silhouette is borderline enlarged. Normal mediastinal and hilar contours.  Mild opacity in the left anterior lateral lung base is stable consistent with atelectasis no lung consolidation or edema. No pleural effusion or pneumothorax.  Bony thorax is intact.  IMPRESSION: No acute cardiopulmonary disease.   Electronically Signed   By: Amie Portland M.D.   On: 09/30/2014 12:54   Ct Head (brain) Wo Contrast  09/30/2014   CLINICAL DATA:  Lethargy with slurred speech  EXAM: CT HEAD WITHOUT CONTRAST  TECHNIQUE: Contiguous axial images were obtained from the base of the skull through the vertex without intravenous contrast.  COMPARISON:  08/12/2014  FINDINGS: The bony calvarium is intact. No gross soft tissue abnormality is noted. Atrophic changes are seen. Mild chronic white matter ischemic changes noted. No findings to suggest acute hemorrhage are noted. The previously seen right subdural hematoma is no longer identified.  IMPRESSION: Mild atrophic changes and chronic white matter ischemic change. No acute hemorrhage is identified.  These results were called by telephone at the time of interpretation on 09/30/2014 at 11:55 am to Dr. Leroy Kennedy, who verbally acknowledged these results.   Electronically Signed   By: Alcide Clever M.D.   On: 09/30/2014 11:56   Mr Brain Wo Contrast  09/30/2014   CLINICAL DATA:  81 year old female who fell in February with right side subdural hematoma. Acute confusion, Lethargy and slurred speech. Initial encounter.  EXAM: MRI HEAD WITHOUT CONTRAST  TECHNIQUE: Multiplanar, multiecho pulse sequences of the brain and surrounding structures were obtained without intravenous contrast.  COMPARISON:  Head CT without contrast 1150 hours today and earlier. Brain MRI 01/14/2013.  FINDINGS: The  examination had to be discontinued prior to completion due to patient agitation.  The small right side subdural hematoma has resolved since February. No extra-axial collection identified today. No acute intracranial hemorrhage identified. No midline shift, mass effect, or evidence of intracranial mass lesion. No ventriculomegaly.  There is T2 shine through in the right corona radiata related to a chronic T2 and FLAIR hyperintense white matter lesion which is stable since 2014. No restricted diffusion or evidence of acute infarction. Chronic confluent T2 hyperintensity in the globus pallidus. Major intracranial vascular flow voids are stable. No acute signal abnormality identified. No cortical encephalomalacia. Negative pituitary, cervicomedullary junction and visualized cervical spine. Bone marrow signal within normal limits. Stable orbits soft tissues, paranasal sinuses and mastoids.  IMPRESSION: The small right subdural hematoma has resolved since February.  No acute intracranial abnormality.  The examination had to be discontinued prior to completion.   Electronically Signed   By: Odessa Fleming M.D.   On: 09/30/2014 13:54     Assessment/Plan Principal Problem:   TIA (transient ischemic attack)-completed stroke pathway in terms of getting MRI brain which has just been done. We will admit under stroke order set Patient was not a candidate for TPA given nonfocal findings as well as quick resolution of findings prior to being seen by EDP We will get therapy services to see the patient and evaluate the patient however her daughter seems to think patient may benefit from being at home as she has significant dementia underlying and gets agitated at night EEG has been ordered by neurologist    Accelerated hypertension blood pressure is actually reasonably controlled if MRI is negative can continue lisinopril-nursing to let M.D. no if MRI turns out to be positive. Continue IV fluid for about 12 hours and then  discontinue in a.m.   Significant hypokalemia-potassium 2.7-given one run of potassium 10 as well as K Dur 40 mEq by emergency room. Recheck labs in a.m.   Subdural hematoma-CT head does not show anything specific from recent admission   RLS (restless legs syndrome)   Carotid artery occlusion-repeat carotids today   Essential hypertension   Abnormality of gait- at baseline uses a walker typically , therapy services as above     discussed with healthcare power of attorney at the bedside,   presumed full code   inpatient pending resolution  Time  spent: 45 minutes   Mahala MenghiniSAMTANI, [facility] Stand[facility] HospitalJAI-GURMUKH [facility] Pager [phone]  7PM-7AM, please contact night-coverage [email or web address] Password [email or web address] 09/30/2014, 2:03 PM

## 2014-09-30 NOTE — ED Notes (Signed)
Patient woke up at 9:30AM this morning and was at baseline, at 9:45 she was seen by daughter and noted to be non-responsive, but breathing normally for approximately 5 minutes, then patient became responsive to touch, but was confused and could not remember daughters name. Family noted patient was very lethargic and glassy-eyed and had slurred speech. Family did not notice facial droop, or weakness specific to one side. Patient is now able to recognize daughter and self, but family sts seems to be still mildly confused with mild slurred speech.

## 2014-09-30 NOTE — Evaluation (Signed)
Physical Therapy Evaluation and Discharge Patient Details Name: Heather PandaGail M Behrle MRN: [id number] DOB: 06/06/1933 Today's Date: 09/30/2014   History of Present Illness  81 y.o. female admitted for TIA (transient ischemic attack). Found to have hypokalemia.  Clinical Impression  Patient evaluated by Physical Therapy with no further acute PT needs identified. All education has been completed and the patient has no further questions. Ambulates generally well with supervision, no overt loss of balance noted with ambulation or transfers. Reported some dizziness upon standing, states this is baseline and daughter confirms. Dizziness resolved shortly after standing. SpO2 100% on room air, HR in 60s. Patient already has 24 hour supervision at home. See below for any follow-up Physial Therapy or equipment needs. PT is signing off. Thank you for this referral.     Follow Up Recommendations No PT follow up    Equipment Recommendations  None recommended by PT    Recommendations for Other Services       Precautions / Restrictions Precautions Precautions: Fall Restrictions Weight Bearing Restrictions: No      Mobility  Bed Mobility Overal bed mobility: Modified Independent                Transfers Overall transfer level: Needs assistance Equipment used: None Transfers: Sit to/from Stand Sit to Stand: Supervision         General transfer comment: supervision for safety. Pt states she feels mildy dizzy but resolved quickly. Reaches for IV pole for support but no sway noted. States dizziness is baseline upon standing and daughter confirms.  Ambulation/Gait Ambulation/Gait assistance: Supervision Ambulation Distance (Feet): 165 Feet Assistive device:  (Pushing IV pole) Gait Pattern/deviations: Step-through pattern;Decreased stride length     General Gait Details: Pt takes quick, shortened steps and ambulates generally well, pushing an IV pole for support. She reported some blurred  vision initially but states this resolved shortly into bout. Pt did not demonstrate any loss of balance, buckling of LEs, or drifting during ambulatory bout.   Stairs            Wheelchair Mobility    Modified Rankin (Stroke Patients Only) Modified Rankin (Stroke Patients Only) Pre-Morbid Rankin Score: Moderate disability Modified Rankin: Moderately severe disability     Balance Overall balance assessment: Needs assistance;History of Falls Sitting-balance support: No upper extremity supported;Feet supported Sitting balance-Leahy Scale: Good     Standing balance support: No upper extremity supported Standing balance-Leahy Scale: Fair                               Pertinent Vitals/Pain Pain Assessment: Faces Faces Pain Scale: Hurts little more Pain Location: "everything is falling apart at my age" Pain Intervention(s): Monitored during session;Repositioned    Home Living   Living Arrangements: Children Available Help at Discharge: Family;Available 24 hours/day;Personal care attendant (Daughter stays [phone] - sitter during day) Type of Home: House Home Access: Stairs to enter Entrance Stairs-Rails: None Entrance Stairs-Number of Steps: 2 Home Layout: One level Home Equipment: Walker - 2 wheels;Cane - single point;Bedside commode;Shower seat      Prior Function Level of Independence: Independent with assistive device(s)         Comments: cane for mobility, able to bath/dress self     Hand Dominance   Dominant Hand: Right    Extremity/Trunk Assessment   Upper Extremity Assessment: Defer to OT evaluation (Normal finger-to-nose test)           Lower Extremity  Assessment: Overall WFL for tasks assessed         Communication   Communication: No difficulties  Cognition Arousal/Alertness: Awake/alert Behavior During Therapy: WFL for tasks assessed/performed Overall Cognitive Status: History of cognitive impairments - at baseline  (Hyperverbal)                      General Comments General comments (skin integrity, edema, etc.): Spoke with patient and family about modifable risk factors for stroke prevention including diet and exercise. Daughter very supportive and family has arranged for 24 hour supervision. Daughter states her mother functional abilites and cognition have returned to baseline as far as she can tell.    Exercises        Assessment/Plan    PT Assessment Patent does not need any further PT services  PT Diagnosis Difficulty walking;Abnormality of gait   PT Problem List Decreased activity tolerance;Decreased balance;Decreased mobility;Decreased cognition;Decreased safety awareness;Pain  PT Treatment Interventions     PT Goals (Current goals can be found in the Care Plan section) Acute Rehab PT Goals Patient Stated Goal: none stated PT Goal Formulation: All assessment and education complete, DC therapy    Frequency     Barriers to discharge        Co-evaluation               End of Session   Activity Tolerance: Patient tolerated treatment well Patient left: in chair;with call bell/phone within reach;with chair alarm set;with family/visitor present Nurse Communication: Mobility status         Time: 1610-9604 PT Time Calculation (min) (ACUTE ONLY): 29 min   Charges:   PT Evaluation $Initial PT Evaluation Tier I: 1 Procedure PT Treatments $Gait Training: 8-22 mins   PT G CodesBerton Mount 09/30/2014, 5:06 PM Sunday [place], [place] [phone]

## 2014-09-30 NOTE — ED Notes (Signed)
CALLED [facility] FOR CODE STROKE FOR DR. Clarene DukeMCMANUS- SPOKE WITH ANDY.

## 2014-09-30 NOTE — ED Notes (Signed)
Patient returned to room from MRI

## 2014-09-30 NOTE — Progress Notes (Signed)
D/w Dr. Cyril Mourningamillo  MRI by his over-read of this shows no specific anomaly--doesn;t think further work-up is  Needed at current time however patient might need EEG  Patient will he seen by Dr. Cyril Mourningamillo earlier in the day tomorrow and if still felt prudent to get EEG, patient should should stay to get this done which may be Monday the 18th otherwise can be discharged 4/70  Pleas KochJai Candis Kabel, MD [facility] [phone]

## 2014-09-30 NOTE — Progress Notes (Signed)
Received from ER via stretcher; patient is alert; disoriented to dates and time; aware of situation and oriented to self and her family members; oriented patient and family to room and unit routine.

## 2014-10-01 DIAGNOSIS — E875 Hyperkalemia: Secondary | ICD-10-CM

## 2014-10-01 DIAGNOSIS — G459 Transient cerebral ischemic attack, unspecified: Principal | ICD-10-CM

## 2014-10-01 LAB — BASIC METABOLIC PANEL
ANION GAP: 11 (ref 5–15)
BUN: 8 mg/dL (ref 6–23)
CO2: 26 mmol/L (ref 19–32)
CREATININE: 0.69 mg/dL (ref 0.50–1.10)
Calcium: 8.9 mg/dL (ref 8.4–10.5)
Chloride: 105 mmol/L (ref 96–112)
GFR calc Af Amer: >90 mL/min (ref >90)
GFR calc non Af Amer: 81 mL/min — ABNORMAL LOW (ref >90)
Glucose, Bld: 91 mg/dL (ref 70–99)
Potassium: 3 mmol/L — ABNORMAL LOW (ref 3.5–5.1)
Sodium: 142 mmol/L (ref 135–145)

## 2014-10-01 LAB — LIPID PANEL
CHOL/HDL RATIO: 6.1 ratio
Cholesterol: 122 mg/dL (ref 0–200)
HDL: 20 mg/dL — ABNORMAL LOW (ref >39)
LDL Cholesterol: 64 mg/dL (ref 0–99)
TRIGLYCERIDES: 188 mg/dL — AB (ref <150)
VLDL: 38 mg/dL (ref 0–40)

## 2014-10-01 MED ORDER — POTASSIUM CHLORIDE CRYS ER 20 MEQ PO TBCR
40.0000 meq | EXTENDED_RELEASE_TABLET | Freq: Two times a day (BID) | ORAL | Status: DC
  Administered 2014-10-01: 40 meq via ORAL
  Filled 2014-10-01: qty 2

## 2014-10-01 NOTE — Progress Notes (Signed)
Patient ready for discharge to home; discharge instructions given and reviewed with patient and her daughter; discharged via wheelchair; taken home by her daughter.

## 2014-10-01 NOTE — Discharge Summary (Signed)
Physician Discharge Summary  Heather Floyd ZOX:[id number] DOB: 01/27/1933 DOA: 09/30/2014  PCP: Gaye AlkenBARNES,ELIZABETH STEWART, MD  Admit date: 09/30/2014 Discharge date: 10/01/2014  Time spent: 20 minutes  Recommendations for Outpatient Follow-up:  1. Follow up with PCP in 1-2 weeks 2. Would arrange follow up with Neurology at next available appointment  Discharge Diagnoses:  Principal Problem:   TIA (transient ischemic attack) Active Problems:   Accelerated hypertension   Hyperkalemia   Subdural hematoma   RLS (restless legs syndrome)   Carotid artery occlusion   SDH (subdural hematoma)   Essential hypertension   Abnormality of gait   Discharge Condition: Stable  Diet recommendation: Heart healthy  Filed Weights   09/30/14 1700  Weight: 53.524 kg (118 lb)    History of present illness:  Please see admit h and p from 4/16 for details. Briefly, pt presents with weakness and slurred speech. Pt was admitted for further work up.  Hospital Course:  Pt was admitted to the floor. Neurology was consulted with initial concerns for possible CVA. MRI was found to be unremarkable with Neurology recommendations for no further stroke work up. Pt has since returned to baseline and cleared for discharge with follow up with Neurology as outpatient for recommended outpatient EEG.  Consultations:  Neurology  Discharge Exam: Filed Vitals:   09/30/14 2053 10/01/14 0202 10/01/14 0601 10/01/14 1008  BP: 158/70 157/70 174/66 155/72  Pulse: 70 66 74 74  Temp: 98.5 F (36.9 C) 98.3 F (36.8 C) 97.6 F (36.4 C) 98.5 F (36.9 C)  TempSrc: Oral Oral Oral Oral  Resp: 20 20 20 18   Height:      Weight:      SpO2: 99% 96% 98% 96%    General: Awake, in nad Cardiovascular: regular,s 1, s2 Respiratory: normal resp effort, no wheezing  Discharge Instructions     Medication List    STOP taking these medications        traMADol 50 MG tablet  Commonly known as:  ULTRAM      TAKE these  medications        alendronate 70 MG tablet  Commonly known as:  FOSAMAX  Take 70 mg by mouth once a week. Take with a full glass of water on an empty stomach. On Monday     ALPRAZolam 1 MG tablet  Commonly known as:  XANAX  Take 1 tablet (1 mg total) by mouth every 8 (eight) hours as needed for anxiety.     ergocalciferol 50000 UNITS capsule  Commonly known as:  VITAMIN D2  Take 50,000 Units by mouth once a week. Take on Mondays     fenofibrate micronized 200 MG capsule  Commonly known as:  LOFIBRA  Take 200 mg by mouth daily before breakfast.     lisinopril 40 MG tablet  Commonly known as:  PRINIVIL,ZESTRIL  Take 40 mg by mouth daily.     memantine 28 MG Cp24 24 hr capsule  Commonly known as:  NAMENDA XR  Take 28 mg by mouth daily.     multivitamin with minerals Tabs tablet  Take 1 tablet by mouth every morning.     sertraline 25 MG tablet  Commonly known as:  ZOLOFT  One tablet daily for 2 weeks, the take 2 tablets daily     simvastatin 40 MG tablet  Commonly known as:  ZOCOR  Take 40 mg by mouth daily.       Allergies  Allergen Reactions  . Aspirin Other (See Comments)  Makes blood too thin  . Codeine Nausea Only   Follow-up Information    Follow up with [name], MD. Schedule an appointment as soon as possible for a visit in 1 week.   Specialty:  Family Medicine   Why:  Hospital follow up   Contact information:   [address] [place] [address] [phone]       Schedule an appointment as soon as possible for a visit with [facility].   Why:  Hospital follow up   Contact information:   [address] [place] [address] [phone]       The results of significant diagnostics from this hospitalization (including imaging, microbiology, ancillary and laboratory) are listed below for reference.    Significant Diagnostic Studies: Dg Chest 2 View  09/30/2014   CLINICAL DATA:   Altered mental status. History of hypertension and TIA.  EXAM: CHEST  2 VIEW  COMPARISON:  08/13/2014  FINDINGS: Cardiac silhouette is borderline enlarged. Normal mediastinal and hilar contours.  Mild opacity in the left anterior lateral lung base is stable consistent with atelectasis no lung consolidation or edema. No pleural effusion or pneumothorax.  Bony thorax is intact.  IMPRESSION: No acute cardiopulmonary disease.   Electronically Signed   By: Amie Portland M.D.   On: 09/30/2014 12:54   Ct Head (brain) Wo Contrast  09/30/2014   CLINICAL DATA:  Lethargy with slurred speech  EXAM: CT HEAD WITHOUT CONTRAST  TECHNIQUE: Contiguous axial images were obtained from the base of the skull through the vertex without intravenous contrast.  COMPARISON:  08/12/2014  FINDINGS: The bony calvarium is intact. No gross soft tissue abnormality is noted. Atrophic changes are seen. Mild chronic white matter ischemic changes noted. No findings to suggest acute hemorrhage are noted. The previously seen right subdural hematoma is no longer identified.  IMPRESSION: Mild atrophic changes and chronic white matter ischemic change. No acute hemorrhage is identified.  These results were called by telephone at the time of interpretation on 09/30/2014 at 11:55 am to Dr. Leroy Kennedy, who verbally acknowledged these results.   Electronically Signed   By: Alcide Clever M.D.   On: 09/30/2014 11:56   Mr Brain Wo Contrast  09/30/2014   CLINICAL DATA:  81 year old female who fell in February with right side subdural hematoma. Acute confusion, Lethargy and slurred speech. Initial encounter.  EXAM: MRI HEAD WITHOUT CONTRAST  TECHNIQUE: Multiplanar, multiecho pulse sequences of the brain and surrounding structures were obtained without intravenous contrast.  COMPARISON:  Head CT without contrast 1150 hours today and earlier. Brain MRI 01/14/2013.  FINDINGS: The examination had to be discontinued prior to completion due to patient agitation.  The small  right side subdural hematoma has resolved since February. No extra-axial collection identified today. No acute intracranial hemorrhage identified. No midline shift, mass effect, or evidence of intracranial mass lesion. No ventriculomegaly.  There is T2 shine through in the right corona radiata related to a chronic T2 and FLAIR hyperintense white matter lesion which is stable since 2014. No restricted diffusion or evidence of acute infarction. Chronic confluent T2 hyperintensity in the globus pallidus. Major intracranial vascular flow voids are stable. No acute signal abnormality identified. No cortical encephalomalacia. Negative pituitary, cervicomedullary junction and visualized cervical spine. Bone marrow signal within normal limits. Stable orbits soft tissues, paranasal sinuses and mastoids.  IMPRESSION: The small right subdural hematoma has resolved since February.  No acute intracranial abnormality.  The examination had to be discontinued  prior to completion.   Electronically Signed   By: Odessa Fleming M.D.   On: 09/30/2014 13:54    Microbiology: No results found for this or any previous visit (from the past 240 hour(s)).   Labs: Basic Metabolic Panel:  Recent Labs Lab 09/30/14 1135 09/30/14 1422 10/01/14 0817  NA 143  --  142  K 2.7*  --  3.0*  CL 100  --  105  CO2 34*  --  26  GLUCOSE 112*  --  91  BUN 10  --  8  CREATININE 0.82  --  0.69  CALCIUM 9.3  --  8.9  MG  --  1.9  --    Liver Function Tests:  Recent Labs Lab 09/30/14 1135  AST 24  ALT 14  ALKPHOS 49  BILITOT 0.7  PROT 6.1  ALBUMIN 3.7   No results for input(s): LIPASE, AMYLASE in the last 168 hours. No results for input(s): AMMONIA in the last 168 hours. CBC:  Recent Labs Lab 09/30/14 1135  WBC 5.3  NEUTROABS 3.7  HGB 12.7  HCT 37.4  MCV 88.8  PLT 213   Cardiac Enzymes: No results for input(s): CKTOTAL, CKMB, CKMBINDEX, TROPONINI in the last 168 hours. BNP: BNP (last 3 results) No results for input(s):  BNP in the last 8760 hours.  ProBNP (last 3 results) No results for input(s): PROBNP in the last 8760 hours.  CBG:  Recent Labs Lab 09/30/14 1150 09/30/14 1652  GLUCAP 139* 110*   Signed:  Viana Sleep K  [facility] 10/01/2014, 10:46 AM

## 2014-10-01 NOTE — Progress Notes (Signed)
NEURO HOSPITALIST PROGRESS NOTE   SUBJECTIVE:                                                                                                                        Eating breakfast. York SpanielSaid that she is doing really well today but doesn't know what happened to her yesterday that prompted her family to bring her to the hospital. MRI brain was personally reviewed and showed no acute abnormality. EEG pending. UA negative for infection, UDS +benzodiazepines. Metabolic panel today pending.    OBJECTIVE:                                                                                                                           Vital signs in last 24 hours: Temp:  [97.5 F (36.4 C)-98.5 F (36.9 C)] 97.6 F (36.4 C) (04/17 0601) Pulse Rate:  [59-90] 74 (04/17 0601) Resp:  [10-20] 20 (04/17 0601) BP: (122-174)/(56-87) 174/66 mmHg (04/17 0601) SpO2:  [87 %-100 %] 98 % (04/17 0601) Weight:  [53.524 kg (118 lb)] 53.524 kg (118 lb) (04/16 1700)  Intake/Output from previous day: 04/16 0701 - 04/17 0700 In: 240 [P.O.:240] Out: -  Intake/Output this shift:   Nutritional status: Diet Heart Room service appropriate?: Yes; Fluid consistency:: Thin  Past Medical History  Diagnosis Date  . Hyperlipidemia   . Hypertension   . RLS (restless legs syndrome)   . Sleep disorder   . Personal history of colonic polyps   . Osteoporosis   . DJD (degenerative joint disease) of cervical spine   . Varicose veins   . TIA (transient ischemic attack)   . Carotid artery occlusion   . Memory disturbance 03/29/2013  . Abnormality of gait 09/08/2014  . Concussion with loss of consciousness 09/08/2014  Physical exam: pleasantly confused female in no apparent distress. Head: normocephalic. Neck: supple, no bruits, no JVD. Cardiac: no murmurs. Lungs: clear. Abdomen: soft, no tender, no mass. Extremities: no edema. Pulses: palpable Skin: no rash  Neurologic Exam:  Mental  Status: Alert, awake, oriented to place but disoriented to year-month. Speech normal but without evidence of aphasia. Able to follow 3 step commands without difficulty. Cranial Nerves: II: Discs flat bilaterally; Visual Kuhnle grossly normal, pupils equal, round, reactive  to light and accommodation III,IV, VI: ptosis not present, extra-ocular motions intact bilaterally V,VII: smile symmetric, facial light touch sensation normal bilaterally VIII: hearing normal bilaterally IX,X: uvula rises symmetrically XI: bilateral shoulder shrug XII: midline tongue extension without atrophy or fasciculations Motor: Moves all limbs symmetrically, no obvious weakness. Tone and bulk:normal tone throughout; no atrophy noted Sensory: Pinprick and light touch intact throughout, bilaterally Deep Tendon Reflexes:  1+ all over Plantars: Right: downgoingLeft: downgoing Cerebellar: normal finger-to-nose, normal heel-to-shin test Gait:  Unable to test at this moment.   Lab Results: Lab Results  Component Value Date/Time   CHOL  06/08/2010 05:34 AM    102        ATP III CLASSIFICATION:  <200     mg/dL   Desirable  540-981  mg/dL   Borderline High  >=191    mg/dL   High          Lipid Panel No results for input(s): CHOL, TRIG, HDL, CHOLHDL, VLDL, LDLCALC in the last 72 hours.  Studies/Results: Dg Chest 2 View  09/30/2014   CLINICAL DATA:  Altered mental status. History of hypertension and TIA.  EXAM: CHEST  2 VIEW  COMPARISON:  08/13/2014  FINDINGS: Cardiac silhouette is borderline enlarged. Normal mediastinal and hilar contours.  Mild opacity in the left anterior lateral lung base is stable consistent with atelectasis no lung consolidation or edema. No pleural effusion or pneumothorax.  Bony thorax is intact.  IMPRESSION: No acute cardiopulmonary disease.   Electronically Signed   By: Amie Portland M.D.   On: 09/30/2014 12:54   Ct Head (brain) Wo  Contrast  09/30/2014   CLINICAL DATA:  Lethargy with slurred speech  EXAM: CT HEAD WITHOUT CONTRAST  TECHNIQUE: Contiguous axial images were obtained from the base of the skull through the vertex without intravenous contrast.  COMPARISON:  08/12/2014  FINDINGS: The bony calvarium is intact. No gross soft tissue abnormality is noted. Atrophic changes are seen. Mild chronic white matter ischemic changes noted. No findings to suggest acute hemorrhage are noted. The previously seen right subdural hematoma is no longer identified.  IMPRESSION: Mild atrophic changes and chronic white matter ischemic change. No acute hemorrhage is identified.  These results were called by telephone at the time of interpretation on 09/30/2014 at 11:55 am to Dr. Leroy Kennedy, who verbally acknowledged these results.   Electronically Signed   By: Alcide Clever M.D.   On: 09/30/2014 11:56   Mr Brain Wo Contrast  09/30/2014   CLINICAL DATA:  81 year old female who fell in February with right side subdural hematoma. Acute confusion, Lethargy and slurred speech. Initial encounter.  EXAM: MRI HEAD WITHOUT CONTRAST  TECHNIQUE: Multiplanar, multiecho pulse sequences of the brain and surrounding structures were obtained without intravenous contrast.  COMPARISON:  Head CT without contrast 1150 hours today and earlier. Brain MRI 01/14/2013.  FINDINGS: The examination had to be discontinued prior to completion due to patient agitation.  The small right side subdural hematoma has resolved since February. No extra-axial collection identified today. No acute intracranial hemorrhage identified. No midline shift, mass effect, or evidence of intracranial mass lesion. No ventriculomegaly.  There is T2 shine through in the right corona radiata related to a chronic T2 and FLAIR hyperintense white matter lesion which is stable since 2014. No restricted diffusion or evidence of acute infarction. Chronic confluent T2 hyperintensity in the globus pallidus. Major  intracranial vascular flow voids are stable. No acute signal abnormality identified. No cortical encephalomalacia. Negative pituitary, cervicomedullary junction  and visualized cervical spine. Bone marrow signal within normal limits. Stable orbits soft tissues, paranasal sinuses and mastoids.  IMPRESSION: The small right subdural hematoma has resolved since February.  No acute intracranial abnormality.  The examination had to be discontinued prior to completion.   Electronically Signed   By: Odessa Fleming M.D.   On: 09/30/2014 13:54    MEDICATIONS                                                                                                                        Scheduled: . atorvastatin  40 mg Oral q1800  . clopidogrel  75 mg Oral Daily  . fenofibrate  160 mg Oral Daily  . heparin  5,000 Units Subcutaneous 3 times per day  . lisinopril  20 mg Oral Daily  . memantine  28 mg Oral Daily  . multivitamin with minerals  1 tablet Oral q morning - 10a  . sertraline  25 mg Oral Daily  . Vitamin D (Ergocalciferol)  50,000 Units Oral Q7 days    ASSESSMENT/PLAN:                                                                                                            81 y.o. female with HTN, hyperlipidemia, carotid artery occlusion, TIA, significant fall 08/10/14 resulting in small right parietal post traumatic SHD, and dementia, brought in by family for further evaluation of " glassy eyed appearance, "drugged" appearance, dysarthria and increasing confusion and disorientation for which patient has not recollection. MRI brain without acute abnormality. Possible partial seizure with impairment of awareness, paroxysmal event related to underlying dementia. I am not quite convinced she had a  TIA thus will not pursue TIA work up at this time.. She seems to be back to her baseline, no focal neurological findings. Unsure EEG will be feasible today, but this can be arrange as outpatient. Neuro will sign  off.  Wyatt Portela, MD [facility] [phone]  10/01/2014, 8:14 AM

## 2014-10-02 LAB — URINE CULTURE
COLONY COUNT: NO GROWTH
CULTURE: NO GROWTH

## 2014-10-02 LAB — HEMOGLOBIN A1C
Hgb A1c MFr Bld: 5.3 % (ref 4.8–5.6)
Mean Plasma Glucose: 105 mg/dL

## 2014-10-02 NOTE — Progress Notes (Signed)
UR COMPLETED  

## 2014-10-03 ENCOUNTER — Telehealth: Payer: Self-pay | Admitting: Neurology

## 2014-10-03 DIAGNOSIS — R404 Transient alteration of awareness: Secondary | ICD-10-CM

## 2014-10-03 NOTE — Telephone Encounter (Signed)
i will order the EEG.

## 2014-10-03 NOTE — Telephone Encounter (Signed)
Pt's daughter is calling stating pt needs to be set up for an EEG following her hospital discharge.  Does there need to be a order in the system for this?  Please advise.

## 2014-10-03 NOTE — Telephone Encounter (Signed)
I called patient's daughter. Patient went to the ED with signs of a stroke. Per daughter, they determined the patient had a TIA and wanted to do an EEG, but the patient was very upset in the hospital, so the doctors told her she could do the EEG as an outpatient.

## 2014-10-06 ENCOUNTER — Telehealth: Payer: Self-pay | Admitting: Neurology
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
## 2014-10-08 ENCOUNTER — Other Ambulatory Visit: Payer: Self-pay | Admitting: Adult Health

## 2014-10-12 ENCOUNTER — Other Ambulatory Visit: Payer: Medicare Other | Admitting: Radiology

## 2014-10-13 ENCOUNTER — Other Ambulatory Visit: Payer: Medicare Other | Admitting: Radiology

## 2015-01-22 ENCOUNTER — Encounter: Payer: Self-pay | Admitting: Nurse Practitioner

## 2015-01-22 ENCOUNTER — Telehealth: Payer: Self-pay | Admitting: Neurology

## 2015-01-22 ENCOUNTER — Ambulatory Visit (INDEPENDENT_AMBULATORY_CARE_PROVIDER_SITE_OTHER): Payer: Medicare Other | Admitting: Nurse Practitioner

## 2015-01-22 VITALS — BP 189/92 | HR 88 | Ht 62.0 in | Wt 107.0 lb

## 2015-01-22 DIAGNOSIS — R413 Other amnesia: Secondary | ICD-10-CM | POA: Diagnosis not present

## 2015-01-22 DIAGNOSIS — S060X1D Concussion with loss of consciousness of 30 minutes or less, subsequent encounter: Secondary | ICD-10-CM | POA: Diagnosis not present

## 2015-01-22 MED ORDER — MEMANTINE HCL ER 7 & 14 & 21 &28 MG PO CP24: ORAL_CAPSULE | ORAL | Status: DC

## 2015-01-22 NOTE — Telephone Encounter (Signed)
Rx has been resent.  Receipt confirmed by pharmacy.   

## 2015-01-22 NOTE — Patient Instructions (Signed)
Restart Namenda with Starter pack then  daily Continue Zoloft daily Concerned for safety living alone F/U 6 months

## 2015-01-22 NOTE — Progress Notes (Signed)
I have read the note, and I agree with the clinical assessment and plan.  Chanson Teems KEITH   

## 2015-01-22 NOTE — Telephone Encounter (Signed)
Joe with [employer] states he did not receive patient's Rx Namenda XR Titration. Could you please resend.  Thanks!

## 2015-01-22 NOTE — Progress Notes (Signed)
[facility]  PATIENT: Heather Floyd DOB: 1932-07-11   REASON FOR VISIT: Follow-up for memory loss, history of concussion HISTORY FROM: Patient, daughter and son    HISTORY OF PRESENT ILLNESS:Heather Floyd is a 81 year old right-handed white female with a history of a progressive memory disorder. The patient was last seen by Dr. [name] 09/08/2014 following a significant fall that occurred on 08/10/2014. The patient struck her head, and she sustained a concussion with a small subdural hematoma noted in the medial parietal area. The hematoma has resolved according to MRI of the brain  dated 09/30/2014.  The patient is currently residing with  her daughter who works full time . She continues with  worsening cognitive functioning.  The patient is having more problems with confusion, particularly with sundowning at night. She is having agitation issues. She is sleeping well when she does get to sleep, but she has been given 1 mg tablets of Xanax to use at night and sometimes during the day as well for agitation. She stopped her Namenda 3 months ago because the family says she had no more refills and they could not get a response from our office however according to the records she has refills  until December. The family is concerned about safety issues when  the daughter goes to work. She is not eating well. She continues to drive even though she has been told she should not. The daughter is the power of attorney and has looked into a memory unit. The patient has a DO NOT RESUSCITATE order. She returns for reevaluation   REVIEW OF SYSTEMS: Full 14 system review of systems performed and notable only for those listed, all others are neg:  Constitutional: Appetite change Cardiovascular: neg Ear/Nose/Throat: Hearing loss Skin: neg Eyes: neg Respiratory: neg Gastroitestinal: neg  Hematology/Lymphatic: neg  Endocrine: neg Musculoskeletal:neg Allergy/Immunology: neg Neurological:  Memory loss Psychiatric: Depression and anxiety and nervousness Sleep : neg   ALLERGIES: Allergies  Allergen Reactions  . Aspirin Other (See Comments)    Makes blood too thin  . Codeine Nausea Only    HOME MEDICATIONS: Outpatient Prescriptions Prior to Visit  Medication Sig Dispense Refill  . alendronate (FOSAMAX) 70 MG tablet Take 70 mg by mouth once a week. Take with a full glass of water on an empty stomach. On Monday    . ALPRAZolam (XANAX) 1 MG tablet Take 1 tablet (1 mg total) by mouth every 8 (eight) hours as needed for anxiety. (Patient taking differently: Take 0.5 mg by mouth every 8 (eight) hours as needed for anxiety. ) 60 tablet 3  . ergocalciferol (VITAMIN D2) 50000 UNITS capsule Take 50,000 Units by mouth once a week. Take on Mondays    . fenofibrate micronized (LOFIBRA) 200 MG capsule Take 200 mg by mouth daily before breakfast.    . lisinopril (PRINIVIL,ZESTRIL) 40 MG tablet Take 40 mg by mouth daily.    . Multiple Vitamin (MULTIVITAMIN WITH MINERALS) TABS tablet Take 1 tablet by mouth every morning.    . sertraline (ZOLOFT) 25 MG tablet One tablet daily for 2 weeks, the take 2 tablets daily (Patient taking differently: Take 25 mg by mouth daily. One tablet daily for 2 weeks, the take 2 tablets daily) 60 tablet 3  . simvastatin (ZOCOR) 40 MG tablet Take 40 mg by mouth daily.    . Memantine HCl ER (NAMENDA XR) 28 MG CP24 Take 28 mg by mouth daily. (Patient not taking: Reported on 01/22/2015) 30 capsule 11  No facility-administered medications prior to visit.    PAST MEDICAL HISTORY: Past Medical History  Diagnosis Date  . Hyperlipidemia   . Hypertension   . RLS (restless legs syndrome)   . Sleep disorder   . Personal history of colonic polyps   . Osteoporosis   . DJD (degenerative joint disease) of cervical spine   . Varicose veins   . TIA (transient ischemic attack)   . Carotid artery occlusion   . Memory disturbance 03/29/2013  . Abnormality of gait  09/08/2014  . Concussion with loss of consciousness 09/08/2014    PAST SURGICAL HISTORY: Past Surgical History  Procedure Laterality Date  . Anterior and posterior vaginal repair w/ sacrospinous ligament suspension    . Endovenous ablation saphenous vein w/ laser    . Breast reduction surgery    . Cesarean section    . Hemorrhoid surgery      FAMILY HISTORY: Family History  Problem Relation Age of Onset  . Mental illness Mother   . Alzheimer's disease Mother   . Cancer Father   . Bone cancer Father   . Dementia Sister     SOCIAL HISTORY: History   Social History  . Marital Status: Widowed    Spouse Name: N/A  . Number of Children: 3  . Years of Education: 12   Occupational History  . retired    Social History Main Topics  . Smoking status: Former Smoker    Quit date: 06/16/1980  . Smokeless tobacco: Never Used  . Alcohol Use: No  . Drug Use: No  . Sexual Activity: Not on file   Other Topics Concern  . Not on file   Social History Narrative     PHYSICAL EXAM  Filed Vitals:   01/22/15 0848  BP: 189/92  Pulse: 88  Height: 5\' 2"  (1.575 m)  Weight: 107 lb (48.535 kg)   Body mass index is 19.57 kg/(m^2). General: The patient is alert and cooperative at the time of the examination. Skin: No significant peripheral edema is noted.  Neurologic Exam Mental status: The Mini-Mental Status Examination done today shows a total score of 22/30. Animal fluency test score is 5. Clock drawing 0/4.  Cranial nerves: Facial symmetry is present. Speech is normal, no aphasia or dysarthria is noted. Extraocular movements are full. Visual Macgregor are full. Motor: The patient has good strength in all 4 extremities. Sensory examination: Soft touch sensation is symmetric on the face, arms, and legs. Coordination: The patient has good finger-nose-finger and heel-to-shin bilaterally,. Gait and station:  The patient is able to ambulate independently. Romberg is negative. No  assistive device Reflexes: Deep tendon reflexes are symmetric.  DIAGNOSTIC DATA (LABS, IMAGING, TESTING) - I reviewed patient records, labs, notes, testing and imaging myself where available.  Lab Results  Component Value Date   WBC 5.3 09/30/2014   HGB 12.7 09/30/2014   HCT 37.4 09/30/2014   MCV 88.8 09/30/2014   PLT 213 09/30/2014      Component Value Date/Time   NA 142 10/01/2014 0817   K 3.0* 10/01/2014 0817   CL 105 10/01/2014 0817   CO2 26 10/01/2014 0817   GLUCOSE 91 10/01/2014 0817   BUN 8 10/01/2014 0817   CREATININE 0.69 10/01/2014 0817   CALCIUM 8.9 10/01/2014 0817   PROT 6.1 09/30/2014 1135   ALBUMIN 3.7 09/30/2014 1135   AST 24 09/30/2014 1135   ALT 14 09/30/2014 1135   ALKPHOS 49 09/30/2014 1135   BILITOT 0.7 09/30/2014 1135  GFRNONAA 81* 10/01/2014 0817   GFRAA >90 10/01/2014 0817   Lab Results  Component Value Date   CHOL 122 10/01/2014   HDL 20* 10/01/2014   LDLCALC 64 10/01/2014   TRIG 188* 10/01/2014   CHOLHDL 6.1 10/01/2014   Lab Results  Component Value Date   HGBA1C 5.3 09/30/2014    ASSESSMENT AND PLAN  81 y.o. year old female  has a past medical history of memory disturbance and small hematoma due to fall and likely concussion. This occurred February at her most recent CT shows resolution of the hematoma. She continues to have problems with agitation. She has been instructed not to drive. Family wishes to discuss other options  Restart Namenda with Starter pack then 28mg  daily Continue Zoloft daily Concerned for safety living alone during the day I spent 20 minutes in total face to face time with the daughter who is POA more than 50% of which was spent counseling and coordination of care, reviewing test results reviewing medications and discussing and reviewing the diagnosis of memory loss and further treatment options.. Patient should not be driving. Restart Namenda and titrate to 28 mg daily. Continue Zoloft. Think about obtaining  help  in the home or placement. Discussed adult daycare. Discussed the importance of taking medicines as indicated, blood pressure is elevated today and she has not taken her medications for this. Follow-up in 6 months Vst time 40 min N. Darrol Angel, [facility]  [facility] [address] [place], [place] [address]  Phone [phone] Fax [phone]

## 2015-01-25 ENCOUNTER — Telehealth: Payer: Self-pay | Admitting: Nurse Practitioner

## 2015-01-25 NOTE — Telephone Encounter (Signed)
Patient's daughter Heather Floyd is calling. Heather Floyd states the patient's memory issues have progressed. Heather Floyd says the patient needs to be put in a memory care facility. In order to get her in a facility she will need an FL2 test.Can she come to our office and have this done?  Please call Heather Floyd and advise. Thank you.
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
## 2015-01-25 NOTE — Telephone Encounter (Signed)
The form has been filled out, signed.

## 2015-01-26 ENCOUNTER — Telehealth: Payer: Self-pay | Admitting: *Deleted

## 2015-01-26 NOTE — Telephone Encounter (Signed)
Form,Fmla-2 received,completed by Dr [name] and Earney Navy at front desk 01/26/15.

## 2015-01-29 ENCOUNTER — Telehealth: Payer: Self-pay | Admitting: Neurology

## 2015-01-29 NOTE — Telephone Encounter (Signed)
Angela/[facility] [phone] called regarding FL2, needs clarification on Memantine HCl ER (NAMENDA XR TITRATION PACK) 7 & 14 & 21 &28 MG CP24, they need to know how long, how many mg's and they need to know what her diet is

## 2015-01-29 NOTE — Telephone Encounter (Signed)
Angela/ [facility] is returning your call. She does have questions. [phone]

## 2015-01-29 NOTE — Telephone Encounter (Signed)
I called Angela. She needed to know the exact instructions for the titration pack. I explained the patient will take 7 mg daily for one week, then 14 mg daily for one week, 21 mg daily for one week and finally 28 mg daily. She stated she would send over a verification form for Dr. [name] to sign with the patient's code status as well.

## 2015-01-29 NOTE — Telephone Encounter (Signed)
I called Marylene Land and left a voicemail. Per Rx for Namenda, the patient should take the titration as package directs and then resume 28 mg daily. The patient should be on a no salt added diet.

## 2015-01-30 NOTE — Telephone Encounter (Signed)
Med list signed and faxed back to [facility]. Confirmation received.

## 2015-03-08 ENCOUNTER — Telehealth: Payer: Self-pay | Admitting: Neurology

## 2015-03-08 MED ORDER — SERTRALINE HCL 100 MG PO TABS: 100.0000 mg | ORAL_TABLET | Freq: Every day | ORAL | Status: DC
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
## 2015-03-08 NOTE — Telephone Encounter (Signed)
I called the daughter. The patient is obsessing about the fact that she cannot drive her car. She is on low-dose Zoloft at 50 mg daily, I will go up to 100 mg a day. The patient currently is living with her daughter, they are trying to transition her to [facility]. I would not consider a transfer to [place] quite yet.

## 2015-04-18 ENCOUNTER — Emergency Department (HOSPITAL_COMMUNITY)
Admission: EM | Admit: 2015-04-18 | Discharge: 2015-04-18 | Disposition: A | Discharge status: Home / Self Care | Payer: Medicare Other | Attending: Emergency Medicine | Admitting: Emergency Medicine

## 2015-04-18 ENCOUNTER — Encounter (HOSPITAL_COMMUNITY): Payer: Self-pay | Admitting: *Deleted

## 2015-04-18 DIAGNOSIS — Z87891 Personal history of nicotine dependence: Secondary | ICD-10-CM | POA: Insufficient documentation

## 2015-04-18 DIAGNOSIS — Z8669 Personal history of other diseases of the nervous system and sense organs: Secondary | ICD-10-CM | POA: Insufficient documentation

## 2015-04-18 DIAGNOSIS — R1310 Dysphagia, unspecified: Secondary | ICD-10-CM | POA: Diagnosis present

## 2015-04-18 DIAGNOSIS — E876 Hypokalemia: Secondary | ICD-10-CM | POA: Diagnosis not present

## 2015-04-18 DIAGNOSIS — Z8673 Personal history of transient ischemic attack (TIA), and cerebral infarction without residual deficits: Secondary | ICD-10-CM | POA: Insufficient documentation

## 2015-04-18 DIAGNOSIS — K208 Adverse effect of unspecified drugs, medicaments and biological substances, initial encounter: Secondary | ICD-10-CM

## 2015-04-18 DIAGNOSIS — Z8739 Personal history of other diseases of the musculoskeletal system and connective tissue: Secondary | ICD-10-CM | POA: Insufficient documentation

## 2015-04-18 DIAGNOSIS — Z79899 Other long term (current) drug therapy: Secondary | ICD-10-CM | POA: Diagnosis not present

## 2015-04-18 DIAGNOSIS — Z86010 Personal history of colonic polyps: Secondary | ICD-10-CM | POA: Insufficient documentation

## 2015-04-18 DIAGNOSIS — I10 Essential (primary) hypertension: Secondary | ICD-10-CM | POA: Insufficient documentation

## 2015-04-18 DIAGNOSIS — T50905A Adverse effect of unspecified drugs, medicaments and biological substances, initial encounter: Secondary | ICD-10-CM

## 2015-04-18 DIAGNOSIS — F419 Anxiety disorder, unspecified: Secondary | ICD-10-CM | POA: Insufficient documentation

## 2015-04-18 LAB — CBC WITH DIFFERENTIAL/PLATELET
Basophils Absolute: 0 10*3/uL (ref 0.0–0.1)
Basophils Relative: 0 %
Eosinophils Absolute: 0.1 10*3/uL (ref 0.0–0.7)
Eosinophils Relative: 2 %
HCT: 36.9 % (ref 36.0–46.0)
Hemoglobin: 12.9 g/dL (ref 12.0–15.0)
Lymphocytes Relative: 14 %
Lymphs Abs: 1 10*3/uL (ref 0.7–4.0)
MCH: 30 pg (ref 26.0–34.0)
MCHC: 35 g/dL (ref 30.0–36.0)
MCV: 85.8 fL (ref 78.0–100.0)
Monocytes Absolute: 0.6 10*3/uL (ref 0.1–1.0)
Monocytes Relative: 9 %
Neutro Abs: 5.2 10*3/uL (ref 1.7–7.7)
Neutrophils Relative %: 75 %
Platelets: 217 10*3/uL (ref 150–400)
RBC: 4.3 MIL/uL (ref 3.87–5.11)
RDW: 13.5 % (ref 11.5–15.5)
WBC: 7 10*3/uL (ref 4.0–10.5)

## 2015-04-18 LAB — BASIC METABOLIC PANEL
Anion gap: 11 (ref 5–15)
BUN: 9 mg/dL (ref 6–20)
CO2: 31 mmol/L (ref 22–32)
Calcium: 9.2 mg/dL (ref 8.9–10.3)
Chloride: 98 mmol/L — ABNORMAL LOW (ref 101–111)
Creatinine, Ser: 0.77 mg/dL (ref 0.44–1.00)
GFR calc Af Amer: >60 mL/min (ref >60)
GFR calc non Af Amer: >60 mL/min (ref >60)
Glucose, Bld: 115 mg/dL — ABNORMAL HIGH (ref 65–99)
Potassium: 2.6 mmol/L — CL (ref 3.5–5.1)
Sodium: 140 mmol/L (ref 135–145)

## 2015-04-18 MED ORDER — LIDOCAINE VISCOUS 2 % MT SOLN
10.0000 mL | Freq: Once | OROMUCOSAL | Status: AC
  Administered 2015-04-18: 10 mL via OROMUCOSAL
  Filled 2015-04-18: qty 15

## 2015-04-18 MED ORDER — POTASSIUM CHLORIDE 20 MEQ/15ML (10%) PO SOLN
60.0000 meq | Freq: Once | ORAL | Status: AC
  Administered 2015-04-18: 60 meq via ORAL
  Filled 2015-04-18: qty 45

## 2015-04-18 MED ORDER — DIAZEPAM 5 MG PO TABS
5.0000 mg | ORAL_TABLET | Freq: Once | ORAL | Status: AC
  Administered 2015-04-18: 5 mg via ORAL
  Filled 2015-04-18: qty 1

## 2015-04-18 NOTE — ED Notes (Signed)
Pt is now anxious and c/o not being able to find her pocketbook.  Stating that she passed out in the street and someone brought her here.  States she was playing ball and someone brought her here.  She doesn't know how she got here.  She knows she was in an ambulance but doesn't know why.  She doesn't know whether family knows she's here or not.

## 2015-04-18 NOTE — ED Notes (Addendum)
Pt is aware we will need to collect a urine sample

## 2015-04-18 NOTE — ED Notes (Signed)
[facility] Expected date:  Expected time:  Means of arrival:  Comments: Ems- 81 yo anxiety

## 2015-04-18 NOTE — ED Notes (Signed)
Per EMS pt coming from home with c/o anxiety after not being able to swallow a pill. Pt denies hx of difficulty swallowing before, EMS sts they gave her water bottle to sip on and she was able to take small sips. Pt now only c/o burning in her throat, sts it's from the pill that ws dissolving.

## 2015-04-18 NOTE — ED Notes (Signed)
Family at bedside. 

## 2015-04-18 NOTE — Discharge Instructions (Signed)
Esophagitis Esophagitis is inflammation of the esophagus. The esophagus is the tube that carries food and liquids from your mouth to your stomach. Esophagitis can cause soreness or pain in the esophagus. This condition can make it difficult and painful to swallow.  CAUSES Most causes of esophagitis are not serious. Common causes of this condition include:  Gastroesophageal reflux disease (GERD). This is when stomach contents move back up into the esophagus (reflux).  Repeated vomiting.  An allergic-type reaction, especially caused by food allergies (eosinophilic esophagitis).  Injury to the esophagus by swallowing large pills with or without water, or swallowing certain types of medicines.  Swallowing (ingesting) harmful chemicals, such as household cleaning products.  Heavy alcohol use.  An infection of the esophagus.This most often occurs in people who have a weakened immune system.  Radiation or chemotherapy treatment for cancer.  Certain diseases such as sarcoidosis, Crohn disease, and scleroderma. SYMPTOMS Symptoms of this condition include:  Difficult or painful swallowing.  Pain with swallowing acidic liquids, such as citrus juices.  Pain with burping.  Chest pain.  Difficulty breathing.  Nausea.  Vomiting.  Pain in the abdomen.  Weight loss.  Ulcers in the mouth.  Patches of white material in the mouth (candidiasis).  Fever.  Coughing up blood or vomiting blood.  Stool that is black, tarry, or bright red. DIAGNOSIS Your health care provider will take a medical history and perform a physical exam. You may also have other tests, including:  An endoscopy to examine your stomach and esophagus with a small camera.  A test that measures the acidity level in your esophagus.  A test that measures how much pressure is on your esophagus.  A barium swallow or modified barium swallow to show the shape, size, and functioning of your esophagus.  Allergy  tests. TREATMENT Treatment for this condition depends on the cause of your esophagitis. In some cases, steroids or other medicines may be given to help relieve your symptoms or to treat the underlying cause of your condition. You may have to make some lifestyle changes, such as:  Avoiding alcohol.  Quitting smoking.  Changing your diet.  Exercising.  Changing your sleep habits and your sleep environment. HOME CARE INSTRUCTIONS Take these actions to decrease your discomfort and to help avoid complications. Diet  Follow a diet as recommended by your health care provider. This may involve avoiding foods and drinks such as:  Coffee and tea (with or without caffeine).  Drinks that contain alcohol.  Energy drinks and sports drinks.  Carbonated drinks or sodas.  Chocolate and cocoa.  Peppermint and mint flavorings.  Garlic and onions.  Horseradish.  Spicy and acidic foods, including peppers, chili powder, curry powder, vinegar, hot sauces, and barbecue sauce.  Citrus fruit juices and citrus fruits, such as oranges, lemons, and limes.  Tomato-based foods, such as red sauce, chili, salsa, and pizza with red sauce.  Fried and fatty foods, such as donuts, french fries, potato chips, and high-fat dressings.  High-fat meats, such as hot dogs and fatty cuts of red and white meats, such as rib eye steak, sausage, ham, and bacon.  High-fat dairy items, such as whole milk, butter, and cream cheese.  Eat small, frequent meals instead of large meals.  Avoid drinking large amounts of liquid with your meals.  Avoid eating meals during the 2-3 hours before bedtime.  Avoid lying down right after you eat.  Do not exercise right after you eat.  Avoid foods and drinks that seem to  make your symptoms worse. General Instructions  Pay attention to any changes in your symptoms.  Take over-the-counter and prescription medicines only as told by your health care provider. Do not take  aspirin, ibuprofen, or other NSAIDs unless your health care provider told you to do so.  If you have trouble taking pills, use a pill splitter to decrease the size of the pill. This will decrease the chance of the pill getting stuck or injuring your esophagus on the way down. Also, drink water after you take a pill.  Do not use any tobacco products, including cigarettes, chewing tobacco, and e-cigarettes. If you need help quitting, ask your health care provider.  Wear loose-fitting clothing. Do not wear anything tight around your waist that causes pressure on your abdomen.  Raise (elevate) the head of your bed about 6 inches (15 cm).  Try to reduce your stress, such as with yoga or meditation. If you need help reducing stress, ask your health care provider.  If you are overweight, reduce your weight to an amount that is healthy for you. Ask your health care provider for guidance about a safe weight loss goal.  Keep all follow-up visits as told by your health care provider. This is important. SEEK MEDICAL CARE IF:  You have new symptoms.  You have unexplained weight loss.  You have difficulty swallowing, or it hurts to swallow.  You have wheezing or a persistent cough.  Your symptoms do not improve with treatment.  You have frequent heartburn for more than two weeks. SEEK IMMEDIATE MEDICAL CARE IF:  You have severe pain in your arms, neck, jaw, teeth, or back.  You feel sweaty, dizzy, or light-headed.  You have chest pain or shortness of breath.  You vomit and your vomit looks like blood or coffee grounds.  Your stool is bloody or black.  You have a fever.  You cannot swallow, drink, or eat.   This information is not intended to replace advice given to you by your health care provider. Make sure you discuss any questions you have with your health care provider.   Document Released: 07/10/2004 Document Revised: 02/21/2015 Document Reviewed: 09/27/2014 Elsevier Interactive  Patient Education 2016 [employer]  Hypokalemia Hypokalemia means that the amount of potassium in the blood is lower than normal.Potassium is a chemical, called an electrolyte, that helps regulate the amount of fluid in the body. It also stimulates muscle contraction and helps nerves function properly.Most of the body's potassium is inside of cells, and only a very small amount is in the blood. Because the amount in the blood is so small, minor changes can be life-threatening. CAUSES  Antibiotics.  Diarrhea or vomiting.  Using laxatives too much, which can cause diarrhea.  Chronic kidney disease.  Water pills (diuretics).  Eating disorders (bulimia).  Low magnesium level.  Sweating a lot. SIGNS AND SYMPTOMS  Weakness.  Constipation.  Fatigue.  Muscle cramps.  Mental confusion.  Skipped heartbeats or irregular heartbeat (palpitations).  Tingling or numbness. DIAGNOSIS  Your health care provider can diagnose hypokalemia with blood tests. In addition to checking your potassium level, your health care provider may also check other lab tests. TREATMENT Hypokalemia can be treated with potassium supplements taken by mouth or adjustments in your current medicines. If your potassium level is very low, you may need to get potassium through a vein (IV) and be monitored in the hospital. A diet high in potassium is also helpful. Foods high in potassium are:  Nuts, such  as peanuts and pistachios.  Seeds, such as sunflower seeds and pumpkin seeds.  Peas, lentils, and lima beans.  Whole grain and bran cereals and breads.  Fresh fruit and vegetables, such as apricots, avocado, bananas, cantaloupe, kiwi, oranges, tomatoes, asparagus, and potatoes.  Orange and tomato juices.  Red meats.  Fruit yogurt. HOME CARE INSTRUCTIONS  Take all medicines as prescribed by your health care provider.  Maintain a healthy diet by including nutritious food, such as fruits, vegetables,  nuts, whole grains, and lean meats.  If you are taking a laxative, be sure to follow the directions on the label. SEEK MEDICAL CARE IF:  Your weakness gets worse.  You feel your heart pounding or racing.  You are vomiting or having diarrhea.  You are diabetic and having trouble keeping your blood glucose in the normal range. SEEK IMMEDIATE MEDICAL CARE IF:  You have chest pain, shortness of breath, or dizziness.  You are vomiting or having diarrhea for more than 2 days.  You faint. MAKE SURE YOU:   Understand these instructions.  Will watch your condition.  Will get help right away if you are not doing well or get worse.   This information is not intended to replace advice given to you by your health care provider. Make sure you discuss any questions you have with your health care provider.   Document Released: 06/02/2005 Document Revised: 06/23/2014 Document Reviewed: 12/03/2012 Elsevier Interactive Patient Education [employer]

## 2015-04-18 NOTE — ED Notes (Signed)
Pt is alert and oriented to person but disoriented to time, specific place and situation.

## 2015-04-18 NOTE — ED Provider Notes (Signed)
CSN: [id number]     Arrival date & time 04/18/15  1423 History   First MD Initiated Contact with Patient 04/18/15 1506     Chief Complaint  Patient presents with  . Anxiety  . Dysphagia     (Consider location/radiation/quality/duration/timing/severity/associated sxs/prior Treatment) HPI   81 year old female apparently with pill esophagitis? Patient is very anxious and difficult to redirect. Mostly perseverating about her inability to find her pocketbook. She does endorse some burning in her throat when specifically asked. Says a pill got stuck as she was trying to swallow it. This happened shortly before arrival. Says it was an aspirin although aspirin is not on medication list and actually listed as an allergy. Also telling me she "blacked out" but unable to provide much in terms of specifics beyond this. Hard to obtain a coherent history from her.   Past Medical History  Diagnosis Date  . Hyperlipidemia   . Hypertension   . RLS (restless legs syndrome)   . Sleep disorder   . Personal history of colonic polyps   . Osteoporosis   . DJD (degenerative joint disease) of cervical spine   . Varicose veins   . TIA (transient ischemic attack)   . Carotid artery occlusion   . Memory disturbance 03/29/2013  . Abnormality of gait 09/08/2014  . Concussion with loss of consciousness 09/08/2014   Past Surgical History  Procedure Laterality Date  . Anterior and posterior vaginal repair w/ sacrospinous ligament suspension    . Endovenous ablation saphenous vein w/ laser    . Breast reduction surgery    . Cesarean section    . Hemorrhoid surgery     Family History  Problem Relation Age of Onset  . Mental illness Mother   . Alzheimer's disease Mother   . Cancer Father   . Bone cancer Father   . Dementia Sister    Social History  Substance Use Topics  . Smoking status: Former Smoker    Quit date: 06/16/1980  . Smokeless tobacco: Never Used  . Alcohol Use: No   OB History    No data  available     Review of Systems  All systems reviewed and negative, other than as noted in HPI.   Allergies  Aspirin and Codeine  Home Medications   Prior to Admission medications   Medication Sig Start Date End Date Taking? Authorizing Provider  ALPRAZolam [name]) 1 MG tablet Take 1 tablet (1 mg total) by mouth every 8 (eight) hours as needed for anxiety. Patient taking differently: Take 0.5 mg by mouth every 8 (eight) hours as needed for anxiety.  09/08/14  Yes [name], MD  Memantine HCl ER (NAMENDA XR) 28 MG CP24 Take 28 mg by mouth daily. 05/29/14  Yes [name], MD  sertraline (ZOLOFT) 100 MG tablet Take 1 tablet (100 mg total) by mouth daily. 03/08/15  Yes [name], MD   BP 154/87 mmHg  Pulse 87  Temp(Src) 98.6 F (37 C) (Oral)  Resp 24  SpO2 98% Physical Exam  Constitutional: She appears well-developed and well-nourished.  Sitting up in bed looking around room. Asking me multiple times where her pocket book is. Appears anxious.   HENT:  Head: Normocephalic and atraumatic.  Eyes: Conjunctivae are normal. Right eye exhibits no discharge. Left eye exhibits no discharge.  Neck: Neck supple.  Cardiovascular: Normal rate, regular rhythm and normal heart sounds.  Exam reveals no gallop and no friction rub.   No murmur heard. Pulmonary/Chest:  Effort normal and breath sounds normal. No respiratory distress.  Abdominal: Soft. She exhibits no distension. There is no tenderness.  Musculoskeletal: She exhibits no edema or tenderness.  No external signs of acute trauma. Moving all extremities w/o apparent difficulty.   Neurological: She is alert. No cranial nerve deficit. She exhibits normal muscle tone. Coordination normal.  Disoriented to time. Able to say in hospital but unsure why she is here.   Skin: Skin is warm and dry.  Psychiatric:  Anxious. Tangential. Difficult to redirect.   Nursing note and vitals reviewed.   ED Course  Procedures (including  critical care time) Labs Review Labs Reviewed - No data to display  Imaging Review No results found. I have personally reviewed and evaluated these images and lab results as part of my medical decision-making.   EKG Interpretation   Date/Time:  Wednesday April 18 2015 14:47:43 EDT Ventricular Rate:  80 PR Interval:  122 QRS Duration: 73 QT Interval:  446 QTC Calculation: 514 R Axis:   -6 Text Interpretation:  Sinus rhythm LVH  QT prolonged Confirmed by Juleen ChinaKOHUT   MD, Kahlil Cowans ([id number]) on 04/18/2015 3:07:48 PM      MDM   Final diagnoses:  Pill esophagitis  Hypokalemia   82yF with anxiety. Reportedly coming from from by EMS after difficulty swallowing pill. Telling me she "blacked out" but doesn't provide specifics. She is very anxious and hard to redirect. Fixated on finding her pocket book. Per review of records, seems to live at home with family/caretakers. History of memory disturbance.   Family now bedside. They report that patient is essentially at her baseline. They confirm history provided by EMS. Patient has no acute distress. Patient is stable for discharge.    Raeford RazorStephen Lemont Sitzmann, MD 04/22/15 2130

## 2015-06-18 ENCOUNTER — Other Ambulatory Visit: Payer: Self-pay | Admitting: Neurology

## 2015-07-25 ENCOUNTER — Encounter: Payer: Self-pay | Admitting: Nurse Practitioner

## 2015-07-25 ENCOUNTER — Ambulatory Visit (INDEPENDENT_AMBULATORY_CARE_PROVIDER_SITE_OTHER): Payer: Medicare Other | Admitting: Nurse Practitioner

## 2015-07-25 VITALS — BP 174/84 | HR 82 | Ht 62.0 in | Wt 116.2 lb

## 2015-07-25 DIAGNOSIS — S060X1D Concussion with loss of consciousness of 30 minutes or less, subsequent encounter: Secondary | ICD-10-CM | POA: Diagnosis not present

## 2015-07-25 DIAGNOSIS — R413 Other amnesia: Secondary | ICD-10-CM | POA: Diagnosis not present

## 2015-07-25 DIAGNOSIS — I10 Essential (primary) hypertension: Secondary | ICD-10-CM | POA: Diagnosis not present

## 2015-07-25 MED ORDER — MEMANTINE HCL ER 28 MG PO CP24: 28.0000 mg | ORAL_CAPSULE | Freq: Every day | ORAL | Status: DC

## 2015-07-25 MED ORDER — SERTRALINE HCL 100 MG PO TABS: 100.0000 mg | ORAL_TABLET | Freq: Every day | ORAL | Status: DC

## 2015-07-25 NOTE — Progress Notes (Signed)
I have read the note, and I agree with the clinical assessment and plan.  Heather Floyd   

## 2015-07-25 NOTE — Progress Notes (Signed)
[facility]  PATIENT: Heather Floyd DOB: [birth year]   REASON FOR VISIT: Follow-up for concussion after fall 07/2014  and memory disturbance HISTORY FROM: Patient and daughter    HISTORY OF PRESENT ILLNESS: Heather Floyd, 82 year old female returns for follow-up. She has a history of progressive memory disorder which worsened after a fall with concussion and small subdural hematoma February 2016. The patient continues to live with her daughter and has paid  caregivers during the day. The daughter works full time . She naps during the day 3-4 hours and then has trouble sleeping at night. She also has to get up several times at night to go to the bathroom. She drinks Cokes all day.  Her Zoloft was increased to 100 mg in September because the patient was obsessing about no longer being able to drive. The family was looking into a memory care unit but now they have decided to keep her at home. She has Xanax to take when necessary for agitation. Adult day care has been discussed before,  however the patient does not want to get out of the house. She remains able to feed, bathe and dress herself.  She cannot tolerate Aricept previously. She returns for reevaluation    HISTORY Heather Floyd is a 82 year old right-handed white female with a history of a progressive memory disorder. The patient was last seen by Dr. [name] 09/08/2014 following a significant fall that occurred on 08/10/2014. The patient struck her head, and she sustained a concussion with a small subdural hematoma noted in the medial parietal area. The hematoma has resolved according to MRI of the brain dated 09/30/2014. The patient is currently residing with her daughter who works full time . She continues with worsening cognitive functioning. The patient is having more problems with confusion, particularly with sundowning at night. She is having agitation issues. She is sleeping well when she does get to sleep, but she has  been given 1 mg tablets of Xanax to use at night and sometimes during the day as well for agitation. She stopped her Namenda 3 months ago because the family says she had no more refills and they could not get a response from our office however according to the records she has refills until December. The family is concerned about safety issues when the daughter goes to work. She is not eating well. She continues to drive even though she has been told she should not. The daughter is the power of attorney and has looked into a memory unit. The patient has a DO NOT RESUSCITATE order. She returns for reevaluation    REVIEW OF SYSTEMS: Full 14 system review of systems performed and notable only for those listed, all others are neg:  Constitutional: Appetite change , fatigue Cardiovascular: neg Ear/Nose/Throat: Hearing loss Eyes: Blurred vision Respiratory: neg Gastroitestinal: neg  Hematology/Lymphatic: neg  Endocrine: neg Musculoskeletal:neg Allergy/Immunology: neg Neurological: Memory loss, difficulty getting her words out Psychiatric: Confusion and agitation Sleep : Daytime sleepiness, napping   ALLERGIES: Allergies  Allergen Reactions  . Aspirin Other (See Comments)    Makes blood too thin  . Codeine Nausea Only    HOME MEDICATIONS: Outpatient Prescriptions Prior to Visit  Medication Sig Dispense Refill  . ALPRAZolam (XANAX) 1 MG tablet Take 1 tablet (1 mg total) by mouth every 8 (eight) hours as needed for anxiety. (Patient taking differently: Take 0.5 mg by mouth every 8 (eight) hours as needed for anxiety. ) 60 tablet 3  . NAMENDA XR 28  MG CP24 24 hr capsule TAKE 1 CAPSULE BY MOUTH DAILY 30 capsule 1  . sertraline (ZOLOFT) 100 MG tablet Take 1 tablet (100 mg total) by mouth daily. 30 tablet 3   No facility-administered medications prior to visit.    PAST MEDICAL HISTORY: Past Medical History  Diagnosis Date  . Hyperlipidemia   . Hypertension   . RLS (restless legs  syndrome)   . Sleep disorder   . Personal history of colonic polyps   . Osteoporosis   . DJD (degenerative joint disease) of cervical spine   . Varicose veins   . TIA (transient ischemic attack)   . Carotid artery occlusion   . Memory disturbance 03/29/2013  . Abnormality of gait 09/08/2014  . Concussion with loss of consciousness 09/08/2014    PAST SURGICAL HISTORY: Past Surgical History  Procedure Laterality Date  . Anterior and posterior vaginal repair w/ sacrospinous ligament suspension    . Endovenous ablation saphenous vein w/ laser    . Breast reduction surgery    . Cesarean section    . Hemorrhoid surgery      FAMILY HISTORY: Family History  Problem Relation Age of Onset  . Mental illness Mother   . Alzheimer's disease Mother   . Cancer Father   . Bone cancer Father   . Dementia Sister     SOCIAL HISTORY: Social History   Social History  . Marital Status: Widowed    Spouse Name: N/A  . Number of Children: 3  . Years of Education: 12   Occupational History  . retired    Social History Main Topics  . Smoking status: Former Smoker    Quit date: 06/16/1980  . Smokeless tobacco: Never Used  . Alcohol Use: No  . Drug Use: No  . Sexual Activity: Not on file   Other Topics Concern  . Not on file   Social History Narrative     PHYSICAL EXAM  Filed Vitals:   07/25/15 0840  BP: 174/84  Pulse: 82  Height:  (1.575 m)  Weight: 116 lb 3.2 oz (52.708 kg)   Body mass index is 21.25 kg/(m^2). General: The patient is alert and cooperative at the time of the examination. Skin: No significant peripheral edema is noted.  Neurologic Exam Mental status: The Mini-Mental Status Examination done today shows a total score of 19/30. Animal fluency test score is 4. Clock drawing 0/4.  Cranial nerves: Facial symmetry is present. Speech is normal, however some word finding noted ,no aphasia or dysarthria is noted. Extraocular movements are full. Visual Strege  are full. Motor: The patient has good strength in all 4 extremities. Sensory examination: Soft touch sensation is symmetric on the face, arms, and legs. Coordination: The patient has good finger-nose-finger and heel-to-shin bilaterally,. Gait and station: The patient is able to ambulate independently. Romberg is negative. No assistive device Reflexes: Deep tendon reflexes are symmetric upper and lower.  DIAGNOSTIC DATA (LABS, IMAGING, TESTING) - I reviewed patient records, labs, notes, testing and imaging myself where available.  Lab Results  Component Value Date   WBC 7.0 04/18/2015   HGB 12.9 04/18/2015   HCT 36.9 04/18/2015   MCV 85.8 04/18/2015   PLT 217 04/18/2015      Component Value Date/Time   NA 140 04/18/2015 1615   K 2.6* 04/18/2015 1615   CL 98* 04/18/2015 1615   CO2 31 04/18/2015 1615   GLUCOSE 115* 04/18/2015 1615   BUN 9 04/18/2015 1615   CREATININE 0.77  04/18/2015 1615   CALCIUM 9.2 04/18/2015 1615   PROT 6.1 09/30/2014 1135   ALBUMIN 3.7 09/30/2014 1135   AST 24 09/30/2014 1135   ALT 14 09/30/2014 1135   ALKPHOS 49 09/30/2014 1135   BILITOT 0.7 09/30/2014 1135   GFRNONAA >60 04/18/2015 1615   GFRAA >60 04/18/2015 1615   Lab Results  Component Value Date   CHOL 122 10/01/2014   HDL 20* 10/01/2014   LDLCALC 64 10/01/2014   TRIG 188* 10/01/2014   CHOLHDL 6.1 10/01/2014   Lab Results  Component Value Date   HGBA1C 5.3 09/30/2014   Lab Results  Component Value Date   VITAMINB12 709 03/29/2013   Lab Results  Component Value Date   TSH 1.898 01/14/2013      ASSESSMENT AND PLAN 82 y.o. year old female has a past medical history of memory disturbance and small hematoma due to fall and likely concussion. This occurred February 2026 and  her most recent CT shows resolution of the hematoma. She continues to have problems with agitation. She has been instructed not to drive. Family wishes to keep her at home as long as possible.  PLAN:  Continue  Namenda at current dose will refill Continue Xanax when necessary Consider Depakote Sprinkle 125 mg by mouth for agitation call me after discussing with sister  Create a safe environment, remove locks on bathroom  doors Reduce confusion, keep familiar objects and people around, stick to a routine Use effective communication such as simple words and short sentences Reduce nighttime restlessness, a consistent nighttime routine,  avoid napping during the day Encourage good nutrition and hydration continue walking the dog, exercise is beneficial Additional 10 minutes spent answering questions for daughter F/U in 6 months next with Dr. [name] Vst time 25 min Nilda Riggs, [facility], [facility], APRN  [facility] [address] [place], [place] [address] [phone]

## 2015-07-25 NOTE — Patient Instructions (Signed)
Continue Namenda at current dose will refill Continue Xanax when necessary Consider Depakote Sprinkle 125 mg by mouth for agitation  Create a safe environment, remove locks on bathroom  doors Reduce confusion, keep familiar objects and people around, stick to a routine Use effective communication such as simple words and short sentences Reduce nighttime restlessness, a consistent nighttime routine,  avoid napping during the day Encourage good nutrition and hydration continue walking the dog exercise is beneficial F/U in 6 months next with Dr. [name]

## 2015-07-27 ENCOUNTER — Ambulatory Visit (INDEPENDENT_AMBULATORY_CARE_PROVIDER_SITE_OTHER): Payer: Medicare Other | Admitting: Podiatry

## 2015-07-27 ENCOUNTER — Encounter: Payer: Self-pay | Admitting: Podiatry

## 2015-07-27 VITALS — BP 198/102 | HR 93 | Resp 18

## 2015-07-27 DIAGNOSIS — B351 Tinea unguium: Secondary | ICD-10-CM | POA: Diagnosis not present

## 2015-07-27 DIAGNOSIS — M79676 Pain in unspecified toe(s): Secondary | ICD-10-CM | POA: Diagnosis not present

## 2015-07-27 MED ORDER — CICLOPIROX 8 % EX SOLN: Freq: Every day | CUTANEOUS | Status: DC

## 2015-07-27 NOTE — Progress Notes (Signed)
   Subjective:    Patient ID: Heather Floyd, female    DOB: [birth year], 82 y.o.   MRN: [id number]  HPI  82 year old female presents the office with concerns of thick, painful, elongated, discolored toenails for which she cannot trim herself. Denies any surrounding redness or red streaks or any drainage or other signs of infection. She's had no recent treatment. No other complaints at this time.  Review of Systems  All other systems reviewed and are negative.      Objective:   Physical Exam General: Awake, Alert, NAD  Dermatological:  Nails appear to be hypertrophic, dystrophic, brittle, discolored, elongated 10. There is no swelling erythema or drainage. There is tenderness in nails 1-5 bilaterally particularly the hallux toenails. No other open lesions or pre-ulcerative lesions identified at this time.  Vascular: Dorsalis Pedis artery and Posterior Tibial artery pedal pulses are palpable bilateral with immedate capillary fill time. Pedal hair growth present. There is no pain with calf compression, swelling, warmth, erythema.   Neruologic: Grossly intact via light touch bilateral. Vibratory intact via tuning fork bilateral. Protective threshold with Semmes Wienstein monofilament intact to all pedal sites bilateral. Patellar and Achilles deep tendon reflexes 2+ bilateral. No Babinski or clonus noted bilateral.   Musculoskeletal: No gross boney pedal deformities bilateral. No pain, crepitus, or limitation noted with foot and ankle range of motion bilateral. Muscular strength 5/5 in all groups tested bilateral.  Gait: Unassisted, Nonantalgic.       Assessment & Plan:   82 year old female with symptomatic onychomycosis -Treatment options discussed including all alternatives, risks, and complications -Etiology of symptoms were discussed -Nails debrided 10 without complications or bleeding. -Discussed the importance of daily foot inspection. -Discussed treatment options for onychomycosis.  She'll consider over-the-counter treatment. -Follow-up in 3 months or sooner if any problems arise. In the meantime, encouraged to call the office with any questions, concerns, change in symptoms.   Ovid Curd, DPM

## 2015-10-29 ENCOUNTER — Encounter: Payer: Self-pay | Admitting: Podiatry

## 2015-10-29 ENCOUNTER — Ambulatory Visit (INDEPENDENT_AMBULATORY_CARE_PROVIDER_SITE_OTHER): Payer: Medicare Other | Admitting: Podiatry

## 2015-10-29 DIAGNOSIS — M79676 Pain in unspecified toe(s): Secondary | ICD-10-CM

## 2015-10-29 DIAGNOSIS — B351 Tinea unguium: Secondary | ICD-10-CM

## 2015-10-29 NOTE — Progress Notes (Signed)
Patient ID: Heather PandaGail M Prestwood, female   DOB: 06/07/1933, 82 y.o.   MRN: [id number] Subjective: 82 year old female presents the office with concerns of thick, painful, elongated, discolored toenails for which she cannot trim herself. Denies any surrounding redness or red streaks or any drainage or other signs of infection. She has not been using anti fungal medication. No other complaints.  Objective: General: Awake, Alert, NAD  Dermatological:  Nails appear to be hypertrophic, dystrophic, brittle, discolored, elongated 10. There is no swelling erythema or drainage. There is tenderness in nails 1-5 bilaterally particularly the hallux toenails. No other open lesions or pre-ulcerative lesions identified at this time.  Vascular: Dorsalis Pedis artery and Posterior Tibial artery pedal pulses are palpable bilateral with immedate capillary fill time. Pedal hair growth present. There is no pain with calf compression, swelling, warmth, erythema.   Neruologic: Grossly intact via light touch bilateral. Vibratory intact via tuning fork bilateral. Protective threshold with Semmes Wienstein monofilament intact to all pedal sites bilateral. Patellar and Achilles deep tendon reflexes 2+ bilateral. No Babinski or clonus noted bilateral.   Musculoskeletal: No gross boney pedal deformities bilateral. No pain, crepitus, or limitation noted with foot and ankle range of motion bilateral. Muscular strength 5/5 in all groups tested bilateral.  Gait: Unassisted, Nonantalgic.   Assessment:  82 year old female with symptomatic onychomycosis  Plan: -Treatment options discussed including all alternatives, risks, and complications -Etiology of symptoms were discussed -Nails debrided 10 without complications or bleeding. -Discussed the importance of daily foot inspection. -Follow-up in 3 months or sooner if any problems arise. In the meantime, encouraged to call the office with any questions, concerns, change in symptoms.    Ovid CurdMatthew Erynn Vaca, DPM

## 2015-12-11 ENCOUNTER — Encounter (HOSPITAL_COMMUNITY): Payer: Self-pay

## 2015-12-11 ENCOUNTER — Emergency Department (HOSPITAL_COMMUNITY): Payer: Medicare Other

## 2015-12-11 ENCOUNTER — Emergency Department (HOSPITAL_COMMUNITY)
Admission: EM | Admit: 2015-12-11 | Discharge: 2015-12-13 | Disposition: A | Discharge status: Other Acute Inpatient Hospital | Payer: Medicare Other | Attending: Emergency Medicine | Admitting: Emergency Medicine

## 2015-12-11 ENCOUNTER — Telehealth: Payer: Self-pay | Admitting: Neurology

## 2015-12-11 DIAGNOSIS — E785 Hyperlipidemia, unspecified: Secondary | ICD-10-CM | POA: Diagnosis not present

## 2015-12-11 DIAGNOSIS — Z87891 Personal history of nicotine dependence: Secondary | ICD-10-CM | POA: Insufficient documentation

## 2015-12-11 DIAGNOSIS — Z008 Encounter for other general examination: Secondary | ICD-10-CM

## 2015-12-11 DIAGNOSIS — F0391 Unspecified dementia with behavioral disturbance: Secondary | ICD-10-CM | POA: Diagnosis not present

## 2015-12-11 DIAGNOSIS — I10 Essential (primary) hypertension: Secondary | ICD-10-CM | POA: Insufficient documentation

## 2015-12-11 DIAGNOSIS — R4182 Altered mental status, unspecified: Secondary | ICD-10-CM | POA: Diagnosis present

## 2015-12-11 DIAGNOSIS — E876 Hypokalemia: Secondary | ICD-10-CM | POA: Diagnosis not present

## 2015-12-11 DIAGNOSIS — Z8673 Personal history of transient ischemic attack (TIA), and cerebral infarction without residual deficits: Secondary | ICD-10-CM | POA: Diagnosis not present

## 2015-12-11 DIAGNOSIS — R45850 Homicidal ideations: Secondary | ICD-10-CM

## 2015-12-11 DIAGNOSIS — Z79899 Other long term (current) drug therapy: Secondary | ICD-10-CM | POA: Diagnosis not present

## 2015-12-11 LAB — URINALYSIS, ROUTINE W REFLEX MICROSCOPIC
Bilirubin Urine: NEGATIVE
GLUCOSE, UA: NEGATIVE mg/dL
Hgb urine dipstick: NEGATIVE
KETONES UR: NEGATIVE mg/dL
NITRITE: NEGATIVE
PROTEIN: NEGATIVE mg/dL
Specific Gravity, Urine: 1.015 (ref 1.005–1.030)
pH: 6 (ref 5.0–8.0)

## 2015-12-11 LAB — COMPREHENSIVE METABOLIC PANEL
ALBUMIN: 3.8 g/dL (ref 3.5–5.0)
ALT: 16 U/L (ref 14–54)
AST: 23 U/L (ref 15–41)
Alkaline Phosphatase: 55 U/L (ref 38–126)
Anion gap: 10 (ref 5–15)
BILIRUBIN TOTAL: 0.6 mg/dL (ref 0.3–1.2)
CHLORIDE: 103 mmol/L (ref 101–111)
CO2: 29 mmol/L (ref 22–32)
CREATININE: 0.78 mg/dL (ref 0.44–1.00)
Calcium: 9.3 mg/dL (ref 8.9–10.3)
GFR calc Af Amer: >60 mL/min (ref >60)
GLUCOSE: 110 mg/dL — AB (ref 65–99)
POTASSIUM: 2.7 mmol/L — AB (ref 3.5–5.1)
Sodium: 142 mmol/L (ref 135–145)
TOTAL PROTEIN: 6.8 g/dL (ref 6.5–8.1)

## 2015-12-11 LAB — URINE MICROSCOPIC-ADD ON

## 2015-12-11 LAB — CBC WITH DIFFERENTIAL/PLATELET
BASOS ABS: 0 10*3/uL (ref 0.0–0.1)
BASOS PCT: 1 %
Eosinophils Absolute: 0.2 10*3/uL (ref 0.0–0.7)
Eosinophils Relative: 3 %
HEMATOCRIT: 37.9 % (ref 36.0–46.0)
HEMOGLOBIN: 13.1 g/dL (ref 12.0–15.0)
LYMPHS PCT: 18 %
Lymphs Abs: 1.1 10*3/uL (ref 0.7–4.0)
MCH: 29.8 pg (ref 26.0–34.0)
MCHC: 34.6 g/dL (ref 30.0–36.0)
MCV: 86.3 fL (ref 78.0–100.0)
Monocytes Absolute: 0.5 10*3/uL (ref 0.1–1.0)
Monocytes Relative: 8 %
NEUTROS ABS: 4.3 10*3/uL (ref 1.7–7.7)
NEUTROS PCT: 70 %
Platelets: 234 10*3/uL (ref 150–400)
RBC: 4.39 MIL/uL (ref 3.87–5.11)
RDW: 13.3 % (ref 11.5–15.5)
WBC: 6.1 10*3/uL (ref 4.0–10.5)

## 2015-12-11 LAB — TSH: TSH: 1.551 u[IU]/mL (ref 0.350–4.500)

## 2015-12-11 LAB — MAGNESIUM: MAGNESIUM: 1.9 mg/dL (ref 1.7–2.4)

## 2015-12-11 MED ORDER — SODIUM CHLORIDE 0.9 % IV BOLUS (SEPSIS)
500.0000 mL | Freq: Once | INTRAVENOUS | Status: AC
Start: 2015-12-11 — End: 2015-12-11
  Administered 2015-12-11: 500 mL via INTRAVENOUS

## 2015-12-11 MED ORDER — POTASSIUM CHLORIDE CRYS ER 20 MEQ PO TBCR
40.0000 meq | EXTENDED_RELEASE_TABLET | Freq: Once | ORAL | Status: AC
  Administered 2015-12-12: 40 meq via ORAL
  Filled 2015-12-11: qty 2

## 2015-12-11 MED ORDER — ALPRAZOLAM 0.25 MG PO TABS
0.5000 mg | ORAL_TABLET | Freq: Three times a day (TID) | ORAL | Status: DC | PRN
  Administered 2015-12-11 – 2015-12-13 (×4): 0.5 mg via ORAL
  Filled 2015-12-11: qty 1
  Filled 2015-12-11 (×2): qty 2
  Filled 2015-12-11: qty 1

## 2015-12-11 MED ORDER — MEMANTINE HCL ER 28 MG PO CP24
28.0000 mg | ORAL_CAPSULE | Freq: Every day | ORAL | Status: DC
  Administered 2015-12-11 – 2015-12-13 (×3): 28 mg via ORAL
  Filled 2015-12-11 (×4): qty 1

## 2015-12-11 MED ORDER — POTASSIUM CHLORIDE CRYS ER 20 MEQ PO TBCR
60.0000 meq | EXTENDED_RELEASE_TABLET | Freq: Once | ORAL | Status: AC
  Administered 2015-12-11: 60 meq via ORAL
  Filled 2015-12-11: qty 3

## 2015-12-11 MED ORDER — METOPROLOL TARTRATE 25 MG PO TABS
25.0000 mg | ORAL_TABLET | Freq: Once | ORAL | Status: AC
  Administered 2015-12-11: 25 mg via ORAL
  Filled 2015-12-11: qty 1

## 2015-12-11 MED ORDER — SIMVASTATIN 40 MG PO TABS
40.0000 mg | ORAL_TABLET | Freq: Every day | ORAL | Status: DC
  Administered 2015-12-11 – 2015-12-13 (×3): 40 mg via ORAL
  Filled 2015-12-11 (×4): qty 1

## 2015-12-11 MED ORDER — SERTRALINE HCL 50 MG PO TABS
100.0000 mg | ORAL_TABLET | Freq: Every day | ORAL | Status: DC
  Administered 2015-12-11 – 2015-12-13 (×3): 100 mg via ORAL
  Filled 2015-12-11 (×3): qty 2

## 2015-12-11 MED ORDER — LISINOPRIL 20 MG PO TABS
40.0000 mg | ORAL_TABLET | Freq: Every day | ORAL | Status: DC
  Administered 2015-12-11 – 2015-12-13 (×3): 40 mg via ORAL
  Filled 2015-12-11 (×3): qty 2
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
## 2015-12-11 NOTE — ED Notes (Signed)
Pt continues to need reorientation and redirecting. Pt searching for her personal belongings and her family.
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
## 2015-12-11 NOTE — ED Notes (Addendum)
Pt daughter [name](Linda) is taking all of pt personal belongings home including jewelry.

## 2015-12-11 NOTE — ED Notes (Signed)
Pt. Given meal tray
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
## 2015-12-11 NOTE — ED Notes (Addendum)
Patient must have pills crushed in apple sauce

## 2015-12-11 NOTE — ED Notes (Addendum)
Tempie HoistLinda Robertson (Daughter)   [phone]

## 2015-12-11 NOTE — Telephone Encounter (Signed)
Returned call and spoke to pt's daughter, Bonita QuinLinda. [facility] is [facility] being [facility].

## 2015-12-11 NOTE — ED Notes (Signed)
Staffing Psychiatrist(Sitter) Called @ [phone].

## 2015-12-11 NOTE — ED Notes (Addendum)
Pt. Daughter Larita FifeLynn reports she will take pt. Belongings home if she stays overnight.

## 2015-12-12 LAB — POTASSIUM: POTASSIUM: 3.6 mmol/L (ref 3.5–5.1)

## 2015-12-12 MED ORDER — HYDRALAZINE HCL 25 MG PO TABS
25.0000 mg | ORAL_TABLET | Freq: Three times a day (TID) | ORAL | Status: DC
  Administered 2015-12-12 – 2015-12-13 (×4): 25 mg via ORAL
  Filled 2015-12-12 (×4): qty 1

## 2015-12-12 MED ORDER — LORAZEPAM 1 MG PO TABS
1.0000 mg | ORAL_TABLET | ORAL | Status: DC | PRN
Start: 2015-12-12 — End: 2015-12-14
  Administered 2015-12-12: 1 mg via ORAL
  Filled 2015-12-12 (×2): qty 1

## 2015-12-12 MED ORDER — ALPRAZOLAM 0.5 MG PO TABS
1.0000 mg | ORAL_TABLET | Freq: Once | ORAL | Status: AC
  Administered 2015-12-12: 1 mg via ORAL
  Filled 2015-12-12: qty 2

## 2015-12-12 MED ORDER — METOPROLOL TARTRATE 25 MG PO TABS
25.0000 mg | ORAL_TABLET | Freq: Two times a day (BID) | ORAL | Status: DC
  Administered 2015-12-12 – 2015-12-13 (×3): 25 mg via ORAL
  Filled 2015-12-12 (×3): qty 1

## 2015-12-12 NOTE — ED Notes (Signed)
Patient refused to take medications right now.   Patient did well when lab drew blood.   Patient in bed and states "i can't do it right now.  You can come back later with medications".

## 2015-12-12 NOTE — ED Notes (Signed)
Spoke with Dr. Madilyn Hookees.  Okay to not wake patient for meds or Potassium.  Will give home meds and draw potassium when she wakes up.

## 2015-12-12 NOTE — ED Notes (Signed)
Pt agitated and continually attempting to "go outside to my car." Pt redirected to room several times, pt becomes more agitated when redirected. Security at bedside

## 2015-12-12 NOTE — Telephone Encounter (Signed)
The patient remains in the emergency room, she has had psychiatric evaluation, they have recommended inpatient behavioral therapy. The transition has not yet taken place, the patient does stabilize, she may still be discharged home.

## 2015-12-12 NOTE — ED Notes (Signed)
Pt was heard coughing. Nurse observed pt hiding pill in her mouth. Nurse provided information on importance of taking medication and provided water for pt to swallow. Nurse checked both of pt cheeks for pocketing to ensure medication was swallowed.

## 2015-12-12 NOTE — ED Notes (Addendum)
Once patient had shower, patient was tired and wanted to rest.   Daughter, Scarlette, came in at 1230 and patient became agitated again and went to restrooom x 3 with heavy assist.  Patient seemed more drowsy.  Daughter had tried to make patient eat part of her grilled cheese sandwich, patient took her teeth out with sandwich still lodged in the teeth.   Patient teeth placed in a cup and cleaned by her daughter.

## 2015-12-12 NOTE — ED Provider Notes (Signed)
Received call that patient is very agitated and her daily xanax is not working. No significant change from overnight but just continues to be agitated. BP up as well. Will plan to give ativan and recheck BP to ensure it is improving. Doubt hypertensive encephalopathy at this time I think the BP is from the agitation. Will reevaluate.   Marily MemosJason Shuntia Exton, MD 12/12/15 [phone]

## 2015-12-12 NOTE — ED Provider Notes (Signed)
Blood pressure has been consistently elevated and is now over 200 systolic. She had received a single dose of metoprolol. We'll continue metoprolol twice a day and add hydralazine.  Heather Boozeavid Claude Waldman, MD 12/12/15 [phone]

## 2015-12-12 NOTE — Progress Notes (Addendum)
Spoke with pt's daughter Heather Floyd [phone]. She states pt [facility] buy Dr. Anne HahnWillis. States pt has no psychiatric treatment hx, and they presume that presenting symptoms are stemming from issues related to dementia/Alzheimer's. Heather Floyd was informed about referral process for geriatric behavioral facilities. Understands that referrals have not yet been made due to pt's elevated BP. Heather Floyd states she was aware BP readings in ED have been elevated and "These readings are not normal for her. She takes hypertension medication to control her high BP, when I go to MD appointments with her it is more in the 140s/80s,90s range." Heather Floyd asks what can be done to address HTN and CSW directed her to inquire this of medical staff. Heather Floyd states she is healthcare power of attorney for pt. Is sending paperwork. Is willing to help provide consents for admission to a behavioral hospital when necessary. Heather Floyd asks if pt's medications and symptoms can be addressed OP by her current neurologist. CSW explained that, while current recommendation is that pt receive inpatient behavioral treatment, as case progresses pt will be re-evaluated so that, if symptoms stabilize, recommendation may change accordingly. Heather Floyd expresses understanding and thanks to staff for care of pt. Heather Floyd asks that she be informed once referrals proceed so that family can be prepared for possible travel arrangements, etc.   Ilean SkillMeghan Andron Marrazzo, MSW, LCSW Clinical Social Work, Disposition  12/12/2015 [phone]  POA paperwork received, retained copy for pt's chart.

## 2015-12-12 NOTE — ED Notes (Signed)
Patient sleeping at this time. A snack and drink is at bedside. A regular diet ordered for patient.

## 2015-12-12 NOTE — ED Notes (Signed)
Patient awake and agitated.  Patient had urinated in bed and on clothes.   Patient wouldn't let lab get her potassium at that time.   Will try again when patient is not as agitated.  Patient currently in shower to get cleaned up.

## 2015-12-12 NOTE — Telephone Encounter (Signed)
Returned call to pt's daughter. Heather Floyd reports difficulty getting pt's blood pressure down. She is receiving PO potassium supplement for hypokalemia. Geri-psych eval is in progress and hospital social worker Water quality scientist(Megan) is assisting w/ discharge planning and possible placement once BP is regulated. Heather Floyd agreed to call back w/ any additional questions or concerns.

## 2015-12-12 NOTE — Telephone Encounter (Addendum)
Patient's daughter returned Jennifer's call

## 2015-12-12 NOTE — Telephone Encounter (Signed)
Tried to reach daughter, Bonita QuinLinda, this morning for update but unavailable and unable to leave mssg on cell.

## 2015-12-12 NOTE — ED Notes (Signed)
Ordered Breakfast Tray  

## 2015-12-12 NOTE — ED Notes (Signed)
Patient's son is here to see her. Son wanded through security.

## 2015-12-13 ENCOUNTER — Emergency Department (HOSPITAL_COMMUNITY): Payer: Medicare Other

## 2015-12-13 MED ORDER — AMLODIPINE BESYLATE 5 MG PO TABS
2.5000 mg | ORAL_TABLET | Freq: Once | ORAL | Status: AC
  Administered 2015-12-13: 2.5 mg via ORAL
  Filled 2015-12-13: qty 1

## 2015-12-13 NOTE — ED Notes (Signed)
Visitor at bedside. Pt speaks easily and calmly. Sitter remains at bedside

## 2015-12-13 NOTE — ED Notes (Signed)
Pt awake and alert. Sitting up in bed eating breakfast and tolerating diet well. Sitter at bedside

## 2015-12-13 NOTE — ED Notes (Signed)
Pt repeatedly ask if she is going home today. Pt states her son is here for 2 days and she wants to see him. Son was at bedside earlier and states he lives here and has for years. Pt becomes tearful when I stated she might not leave today. Sitter remains at bedside and pt is distracted and the conversation is redirected.

## 2015-12-13 NOTE — ED Notes (Signed)
Pt walking in hall way with sitter

## 2015-12-13 NOTE — ED Notes (Signed)
Pt eating breakfast, sitter at bedside 

## 2015-12-13 NOTE — Progress Notes (Addendum)
Pt recommended to receive inpatient geriatric behavioral treatment. Per NP, Pt's BP has somewhat stabilized.  Pt referred to: [facility]- left voicemail for intake. Requested chest x-ray to include in referral- will send when available. [facility]- per Agustin Creearlene, geriatric beds open on unit today- faxed referral  Spoke with pt's daughter/POA Heather Floyd [phone]. Updated her that referrals have been made and are pending review at above facilities. Daughter requests to be kept updated. Also expressed general inquiries re: nature of inpatient geriatric behavioral treatment.   Ilean SkillMeghan Levar Fayson, MSW, LCSW Clinical Social Work, Disposition  12/13/2015 [phone]  12:15- [facility]

## 2015-12-13 NOTE — ED Notes (Signed)
lunch tray ordered for pt

## 2015-12-13 NOTE — ED Notes (Signed)
Pt woke up very confused and did not know where she was. Pt expressed worries about her sister Bonita QuinLinda and stated that she needed to get up and walk outside. Nurse reoriented patient to surroundings and informed her that sister would visit with her later.

## 2015-12-13 NOTE — ED Provider Notes (Signed)
  Physical Exam  BP 173/79 mmHg  Pulse 77  Temp(Src) 98.4 F (36.9 C) (Oral)  Resp 16  SpO2 94%  Physical Exam  ED Course  Procedures  [facility] by Dr Imogene Burnhen.  Reevaluated before transfer      Benjiman CoreNathan Finnley [place], MD 12/13/15 1447

## 2016-01-14 ENCOUNTER — Telehealth: Payer: Self-pay | Admitting: Neurology

## 2016-01-14 MED ORDER — AMLODIPINE BESYLATE 5 MG PO TABS: 5.0000 mg | ORAL_TABLET | Freq: Every day | ORAL | 3 refills | Status: DC

## 2016-01-14 MED ORDER — HYDROXYZINE HCL 25 MG PO TABS: 25.0000 mg | ORAL_TABLET | Freq: Three times a day (TID) | ORAL | 3 refills | Status: DC | PRN

## 2016-01-14 MED ORDER — TRAZODONE HCL 50 MG PO TABS: 50.0000 mg | ORAL_TABLET | Freq: Every day | ORAL | 3 refills | Status: DC

## 2016-01-14 MED ORDER — RISPERIDONE 0.5 MG PO TABS: 0.5000 mg | ORAL_TABLET | Freq: Every day | ORAL | 3 refills | Status: DC

## 2016-01-14 NOTE — Telephone Encounter (Signed)
Pt's daughter called to speak with Victorino Dike to know if she should go to pharmacy to p/u meds. Pls call

## 2016-01-14 NOTE — Telephone Encounter (Signed)
I called the daughter. The patient just got out [facility]. There are several medications that need to be added to her list , they have run out of the prescription. I have called in trazodone, Risperdal, hydroxyzine , and Norvasc. They will be seen early next week.

## 2016-01-14 NOTE — Telephone Encounter (Signed)
Patient's daughter states her mother was released from [facility] and doesn't have enough of her medication to last her to her next appt with Dr. [name].  She is faxing you a list of the medications that need refill.  I requested the names of the meds but she would not give them to me.  Please call her if any questions. Thanks!

## 2016-01-14 NOTE — Telephone Encounter (Signed)
Spoke to pt's daughter. Received faxed list of requested medications. However, they have not previously been prescribed by our office. Pt has f/u appt scheduled w/ Dr. [name] next week. Offered sooner appt tomorrow but daughter reports that pt will be out of her medications today.

## 2016-01-21 ENCOUNTER — Ambulatory Visit (INDEPENDENT_AMBULATORY_CARE_PROVIDER_SITE_OTHER): Payer: Medicare Other | Admitting: Neurology

## 2016-01-21 ENCOUNTER — Encounter: Payer: Self-pay | Admitting: Neurology

## 2016-01-21 VITALS — BP 158/83 | HR 81 | Ht 62.0 in | Wt 114.0 lb

## 2016-01-21 DIAGNOSIS — R413 Other amnesia: Secondary | ICD-10-CM

## 2016-01-21 DIAGNOSIS — R269 Unspecified abnormalities of gait and mobility: Secondary | ICD-10-CM | POA: Diagnosis not present

## 2016-01-21 MED ORDER — MEMANTINE HCL ER 28 MG PO CP24: 28.0000 mg | ORAL_CAPSULE | Freq: Every day | ORAL | 6 refills | Status: DC
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
## 2016-02-04 ENCOUNTER — Encounter: Payer: Self-pay | Admitting: Podiatry

## 2016-02-04 ENCOUNTER — Ambulatory Visit (INDEPENDENT_AMBULATORY_CARE_PROVIDER_SITE_OTHER): Payer: Medicare Other | Admitting: Podiatry

## 2016-02-04 DIAGNOSIS — M79676 Pain in unspecified toe(s): Secondary | ICD-10-CM | POA: Diagnosis not present

## 2016-02-04 DIAGNOSIS — B351 Tinea unguium: Secondary | ICD-10-CM | POA: Diagnosis not present

## 2016-02-04 NOTE — Progress Notes (Signed)
Patient ID: Heather Floyd, female   DOB: 04/16/1933, 82 y.o.   MRN: [id number]  Subjective: 82 year old female presents the office with concerns of thick, painful, elongated, discolored toenails for which she cannot trim herself. Denies any surrounding redness or red streaks or any drainage or other signs of infection. She has not been using anti fungal medication. No other complaints.  Objective: General: Awake, Alert, NAD  Dermatological:  Nails appear to be hypertrophic, dystrophic, brittle, discolored, elongated 10. There is no swelling erythema or drainage. There is tenderness in nails 1-5 bilaterally particularly the hallux toenails. No other open lesions or pre-ulcerative lesions identified at this time.  Vascular: Dorsalis Pedis artery and Posterior Tibial artery pedal pulses are palpable bilateral with immedate capillary fill time. There is no pain with calf compression, swelling, warmth, erythema.   Neruologic: Grossly intact via light touch bilateral. Vibratory intact via tuning fork bilateral. Protective threshold with Semmes Wienstein monofilament intact to all pedal sites bilateral.    Musculoskeletal: No gross boney pedal deformities bilateral. No pain, crepitus, or limitation noted with foot and ankle range of motion bilateral. Muscular strength 5/5 in all groups tested bilateral.  Gait: Unassisted, Nonantalgic.   Assessment:  82 year old female with symptomatic onychomycosis  Plan: -Treatment options discussed including all alternatives, risks, and complications -Etiology of symptoms were discussed -Nails debrided 10 without complications or bleeding. -Discussed the importance of daily foot inspection. -Follow-up in 3 months or sooner if any problems arise. In the meantime, encouraged to call the office with any questions, concerns, change in symptoms.   Heather Floyd, DPM

## 2016-04-06 ENCOUNTER — Other Ambulatory Visit: Payer: Self-pay | Admitting: Neurology

## 2016-05-05 ENCOUNTER — Encounter: Payer: Self-pay | Admitting: Podiatry

## 2016-05-05 ENCOUNTER — Ambulatory Visit (INDEPENDENT_AMBULATORY_CARE_PROVIDER_SITE_OTHER): Payer: Medicare Other | Admitting: Podiatry

## 2016-05-05 DIAGNOSIS — M79676 Pain in unspecified toe(s): Secondary | ICD-10-CM

## 2016-05-05 DIAGNOSIS — B351 Tinea unguium: Secondary | ICD-10-CM

## 2016-05-05 NOTE — Progress Notes (Signed)
Patient ID: Heather PandaGail M Dalpe, female   DOB: 07/16/1932, 82 y.o.   MRN: [id number]  Subjective: 82 year old female presents the office with concerns of thick, painful, elongated, discolored toenails for which she cannot trim herself. Denies any surrounding redness or red streaks or any drainage or other signs of infection. She has not been using anti fungal medication. No other complaints.  Objective: General: Awake, Alert, NAD  Dermatological:  Nails appear to be hypertrophic, dystrophic, brittle, discolored, elongated 10. There is no swelling erythema or drainage. There is tenderness in nails 1-5 bilaterally particularly the hallux toenails. No other open lesions or pre-ulcerative lesions identified at this time.  Vascular: Dorsalis Pedis artery and Posterior Tibial artery pedal pulses are palpable bilateral with immedate capillary fill time. There is no pain with calf compression, swelling, warmth, erythema.   Neruologic:  Protective threshold with Semmes Wienstein monofilament intact to all pedal sites bilateral.    Musculoskeletal:  No pain, crepitus, or limitation noted with foot and ankle range of motion bilateral. Muscular strength 5/5 in all groups tested bilateral.  Gait: Unassisted, Nonantalgic.   Assessment: 82 year old female with symptomatic onychomycosis  Plan: -Treatment options discussed including all alternatives, risks, and complications -Etiology of symptoms were discussed -Nails debrided 10 without complications or bleeding. -Discussed the importance of daily foot inspection. -Follow-up in 3 months or sooner if any problems arise. In the meantime, encouraged to call the office with any questions, concerns, change in symptoms.   Ovid CurdMatthew Wagoner, DPM

## 2016-07-19 ENCOUNTER — Other Ambulatory Visit: Payer: Self-pay | Admitting: Neurology

## 2016-07-22 ENCOUNTER — Telehealth: Payer: Self-pay | Admitting: *Deleted

## 2016-07-22 NOTE — Telephone Encounter (Signed)
Lm on vm to call and reschedule appt

## 2016-07-23 ENCOUNTER — Ambulatory Visit: Payer: Medicare Other | Admitting: Adult Health

## 2016-08-04 ENCOUNTER — Ambulatory Visit (INDEPENDENT_AMBULATORY_CARE_PROVIDER_SITE_OTHER): Payer: Medicare Other | Admitting: Podiatry

## 2016-08-04 ENCOUNTER — Encounter: Payer: Self-pay | Admitting: Podiatry

## 2016-08-04 DIAGNOSIS — M79676 Pain in unspecified toe(s): Secondary | ICD-10-CM | POA: Diagnosis not present

## 2016-08-04 DIAGNOSIS — B351 Tinea unguium: Secondary | ICD-10-CM | POA: Diagnosis not present

## 2016-08-05 NOTE — Progress Notes (Signed)
Patient ID: Heather Floyd, female   DOB: 05/05/1933, 83 y.o.   MRN: [id number]  Subjective: 83 year old female presents the office with concerns of thick, painful, elongated, discolored toenails for which she cannot trim herself. Denies any surrounding redness or red streaks or any drainage or other signs of infection. She is using topical anti-fungal medication but not seeing much result. No other complaints.  Objective: General: Awake, Alert, NAD  Dermatological:  Nails appear to be hypertrophic, dystrophic, brittle, discolored, elongated 10. There is no swelling erythema or drainage. There is tenderness in nails 1-5 bilaterally particularly the hallux toenails. No other open lesions or pre-ulcerative lesions identified at this time.  Vascular: Dorsalis Pedis artery and Posterior Tibial artery pedal pulses are palpable bilateral with immedate capillary fill time. There is no pain with calf compression, swelling, warmth, erythema.   Neruologic:  Protective threshold with Semmes Wienstein monofilament intact to all pedal sites bilateral.    Musculoskeletal:  No other areas of tenderness bilaterally.  Muscular strength 5/5 in all groups tested bilateral.  Gait: Unassisted, Nonantalgic.   Assessment: 83 year old female with symptomatic onychomycosis  Plan: -Treatment options discussed including all alternatives, risks, and complications -Etiology of symptoms were discussed -Nails debrided 10 without complications or bleeding. -Discussed the importance of daily foot inspection. -Follow-up in 3 months or sooner if any problems arise. In the meantime, encouraged to call the office with any questions, concerns, change in symptoms.   Ovid CurdMatthew Wagoner, DPM

## 2016-09-02 ENCOUNTER — Other Ambulatory Visit: Payer: Self-pay | Admitting: Nurse Practitioner

## 2016-09-16 ENCOUNTER — Encounter (INDEPENDENT_AMBULATORY_CARE_PROVIDER_SITE_OTHER): Payer: Self-pay

## 2016-09-16 ENCOUNTER — Ambulatory Visit (INDEPENDENT_AMBULATORY_CARE_PROVIDER_SITE_OTHER): Payer: Medicare Other | Admitting: Adult Health

## 2016-09-16 ENCOUNTER — Encounter: Payer: Self-pay | Admitting: Adult Health

## 2016-09-16 VITALS — BP 181/81 | HR 85 | Resp 20 | Ht 62.0 in | Wt 118.0 lb

## 2016-09-16 DIAGNOSIS — R413 Other amnesia: Secondary | ICD-10-CM

## 2016-09-16 MED ORDER — HYDROXYZINE HCL 25 MG PO TABS
25.0000 mg | ORAL_TABLET | Freq: Three times a day (TID) | ORAL | 3 refills | Status: DC | PRN
Start: 2016-09-16 — End: 2017-07-14

## 2016-09-16 MED ORDER — AMLODIPINE BESYLATE 5 MG PO TABS: 5.0000 mg | ORAL_TABLET | Freq: Every day | ORAL | 0 refills | Status: DC

## 2016-09-16 NOTE — Patient Instructions (Addendum)
Continue namenda. Memory score is stable.  If your symptoms worsen or you develop new symptoms please let us know.   

## 2016-09-16 NOTE — Progress Notes (Signed)
PATIENT: Heather Floyd DOB: [birth year]  REASON FOR VISIT: follow up- memory  HISTORY FROM: patient  HISTORY OF PRESENT ILLNESS: Heather Floyd is a 83 year old female with a history of progressive memory disturbance. She returns today for follow-up. She is currently taking Namenda and tolerating it well. The patient is here with her daughter. She reports that she has good days and bad days in regards to her memory. She lives with her daughter. Her daughter states that her children take turns staying with her. They do have a caregiver that stays with her from 7:30 AM to 4:30 PM. The patient no longer cooks. Her daughter handles all of her finances. She does require prompting with ADLs. The patient does wake frequently at night. Daughter states occasionally she will wonder around the house but she has not left the house. She sometimes also confuses day and night. They have been given her melatonin and that has offered her some benefit with her sleep. Daughter feels that she is taking trazodone but she will have to check with another sibling. She also takes Risperdal. Daughter states that occasionally she speaks about seeing people that have already past away. They are considering moving the patient to a memory unit as the family is finding it more difficult to care for her. Daughter states that there has been times that her other sister has forgotten to give her her medication. She returns today for an evaluation.  HISTORY 01/21/16: Heather Floyd is an 83 year old right-handed white female with history of a progressive memory disturbance. The patient has recently had an evaluation for agitation. The patient went to the emergency room on 12/11/2015. The patient was transferred to [facility]. The patient was wandering from home, and getting lost. The patient has had issues with agitation associated with her declining memory. She has had some paranoia, she claims the family  is trying to kill her, and the family indicates that she has threatened them. The family no longer felt comfortable managing her in the home environment. The patient was taken off of alprazolam and Risperdal was added. The patient remains on Zoloft. The patient was discharged to home. It was felt that the changes in medication has helped her agitation. She comes back to this office for an evaluation today. The family indicates that she has done quite well since coming out of [place], the agitation and the tendency to wander is much better. The patient sleeps well at night. She does have a mildly unsteady gait, she has not sustained any falls.  REVIEW OF SYSTEMS: Out of a complete 14 system review of symptoms, the patient complains only of the following symptoms, and all other reviewed systems are negative.  Frequently taking, daytime sleepiness  ALLERGIES: Allergies  Allergen Reactions  . Aspirin Other (See Comments)    Makes blood too thin  . Codeine Nausea Only    HOME MEDICATIONS: Outpatient Medications Prior to Visit  Medication Sig Dispense Refill  . amLODipine (NORVASC) 5 MG tablet TAKE ONE TABLET BY MOUTH ONCE DAILY 30 tablet 0  . fenofibrate micronized (LOFIBRA) 200 MG capsule     . hydrOXYzine (ATARAX/VISTARIL) 25 MG tablet TAKE ONE TABLET BY MOUTH THREE TIMES DAILY AS NEEDED 90 tablet 1  . lisinopril (PRINIVIL,ZESTRIL) 40 MG tablet Take 40 mg by mouth daily.    . memantine (NAMENDA XR) 28 MG CP24 24 hr capsule Take 1 capsule (28 mg total) by mouth daily. 30 capsule 6  .  risperiDONE (RISPERDAL) 0.5 MG tablet Take 1 tablet (0.5 mg total) by mouth at bedtime. 30 tablet 3  . sertraline (ZOLOFT) 100 MG tablet TAKE ONE TABLET BY MOUTH ONCE DAILY 30 tablet 6  . simvastatin (ZOCOR) 40 MG tablet Take 40 mg by mouth daily.    . traZODone (DESYREL) 50 MG tablet Take 1 tablet (50 mg total) by mouth at bedtime. 30 tablet 3  . ciclopirox (PENLAC) 8 % solution Apply topically at bedtime.  Apply over nail and surrounding skin. Apply daily over previous coat. After seven (7) days, may remove with alcohol and continue cycle. 6.6 mL 2   No facility-administered medications prior to visit.     PAST MEDICAL HISTORY: Past Medical History:  Diagnosis Date  . Abnormality of gait 09/08/2014  . Carotid artery occlusion   . Concussion with loss of consciousness 09/08/2014  . DJD (degenerative joint disease) of cervical spine   . Hyperlipidemia   . Hypertension   . Memory disturbance 03/29/2013  . Osteoporosis   . Personal history of colonic polyps   . RLS (restless legs syndrome)   . Sleep disorder   . TIA (transient ischemic attack)   . Varicose veins     PAST SURGICAL HISTORY: Past Surgical History:  Procedure Laterality Date  . ANTERIOR AND POSTERIOR VAGINAL REPAIR W/ SACROSPINOUS LIGAMENT SUSPENSION    . BREAST REDUCTION SURGERY    . CESAREAN SECTION    . ENDOVENOUS ABLATION SAPHENOUS VEIN W/ LASER    . HEMORRHOID SURGERY      FAMILY HISTORY: Family History  Problem Relation Age of Onset  . Mental illness Mother   . Alzheimer's disease Mother   . Cancer Father   . Bone cancer Father   . Dementia Sister     SOCIAL HISTORY: Social History   Social History  . Marital status: Widowed    Spouse name: N/A  . Number of children: 3  . Years of education: 12   Occupational History  . retired    Social History Main Topics  . Smoking status: Former Smoker    Quit date: 06/16/1980  . Smokeless tobacco: Never Used  . Alcohol use No  . Drug use: No  . Sexual activity: Not on file   Other Topics Concern  . Not on file   Social History Narrative   Lives at home w/ her daughter   Right-handed   Caffeine: coffee in the morning and Coke during the day      PHYSICAL EXAM  Vitals:   09/16/16 0823  BP: (!) 181/81  Pulse: 85  Resp: 20  Weight: 118 lb (53.5 kg)  Height:  (1.575 m)   Body mass index is 21.58 kg/m.  MMSE - Mini Mental State Exam  09/16/2016 01/21/2016 07/25/2015  Orientation to time 0 1 0  Orientation to Place Registration Attention/ Calculation Recall 0 0 0  Language- name 2 objects Language- repeat 0 1 1  Language- follow 3 step command Language- read & follow direction Write a sentence Copy design 0 1 0  Total score Generalized: Well developed, in no acute distress   Neurological examination  Mentation: Alert oriented to time, place, history taking. Follows all commands speech and language fluent Cranial nerve II-XII: Pupils were equal round reactive to light.  Extraocular movements were full, visual field were full on confrontational test. Facial sensation and strength were normal. Uvula tongue midline. Head turning and shoulder shrug  were normal and symmetric. Motor: The motor testing reveals 5 over 5 strength of all 4 extremities. Good symmetric motor tone is noted throughout.  Sensory: Sensory testing is intact to soft touch on all 4 extremities. No evidence of extinction is noted.  Coordination: Cerebellar testing reveals good finger-nose-finger and heel-to-shin bilaterally.  Gait and station: Gait is normal.    DIAGNOSTIC DATA (LABS, IMAGING, TESTING) - I reviewed patient records, labs, notes, testing and imaging myself where available.  Lab Results  Component Value Date   WBC 6.1 12/11/2015   HGB 13.1 12/11/2015   HCT 37.9 12/11/2015   MCV 86.3 12/11/2015   PLT 234 12/11/2015      Component Value Date/Time   NA 142 12/11/2015 1600   K 3.6 12/12/2015 1157   CL 103 12/11/2015 1600   CO2 29 12/11/2015 1600   GLUCOSE 110 (H) 12/11/2015 1600   BUN <5 (L) 12/11/2015 1600   CREATININE 0.78 12/11/2015 1600   CALCIUM 9.3 12/11/2015 1600   PROT 6.8 12/11/2015 1600   ALBUMIN 3.8 12/11/2015 1600   AST 23 12/11/2015 1600   ALT 16 12/11/2015 1600   ALKPHOS 55 12/11/2015 1600   BILITOT 0.6 12/11/2015 1600   GFRNONAA >60 12/11/2015 1600    GFRAA >60 12/11/2015 1600   Lab Results  Component Value Date   CHOL 122 10/01/2014   HDL 20 (L) 10/01/2014   LDLCALC 64 10/01/2014   TRIG 188 (H) 10/01/2014   CHOLHDL 6.1 10/01/2014   Lab Results  Component Value Date   HGBA1C 5.3 09/30/2014   Lab Results  Component Value Date   VITAMINB12 709 03/29/2013   Lab Results  Component Value Date   TSH 1.551 12/11/2015      ASSESSMENT AND PLAN 83 y.o. year old female  has a past medical history of Abnormality of gait (09/08/2014); Carotid artery occlusion; Concussion with loss of consciousness (09/08/2014); DJD (degenerative joint disease) of cervical spine; Hyperlipidemia; Hypertension; Memory disturbance (03/29/2013); Osteoporosis; Personal history of colonic polyps; RLS (restless legs syndrome); Sleep disorder; TIA (transient ischemic attack); and Varicose veins. here with:  1. Memory disturbance  The patient's memory score has remained relatively stable. She will continue on Namenda. She will continue using Risperdal and Zoloft. Daughter will check with her sister to see if she is given the patient trazodone at bedtime. The patient will continue on Vistaril as well. The patient is asking for refill on Norvasc advised that ideally this should be prescribed for her primary care. I will refill for the next month and all other prescription should come from her primary care. Patient and her daughter voiced understanding. I advised that if her symptoms worsen or she develops new symptoms they should let us know. She will follow-up in 6 months or sooner if needed.  I spent 15 minutes with the patient and her daughter 50% of this time was spent reviewing her medication.   Butch Penny, MSN, NP-C 09/16/2016, 8:53 AM [facility] [address], [address] [place], [place] [address] [phone]

## 2016-09-16 NOTE — Progress Notes (Signed)
I have read the note, and I agree with the clinical assessment and plan.  Heather Floyd   

## 2016-09-19 ENCOUNTER — Other Ambulatory Visit: Payer: Self-pay | Admitting: Adult Health

## 2016-09-23 ENCOUNTER — Telehealth: Payer: Self-pay | Admitting: Neurology

## 2016-09-23 NOTE — Telephone Encounter (Signed)
This is fine 

## 2016-09-23 NOTE — Telephone Encounter (Signed)
Spoke with daughter, [name] who stated she will be placing her mother at memory care, [facility], [place]. She stated the dr must provide order that her medications needs to be crushed due to swallowing difficulties.  This RN stated FL 2 would be done. She verbalized understanding, appreciation.

## 2016-09-23 NOTE — Telephone Encounter (Signed)
Daughter [name] (listed on DPR) called office in reference to needing FL2 paperwork filled out in order for patient to go to a home.  Daughter request to be called before filling out the form due to needing to indicate patients medication needs to be crushed.  Please call

## 2016-09-24 NOTE — Telephone Encounter (Signed)
LVM for daughter re: FL2 and requested she call back to answer questions.

## 2016-09-24 NOTE — Telephone Encounter (Signed)
Pt's daughter returned RN's call °

## 2016-09-24 NOTE — Telephone Encounter (Signed)
Returned daughter's call and asked questions re: FL2 form. Advised her this RN will give to Dr Clarisa Kindred RN for review and completion, signature by Dr [name]. She asked to be called when FL2 is ready to be picked up.

## 2016-09-25 NOTE — Telephone Encounter (Signed)
Called and left detailed message letting daughter, [name] (on [place]) know FL2 form completed and ready for pick up. Placed up front for pick up.

## 2016-11-03 ENCOUNTER — Ambulatory Visit (INDEPENDENT_AMBULATORY_CARE_PROVIDER_SITE_OTHER): Payer: Medicare Other | Admitting: Podiatry

## 2016-11-03 ENCOUNTER — Encounter: Payer: Self-pay | Admitting: Podiatry

## 2016-11-03 DIAGNOSIS — M79676 Pain in unspecified toe(s): Secondary | ICD-10-CM | POA: Diagnosis not present

## 2016-11-03 DIAGNOSIS — B351 Tinea unguium: Secondary | ICD-10-CM | POA: Diagnosis not present

## 2016-11-04 NOTE — Progress Notes (Signed)
Patient ID: Jilda PandaGail M Uecker, female   DOB: 05/12/1933, 83 y.o.   MRN: [id number]  Subjective: 83 year old female presents the office with concerns of thick, painful, elongated, discolored toenails for which she cannot trim herself. Denies any surrounding redness or red streaks or any drainage or other signs of infection. She is using topical anti-fungal medication but not seeing much result. No other complaints.  Objective: General: Awake, Alert, NAD  Dermatological:  Nails appear to be hypertrophic, dystrophic, brittle, discolored, elongated 10. There is no swelling erythema or drainage. There is tenderness in nails 1-5 bilaterally particularly the hallux toenails. No other open lesions or pre-ulcerative lesions identified at this time.  Vascular: Dorsalis Pedis artery and Posterior Tibial artery pedal pulses are palpable bilateral with immedate capillary fill time. There is no pain with calf compression, swelling, warmth, erythema.   Neruologic:  Protective threshold with Semmes Wienstein monofilament intact to all pedal sites bilateral.    Musculoskeletal:  No other areas of tenderness bilaterally.  Muscular strength 5/5 in all groups tested bilateral.  Assessment: 83 year old female with symptomatic onychomycosis  Plan: -Treatment options discussed including all alternatives, risks, and complications -Etiology of symptoms were discussed -Nails debrided 10 without complications or bleeding. -Discussed the importance of daily foot inspection. -Follow-up in 3 months or sooner if any problems arise. In the meantime, encouraged to call the office with any questions, concerns, change in symptoms.   Ovid CurdMatthew Wagoner, DPM

## 2017-01-30 ENCOUNTER — Other Ambulatory Visit: Payer: Self-pay | Admitting: Neurology

## 2017-02-05 ENCOUNTER — Ambulatory Visit: Payer: Medicare Other | Admitting: Podiatry

## 2017-02-09 ENCOUNTER — Encounter: Payer: Self-pay | Admitting: Podiatry

## 2017-02-09 ENCOUNTER — Ambulatory Visit (INDEPENDENT_AMBULATORY_CARE_PROVIDER_SITE_OTHER): Payer: Medicare Other | Admitting: Podiatry

## 2017-02-09 DIAGNOSIS — M79676 Pain in unspecified toe(s): Secondary | ICD-10-CM

## 2017-02-09 DIAGNOSIS — B351 Tinea unguium: Secondary | ICD-10-CM

## 2017-02-10 NOTE — Progress Notes (Signed)
Patient ID: Heather Floyd, female   DOB: 04-15-1933, 83 y.o.   MRN: [id number]  Subjective: 83 year old female presents the office with concerns of thick, painful, elongated, discolored toenails for which she cannot trim herself. Denies any surrounding redness or red streaks or any drainage or other signs of infection. She is using topical anti-fungal medication but not seeing much result. No other complaints.  Objective: General: Awake, Alert, NAD  Dermatological:  Nails appear to be hypertrophic, dystrophic, brittle, discolored, elongated 10. There is no swelling erythema or drainage. There is tenderness in nails 1-5 bilaterally particularly the hallux toenails. No other open lesions or pre-ulcerative lesions identified at this time.  Vascular: Dorsalis Pedis artery and Posterior Tibial artery pedal pulses are palpable bilateral with immedate capillary fill time. There is no pain with calf compression, swelling, warmth, erythema.   Neruologic:  Protective sensation intact with Semmes Wienstein monofilament  Musculoskeletal:  No other areas of tenderness bilaterally.  Muscular strength 5/5 in all groups tested bilateral.  Assessment: 83 year old female with symptomatic onychomycosis  Plan: -Treatment options discussed including all alternatives, risks, and complications -Etiology of symptoms were discussed -Nails debrided 10 without complications or bleeding. -Discussed the importance of daily foot inspection. -Follow-up in 3 months or sooner if any problems arise. In the meantime, encouraged to call the office with any questions, concerns, change in symptoms.   Ovid Curd, DPM

## 2017-02-15 IMAGING — DX DG HIP (WITH OR WITHOUT PELVIS) 2V BILAT
5 series · 5 of 5 positions shown · non-contrast
Comparison: Pelvis 03/19/2012

CLINICAL DATA: Fell outside today. Left knee pain. Difficulty
moving.

EXAM:
BILATERAL HIP (WITH PELVIS) 2 VIEWS

[pelvis ap]
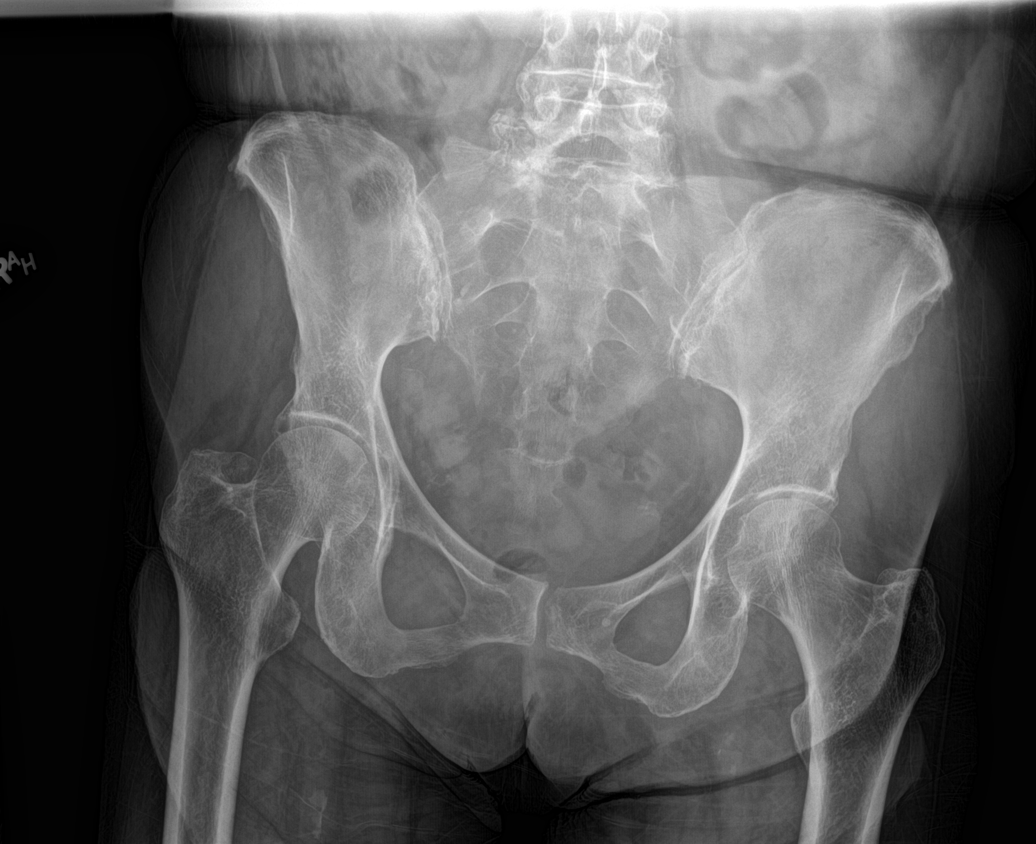

[hip ap (1 of 2)]
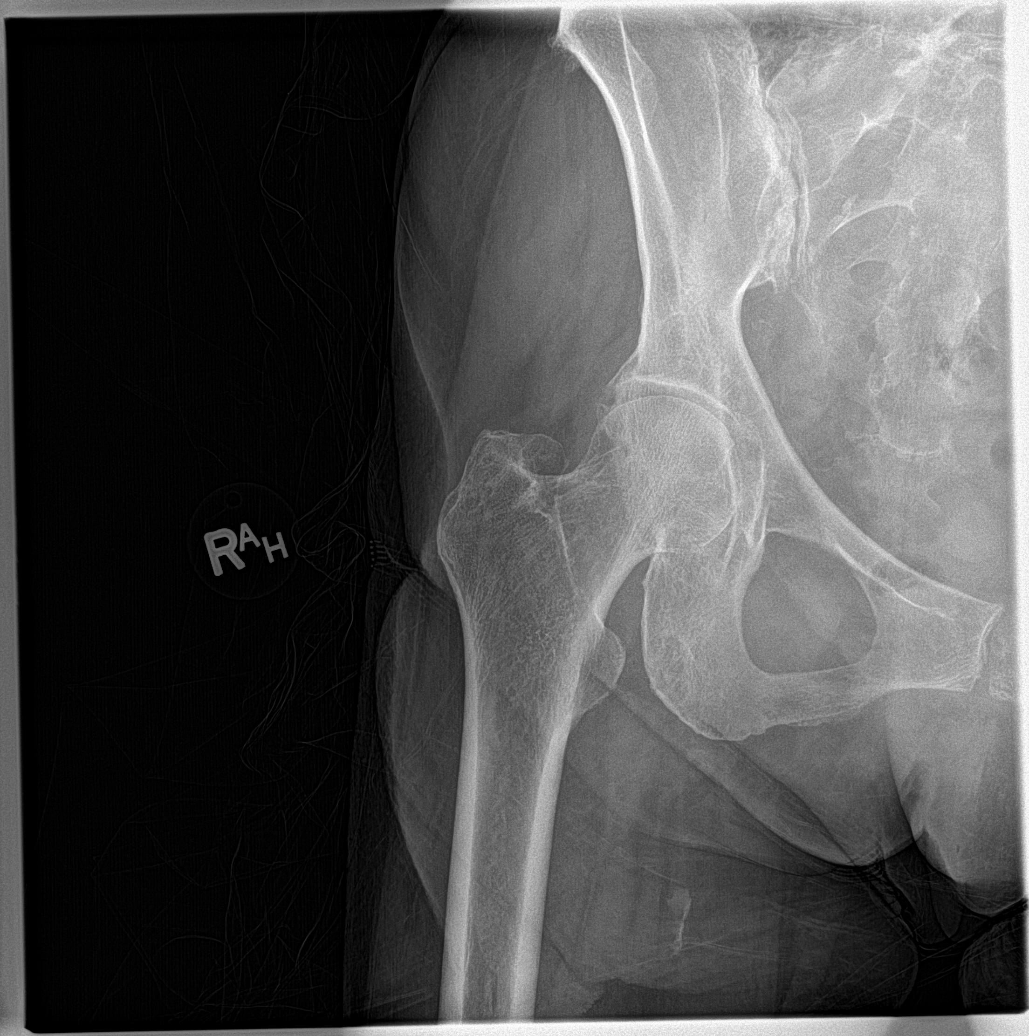

[hip lat (1 of 2)]
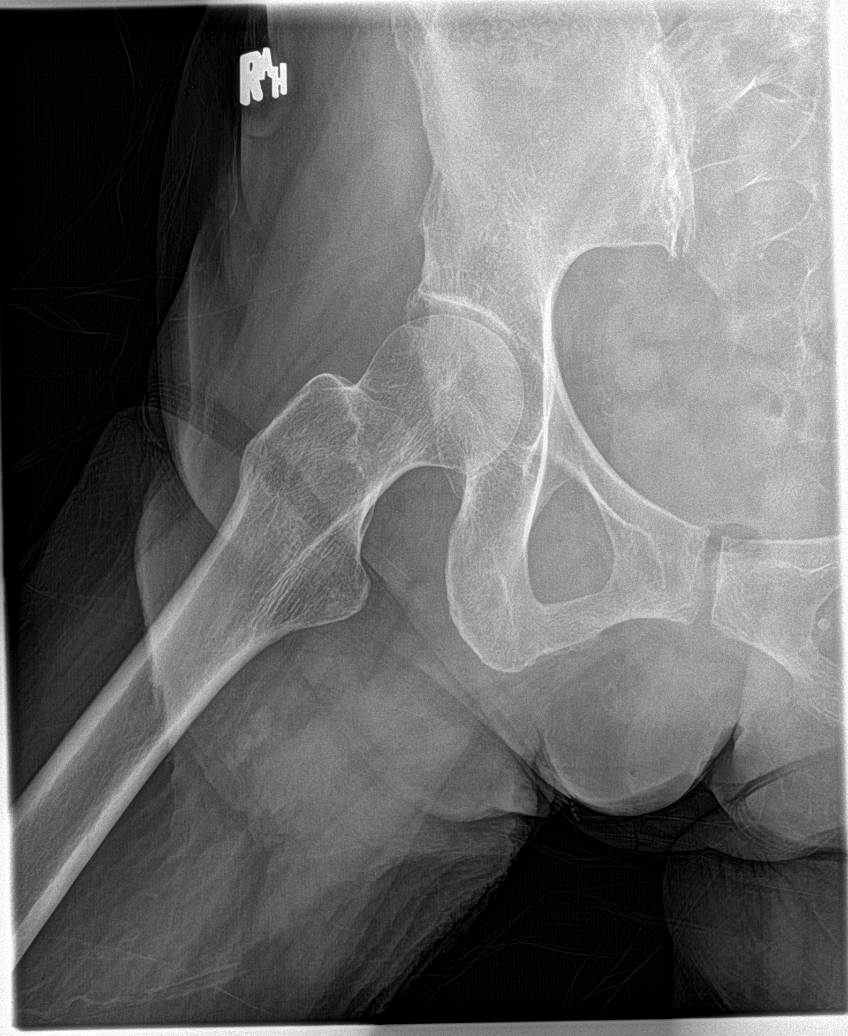

[hip ap (2 of 2)]
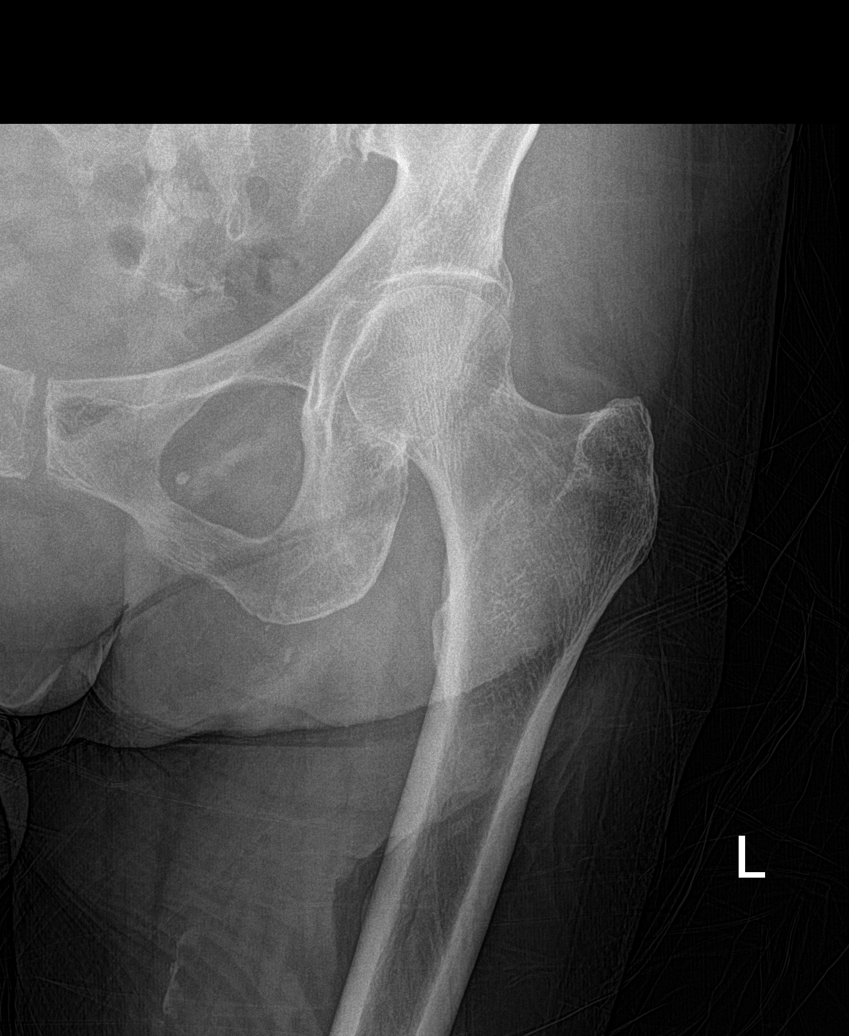

[hip lat (2 of 2)]
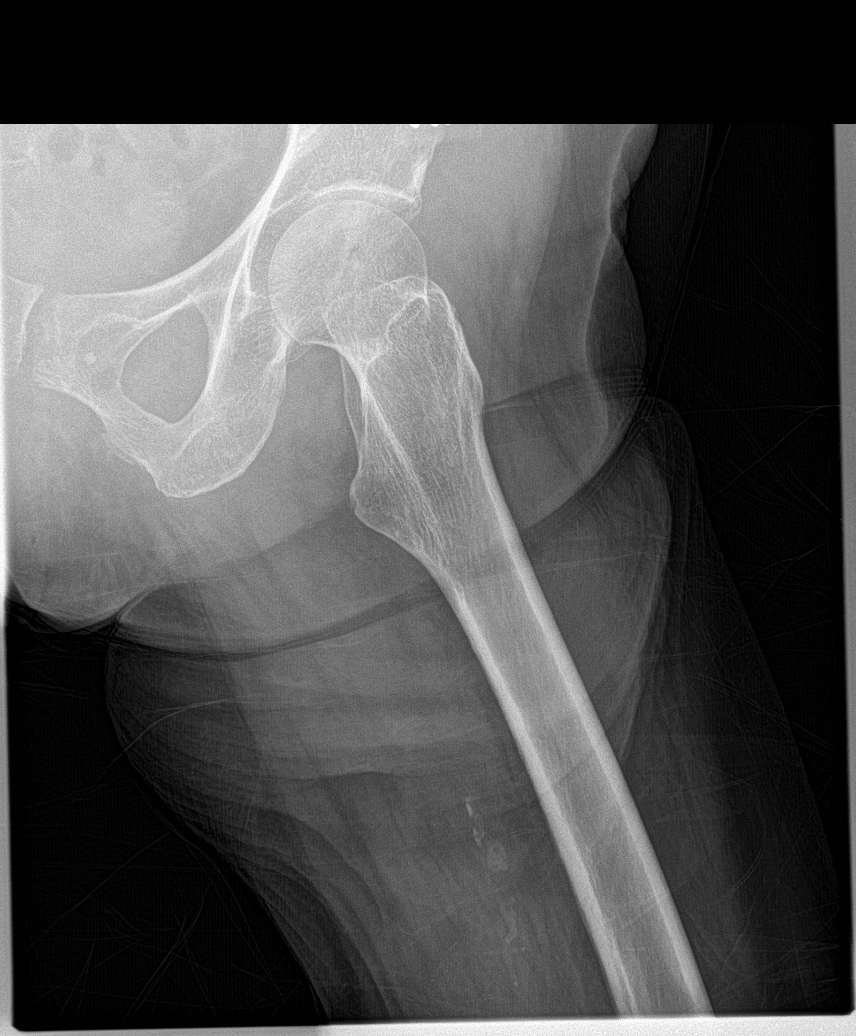

[5 of 5 positions shown; findings below may reference images not displayed]

FINDINGS: Pelvis and both hips appear intact. No acute fracture or dislocation
identified. No focal bone lesion or bone destruction. SI joints and
symphysis pubis are not displaced. Vascular calcifications are
present. Degenerative changes in the lower lumbar spine.
IMPRESSION: No acute fracture or dislocation demonstrated in the pelvis or hips.

## 2017-02-15 IMAGING — DX DG KNEE COMPLETE 4+V*R*
4 series · 4 of 4 positions shown · non-contrast
Comparison: None.

CLINICAL DATA: Fall onto left knee today with injury. Difficulty
moving the knee.

EXAM:
RIGHT KNEE - COMPLETE 4+ VIEW

[knee ap]
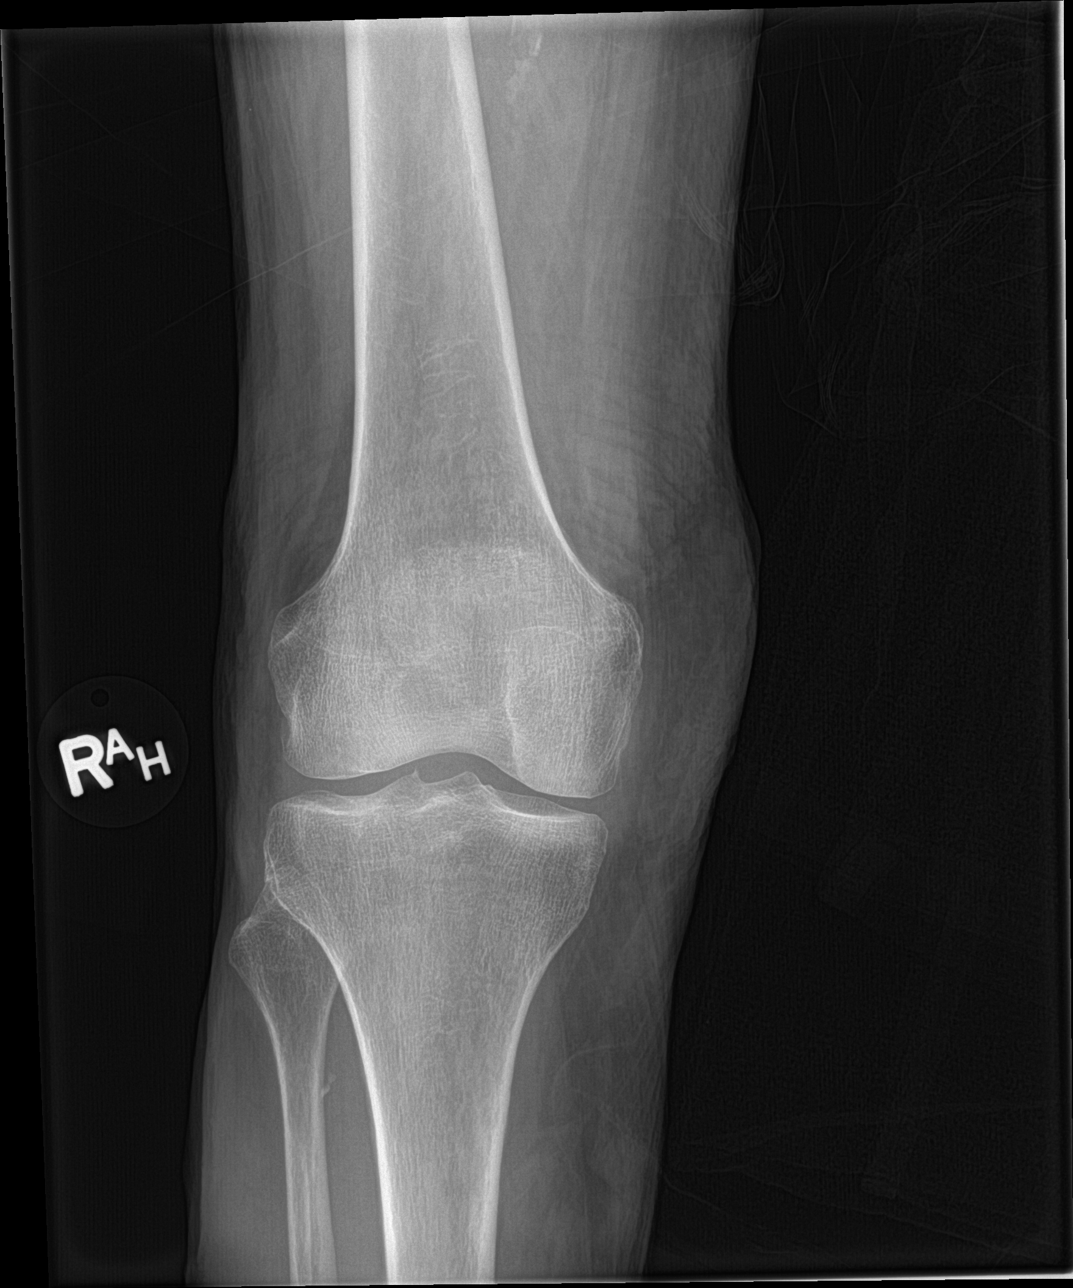

[knee obl (1 of 2)]
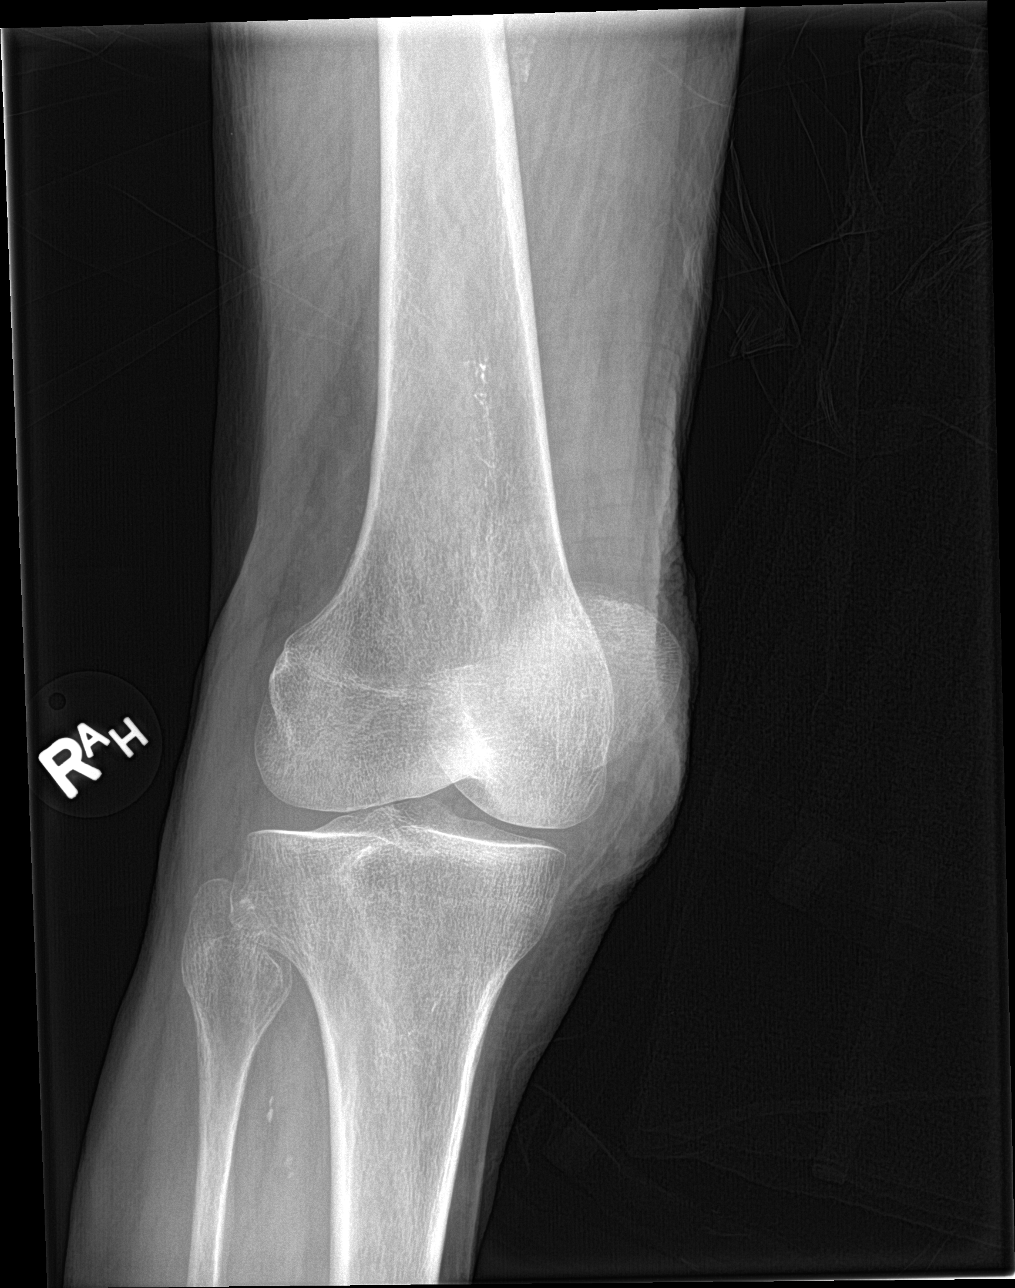

[knee obl (2 of 2)]
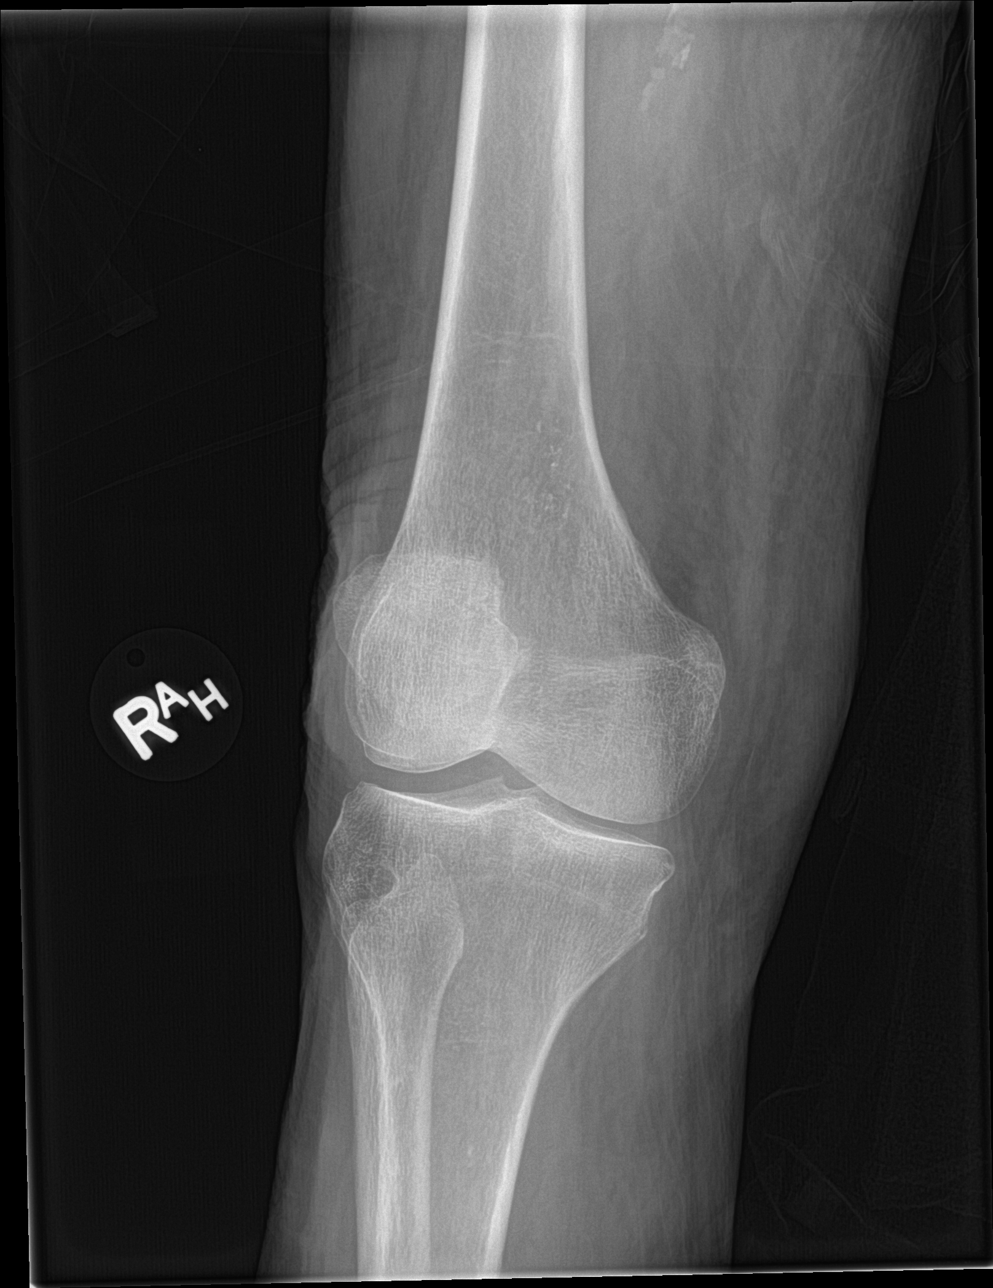

[knee lat]
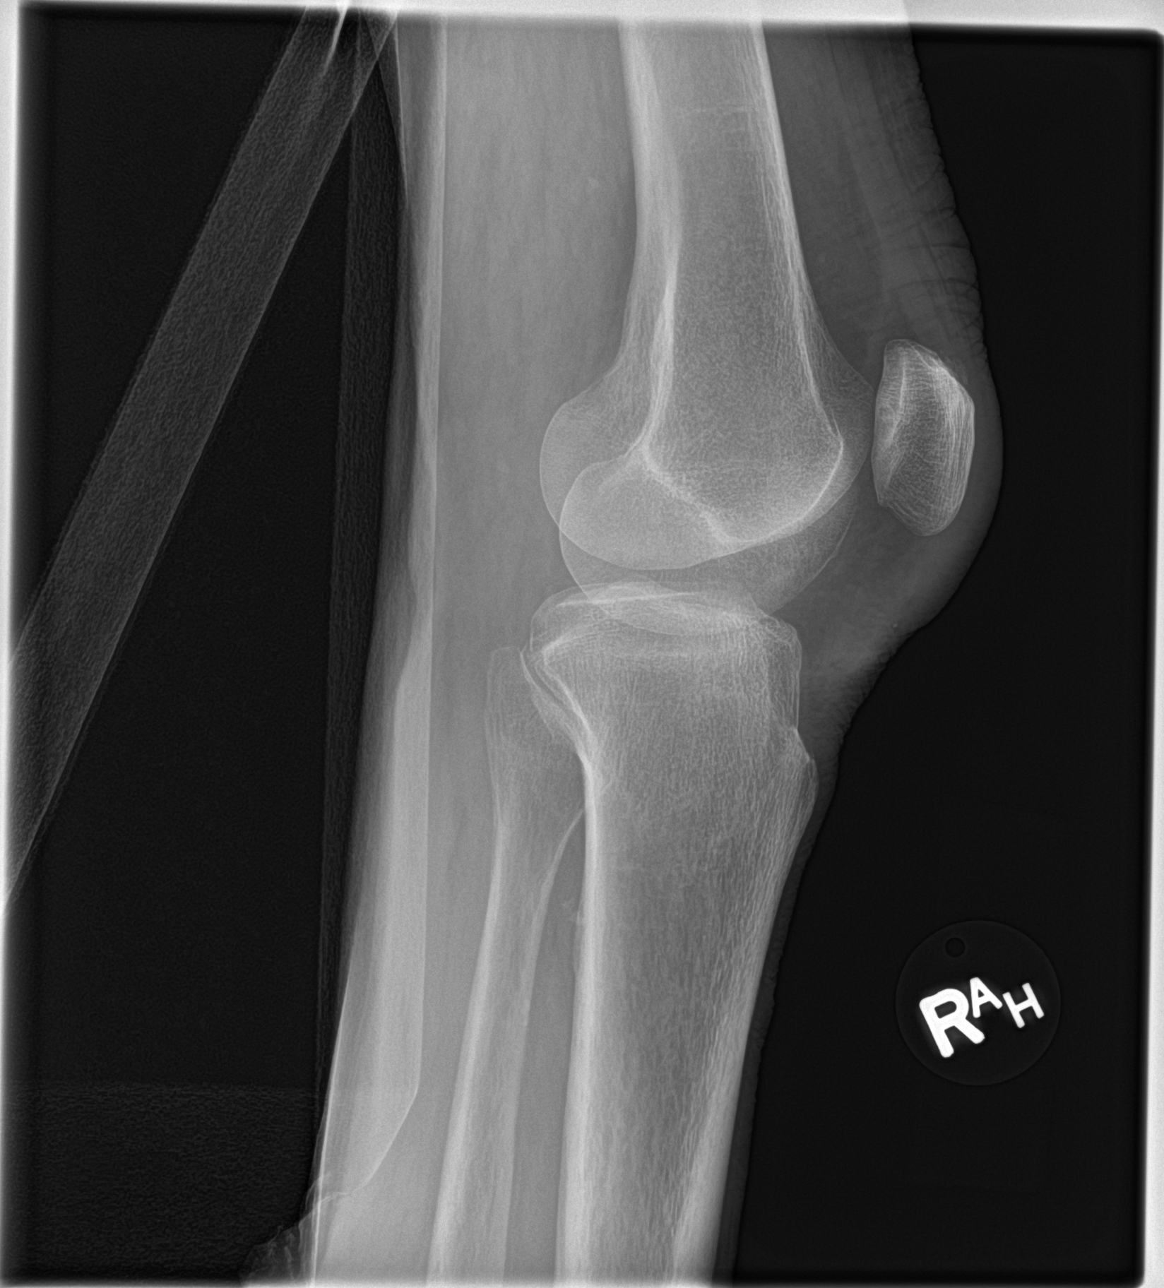

[4 of 4 positions shown; findings below may reference images not displayed]

FINDINGS: Bony demineralization. Prepatellar soft tissue swelling. Indistinct
patellar tendon without overt patella alta. Chronic appearing
spurring along the quadriceps attachment site to the patella. No
overt knee effusion.
IMPRESSION: 1. Prepatellar edema or bursitis. Indistinct patellar tendon, but
without patella alta to suggest rupture of the patellar tendon.
Correlate with any patellar laxity at physical exam.
2. Bony demineralization.

## 2017-03-16 ENCOUNTER — Emergency Department (HOSPITAL_COMMUNITY): Payer: Medicare Other

## 2017-03-16 ENCOUNTER — Encounter (HOSPITAL_COMMUNITY): Payer: Self-pay | Admitting: Emergency Medicine

## 2017-03-16 ENCOUNTER — Emergency Department (HOSPITAL_COMMUNITY)
Admission: EM | Admit: 2017-03-16 | Discharge: 2017-03-16 | Disposition: A | Discharge status: Home / Self Care | Payer: Medicare Other | Attending: Emergency Medicine | Admitting: Emergency Medicine

## 2017-03-16 DIAGNOSIS — R51 Headache: Secondary | ICD-10-CM | POA: Insufficient documentation

## 2017-03-16 DIAGNOSIS — F0390 Unspecified dementia without behavioral disturbance: Secondary | ICD-10-CM | POA: Diagnosis not present

## 2017-03-16 DIAGNOSIS — Z87891 Personal history of nicotine dependence: Secondary | ICD-10-CM | POA: Diagnosis not present

## 2017-03-16 DIAGNOSIS — Z8673 Personal history of transient ischemic attack (TIA), and cerebral infarction without residual deficits: Secondary | ICD-10-CM | POA: Diagnosis not present

## 2017-03-16 DIAGNOSIS — R4182 Altered mental status, unspecified: Secondary | ICD-10-CM | POA: Diagnosis present

## 2017-03-16 DIAGNOSIS — R404 Transient alteration of awareness: Secondary | ICD-10-CM | POA: Insufficient documentation

## 2017-03-16 DIAGNOSIS — Z79899 Other long term (current) drug therapy: Secondary | ICD-10-CM | POA: Insufficient documentation

## 2017-03-16 DIAGNOSIS — I10 Essential (primary) hypertension: Secondary | ICD-10-CM | POA: Diagnosis not present

## 2017-03-16 LAB — CBC WITH DIFFERENTIAL/PLATELET
BASOS ABS: 0 10*3/uL (ref 0.0–0.1)
BASOS PCT: 0 %
Eosinophils Absolute: 0.3 10*3/uL (ref 0.0–0.7)
Eosinophils Relative: 4 %
HEMATOCRIT: 41.2 % (ref 36.0–46.0)
HEMOGLOBIN: 14.2 g/dL (ref 12.0–15.0)
LYMPHS PCT: 18 %
Lymphs Abs: 1.3 10*3/uL (ref 0.7–4.0)
MCH: 29.2 pg (ref 26.0–34.0)
MCHC: 34.5 g/dL (ref 30.0–36.0)
MCV: 84.6 fL (ref 78.0–100.0)
MONO ABS: 0.6 10*3/uL (ref 0.1–1.0)
MONOS PCT: 8 %
NEUTROS ABS: 5 10*3/uL (ref 1.7–7.7)
NEUTROS PCT: 70 %
Platelets: 200 10*3/uL (ref 150–400)
RBC: 4.87 MIL/uL (ref 3.87–5.11)
RDW: 13.4 % (ref 11.5–15.5)
WBC: 7.1 10*3/uL (ref 4.0–10.5)

## 2017-03-16 LAB — BASIC METABOLIC PANEL
ANION GAP: 9 (ref 5–15)
BUN: 7 mg/dL (ref 6–20)
CO2: 27 mmol/L (ref 22–32)
Calcium: 9.1 mg/dL (ref 8.9–10.3)
Chloride: 103 mmol/L (ref 101–111)
Creatinine, Ser: 0.66 mg/dL (ref 0.44–1.00)
GLUCOSE: 120 mg/dL — AB (ref 65–99)
POTASSIUM: 2.9 mmol/L — AB (ref 3.5–5.1)
Sodium: 139 mmol/L (ref 135–145)

## 2017-03-16 LAB — URINALYSIS, ROUTINE W REFLEX MICROSCOPIC
Bilirubin Urine: NEGATIVE
GLUCOSE, UA: NEGATIVE mg/dL
Hgb urine dipstick: NEGATIVE
KETONES UR: NEGATIVE mg/dL
LEUKOCYTES UA: NEGATIVE
NITRITE: NEGATIVE
PH: 5 (ref 5.0–8.0)
Protein, ur: NEGATIVE mg/dL
SPECIFIC GRAVITY, URINE: 1.016 (ref 1.005–1.030)

## 2017-03-16 LAB — BRAIN NATRIURETIC PEPTIDE: B NATRIURETIC PEPTIDE 5: 53.5 pg/mL (ref 0.0–100.0)

## 2017-03-16 LAB — CBG MONITORING, ED: GLUCOSE-CAPILLARY: 115 mg/dL — AB (ref 65–99)

## 2017-03-16 NOTE — ED Triage Notes (Signed)
Patient from home where she lives with family due to history of alzheimer's disease.  Family called 911 after finding patient unresponsive at 0800 this morning after she was normal at 0630.  Patient responsive to painful stimuli with [facility] at first, resolved and patient at baseline per family by the time patient arrived to ED.  Moves all extremities and grip strength equal bilaterally, unable to lift right arm above shoulder due to pain, family states this is an ongoing issue.  Patient in no apparent distress at this time.

## 2017-03-16 NOTE — ED Notes (Signed)
CBG: 115 

## 2017-03-16 NOTE — Discharge Instructions (Signed)
As discussed, your evaluation today has been largely reassuring.  But, it is important that you monitor your condition carefully, and do not hesitate to return to the ED if you develop new, or concerning changes in your condition. ? ?Otherwise, please follow-up with your physician for appropriate ongoing care. ? ?

## 2017-03-16 NOTE — ED Provider Notes (Signed)
[facility]EM[facility]ENCY DEPT Provider Note   CSN: [id number] Arrival date & time: 03/16/17  4098     History   Chief Complaint No chief complaint on file.   HPI Heather Floyd is a 83 y.o. female.  HPI  Patient presents from home via EMS. History provided by EMS providers as the patient has dementia, level V caveat. The patient herself is perseverative on take care of her dog, once to go home. She denies pain, is unsure why she is here. EMS providers note that the patient has baseline confusion and dementia. Reportedly the patient was difficult to awaken this morning.  After awakening, the patient was less interactive than usual. Reportedly this all improved en route, and during transport the patient was awake and alert, interactive, without complaints.   Past Medical History:  Diagnosis Date  . Abnormality of gait 09/08/2014  . Carotid artery occlusion   . Concussion with loss of consciousness 09/08/2014  . DJD (degenerative joint disease) of cervical spine   . Hyperlipidemia   . Hypertension   . Memory disturbance 03/29/2013  . Osteoporosis   . Personal history of colonic polyps   . RLS (restless legs syndrome)   . Sleep disorder   . TIA (transient ischemic attack)   . Varicose veins     Patient Active Problem List   Diagnosis Date Noted  . Abnormality of gait 09/08/2014  . Concussion with loss of consciousness 09/08/2014  . Subdural hematoma (HCC) 08/11/2014  . Hypokalemia 08/11/2014  . Patellar fracture 08/11/2014  . SDH (subdural hematoma) (HCC) 08/11/2014  . Hyperlipidemia   . Hypertension   . RLS (restless legs syndrome)   . DJD (degenerative joint disease) of cervical spine   . TIA (transient ischemic attack)   . Carotid artery occlusion   . Essential hypertension   . Memory disturbance 03/29/2013  . Near syncope 01/13/2013  . Accelerated hypertension 01/13/2013  . Hyperkalemia 01/13/2013    Past Surgical History:  Procedure Laterality Date  . ANTERIOR  AND POSTERIOR VAGINAL REPAIR W/ SACROSPINOUS LIGAMENT SUSPENSION    . BREAST REDUCTION SURGERY    . CESAREAN SECTION    . ENDOVENOUS ABLATION SAPHENOUS VEIN W/ LASER    . HEMORRHOID SURGERY      OB History    No data available       Home Medications    Prior to Admission medications   Medication Sig Start Date End Date Taking? Authorizing Provider  amLODipine (NORVASC) 5 MG tablet Take 1 tablet (5 mg total) by mouth daily. 09/16/16   Butch Penny, NP  fenofibrate micronized (LOFIBRA) 200 MG capsule  06/18/15   [provider]  hydrOXYzine (ATARAX/VISTARIL) 25 MG tablet Take 1 tablet (25 mg total) by mouth 3 (three) times daily as needed. 09/16/16   Butch Penny, NP  lisinopril (PRINIVIL,ZESTRIL) 40 MG tablet Take 40 mg by mouth daily.    [provider]  memantine (NAMENDA XR) 28 MG CP24 24 hr capsule TAKE ONE CAPSULE BY MOUTH ONCE DAILY 02/02/17   [name], MD  risperiDONE (RISPERDAL) 0.5 MG tablet TAKE ONE TABLET BY MOUTH AT BEDTIME 02/02/17   [name], MD  sertraline (ZOLOFT) 100 MG tablet TAKE ONE TABLET BY MOUTH ONCE DAILY 09/03/16   [name], MD  simvastatin (ZOCOR) 40 MG tablet Take 40 mg by mouth daily.    [provider]  traZODone (DESYREL) 50 MG tablet Take 1 tablet (50 mg total) by mouth at bedtime. 01/14/16  [name], MD    Family History Family History  Problem Relation Age of Onset  . Mental illness Mother   . Alzheimer's disease Mother   . Cancer Father   . Bone cancer Father   . Dementia Sister     Social History Social History  Substance Use Topics  . Smoking status: Former Smoker    Quit date: 06/16/1980  . Smokeless tobacco: Never Used  . Alcohol use No     Allergies   Aspirin and Codeine   Review of Systems Review of Systems  Unable to perform ROS: Dementia     Physical Exam Updated Vital Signs There were no vitals taken for this visit.  Physical Exam  Constitutional: She has  a sickly appearance. No distress.  HENT:  Head: Normocephalic and atraumatic.  Eyes: Conjunctivae and EOM are normal.  Cardiovascular: Normal rate and regular rhythm.   Pulmonary/Chest: Effort normal and breath sounds normal. No stridor. No respiratory distress.  Abdominal: She exhibits no distension.  Musculoskeletal: She exhibits no edema.  Neurological: She is alert. She is disoriented. She displays atrophy. She displays no tremor. No cranial nerve deficit. She exhibits normal muscle tone. She displays no seizure activity.  Skin: Skin is warm and dry.  Psychiatric: Cognition and memory are impaired.  Nursing note and vitals reviewed.    ED Treatments / Results  Labs (all labs ordered are listed, but only abnormal results are displayed) Labs Reviewed  BASIC METABOLIC PANEL - Abnormal; Notable for the following:       Result Value   Potassium 2.9 (*)    Glucose, Bld 120 (*)    All other components within normal limits  CBG MONITORING, ED - Abnormal; Notable for the following:    Glucose-Capillary 115 (*)    All other components within normal limits  CBC WITH DIFFERENTIAL/PLATELET  URINALYSIS, ROUTINE W REFLEX MICROSCOPIC  BRAIN NATRIURETIC PEPTIDE     Radiology Dg Chest 2 View  Result Date: 03/16/2017 CLINICAL DATA:  Possible pleural effusion. EXAM: CHEST  2 VIEW COMPARISON:  Chest x-ray from same date. FINDINGS: Borderline enlarged cardiomediastinal silhouette. Atherosclerotic calcification of the aortic arch. Normal pulmonary vascularity. Left basilar atelectasis. No focal consolidation, pleural effusion, or pneumothorax. No acute osseous abnormality. IMPRESSION: No active cardiopulmonary disease. Electronically Signed   By: Obie Dredge M.D.   On: 03/16/2017 11:48   Ct Head Wo Contrast  Result Date: 03/16/2017 CLINICAL DATA:  Altered level of consciousness. History of dementia. EXAM: CT HEAD WITHOUT CONTRAST TECHNIQUE: Contiguous axial images were obtained from the base  of the skull through the vertex without intravenous contrast. COMPARISON:  Head CT 12/11/2015 FINDINGS: Brain: Stable age related cerebral atrophy, ventriculomegaly and periventricular white matter disease. No extra-axial fluid collections are identified. No CT findings for acute hemispheric infarction or intracranial hemorrhage. No mass lesions. The brainstem and cerebellum are normal. Vascular: No hyperdense vessels or definite aneurysm. Stable vascular calcifications. Skull: No skull fracture or bone lesion. Sinuses/Orbits: The paranasal sinuses and mastoid air cells are clear. The globes are intact. Other: No scalp lesions or hematoma. IMPRESSION: 1. Stable age related cerebral atrophy, ventriculomegaly and periventricular white matter disease. 2. No acute intracranial findings or skull fracture. Electronically Signed   By: Rudie Meyer M.D.   On: 03/16/2017 11:30   Dg Chest Port 1 View  Result Date: 03/16/2017 CLINICAL DATA:  Altered level of consciousness.  Hypertension. EXAM: PORTABLE CHEST 1 VIEW COMPARISON:  December 13, 2015 FINDINGS: There is atelectatic  change in the left base with small left pleural effusion. There is also mild right base atelectasis. Lungs elsewhere clear. Heart is upper normal in size with pulmonary vascularity within normal limits. No adenopathy. There is aortic atherosclerosis. No evident bone lesions. IMPRESSION: Small left pleural effusion with bibasilar atelectasis. No consolidation. Stable cardiac silhouette. There is aortic atherosclerosis. Aortic Atherosclerosis (ICD10-I70.0). Electronically Signed   By: Bretta Bang III M.D.   On: 03/16/2017 09:26    Procedures Procedures (including critical care time)  Medications Ordered in ED Medications - No data to display   Initial Impression / Assessment and Plan / ED Course  I have reviewed the triage vital signs and the nursing notes.  Pertinent labs & imaging results that were available during my care of the  patient were reviewed by me and considered in my medical decision making (see chart for details).  1:15 PM Patient awake.  No new complaints.  I've discussed all findings with family.  They had requested head CT due to observations of the patient complaining of head pain (which she currently denies) as well as the episode of AMS.  Throughout her ED course she has had no complaints, remains well appearing.  We discussed the importance of close monitoring, PMD f/u and return precautions. With generally reassuring findings, no complaints currently, and no e/o distress, patient d/c in stable condition.  Final Clinical Impressions(s) / ED Diagnoses  Altered mental status   Gerhard Munch, MD 03/16/17 1319

## 2017-03-23 ENCOUNTER — Ambulatory Visit (INDEPENDENT_AMBULATORY_CARE_PROVIDER_SITE_OTHER): Payer: Medicare Other | Admitting: Adult Health

## 2017-03-23 ENCOUNTER — Encounter: Payer: Self-pay | Admitting: Adult Health

## 2017-03-23 VITALS — BP 169/92 | HR 91 | Ht 62.0 in | Wt 124.4 lb

## 2017-03-23 DIAGNOSIS — R413 Other amnesia: Secondary | ICD-10-CM

## 2017-03-23 DIAGNOSIS — H6123 Impacted cerumen, bilateral: Secondary | ICD-10-CM | POA: Diagnosis not present

## 2017-03-23 DIAGNOSIS — M79642 Pain in left hand: Secondary | ICD-10-CM

## 2017-03-23 NOTE — Progress Notes (Signed)
PATIENT: Heather Floyd DOB: 09/18/1932  REASON FOR VISIT: follow up HISTORY FROM: patient  HISTORY OF PRESENT ILLNESS: Today 03/23/17  [name] is an 83 year old female with a history of progressive memory disturbance. She returns today for follow-up. She is currently taking Namenda and tolerating it well. Her daughter reports that she was in the hospital earlier this week. Reports that she was hard to arouse one morning so they called EMS. When she arrived at the hospital she was back at her baseline. They did do a CT of the head and chest x-ray that was unremarkable. The patient has a caregiver that is with her during the day and her daughter stays with her at night. The patient is able to complete most ADLs independently but she does require some prompting. Daughter reports that she has a hard time hearing in the left ear. She also states that her mom reports that the left hand hurts. Patient has not had any trauma to the hand. She is also noticed that her mom has been sleeping more throughout the day. She returns today for an evaluation.  HISTORY 09/16/16: [name] is a 83 year old female with a history of progressive memory disturbance. She returns today for follow-up. She is currently taking Namenda and tolerating it well. The patient is here with her daughter. She reports that she has good days and bad days in regards to her memory. She lives with her daughter. Her daughter states that her children take turns staying with her. They do have a caregiver that stays with her from 7:30 AM to 4:30 PM. The patient no longer cooks. Her daughter handles all of her finances. She does require prompting with ADLs. The patient does wake frequently at night. Daughter states occasionally she will wonder around the house but she has not left the house. She sometimes also confuses day and night. They have been given her melatonin and that has offered her some benefit with her sleep. Daughter feels that she  is taking trazodone but she will have to check with another sibling. She also takes Risperdal. Daughter states that occasionally she speaks about seeing people that have already past away. They are considering moving the patient to a memory unit as the family is finding it more difficult to care for her. Daughter states that there has been times that her other sister has forgotten to give her her medication. She returns today for an evaluation.   REVIEW OF SYSTEMS: Out of a complete 14 system review of symptoms, the patient complains only of the following symptoms, and all other reviewed systems are negative.   See history of present illness  ALLERGIES: Allergies  Allergen Reactions  . Aspirin Other (See Comments)    Makes blood too thin  . Codeine Nausea Only    HOME MEDICATIONS: Outpatient Medications Prior to Visit  Medication Sig Dispense Refill  . amLODipine (NORVASC) 5 MG tablet Take 1 tablet (5 mg total) by mouth daily. 30 tablet 0  . bisacodyl (DULCOLAX) 5 MG EC tablet Take 5 mg by mouth 2 (two) times a week.    . Cholecalciferol (VITAMIN D) 2000 units CAPS Take 2,000 Units by mouth daily.    . hydrOXYzine (ATARAX/VISTARIL) 25 MG tablet Take 1 tablet (25 mg total) by mouth 3 (three) times daily as needed. (Patient taking differently: Take 25 mg by mouth 3 (three) times daily as needed for anxiety. ) 90 tablet 3  . lisinopril (PRINIVIL,ZESTRIL) 40 MG tablet Take 40  mg by mouth daily.    . memantine (NAMENDA XR) 28 MG CP24 24 hr capsule TAKE ONE CAPSULE BY MOUTH ONCE DAILY 30 capsule 6  . Multiple Vitamin (MULTIVITAMIN) tablet Take 1 tablet by mouth daily.    . potassium chloride SA (K-DUR,KLOR-CON) 20 MEQ tablet Take 20 mEq by mouth 2 (two) times daily.    . risperiDONE (RISPERDAL) 0.5 MG tablet TAKE ONE TABLET BY MOUTH AT BEDTIME (Patient taking differently: TAKE  BY MOUTH TWICE DAILY) 30 tablet 3  . rosuvastatin (CRESTOR) 20 MG tablet Take 20 mg by mouth daily.    .  sertraline (ZOLOFT) 100 MG tablet TAKE ONE TABLET BY MOUTH ONCE DAILY 30 tablet 6  . traZODone (DESYREL) 50 MG tablet Take 1 tablet (50 mg total) by mouth at bedtime. 30 tablet 3  . vitamin B-12 (CYANOCOBALAMIN) 100 MCG tablet Take 100 mcg by mouth daily.     No facility-administered medications prior to visit.     PAST MEDICAL HISTORY: Past Medical History:  Diagnosis Date  . Abnormality of gait 09/08/2014  . Carotid artery occlusion   . Concussion with loss of consciousness 09/08/2014  . DJD (degenerative joint disease) of cervical spine   . Hyperlipidemia   . Hypertension   . Memory disturbance 03/29/2013  . Osteoporosis   . Personal history of colonic polyps   . RLS (restless legs syndrome)   . Sleep disorder   . TIA (transient ischemic attack)   . Varicose veins     PAST SURGICAL HISTORY: Past Surgical History:  Procedure Laterality Date  . ANTERIOR AND POSTERIOR VAGINAL REPAIR W/ SACROSPINOUS LIGAMENT SUSPENSION    . BREAST REDUCTION SURGERY    . CESAREAN SECTION    . ENDOVENOUS ABLATION SAPHENOUS VEIN W/ LASER    . HEMORRHOID SURGERY      FAMILY HISTORY: Family History  Problem Relation Age of Onset  . Mental illness Mother   . Alzheimer's disease Mother   . Cancer Father   . Bone cancer Father   . Dementia Sister     SOCIAL HISTORY: Social History   Social History  . Marital status: Widowed    Spouse name: N/A  . Number of children: 3  . Years of education: 12   Occupational History  . retired    Social History Main Topics  . Smoking status: Former Smoker    Quit date: 06/16/1980  . Smokeless tobacco: Never Used  . Alcohol use No  . Drug use: No  . Sexual activity: Not on file   Other Topics Concern  . Not on file   Social History Narrative   Lives at home w/ her daughter   Right-handed   Caffeine: coffee in the morning and Coke during the day      PHYSICAL EXAM  Vitals:   03/23/17 1437  BP: (!) 169/92  Pulse: 91  Weight: 124 lb  6.4 oz (56.4 kg)  Height:  (1.575 m)   Body mass index is 22.75 kg/m. MMSE - Mini Mental State Exam 03/23/2017 09/16/2016 01/21/2016  Orientation to time 0 0 1  Orientation to Place Registration Attention/ Calculation 0 5 1  Recall 0 0 0  Language- name 2 objects Language- repeat 1 0 1  Language- follow 3 step command Language- read & follow direction Write a sentence Copy design 0 0 1  Total score Generalized: Well developed, in no acute distress HEENT: Both Ear Canals impacted with cerumen.    Neurological examination  Mentation: Alert oriented to time, place, history taking. Follows all commands speech and language fluent Cranial nerve II-XII: Pupils were equal round reactive to light. Extraocular movements were full, visual field were full on confrontational test. Facial sensation and strength were normal. Uvula tongue midline. Head turning and shoulder shrug  were normal and symmetric. Motor: The motor testing reveals 5 over 5 strength of all 4 extremities. Good symmetric motor tone is noted throughout.  Sensory: Sensory testing is intact to soft touch on all 4 extremities. No evidence of extinction is noted.  Coordination: Cerebellar testing reveals good finger-nose-finger and heel-to-shin bilaterally.  Gait and station: Gait is normal.  Reflexes: Deep tendon reflexes are symmetric and normal bilaterally.   DIAGNOSTIC DATA (LABS, IMAGING, TESTING) - I reviewed patient records, labs, notes, testing and imaging myself where available.  Lab Results  Component Value Date   WBC 7.1 03/16/2017   HGB 14.2 03/16/2017   HCT 41.2 03/16/2017   MCV 84.6 03/16/2017   PLT 200 03/16/2017      Component Value Date/Time   NA 139 03/16/2017 0919   K 2.9 (L) 03/16/2017 0919   CL 103 03/16/2017 0919   CO2 27 03/16/2017 0919   GLUCOSE 120 (H) 03/16/2017 0919   BUN 7 03/16/2017 0919   CREATININE 0.66 03/16/2017 0919   CALCIUM  9.1 03/16/2017 0919   PROT 6.8 12/11/2015 1600   ALBUMIN 3.8 12/11/2015 1600   AST 23 12/11/2015 1600   ALT 16 12/11/2015 1600   ALKPHOS 55 12/11/2015 1600   BILITOT 0.6 12/11/2015 1600   GFRNONAA >60 03/16/2017 0919   GFRAA >60 03/16/2017 0919   Lab Results  Component Value Date   CHOL 122 10/01/2014   HDL 20 (L) 10/01/2014   LDLCALC 64 10/01/2014   TRIG 188 (H) 10/01/2014   CHOLHDL 6.1 10/01/2014   Lab Results  Component Value Date   HGBA1C 5.3 09/30/2014   Lab Results  Component Value Date   VITAMINB12 709 03/29/2013   Lab Results  Component Value Date   TSH 1.551 12/11/2015      ASSESSMENT AND PLAN 83 y.o. year old female  has a past medical history of Abnormality of gait (09/08/2014); Carotid artery occlusion; Concussion with loss of consciousness (09/08/2014); DJD (degenerative joint disease) of cervical spine; Hyperlipidemia; Hypertension; Memory disturbance (03/29/2013); Osteoporosis; Personal history of colonic polyps; RLS (restless legs syndrome); Sleep disorder; TIA (transient ischemic attack); and Varicose veins. here with:  1. Memory disturbance  2. Cerumen impaction 3. Left hand pain  The patient's memory score has remained relatively the same. She will continue on Namenda. We will continue to monitor her memory. The patient's ear canals are impacted with cerumen. Advised that she should follow with her primary care to have this removed. The patient also complains of left hand pain. I offered to do an x-ray of the hand however the daughter feels that it may be arthritis. They will follow up with primary care if it does not improve. Patient will follow-up with our office in 6 months or sooner if needed.       Butch Penny, MSN, NP-C 03/23/2017, 3:00 PM [facility] [address], [address] [place], [place] [address] [phone]

## 2017-03-23 NOTE — Progress Notes (Signed)
I have read the note, and I agree with the clinical assessment and plan.  Heather Floyd,Heather Floyd   

## 2017-03-23 NOTE — Patient Instructions (Addendum)
Your Plan:  Continue Namenda Continue Trazodone and Zoloft See PCP or ENT for wax removal in ears If your symptoms worsen or you develop new symptoms please let us know.   Thank you for coming to see [place] at [facility]. I hope we have been able to provide you high quality care today.  You may receive a patient satisfaction survey over the next few weeks. We would appreciate your feedback and comments so that we may continue to improve ourselves and the health of our patients.

## 2017-04-06 ENCOUNTER — Encounter (HOSPITAL_COMMUNITY): Payer: Self-pay

## 2017-04-06 ENCOUNTER — Emergency Department (HOSPITAL_COMMUNITY)
Admission: EM | Admit: 2017-04-06 | Discharge: 2017-04-07 | Disposition: A | Discharge status: Home / Self Care | Payer: Medicare Other | Source: Home / Self Care

## 2017-04-06 DIAGNOSIS — A09 Infectious gastroenteritis and colitis, unspecified: Secondary | ICD-10-CM | POA: Diagnosis not present

## 2017-04-06 DIAGNOSIS — R1110 Vomiting, unspecified: Secondary | ICD-10-CM | POA: Insufficient documentation

## 2017-04-06 DIAGNOSIS — Z5321 Procedure and treatment not carried out due to patient leaving prior to being seen by health care provider: Secondary | ICD-10-CM | POA: Insufficient documentation

## 2017-04-06 DIAGNOSIS — R109 Unspecified abdominal pain: Secondary | ICD-10-CM | POA: Diagnosis not present

## 2017-04-06 LAB — COMPREHENSIVE METABOLIC PANEL
ALK PHOS: 57 U/L (ref 38–126)
ALT: 20 U/L (ref 14–54)
ANION GAP: 12 (ref 5–15)
AST: 27 U/L (ref 15–41)
Albumin: 4.1 g/dL (ref 3.5–5.0)
BILIRUBIN TOTAL: 0.8 mg/dL (ref 0.3–1.2)
BUN: 19 mg/dL (ref 6–20)
CALCIUM: 9.9 mg/dL (ref 8.9–10.3)
CO2: 30 mmol/L (ref 22–32)
Chloride: 98 mmol/L — ABNORMAL LOW (ref 101–111)
Creatinine, Ser: 0.96 mg/dL (ref 0.44–1.00)
GFR, EST NON AFRICAN AMERICAN: 54 mL/min — AB (ref >60)
Glucose, Bld: 126 mg/dL — ABNORMAL HIGH (ref 65–99)
Potassium: 3.4 mmol/L — ABNORMAL LOW (ref 3.5–5.1)
Sodium: 140 mmol/L (ref 135–145)
TOTAL PROTEIN: 7.4 g/dL (ref 6.5–8.1)

## 2017-04-06 LAB — URINALYSIS, ROUTINE W REFLEX MICROSCOPIC
Bilirubin Urine: NEGATIVE
GLUCOSE, UA: NEGATIVE mg/dL
Hgb urine dipstick: NEGATIVE
KETONES UR: NEGATIVE mg/dL
NITRITE: NEGATIVE
PH: 5 (ref 5.0–8.0)
Protein, ur: 30 mg/dL — AB
Specific Gravity, Urine: 1.027 (ref 1.005–1.030)

## 2017-04-06 LAB — CBC
HCT: 44.4 % (ref 36.0–46.0)
HEMOGLOBIN: 14.9 g/dL (ref 12.0–15.0)
MCH: 29.2 pg (ref 26.0–34.0)
MCHC: 33.6 g/dL (ref 30.0–36.0)
MCV: 86.9 fL (ref 78.0–100.0)
Platelets: 304 10*3/uL (ref 150–400)
RBC: 5.11 MIL/uL (ref 3.87–5.11)
RDW: 13.5 % (ref 11.5–15.5)
WBC: 12.8 10*3/uL — AB (ref 4.0–10.5)

## 2017-04-06 LAB — LIPASE, BLOOD: Lipase: 41 U/L (ref 11–51)

## 2017-04-06 NOTE — ED Notes (Signed)
Family stated they couldn't wait any longer and would try and go to her Dr. In the morning
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
## 2017-04-07 ENCOUNTER — Inpatient Hospital Stay (HOSPITAL_COMMUNITY)
Admission: EM | Admit: 2017-04-07 | Discharge: 2017-04-12 | DRG: 392 | Disposition: A | Discharge status: Home Health | Payer: Medicare Other | Attending: Internal Medicine | Admitting: Internal Medicine

## 2017-04-07 ENCOUNTER — Encounter (HOSPITAL_COMMUNITY): Payer: Self-pay | Admitting: *Deleted

## 2017-04-07 ENCOUNTER — Emergency Department (HOSPITAL_COMMUNITY): Payer: Medicare Other

## 2017-04-07 ENCOUNTER — Observation Stay (HOSPITAL_COMMUNITY): Payer: Medicare Other

## 2017-04-07 DIAGNOSIS — E860 Dehydration: Secondary | ICD-10-CM | POA: Diagnosis not present

## 2017-04-07 DIAGNOSIS — R1084 Generalized abdominal pain: Secondary | ICD-10-CM | POA: Diagnosis not present

## 2017-04-07 DIAGNOSIS — F0390 Unspecified dementia without behavioral disturbance: Secondary | ICD-10-CM | POA: Diagnosis present

## 2017-04-07 DIAGNOSIS — F0391 Unspecified dementia with behavioral disturbance: Secondary | ICD-10-CM | POA: Diagnosis not present

## 2017-04-07 DIAGNOSIS — K529 Noninfective gastroenteritis and colitis, unspecified: Secondary | ICD-10-CM | POA: Diagnosis present

## 2017-04-07 DIAGNOSIS — I10 Essential (primary) hypertension: Secondary | ICD-10-CM | POA: Diagnosis not present

## 2017-04-07 DIAGNOSIS — G2581 Restless legs syndrome: Secondary | ICD-10-CM | POA: Diagnosis present

## 2017-04-07 DIAGNOSIS — K5900 Constipation, unspecified: Secondary | ICD-10-CM | POA: Diagnosis present

## 2017-04-07 DIAGNOSIS — Z87891 Personal history of nicotine dependence: Secondary | ICD-10-CM

## 2017-04-07 DIAGNOSIS — R109 Unspecified abdominal pain: Secondary | ICD-10-CM

## 2017-04-07 DIAGNOSIS — D72829 Elevated white blood cell count, unspecified: Secondary | ICD-10-CM | POA: Diagnosis present

## 2017-04-07 DIAGNOSIS — R1111 Vomiting without nausea: Secondary | ICD-10-CM

## 2017-04-07 DIAGNOSIS — R8290 Unspecified abnormal findings in urine: Secondary | ICD-10-CM | POA: Diagnosis present

## 2017-04-07 DIAGNOSIS — E785 Hyperlipidemia, unspecified: Secondary | ICD-10-CM | POA: Diagnosis present

## 2017-04-07 DIAGNOSIS — Z8673 Personal history of transient ischemic attack (TIA), and cerebral infarction without residual deficits: Secondary | ICD-10-CM

## 2017-04-07 DIAGNOSIS — E876 Hypokalemia: Secondary | ICD-10-CM | POA: Diagnosis present

## 2017-04-07 DIAGNOSIS — M810 Age-related osteoporosis without current pathological fracture: Secondary | ICD-10-CM | POA: Diagnosis present

## 2017-04-07 DIAGNOSIS — A09 Infectious gastroenteritis and colitis, unspecified: Principal | ICD-10-CM | POA: Diagnosis present

## 2017-04-07 LAB — COMPREHENSIVE METABOLIC PANEL
ALBUMIN: 4 g/dL (ref 3.5–5.0)
ALT: 19 U/L (ref 14–54)
AST: 28 U/L (ref 15–41)
Alkaline Phosphatase: 59 U/L (ref 38–126)
Anion gap: 15 (ref 5–15)
BILIRUBIN TOTAL: 0.9 mg/dL (ref 0.3–1.2)
BUN: 21 mg/dL — AB (ref 6–20)
CO2: 27 mmol/L (ref 22–32)
Calcium: 9.8 mg/dL (ref 8.9–10.3)
Chloride: 97 mmol/L — ABNORMAL LOW (ref 101–111)
Creatinine, Ser: 0.96 mg/dL (ref 0.44–1.00)
GFR calc Af Amer: >60 mL/min (ref >60)
GFR calc non Af Amer: 54 mL/min — ABNORMAL LOW (ref >60)
GLUCOSE: 128 mg/dL — AB (ref 65–99)
POTASSIUM: 3.3 mmol/L — AB (ref 3.5–5.1)
Sodium: 139 mmol/L (ref 135–145)
TOTAL PROTEIN: 7.3 g/dL (ref 6.5–8.1)

## 2017-04-07 LAB — CBC WITH DIFFERENTIAL/PLATELET
BASOS ABS: 0 10*3/uL (ref 0.0–0.1)
BASOS PCT: 0 %
Eosinophils Absolute: 0.1 10*3/uL (ref 0.0–0.7)
Eosinophils Relative: 1 %
HCT: 43.5 % (ref 36.0–46.0)
HEMOGLOBIN: 15.1 g/dL — AB (ref 12.0–15.0)
Lymphocytes Relative: 9 %
Lymphs Abs: 1 10*3/uL (ref 0.7–4.0)
MCH: 30 pg (ref 26.0–34.0)
MCHC: 34.7 g/dL (ref 30.0–36.0)
MCV: 86.3 fL (ref 78.0–100.0)
MONO ABS: 1.3 10*3/uL — AB (ref 0.1–1.0)
Monocytes Relative: 12 %
NEUTROS ABS: 8.4 10*3/uL — AB (ref 1.7–7.7)
NEUTROS PCT: 78 %
Platelets: 288 10*3/uL (ref 150–400)
RBC: 5.04 MIL/uL (ref 3.87–5.11)
RDW: 13.4 % (ref 11.5–15.5)
WBC: 10.7 10*3/uL — AB (ref 4.0–10.5)

## 2017-04-07 LAB — URINALYSIS, ROUTINE W REFLEX MICROSCOPIC
Bilirubin Urine: NEGATIVE
Glucose, UA: NEGATIVE mg/dL
Hgb urine dipstick: NEGATIVE
KETONES UR: NEGATIVE mg/dL
Nitrite: NEGATIVE
Protein, ur: NEGATIVE mg/dL
SPECIFIC GRAVITY, URINE: 1.025 (ref 1.005–1.030)
SQUAMOUS EPITHELIAL / LPF: NONE SEEN
pH: 5 (ref 5.0–8.0)

## 2017-04-07 LAB — I-STAT TROPONIN, ED: TROPONIN I, POC: 0 ng/mL (ref 0.00–0.08)

## 2017-04-07 LAB — LIPASE, BLOOD: Lipase: 36 U/L (ref 11–51)

## 2017-04-07 MED ORDER — ENOXAPARIN SODIUM 40 MG/0.4ML ~~LOC~~ SOLN
40.0000 mg | Freq: Every day | SUBCUTANEOUS | Status: DC
  Administered 2017-04-07 – 2017-04-11 (×5): 40 mg via SUBCUTANEOUS
  Filled 2017-04-07 (×5): qty 0.4

## 2017-04-07 MED ORDER — POTASSIUM CHLORIDE 10 MEQ/100ML IV SOLN
10.0000 meq | INTRAVENOUS | Status: AC
  Administered 2017-04-07 – 2017-04-08 (×2): 10 meq via INTRAVENOUS
  Filled 2017-04-07 (×2): qty 100

## 2017-04-07 MED ORDER — ONDANSETRON HCL 4 MG/2ML IJ SOLN
4.0000 mg | Freq: Four times a day (QID) | INTRAMUSCULAR | Status: DC | PRN
  Administered 2017-04-10: 4 mg via INTRAVENOUS
  Filled 2017-04-07: qty 2

## 2017-04-07 MED ORDER — FENTANYL CITRATE (PF) 100 MCG/2ML IJ SOLN
25.0000 ug | Freq: Once | INTRAMUSCULAR | Status: AC
  Administered 2017-04-07: 25 ug via INTRAVENOUS
  Filled 2017-04-07: qty 2

## 2017-04-07 MED ORDER — ONDANSETRON HCL 4 MG PO TABS
4.0000 mg | ORAL_TABLET | Freq: Four times a day (QID) | ORAL | Status: DC | PRN
Start: 2017-04-07 — End: 2017-04-12

## 2017-04-07 MED ORDER — HALOPERIDOL LACTATE 5 MG/ML IJ SOLN
2.0000 mg | Freq: Four times a day (QID) | INTRAMUSCULAR | Status: DC | PRN
  Administered 2017-04-07 – 2017-04-08 (×2): 2 mg via INTRAVENOUS
  Filled 2017-04-07 (×2): qty 1

## 2017-04-07 MED ORDER — ACETAMINOPHEN 650 MG RE SUPP: 650.0000 mg | Freq: Four times a day (QID) | RECTAL | Status: DC | PRN

## 2017-04-07 MED ORDER — ACETAMINOPHEN 325 MG PO TABS
650.0000 mg | ORAL_TABLET | Freq: Four times a day (QID) | ORAL | Status: DC | PRN
  Administered 2017-04-08 – 2017-04-09 (×3): 650 mg via ORAL
  Filled 2017-04-07 (×7): qty 2

## 2017-04-07 MED ORDER — ONDANSETRON HCL 4 MG/2ML IJ SOLN
4.0000 mg | Freq: Once | INTRAMUSCULAR | Status: AC
  Administered 2017-04-07: 4 mg via INTRAVENOUS
  Filled 2017-04-07: qty 2

## 2017-04-07 MED ORDER — ALBUTEROL SULFATE (2.5 MG/3ML) 0.083% IN NEBU
2.5000 mg | INHALATION_SOLUTION | RESPIRATORY_TRACT | Status: DC | PRN
Start: 2017-04-07 — End: 2017-04-12

## 2017-04-07 MED ORDER — HYDRALAZINE HCL 20 MG/ML IJ SOLN: 5.0000 mg | INTRAMUSCULAR | Status: DC | PRN

## 2017-04-07 MED ORDER — IOPAMIDOL (ISOVUE-300) INJECTION 61%
INTRAVENOUS | Status: AC
  Administered 2017-04-07: 100 mL
  Filled 2017-04-07: qty 100

## 2017-04-07 MED ORDER — POTASSIUM CHLORIDE CRYS ER 20 MEQ PO TBCR: 40.0000 meq | EXTENDED_RELEASE_TABLET | Freq: Once | ORAL | Status: DC

## 2017-04-07 MED ORDER — SODIUM CHLORIDE 0.9 % IV BOLUS (SEPSIS)
1000.0000 mL | Freq: Once | INTRAVENOUS | Status: AC
  Administered 2017-04-07: 1000 mL via INTRAVENOUS

## 2017-04-07 MED ORDER — DEXTROSE 5 % IV SOLN
2.0000 g | Freq: Once | INTRAVENOUS | Status: AC
  Administered 2017-04-07: 2 g via INTRAVENOUS
  Filled 2017-04-07: qty 2

## 2017-04-07 MED ORDER — FENTANYL CITRATE (PF) 100 MCG/2ML IJ SOLN
25.0000 ug | INTRAMUSCULAR | Status: DC | PRN
  Administered 2017-04-08 (×2): 25 ug via INTRAVENOUS
  Filled 2017-04-07 (×2): qty 2

## 2017-04-07 MED ORDER — SODIUM CHLORIDE 0.9 % IV SOLN
INTRAVENOUS | Status: DC
  Administered 2017-04-07 – 2017-04-11 (×3): via INTRAVENOUS

## 2017-04-07 NOTE — ED Notes (Signed)
Patient's family member is aware a urine sample is needed and will notify staff when patient is able to use the restroom.

## 2017-04-07 NOTE — H&P (Addendum)
History and Physical    Heather Floyd:[id number] DOB: 06-30-1932 DOA: 04/07/2017  Referring MD/NP/PA: Dr. Alvira Monday PCP: Daisy Floro, MD  Patient coming from:  home  Chief Complaint:  Abdominal pain  HPI: Heather Floyd is a 83 y.o. female with medical history significant of dementia, HTN, HLD, and RLS; who presents with complaints of abdominal pain over the last 4 days. History is obtained from the patient's daughter and she has significant dementia. She seems to have generalized abdominal pain, but holds her epigastric region. They had tried giving her some crackers, but patient was unable to tolerate this and has vomited bile appearing emesis multiple times. Associated symptoms include nausea, belching, abdominal distention, constipation, and very poor by mouth appetite. Her last bowel movement may have been 3 days ago. She is on Dulcolax at baseline as her medications cause her to be constipated. Family notes that the patient has a sitter easily during the day, but has significant sundowning at night and likely will need a sitter. She also normally has all medications crushed in applesauce.  ED Course: Upon admission into the emergency department  patient was noted to be afebrile, pulse 94-103, respirations 19-20, blood pressure 158/101-169/105, and O2 saturation maintained on room air. Labs revealed WBC 10.7, hemoglobin 15.1, potassium 3.3, BUN 21, creatinine 0.96. CT scan of the abdomen and pelvis reveals signs of enteritis with possible small bowel obstruction as discussed with the radiologist.Patient was given 1 L normal saline IV fluids, Zofran, fentanyl,  40 mEq of potassium chloride, and Rocephin. TRH called to admit.   Review of Systems  Unable to perform ROS: Dementia  Constitutional: Positive for malaise/fatigue. Negative for fever.  HENT: Positive for hearing loss.   Respiratory: Negative for cough.   Cardiovascular: Negative for chest pain.  Gastrointestinal:  Positive for abdominal pain, constipation, nausea and vomiting.  Musculoskeletal: Negative for falls.  Neurological: Negative for loss of consciousness.  Psychiatric/Behavioral: Positive for memory loss.    Past Medical History:  Diagnosis Date  . Abnormality of gait 09/08/2014  . Carotid artery occlusion   . Concussion with loss of consciousness 09/08/2014  . DJD (degenerative joint disease) of cervical spine   . Hyperlipidemia   . Hypertension   . Memory disturbance 03/29/2013  . Osteoporosis   . Personal history of colonic polyps   . RLS (restless legs syndrome)   . Sleep disorder   . TIA (transient ischemic attack)   . Varicose veins     Past Surgical History:  Procedure Laterality Date  . ANTERIOR AND POSTERIOR VAGINAL REPAIR W/ SACROSPINOUS LIGAMENT SUSPENSION    . BREAST REDUCTION SURGERY    . CESAREAN SECTION    . ENDOVENOUS ABLATION SAPHENOUS VEIN W/ LASER    . HEMORRHOID SURGERY       reports that she quit smoking about 36 years ago. She has never used smokeless tobacco. She reports that she does not drink alcohol or use drugs.  Allergies  Allergen Reactions  . Aspirin Other (See Comments)    Makes blood too thin  . Codeine Nausea Only    Family History  Problem Relation Age of Onset  . Mental illness Mother   . Alzheimer's disease Mother   . Cancer Father   . Bone cancer Father   . Dementia Sister     Prior to Admission medications   Medication Sig Start Date End Date Taking? Authorizing Provider  amLODipine (NORVASC) 5 MG tablet Take 1 tablet (5 mg total)  by mouth daily. 09/16/16  Yes Butch Penny, NP  bisacodyl (DULCOLAX) 5 MG EC tablet Take 5 mg by mouth 2 (two) times a week.   Yes [provider]  Cholecalciferol (VITAMIN D) 2000 units CAPS Take 2,000 Units by mouth daily.   Yes [provider]  hydrOXYzine (ATARAX/VISTARIL) 25 MG tablet Take 1 tablet (25 mg total) by mouth 3 (three) times daily as needed. Patient taking  differently: Take 25 mg by mouth 3 (three) times daily as needed for anxiety.  09/16/16  Yes Butch Penny, NP  lisinopril (PRINIVIL,ZESTRIL) 40 MG tablet Take 40 mg by mouth daily.   Yes [provider]  memantine (NAMENDA XR) 28 MG CP24 24 hr capsule TAKE ONE CAPSULE BY MOUTH ONCE DAILY 02/02/17  Yes [name], MD  Multiple Vitamin (MULTIVITAMIN) tablet Take 1 tablet by mouth daily.   Yes [provider]  potassium chloride SA (K-DUR,KLOR-CON) 20 MEQ tablet Take 20 mEq by mouth 2 (two) times daily.   Yes [provider]  risperiDONE (RISPERDAL) 0.5 MG tablet TAKE ONE TABLET BY MOUTH AT BEDTIME Patient taking differently: TAKE 5MG  BY MOUTH TWICE DAILY 02/02/17  Yes [name], MD  rosuvastatin (CRESTOR) 20 MG tablet Take 20 mg by mouth daily.   Yes [provider]  sertraline (ZOLOFT) 100 MG tablet TAKE ONE TABLET BY MOUTH ONCE DAILY 09/03/16  Yes [name], MD  traZODone (DESYREL) 50 MG tablet Take 1 tablet (50 mg total) by mouth at bedtime. 01/14/16  Yes [name], MD  vitamin B-12 (CYANOCOBALAMIN) 100 MCG tablet Take 100 mcg by mouth daily.   Yes [provider]    Physical Exam:  Constitutional: Elderly female who appears to be in some discomfort Vitals:   04/07/17 1653 04/07/17 1723 04/07/17 1940 04/07/17 1953  BP:  (!) 169/105  (!) 158/101  Pulse:  (!) 103  94  Resp:  20  19  SpO2:  96% 94% 98%  Weight: 56.7 kg (125 lb)     Height: 5\' 2"  (1.575 m)      Eyes: PERRL, lids and conjunctivae normal ENMT: Mucous membranes are dry Posterior pharynx clear of any exudate or lesions.  Neck: normal, supple, no masses, no thyromegaly Respiratory: clear to auscultation bilaterally, no wheezing, no crackles. Normal respiratory effort. No accessory muscle use.  Cardiovascular: Tachycardic, no murmurs / rubs / gallops. No extremity edema. 2+ pedal pulses. No carotid bruits.  Abdomen: Mild abdominal distention with  generalized tenderness to palpation, no masses palpated. No hepatosplenomegaly. Bowel sounds positive.  Musculoskeletal: no clubbing / cyanosis. No joint deformity upper and lower extremities. Good ROM, no contractures. Normal muscle tone.  Skin: no rashes, lesions, ulcers. No induration Neurologic: CN 2-12 grossly intact. Sensation intact, DTR normal. Strength 5/5 in all 4.  Psychiatric: Patient has dementia.    Labs on Admission: I have personally reviewed following labs and imaging studies  CBC:  Recent Labs Lab 04/06/17 2027 04/07/17 1726  WBC 12.8* 10.7*  NEUTROABS  --  8.4*  HGB 14.9 15.1*  HCT 44.4 43.5  MCV 86.9 86.3  PLT 304 288   Basic Metabolic Panel:  Recent Labs Lab 04/06/17 2027 04/07/17 1726  NA 140 139  K 3.4* 3.3*  CL 98* 97*  CO2 30 27  GLUCOSE 126* 128*  BUN 19 21*  CREATININE 0.96 0.96  CALCIUM 9.9 9.8   GFR: Estimated Creatinine Clearance: 35.7 mL/min (by C-G formula based on SCr of 0.96 mg/dL).  Liver Function Tests:  Recent Labs Lab 04/06/17 2027 04/07/17 1726  AST 27 28  ALT 20 19  ALKPHOS 57 59  BILITOT 0.8 0.9  PROT 7.4 7.3  ALBUMIN 4.1 4.0    Recent Labs Lab 04/06/17 2027 04/07/17 1726  LIPASE 41 36   No results for input(s): AMMONIA in the last 168 hours. Coagulation Profile: No results for input(s): INR, PROTIME in the last 168 hours. Cardiac Enzymes: No results for input(s): CKTOTAL, CKMB, CKMBINDEX, TROPONINI in the last 168 hours. BNP (last 3 results) No results for input(s): PROBNP in the last 8760 hours. HbA1C: No results for input(s): HGBA1C in the last 72 hours. CBG: No results for input(s): GLUCAP in the last 168 hours. Lipid Profile: No results for input(s): CHOL, HDL, LDLCALC, TRIG, CHOLHDL, LDLDIRECT in the last 72 hours. Thyroid Function Tests: No results for input(s): TSH, T4TOTAL, FREET4, T3FREE, THYROIDAB in the last 72 hours. Anemia Panel: No results for input(s): VITAMINB12, FOLATE, FERRITIN,  TIBC, IRON, RETICCTPCT in the last 72 hours. Urine analysis:    Component Value Date/Time   COLORURINE YELLOW 04/07/2017 1800   APPEARANCEUR TURBID (A) 04/07/2017 1800   LABSPEC 1.025 04/07/2017 1800   PHURINE 5.0 04/07/2017 1800   GLUCOSEU NEGATIVE 04/07/2017 1800   HGBUR NEGATIVE 04/07/2017 1800   BILIRUBINUR NEGATIVE 04/07/2017 1800   KETONESUR NEGATIVE 04/07/2017 1800   PROTEINUR NEGATIVE 04/07/2017 1800   UROBILINOGEN 1.0 09/30/2014 1211   NITRITE NEGATIVE 04/07/2017 1800   LEUKOCYTESUR SMALL (A) 04/07/2017 1800   Sepsis Labs: No results found for this or any previous visit (from the past 240 hour(s)).   Radiological Exams on Admission: Ct Abdomen Pelvis W Contrast  Result Date: 04/07/2017 CLINICAL DATA:  83 year old presenting with a four-day history of bilious vomiting and abdominal distention. EXAM: CT ABDOMEN AND PELVIS WITH CONTRAST TECHNIQUE: Multidetector CT imaging of the abdomen and pelvis was performed using the standard protocol following bolus administration of intravenous contrast. CONTRAST:  ISOVUE-300 IOPAMIDOL INJECTION 61% IV. COMPARISON:  CTA abdomen 06/22/2007. CT abdomen and pelvis 06/21/2007. FINDINGS: Lower chest: Mild dependent atelectasis posteriorly in the lower lobes. Visualized lung bases otherwise clear. Heart size upper normal. Hepatobiliary: Mild diffuse hepatic steatosis without significant focal hepatic parenchymal abnormality. Calcified granuloma in the right lobe of the liver. Solitary 9 mm calcified gallstone in the otherwise normal-appearing gallbladder. No biliary ductal dilation. Pancreas: Mild diffuse pancreatic atrophy. No pancreatic masses. No peripancreatic inflammation. Spleen: Normal in size and appearance. Adrenals/Urinary Tract: Normal appearing adrenal glands. Small exophytic cyst arising from the lateral mid right kidney. No significant focal parenchymal abnormality involving either kidney. No hydronephrosis. No visible urinary tract  calculi. Urinary bladder decompressed and unremarkable. Stomach/Bowel: Stomach normal in appearance for the degree of distention. Multiple dilated loops of jejunum and proximal ileum with mucosal enhancement. The mid and distal ileum are decompressed, but also demonstrate mucosal enhancement. There are scattered small bowel diverticula. Entire colon decompressed with scattered areas of mucosal enhancement. No visible colonic diverticula. Lipoma involving the ileocecal valve. Appendix not visualized, but no pericecal inflammation. Vascular/Lymphatic: Severe aortoiliofemoral atherosclerosis without evidence of aneurysm. Normal-appearing portal venous and systemic venous systems. Borderline portacaval and porta hepatis lymph nodes, unchanged since the 2009 CT. No new or significant lymphadenopathy in the abdomen or pelvis. Reproductive: Uterus surgically absent.  No adnexal masses. Other: Small amount of ascites in the right side of the low pelvis. Musculoskeletal: Osseous demineralization. Degenerative disc disease and disc extrusion at L5-S1 with a desiccated disc fragment behind  the S1 vertebral body. Mild compression fracture involving the upper endplate of L2 on the order of 30% or so, new since 2009 but not felt to be acute. IMPRESSION: 1. Diffuse enteritis involving essentially the entire small bowel, with dilation of the jejunum and proximal ileum. The mid and distal ileum and the entire colon are decompressed, but I do not identify an obstructing small bowel mass or a definite adhesion to confirm a small bowel obstruction. 2. Small amount of dependent ascites in the right side of the pelvis. 3. Cholelithiasis without evidence of acute cholecystitis. 4. Diffuse hepatic steatosis without focal hepatic parenchymal abnormality. 5. Mild diffuse pancreatic atrophy. 6.  Aortic Atherosclerosis (ICD10-170.0) Electronically Signed   By: Hulan Saashomas  Lawrence M.D.   On: 04/07/2017 18:49    EKG: Independently reviewed.  Sinus rhythm similar to previous tracing  Assessment/Plan Abdominal pain 2/2 enteritis: Acute. Patient reports having presents with complaints of abdominal distention with pain. CT scan showing signs of enteritis and possible early small bowel obstruction. - Admit to a MedSurg bed - Follow up blood culture - NPO to allow for bowel rest - Monitor ins and outs - fentanyl IV prn pain - IV fluids normal saline at 16800ml/hr - Check abd x-ray in a.m. - May warrant consult to Gen. surgery  Nausea and vomiting: Acute. - Continue antiemetics as needed  Abnormal urinalysis: Acute. Urinalysis showing 6-30 WBCs with small leukocytes. Patient was given 1 dose of Rocephin in the emergency department - Follow-up urine culture - Determine if continuation of antibiotics is warranted   Dehydration: Acute. Seen by the elevated BUN to creatinine ratio. - IV fluids as seen above   Leukocytosis: WBC elevated at 10.6 on admission. Suspect secondary to the above. - repeat CBC in a.m.    Hypokalemia: Acute. Initial potassium 3.3 on admission. Patient was ordered 40 mEq of potassium chloride while in the ED, but unable to swallow large pills so discontinued. - Give 20 mEq potassium chloride IV 1 dose now - Continue to monitor and replace as needed  Dementia with behavioral disturbance  - IV Haldol 2 mg prn agitation  Essential hypertension - Hydralazine 5 mg IV q 4hr prn >sBP/dBP  RLS   DVT prophylaxis: lovenox Code Status: Full  Family Communication: Discussed monitor with the patient family presents at bedside Disposition Plan: TBD  Consults called:  None Admission status: Observation  Clydie Braunondell A Smith MD [facility] Pager [phone]   If 7PM-7AM, please contact night-coverage [email or web address] Password [email or web address]  04/07/2017, 9:22 PM

## 2017-04-07 NOTE — ED Notes (Signed)
Bed: WA08 Expected date:  Expected time:  Means of arrival:  Comments: EMS abdominal pain 
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
## 2017-04-07 NOTE — ED Triage Notes (Signed)
Per EMS: Pt is coming from Dr's. Pt has been vomiting and has not been taking her medicines for 4 days. Pt has been reportedly vomiting bile. No guarding noted or tenderness. Unknown last BM.  Vitals WNL. PT refused CBG from EMS PT has abdominal swelling  RR 12 HR 106 BP 106/98 SPO2 91% Temp 98.1

## 2017-04-07 NOTE — ED Notes (Signed)
Patient given a rice cripsy treat to eat after refusing crackers.

## 2017-04-07 NOTE — ED Notes (Signed)
Patient transported to CT 

## 2017-04-08 ENCOUNTER — Observation Stay (HOSPITAL_COMMUNITY): Payer: Medicare Other

## 2017-04-08 DIAGNOSIS — D72829 Elevated white blood cell count, unspecified: Secondary | ICD-10-CM | POA: Diagnosis not present

## 2017-04-08 DIAGNOSIS — E876 Hypokalemia: Secondary | ICD-10-CM | POA: Diagnosis present

## 2017-04-08 DIAGNOSIS — R1111 Vomiting without nausea: Secondary | ICD-10-CM | POA: Diagnosis not present

## 2017-04-08 DIAGNOSIS — M810 Age-related osteoporosis without current pathological fracture: Secondary | ICD-10-CM | POA: Diagnosis present

## 2017-04-08 DIAGNOSIS — F0390 Unspecified dementia without behavioral disturbance: Secondary | ICD-10-CM | POA: Diagnosis present

## 2017-04-08 DIAGNOSIS — Z8673 Personal history of transient ischemic attack (TIA), and cerebral infarction without residual deficits: Secondary | ICD-10-CM | POA: Diagnosis not present

## 2017-04-08 DIAGNOSIS — A09 Infectious gastroenteritis and colitis, unspecified: Secondary | ICD-10-CM | POA: Diagnosis present

## 2017-04-08 DIAGNOSIS — G2581 Restless legs syndrome: Secondary | ICD-10-CM | POA: Diagnosis present

## 2017-04-08 DIAGNOSIS — F0391 Unspecified dementia with behavioral disturbance: Secondary | ICD-10-CM | POA: Diagnosis present

## 2017-04-08 DIAGNOSIS — I10 Essential (primary) hypertension: Secondary | ICD-10-CM | POA: Diagnosis not present

## 2017-04-08 DIAGNOSIS — K529 Noninfective gastroenteritis and colitis, unspecified: Secondary | ICD-10-CM | POA: Diagnosis not present

## 2017-04-08 DIAGNOSIS — R1084 Generalized abdominal pain: Secondary | ICD-10-CM | POA: Diagnosis not present

## 2017-04-08 DIAGNOSIS — R109 Unspecified abdominal pain: Secondary | ICD-10-CM | POA: Diagnosis present

## 2017-04-08 DIAGNOSIS — R8290 Unspecified abnormal findings in urine: Secondary | ICD-10-CM | POA: Diagnosis present

## 2017-04-08 DIAGNOSIS — K5900 Constipation, unspecified: Secondary | ICD-10-CM | POA: Diagnosis present

## 2017-04-08 DIAGNOSIS — Z87891 Personal history of nicotine dependence: Secondary | ICD-10-CM | POA: Diagnosis not present

## 2017-04-08 DIAGNOSIS — E860 Dehydration: Secondary | ICD-10-CM | POA: Diagnosis present

## 2017-04-08 DIAGNOSIS — E785 Hyperlipidemia, unspecified: Secondary | ICD-10-CM | POA: Diagnosis present

## 2017-04-08 LAB — CBC
HEMATOCRIT: 37.3 % (ref 36.0–46.0)
Hemoglobin: 12.7 g/dL (ref 12.0–15.0)
MCH: 29.6 pg (ref 26.0–34.0)
MCHC: 34 g/dL (ref 30.0–36.0)
MCV: 86.9 fL (ref 78.0–100.0)
PLATELETS: 214 10*3/uL (ref 150–400)
RBC: 4.29 MIL/uL (ref 3.87–5.11)
RDW: 13.7 % (ref 11.5–15.5)
WBC: 9.8 10*3/uL (ref 4.0–10.5)

## 2017-04-08 LAB — BASIC METABOLIC PANEL
ANION GAP: 8 (ref 5–15)
BUN: 14 mg/dL (ref 6–20)
CALCIUM: 8.3 mg/dL — AB (ref 8.9–10.3)
CO2: 23 mmol/L (ref 22–32)
CREATININE: 0.81 mg/dL (ref 0.44–1.00)
Chloride: 107 mmol/L (ref 101–111)
GFR calc Af Amer: >60 mL/min (ref >60)
GLUCOSE: 97 mg/dL (ref 65–99)
Potassium: 3.8 mmol/L (ref 3.5–5.1)
Sodium: 138 mmol/L (ref 135–145)

## 2017-04-08 LAB — ALBUMIN: Albumin: 3.1 g/dL — ABNORMAL LOW (ref 3.5–5.0)

## 2017-04-08 MED ORDER — HYDROXYZINE HCL 25 MG PO TABS
25.0000 mg | ORAL_TABLET | Freq: Three times a day (TID) | ORAL | Status: DC | PRN
  Administered 2017-04-09 – 2017-04-11 (×4): 25 mg via ORAL
  Filled 2017-04-08 (×5): qty 1

## 2017-04-08 MED ORDER — METRONIDAZOLE IN NACL 5-0.79 MG/ML-% IV SOLN
500.0000 mg | Freq: Three times a day (TID) | INTRAVENOUS | Status: DC
  Administered 2017-04-08 – 2017-04-12 (×12): 500 mg via INTRAVENOUS
  Filled 2017-04-08 (×14): qty 100

## 2017-04-08 MED ORDER — AMLODIPINE BESYLATE 5 MG PO TABS
5.0000 mg | ORAL_TABLET | Freq: Every day | ORAL | Status: DC
  Administered 2017-04-08 – 2017-04-12 (×5): 5 mg via ORAL
  Filled 2017-04-08 (×5): qty 1

## 2017-04-08 MED ORDER — LORAZEPAM 2 MG/ML IJ SOLN
0.2500 mg | Freq: Once | INTRAMUSCULAR | Status: AC | PRN
  Administered 2017-04-08: 0.25 mg via INTRAVENOUS
  Filled 2017-04-08: qty 1

## 2017-04-08 MED ORDER — DEXTROSE 5 % IV SOLN
2.0000 g | INTRAVENOUS | Status: DC
  Administered 2017-04-08 – 2017-04-12 (×5): 2 g via INTRAVENOUS
  Filled 2017-04-08 (×5): qty 2

## 2017-04-08 MED ORDER — RISPERIDONE 0.25 MG PO TABS
0.5000 mg | ORAL_TABLET | Freq: Every day | ORAL | Status: DC
  Administered 2017-04-08 – 2017-04-11 (×4): 0.5 mg via ORAL
  Filled 2017-04-08 (×4): qty 2

## 2017-04-08 MED ORDER — LORAZEPAM 2 MG/ML IJ SOLN
0.2500 mg | Freq: Once | INTRAMUSCULAR | Status: AC
  Administered 2017-04-08: 0.25 mg via INTRAVENOUS
  Filled 2017-04-08: qty 1

## 2017-04-08 NOTE — Progress Notes (Signed)
Date:  April 08 2017 Chart reviewed for concurrent status and case management needs.  Will continue to follow patient progress.  Discharge Planning: following for needs  Expected discharge date: April 11, 2017  Ashira Kirsten, BSN, RN3, CCM   [phone]  

## 2017-04-08 NOTE — Progress Notes (Addendum)
PROGRESS NOTE    Heather Floyd  ZOX:[id number] DOB: Jun 13, 1933 DOA: 04/07/2017 PCP: Daisy Floro, MD   Brief Narrative:  83 y.o. female with medical history significant of dementia, HTN, HLD, and RLS; who presents with complaints of abdominal pain over the last 4 days.  History is obtained from the patient's daughter and she has significant dementia. She seems to have generalized abdominal pain, but holds her epigastric region. They had tried giving her some crackers, but patient was unable to tolerate this and has vomited bile appearing emesis multiple times. Associated symptoms include nausea, belching, abdominal distention, constipation, and very poor by mouth appetite. Her last bowel movement may have been 3 days ago. She is on Dulcolax at baseline as her medications cause her to be constipated. Family notes that the patient has Pistol Kessenich sitter easily during the day, but has significant sundowning at night and likely will need Danaysha Kirn sitter. She also normally has all medications crushed in applesauce.  She was admitted for CT findings c/w enteritis with possible SBO.    Assessment & Plan:   Principal Problem:   Enteritis Active Problems:   Hypertension   Essential hypertension   Abdominal pain   Leukocytosis   Dementia   Dehydration   Abnormal urinalysis   Abdominal pain 2/2 enteritis: Acute. Patient reports having presents with complaints of abdominal distention with pain. CT scan showing signs of enteritis and possible early small bowel obstruction. - Ceftriaxone/flagyl for enteritis  - GI path panel ordered (no C diff) - Follow up blood culture, urine cx - Clear liquid diet - Monitor ins and outs - IV fluids normal saline at 75 cc/hr - Surgery consult for possible SBO (x ray this morning without definitive evidence, mild residual small bowel dilatation)  Nausea and vomiting:  - Continue antiemetics as needed  Abnormal urinalysis: Acute. Urinalysis showing 6-30 WBCs with small  leukocytes. Patient was given 1 dose of Rocephin in the emergency department - Follow-up urine culture - Determine if continuation of antibiotics is warranted   Dehydration: Acute. Seen by the elevated BUN to creatinine ratio. - IV fluids as seen above   Leukocytosis: WBC elevated at 10.6 on admission. Suspect secondary to the above. - repeat CBC in Alphonza Tramell.m.    Hypokalemia:  - Continue to monitor and replace as needed  Dementia with behavioral disturbance  - restart nightly risperdal, hold IV haldol unless needed. - sitter at bedside - delirium precautions  Essential hypertension - Hydralazine 5 mg IV q 4hr prn >sBP/dBP - resume amlodipine, currently holding home lisinopril  RLS   DVT prophylaxis: lovenox Code Status: full  Family Communication: discussed with caregiver at bedside, plan to call daughter Heather Floyd Disposition Plan: pending improvement   Consultants:   General Surgery  Procedures: (Don't include imaging studies which can be auto populated. Include things that cannot be auto populated i.e. Echo, Carotid and venous dopplers, Foley, Bipap, HD, tubes/drains, wound vac, central lines etc)  none  Antimicrobials: (specify start and planned stop date. Auto populated tables are space occupying and do not give end dates)  Ceftriaxone/flagy    Subjective: Denies current symptoms.   Objective: Vitals:   04/07/17 1953 04/07/17 2214 04/08/17 0004 04/08/17 0748  BP: (!) 158/101 (!) 150/72 (!) 161/98 (!) 136/52  Pulse: 94 86 97 (!) 113  Resp: 19 18 18 18   Temp:  98.7 F (37.1 C)  98.7 F (37.1 C)  TempSrc:  Oral  Oral  SpO2: 98% 95% 95% 90%  Weight:  Height:        Intake/Output Summary (Last 24 hours) at 04/08/17 0953 Last data filed at 04/08/17 0915  Gross per 24 hour  Intake            837.5 ml  Output                0 ml  Net            837.5 ml   Filed Weights   04/07/17 1653  Weight: 56.7 kg (125 lb)    Examination:  General exam:  Appears calm and comfortable  Respiratory system: Clear to auscultation. Respiratory effort normal. Cardiovascular system: S1 & S2 heard, RRR. No JVD, murmurs, rubs, gallops or clicks. No pedal edema. Gastrointestinal system: Abdomen is mildly distended, soft and nontender. No organomegaly or masses felt. Normal bowel sounds heard. Central nervous system: Disoriented. No focal neurological deficits. Extremities: Moving all extremities equally Skin: No rashes, lesions or ulcers Psychiatry: Judgement and insight appear impaired. Nonsensical speech.     Data Reviewed: I have personally reviewed following labs and imaging studies  CBC:  Recent Labs Lab 04/06/17 2027 04/07/17 1726 04/08/17 0604  WBC 12.8* 10.7* 9.8  NEUTROABS  --  8.4*  --   HGB 14.9 15.1* 12.7  HCT 44.4 43.5 37.3  MCV 86.9 86.3 86.9  PLT 304 288 214   Basic Metabolic Panel:  Recent Labs Lab 04/06/17 2027 04/07/17 1726 04/08/17 0604  NA 140 139 138  K 3.4* 3.3* 3.8  CL 98* 97* 107  CO2 30 27 23   GLUCOSE 126* 128* 97  BUN 19 21* 14  CREATININE 0.96 0.96 0.81  CALCIUM 9.9 9.8 8.3*   GFR: Estimated Creatinine Clearance: 42.4 mL/min (by C-G formula based on SCr of 0.81 mg/dL). Liver Function Tests:  Recent Labs Lab 04/06/17 2027 04/07/17 1726  AST 27 28  ALT 20 19  ALKPHOS 57 59  BILITOT 0.8 0.9  PROT 7.4 7.3  ALBUMIN 4.1 4.0    Recent Labs Lab 04/06/17 2027 04/07/17 1726  LIPASE 41 36   No results for input(s): AMMONIA in the last 168 hours. Coagulation Profile: No results for input(s): INR, PROTIME in the last 168 hours. Cardiac Enzymes: No results for input(s): CKTOTAL, CKMB, CKMBINDEX, TROPONINI in the last 168 hours. BNP (last 3 results) No results for input(s): PROBNP in the last 8760 hours. HbA1C: No results for input(s): HGBA1C in the last 72 hours. CBG: No results for input(s): GLUCAP in the last 168 hours. Lipid Profile: No results for input(s): CHOL, HDL, LDLCALC, TRIG,  CHOLHDL, LDLDIRECT in the last 72 hours. Thyroid Function Tests: No results for input(s): TSH, T4TOTAL, FREET4, T3FREE, THYROIDAB in the last 72 hours. Anemia Panel: No results for input(s): VITAMINB12, FOLATE, FERRITIN, TIBC, IRON, RETICCTPCT in the last 72 hours. Sepsis Labs: No results for input(s): PROCALCITON, LATICACIDVEN in the last 168 hours.  No results found for this or any previous visit (from the past 240 hour(s)).       Radiology Studies: Dg Abd 1 View  Result Date: 04/08/2017 CLINICAL DATA:  Abdominal pain. EXAM: ABDOMEN - 1 VIEW COMPARISON:  CT abdomen pelvis 04/07/2017. FINDINGS: Mild residual small bowel dilatation. Scattered colonic gas and stool. No unexpected radiopaque calculi. Right hemidiaphragm is elevated. IMPRESSION: Mild residual small bowel dilatation. No definitive evidence of an obstruction. Electronically Signed   By: Leanna Battles M.D.   On: 04/08/2017 08:35   Ct Abdomen Pelvis W Contrast  Result Date: 04/07/2017 CLINICAL  DATA:  83 year old presenting with Heather Floyd four-day history of bilious vomiting and abdominal distention. EXAM: CT ABDOMEN AND PELVIS WITH CONTRAST TECHNIQUE: Multidetector CT imaging of the abdomen and pelvis was performed using the standard protocol following bolus administration of intravenous contrast. CONTRAST:  100mL ISOVUE-300 IOPAMIDOL INJECTION 61% IV. COMPARISON:  CTA abdomen 06/22/2007. CT abdomen and pelvis 06/21/2007. FINDINGS: Lower chest: Mild dependent atelectasis posteriorly in the lower lobes. Visualized lung bases otherwise clear. Heart size upper normal. Hepatobiliary: Mild diffuse hepatic steatosis without significant focal hepatic parenchymal abnormality. Calcified granuloma in the right lobe of the liver. Solitary 9 mm calcified gallstone in the otherwise normal-appearing gallbladder. No biliary ductal dilation. Pancreas: Mild diffuse pancreatic atrophy. No pancreatic masses. No peripancreatic inflammation. Spleen: Normal in  size and appearance. Adrenals/Urinary Tract: Normal appearing adrenal glands. Small exophytic cyst arising from the lateral mid right kidney. No significant focal parenchymal abnormality involving either kidney. No hydronephrosis. No visible urinary tract calculi. Urinary bladder decompressed and unremarkable. Stomach/Bowel: Stomach normal in appearance for the degree of distention. Multiple dilated loops of jejunum and proximal ileum with mucosal enhancement. The mid and distal ileum are decompressed, but also demonstrate mucosal enhancement. There are scattered small bowel diverticula. Entire colon decompressed with scattered areas of mucosal enhancement. No visible colonic diverticula. Lipoma involving the ileocecal valve. Appendix not visualized, but no pericecal inflammation. Vascular/Lymphatic: Severe aortoiliofemoral atherosclerosis without evidence of aneurysm. Normal-appearing portal venous and systemic venous systems. Borderline portacaval and porta hepatis lymph nodes, unchanged since the 2009 CT. No new or significant lymphadenopathy in the abdomen or pelvis. Reproductive: Uterus surgically absent.  No adnexal masses. Other: Small amount of ascites in the right side of the low pelvis. Musculoskeletal: Osseous demineralization. Degenerative disc disease and disc extrusion at L5-S1 with Lakeya Mulka desiccated disc fragment behind the S1 vertebral body. Mild compression fracture involving the upper endplate of L2 on the order of 30% or so, new since 2009 but not felt to be acute. IMPRESSION: 1. Diffuse enteritis involving essentially the entire small bowel, with dilation of the jejunum and proximal ileum. The mid and distal ileum and the entire colon are decompressed, but I do not identify an obstructing small bowel mass or Kylle Lall definite adhesion to confirm Micole Delehanty small bowel obstruction. 2. Small amount of dependent ascites in the right side of the pelvis. 3. Cholelithiasis without evidence of acute cholecystitis. 4.  Diffuse hepatic steatosis without focal hepatic parenchymal abnormality. 5. Mild diffuse pancreatic atrophy. 6.  Aortic Atherosclerosis (ICD10-170.0) Electronically Signed   By: Hulan Saashomas  Lawrence M.D.   On: 04/07/2017 18:49        Scheduled Meds: . enoxaparin (LOVENOX) injection  40 mg Subcutaneous QHS   Continuous Infusions: . sodium chloride 75 mL/hr at 04/07/17 2210  . cefTRIAXone (ROCEPHIN)  IV    . metronidazole       LOS: 0 days    Time spent: over 30 minutes    Lacretia Nicksaldwell Powell, MD [facility] Pager [phone]   If 7PM-7AM, please contact night-coverage [email or web address] Password [email or web address] 04/08/2017, 9:53 AM

## 2017-04-08 NOTE — Consult Note (Signed)
[facility] Surgery Consult Note  Heather Floyd [facility] 11/18/1932  [id number].    Requesting MD: Florene Glen Chief Complaint/Reason for Consult: SBO vs enteritis HPI:  Patient is a 83 y/o female who presented with abdominal pain that started 4 days ago. Has been holding her epigastric area. Patient has had emesis, nausea, belching , distention. Last BM ~3 days ago, was small. Patient normally has a BM every day or every other day. Takes pearls at home to help with BMs, used to take dulcolax but stopped that due to cramping. Patient did not eat anything unusual. Had one family member that also thought she had a stomach bug earlier this week, but family member did not have n/v. PMH significant for dementia, HTN, HLD, RLS. No blood thinning medications. Past abdominal surgeries include cesarean section. Smoked cigarettes many years ago, but does not smoke currently.    ROS: Review of Systems  Constitutional: Negative for chills and fever.  Respiratory: Negative for cough.   Cardiovascular: Negative for chest pain and palpitations.  Gastrointestinal: Positive for abdominal pain, constipation, nausea and vomiting.  Neurological: Negative for loss of consciousness.  Psychiatric/Behavioral: Positive for memory loss (dementia).  All other systems reviewed and are negative.   Family History  Problem Relation Age of Onset  . Mental illness Mother   . Alzheimer's disease Mother   . Cancer Father   . Bone cancer Father   . Dementia Sister     Past Medical History:  Diagnosis Date  . Abnormality of gait 09/08/2014  . Carotid artery occlusion   . Concussion with loss of consciousness 09/08/2014  . DJD (degenerative joint disease) of cervical spine   . Hyperlipidemia   . Hypertension   . Memory disturbance 03/29/2013  . Osteoporosis   . Personal history of colonic polyps   . RLS (restless legs syndrome)   . Sleep disorder   . TIA (transient ischemic attack)   . Varicose veins     Past Surgical  History:  Procedure Laterality Date  . ANTERIOR AND POSTERIOR VAGINAL REPAIR W/ SACROSPINOUS LIGAMENT SUSPENSION    . BREAST REDUCTION SURGERY    . CESAREAN SECTION    . ENDOVENOUS ABLATION SAPHENOUS VEIN W/ LASER    . HEMORRHOID SURGERY      Social History:  reports that she quit smoking about 36 years ago. She has never used smokeless tobacco. She reports that she does not drink alcohol or use drugs.  Allergies:  Allergies  Allergen Reactions  . Aspirin Other (See Comments)    Makes blood too thin  . Codeine Nausea Only    Medications Prior to Admission  Medication Sig Dispense Refill  . amLODipine (NORVASC) 5 MG tablet Take 1 tablet (5 mg total) by mouth daily. 30 tablet 0  . bisacodyl (DULCOLAX) 5 MG EC tablet Take 5 mg by mouth 2 (two) times a week.    . Cholecalciferol (VITAMIN D) 2000 units CAPS Take 2,000 Units by mouth daily.    . hydrOXYzine (ATARAX/VISTARIL) 25 MG tablet Take 1 tablet (25 mg total) by mouth 3 (three) times daily as needed. (Patient taking differently: Take 25 mg by mouth 3 (three) times daily as needed for anxiety. ) 90 tablet 3  . lisinopril (PRINIVIL,ZESTRIL) 40 MG tablet Take 40 mg by mouth daily.    . memantine (NAMENDA XR) 28 MG CP24 24 hr capsule TAKE ONE CAPSULE BY MOUTH ONCE DAILY 30 capsule 6  . Multiple Vitamin (MULTIVITAMIN) tablet Take 1 tablet by mouth daily.    Marland Kitchen  potassium chloride SA (K-DUR,KLOR-CON) 20 MEQ tablet Take 20 mEq by mouth 2 (two) times daily.    . risperiDONE (RISPERDAL) 0.5 MG tablet TAKE ONE TABLET BY MOUTH AT BEDTIME (Patient taking differently: TAKE 5MG BY MOUTH TWICE DAILY) 30 tablet 3  . rosuvastatin (CRESTOR) 20 MG tablet Take 20 mg by mouth daily.    . sertraline (ZOLOFT) 100 MG tablet TAKE ONE TABLET BY MOUTH ONCE DAILY 30 tablet 6  . traZODone (DESYREL) 50 MG tablet Take 1 tablet (50 mg total) by mouth at bedtime. 30 tablet 3  . vitamin B-12 (CYANOCOBALAMIN) 100 MCG tablet Take 100 mcg by mouth daily.      Blood  pressure (!) 136/52, pulse (!) 113, temperature 98.7 F (37.1 C), temperature source Oral, resp. rate 18, height 5' 2"  (1.575 m), weight 56.7 kg (125 lb), SpO2 90 %. Physical Exam: Physical Exam  Constitutional: She appears well-developed and well-nourished. She is cooperative.  Non-toxic appearance. No distress.  HENT:  Head: Normocephalic and atraumatic.  Right Ear: External ear normal.  Left Ear: External ear normal.  Nose: Nose normal.  Mouth/Throat: Oropharynx is clear and moist and mucous membranes are normal.  Eyes: Pupils are equal, round, and reactive to light. Conjunctivae and lids are normal. No scleral icterus.  Neck: Normal range of motion and phonation normal. Neck supple. No thyromegaly present.  Cardiovascular: Normal rate and regular rhythm.   Pulses:      Radial pulses are 2+ on the right side, and 2+ on the left side.       Dorsalis pedis pulses are 2+ on the right side, and 2+ on the left side.  Pulmonary/Chest: Effort normal and breath sounds normal.  Abdominal: Soft. She exhibits distension. She exhibits no mass. Bowel sounds are decreased. There is no hepatosplenomegaly. There is generalized tenderness (mild TTP). There is guarding. There is no rigidity and no rebound. No hernia.  Musculoskeletal:  ROM grossly intact in BL upper and lower extremities  Neurological: She is alert. She is disoriented.  Skin: Skin is warm, dry and intact. No rash noted. She is not diaphoretic. No pallor.  Psychiatric: Her speech is tangential. Cognition and memory are impaired.    Results for orders placed or performed during the hospital encounter of 04/07/17 (from the past 48 hour(s))  CBC with Differential     Status: Abnormal   Collection Time: 04/07/17  5:26 PM  Result Value Ref Range   WBC 10.7 (H) 4.0 - 10.5 K/uL   RBC 5.04 3.87 - 5.11 MIL/uL   Hemoglobin 15.1 (H) 12.0 - 15.0 g/dL   HCT 43.5 36.0 - 46.0 %   MCV 86.3 78.0 - 100.0 fL   MCH 30.0 26.0 - 34.0 pg   MCHC 34.7  30.0 - 36.0 g/dL   RDW 13.4 11.5 - 15.5 %   Platelets 288 150 - 400 K/uL   Neutrophils Relative % 78 %   Neutro Abs 8.4 (H) 1.7 - 7.7 K/uL   Lymphocytes Relative 9 %   Lymphs Abs 1.0 0.7 - 4.0 K/uL   Monocytes Relative 12 %   Monocytes Absolute 1.3 (H) 0.1 - 1.0 K/uL   Eosinophils Relative 1 %   Eosinophils Absolute 0.1 0.0 - 0.7 K/uL   Basophils Relative 0 %   Basophils Absolute 0.0 0.0 - 0.1 K/uL  Comprehensive metabolic panel     Status: Abnormal   Collection Time: 04/07/17  5:26 PM  Result Value Ref Range   Sodium 139 135 -  145 mmol/L   Potassium 3.3 (L) 3.5 - 5.1 mmol/L   Chloride 97 (L) 101 - 111 mmol/L   CO2 27 22 - 32 mmol/L   Glucose, Bld 128 (H) 65 - 99 mg/dL   BUN 21 (H) 6 - 20 mg/dL   Creatinine, Ser 0.96 0.44 - 1.00 mg/dL   Calcium 9.8 8.9 - 10.3 mg/dL   Total Protein 7.3 6.5 - 8.1 g/dL   Albumin 4.0 3.5 - 5.0 g/dL   AST 28 15 - 41 U/L   ALT 19 14 - 54 U/L   Alkaline Phosphatase 59 38 - 126 U/L   Total Bilirubin 0.9 0.3 - 1.2 mg/dL   GFR calc non Af Amer 54 (L) >60 mL/min   GFR calc Af Amer >60 >60 mL/min    Comment: (NOTE) The eGFR has been calculated using the CKD EPI equation. This calculation has not been validated in all clinical situations. eGFR's persistently <60 mL/min signify possible Chronic Kidney Disease.    Anion gap 15 5 - 15  Lipase, blood     Status: None   Collection Time: 04/07/17  5:26 PM  Result Value Ref Range   Lipase 36 11 - 51 U/L  I-Stat Troponin, ED (not at [facility])     Status: None   Collection Time: 04/07/17  5:32 PM  Result Value Ref Range   Troponin i, poc 0.00 0.00 - 0.08 ng/mL   Comment 3            Comment: Due to the release kinetics of cTnI, a negative result within the first hours of the onset of symptoms does not rule out myocardial infarction with certainty. If myocardial infarction is still suspected, repeat the test at appropriate intervals.   Urinalysis, Routine w reflex microscopic     Status: Abnormal    Collection Time: 04/07/17  6:00 PM  Result Value Ref Range   Color, Urine YELLOW YELLOW   APPearance TURBID (A) CLEAR   Specific Gravity, Urine 1.025 1.005 - 1.030   pH 5.0 5.0 - 8.0   Glucose, UA NEGATIVE NEGATIVE mg/dL   Hgb urine dipstick NEGATIVE NEGATIVE   Bilirubin Urine NEGATIVE NEGATIVE   Ketones, ur NEGATIVE NEGATIVE mg/dL   Protein, ur NEGATIVE NEGATIVE mg/dL   Nitrite NEGATIVE NEGATIVE   Leukocytes, UA SMALL (A) NEGATIVE   RBC / HPF 0-5 0 - 5 RBC/hpf   WBC, UA 6-30 0 - 5 WBC/hpf   Bacteria, UA MANY (A) NONE SEEN   Squamous Epithelial / LPF NONE SEEN NONE SEEN   Mucus PRESENT   Blood culture (routine x 2)     Status: None (Preliminary result)   Collection Time: 04/07/17 10:07 PM  Result Value Ref Range   Specimen Description BLOOD LEFT ANTECUBITAL    Special Requests      BOTTLES DRAWN AEROBIC AND ANAEROBIC Blood Culture adequate volume   Culture      NO GROWTH < 12 HOURS Performed at [facility], [address] [place], [place] [address]    Report Status PENDING   Blood culture (routine x 2)     Status: None (Preliminary result)   Collection Time: 04/07/17 10:09 PM  Result Value Ref Range   Specimen Description BLOOD RIGHT ANTECUBITAL    Special Requests      BOTTLES DRAWN AEROBIC AND ANAEROBIC Blood Culture adequate volume   Culture      NO GROWTH < 12 HOURS Performed at [facility]  [address], [place], [place] [address]    Report Status PENDING   CBC     Status: None   Collection Time: 04/08/17  6:04 AM  Result Value Ref Range   WBC 9.8 4.0 - 10.5 K/uL   RBC 4.29 3.87 - 5.11 MIL/uL   Hemoglobin 12.7 12.0 - 15.0 g/dL   HCT 37.3 36.0 - 46.0 %   MCV 86.9 78.0 - 100.0 fL   MCH 29.6 26.0 - 34.0 pg   MCHC 34.0 30.0 - 36.0 g/dL   RDW 13.7 11.5 - 15.5 %   Platelets 214 150 - 400 K/uL  Basic metabolic panel     Status: Abnormal   Collection Time: 04/08/17  6:04 AM  Result Value Ref Range   Sodium 138 135 - 145 mmol/L   Potassium 3.8 3.5  - 5.1 mmol/L   Chloride 107 101 - 111 mmol/L   CO2 23 22 - 32 mmol/L   Glucose, Bld 97 65 - 99 mg/dL   BUN 14 6 - 20 mg/dL   Creatinine, Ser 0.81 0.44 - 1.00 mg/dL   Calcium 8.3 (L) 8.9 - 10.3 mg/dL   GFR calc non Af Amer >60 >60 mL/min   GFR calc Af Amer >60 >60 mL/min    Comment: (NOTE) The eGFR has been calculated using the CKD EPI equation. This calculation has not been validated in all clinical situations. eGFR's persistently <60 mL/min signify possible Chronic Kidney Disease.    Anion gap 8 5 - 15  Albumin     Status: Abnormal   Collection Time: 04/08/17  6:04 AM  Result Value Ref Range   Albumin 3.1 (L) 3.5 - 5.0 g/dL   Dg Abd 1 View  Result Date: 04/08/2017 CLINICAL DATA:  Abdominal pain. EXAM: ABDOMEN - 1 VIEW COMPARISON:  CT abdomen pelvis 04/07/2017. FINDINGS: Mild residual small bowel dilatation. Scattered colonic gas and stool. No unexpected radiopaque calculi. Right hemidiaphragm is elevated. IMPRESSION: Mild residual small bowel dilatation. No definitive evidence of an obstruction. Electronically Signed   By: Lorin Picket M.D.   On: 04/08/2017 08:35   Ct Abdomen Pelvis W Contrast  Result Date: 04/07/2017 CLINICAL DATA:  83 year old presenting with a four-day history of bilious vomiting and abdominal distention. EXAM: CT ABDOMEN AND PELVIS WITH CONTRAST TECHNIQUE: Multidetector CT imaging of the abdomen and pelvis was performed using the standard protocol following bolus administration of intravenous contrast. CONTRAST:  192m ISOVUE-300 IOPAMIDOL INJECTION 61% IV. COMPARISON:  CTA abdomen 06/22/2007. CT abdomen and pelvis 06/21/2007. FINDINGS: Lower chest: Mild dependent atelectasis posteriorly in the lower lobes. Visualized lung bases otherwise clear. Heart size upper normal. Hepatobiliary: Mild diffuse hepatic steatosis without significant focal hepatic parenchymal abnormality. Calcified granuloma in the right lobe of the liver. Solitary 9 mm calcified gallstone in  the otherwise normal-appearing gallbladder. No biliary ductal dilation. Pancreas: Mild diffuse pancreatic atrophy. No pancreatic masses. No peripancreatic inflammation. Spleen: Normal in size and appearance. Adrenals/Urinary Tract: Normal appearing adrenal glands. Small exophytic cyst arising from the lateral mid right kidney. No significant focal parenchymal abnormality involving either kidney. No hydronephrosis. No visible urinary tract calculi. Urinary bladder decompressed and unremarkable. Stomach/Bowel: Stomach normal in appearance for the degree of distention. Multiple dilated loops of jejunum and proximal ileum with mucosal enhancement. The mid and distal ileum are decompressed, but also demonstrate mucosal enhancement. There are scattered small bowel diverticula. Entire colon decompressed with scattered areas of mucosal enhancement. No visible colonic diverticula. Lipoma involving the ileocecal valve. Appendix not visualized, but  no pericecal inflammation. Vascular/Lymphatic: Severe aortoiliofemoral atherosclerosis without evidence of aneurysm. Normal-appearing portal venous and systemic venous systems. Borderline portacaval and porta hepatis lymph nodes, unchanged since the 2009 CT. No new or significant lymphadenopathy in the abdomen or pelvis. Reproductive: Uterus surgically absent.  No adnexal masses. Other: Small amount of ascites in the right side of the low pelvis. Musculoskeletal: Osseous demineralization. Degenerative disc disease and disc extrusion at L5-S1 with a desiccated disc fragment behind the S1 vertebral body. Mild compression fracture involving the upper endplate of L2 on the order of 30% or so, new since 2009 but not felt to be acute. IMPRESSION: 1. Diffuse enteritis involving essentially the entire small bowel, with dilation of the jejunum and proximal ileum. The mid and distal ileum and the entire colon are decompressed, but I do not identify an obstructing small bowel mass or a  definite adhesion to confirm a small bowel obstruction. 2. Small amount of dependent ascites in the right side of the pelvis. 3. Cholelithiasis without evidence of acute cholecystitis. 4. Diffuse hepatic steatosis without focal hepatic parenchymal abnormality. 5. Mild diffuse pancreatic atrophy. 6.  Aortic Atherosclerosis (ICD10-170.0) Electronically Signed   By: Evangeline Dakin M.D.   On: 04/07/2017 18:49      Assessment/Plan Dementia Hx of TIA HTN HLD RLS ?UTI - UA with many bacteria but nitrite negative, cx pending  SBO vs Enteritis - CT: diffuse enteritis with dilation of the jejunum and proximal ileum, no definite obstruction - XR: mild residual small bowel dilatation, no definitive obstruction - WBC 9.8 from 12.8 on 10/22, afebrile - continue bowel rest, IVF, abx - I do not think this patient needs an NGT as she is not actively vomiting and has not been complaining of nausea - No indications for surgical intervention at this time.   FEN: NPO with sips of clears, IVF VTE: SCDs ID: Rocephin (10/23>>), flagyl (10/24>>)  Plan: Bowel rest. Continue antibiotics. We will follow.   Brigid Re, [facility] Surgery 04/08/2017, 11:37 AM Pager: [phone] Consults: [phone] Mon-Fri 7:00 am-4:30 pm Sat-Sun 7:00 am-11:30 am

## 2017-04-09 LAB — URINE CULTURE

## 2017-04-09 LAB — BASIC METABOLIC PANEL
Anion gap: 8 (ref 5–15)
BUN: 8 mg/dL (ref 6–20)
CALCIUM: 8.3 mg/dL — AB (ref 8.9–10.3)
CO2: 27 mmol/L (ref 22–32)
CREATININE: 0.68 mg/dL (ref 0.44–1.00)
Chloride: 108 mmol/L (ref 101–111)
GFR calc Af Amer: >60 mL/min (ref >60)
GFR calc non Af Amer: >60 mL/min (ref >60)
GLUCOSE: 91 mg/dL (ref 65–99)
Potassium: 3.1 mmol/L — ABNORMAL LOW (ref 3.5–5.1)
Sodium: 143 mmol/L (ref 135–145)

## 2017-04-09 LAB — CBC
HEMATOCRIT: 33.3 % — AB (ref 36.0–46.0)
Hemoglobin: 11.3 g/dL — ABNORMAL LOW (ref 12.0–15.0)
MCH: 29.3 pg (ref 26.0–34.0)
MCHC: 33.9 g/dL (ref 30.0–36.0)
MCV: 86.3 fL (ref 78.0–100.0)
Platelets: 199 10*3/uL (ref 150–400)
RBC: 3.86 MIL/uL — ABNORMAL LOW (ref 3.87–5.11)
RDW: 13.4 % (ref 11.5–15.5)
WBC: 6.1 10*3/uL (ref 4.0–10.5)

## 2017-04-09 LAB — MAGNESIUM: Magnesium: 1.5 mg/dL — ABNORMAL LOW (ref 1.7–2.4)

## 2017-04-09 MED ORDER — HALOPERIDOL LACTATE 5 MG/ML IJ SOLN
2.0000 mg | Freq: Once | INTRAMUSCULAR | Status: AC | PRN
  Administered 2017-04-09: 2 mg via INTRAVENOUS
  Filled 2017-04-09: qty 1

## 2017-04-09 MED ORDER — POLYETHYLENE GLYCOL 3350 17 G PO PACK
17.0000 g | PACK | Freq: Two times a day (BID) | ORAL | Status: DC
  Administered 2017-04-09 – 2017-04-12 (×5): 17 g via ORAL
  Filled 2017-04-09 (×5): qty 1

## 2017-04-09 MED ORDER — LORAZEPAM 2 MG/ML IJ SOLN
0.2500 mg | Freq: Four times a day (QID) | INTRAMUSCULAR | Status: DC | PRN
  Administered 2017-04-11 – 2017-04-12 (×3): 0.25 mg via INTRAVENOUS
  Filled 2017-04-09 (×3): qty 1

## 2017-04-09 MED ORDER — SENNA 8.6 MG PO TABS
1.0000 | ORAL_TABLET | Freq: Every day | ORAL | Status: DC
  Administered 2017-04-09 – 2017-04-11 (×2): 8.6 mg via ORAL
  Filled 2017-04-09 (×3): qty 1

## 2017-04-09 MED ORDER — LORAZEPAM 2 MG/ML IJ SOLN
0.2500 mg | Freq: Once | INTRAMUSCULAR | Status: AC
  Administered 2017-04-09: 0.25 mg via INTRAVENOUS
  Filled 2017-04-09: qty 1

## 2017-04-09 MED ORDER — MAGNESIUM SULFATE 4 GM/100ML IV SOLN
4.0000 g | Freq: Once | INTRAVENOUS | Status: AC
  Administered 2017-04-09: 4 g via INTRAVENOUS
  Filled 2017-04-09: qty 100

## 2017-04-09 MED ORDER — HALOPERIDOL LACTATE 5 MG/ML IJ SOLN
2.0000 mg | Freq: Four times a day (QID) | INTRAMUSCULAR | Status: DC | PRN
  Administered 2017-04-10 – 2017-04-12 (×5): 2 mg via INTRAVENOUS
  Filled 2017-04-09 (×6): qty 1

## 2017-04-09 MED ORDER — POTASSIUM CHLORIDE CRYS ER 20 MEQ PO TBCR
40.0000 meq | EXTENDED_RELEASE_TABLET | ORAL | Status: AC
  Administered 2017-04-09: 40 meq via ORAL
  Filled 2017-04-09 (×2): qty 2

## 2017-04-09 MED ORDER — LISINOPRIL 20 MG PO TABS
40.0000 mg | ORAL_TABLET | Freq: Every day | ORAL | Status: DC
  Administered 2017-04-09 – 2017-04-12 (×4): 40 mg via ORAL
  Filled 2017-04-09 (×4): qty 2

## 2017-04-09 NOTE — Progress Notes (Signed)
[facility] Progress Note     Subjective: CC: sbo vs enteritis Patient tolerating CLD. Resting comfortably in chair, sitter present but no family present this AM. One small stool recorded overnight, mushy and green.   Objective: Vital signs in last 24 hours: Temp:  [98 F (36.7 C)-98.4 F (36.9 C)] 98.1 F (36.7 C) (10/25 0412) Pulse Rate:  [93-95] 93 (10/25 0412) Resp:  [18] 18 (10/25 0412) BP: (143-148)/(68-83) 144/83 (10/25 0412) SpO2:  [90 %-94 %] 90 % (10/24 2006) Last BM Date: 04/07/17  Intake/Output from previous day: 10/24 0701 - 10/25 0700 In: 2348.8 [P.O.:240; I.V.:1808.8; IV Piggyback:300] Out: 1900 [Urine:1900] Intake/Output this shift: Total I/O In: -  Out: 200 [Urine:200]  PE: Gen:  Alert, NAD, pleasant Card:  Regular rate and rhythm, pedal pulses 2+ BL Pulm:  Normal effort, clear to auscultation bilaterally Abd: Soft, non-tender, mildly distended, bowel sounds hypoactive Skin: warm and dry, no rashes  Psych: A&Ox3   Lab Results:   Recent Labs  04/08/17 0604 04/09/17 0541  WBC 9.8 6.1  HGB 12.7 11.3*  HCT 37.3 33.3*  PLT 214 199   BMET  Recent Labs  04/08/17 0604 04/09/17 0541  NA 138 143  K 3.8 3.1*  CL 107 108  CO2 23 27  GLUCOSE 97 91  BUN 14 8  CREATININE 0.81 0.68  CALCIUM 8.3* 8.3*   PT/INR No results for input(s): LABPROT, INR in the last 72 hours. CMP     Component Value Date/Time   NA 143 04/09/2017 0541   K 3.1 (L) 04/09/2017 0541   CL 108 04/09/2017 0541   CO2 27 04/09/2017 0541   GLUCOSE 91 04/09/2017 0541   BUN 8 04/09/2017 0541   CREATININE 0.68 04/09/2017 0541   CALCIUM 8.3 (L) 04/09/2017 0541   PROT 7.3 04/07/2017 1726   ALBUMIN 3.1 (L) 04/08/2017 0604   AST 28 04/07/2017 1726   ALT 19 04/07/2017 1726   ALKPHOS 59 04/07/2017 1726   BILITOT 0.9 04/07/2017 1726   GFRNONAA >60 04/09/2017 0541   GFRAA >60 04/09/2017 0541   Lipase     Component Value Date/Time   LIPASE 36 04/07/2017 1726      Studies/Results: Dg Abd 1 View  Result Date: 04/08/2017 CLINICAL DATA:  Abdominal pain. EXAM: ABDOMEN - 1 VIEW COMPARISON:  CT abdomen pelvis 04/07/2017. FINDINGS: Mild residual small bowel dilatation. Scattered colonic gas and stool. No unexpected radiopaque calculi. Right hemidiaphragm is elevated. IMPRESSION: Mild residual small bowel dilatation. No definitive evidence of an obstruction. Electronically Signed   By: Leanna Battles M.D.   On: 04/08/2017 08:35   Ct Abdomen Pelvis W Contrast  Result Date: 04/07/2017 CLINICAL DATA:  83 year old presenting with a four-day history of bilious vomiting and abdominal distention. EXAM: CT ABDOMEN AND PELVIS WITH CONTRAST TECHNIQUE: Multidetector CT imaging of the abdomen and pelvis was performed using the standard protocol following bolus administration of intravenous contrast. CONTRAST:  ISOVUE-300 IOPAMIDOL INJECTION 61% IV. COMPARISON:  CTA abdomen 06/22/2007. CT abdomen and pelvis 06/21/2007. FINDINGS: Lower chest: Mild dependent atelectasis posteriorly in the lower lobes. Visualized lung bases otherwise clear. Heart size upper normal. Hepatobiliary: Mild diffuse hepatic steatosis without significant focal hepatic parenchymal abnormality. Calcified granuloma in the right lobe of the liver. Solitary 9 mm calcified gallstone in the otherwise normal-appearing gallbladder. No biliary ductal dilation. Pancreas: Mild diffuse pancreatic atrophy. No pancreatic masses. No peripancreatic inflammation. Spleen: Normal in size and appearance. Adrenals/Urinary Tract: Normal appearing adrenal glands. Small exophytic cyst  arising from the lateral mid right kidney. No significant focal parenchymal abnormality involving either kidney. No hydronephrosis. No visible urinary tract calculi. Urinary bladder decompressed and unremarkable. Stomach/Bowel: Stomach normal in appearance for the degree of distention. Multiple dilated loops of jejunum and proximal ileum with  mucosal enhancement. The mid and distal ileum are decompressed, but also demonstrate mucosal enhancement. There are scattered small bowel diverticula. Entire colon decompressed with scattered areas of mucosal enhancement. No visible colonic diverticula. Lipoma involving the ileocecal valve. Appendix not visualized, but no pericecal inflammation. Vascular/Lymphatic: Severe aortoiliofemoral atherosclerosis without evidence of aneurysm. Normal-appearing portal venous and systemic venous systems. Borderline portacaval and porta hepatis lymph nodes, unchanged since the 2009 CT. No new or significant lymphadenopathy in the abdomen or pelvis. Reproductive: Uterus surgically absent.  No adnexal masses. Other: Small amount of ascites in the right side of the low pelvis. Musculoskeletal: Osseous demineralization. Degenerative disc disease and disc extrusion at L5-S1 with a desiccated disc fragment behind the S1 vertebral body. Mild compression fracture involving the upper endplate of L2 on the order of 30% or so, new since 2009 but not felt to be acute. IMPRESSION: 1. Diffuse enteritis involving essentially the entire small bowel, with dilation of the jejunum and proximal ileum. The mid and distal ileum and the entire colon are decompressed, but I do not identify an obstructing small bowel mass or a definite adhesion to confirm a small bowel obstruction. 2. Small amount of dependent ascites in the right side of the pelvis. 3. Cholelithiasis without evidence of acute cholecystitis. 4. Diffuse hepatic steatosis without focal hepatic parenchymal abnormality. 5. Mild diffuse pancreatic atrophy. 6.  Aortic Atherosclerosis (ICD10-170.0) Electronically Signed   By: Hulan Saashomas  Lawrence M.D.   On: 04/07/2017 18:49    Anti-infectives: Anti-infectives    Start     Dose/Rate Route Frequency Ordered Stop   04/08/17 2000  cefTRIAXone (ROCEPHIN) 2 g in dextrose 5 % 50 mL IVPB     2 g 100 mL/hr over 30 Minutes Intravenous Every 24 hours  04/08/17 0953     04/08/17 1200  metroNIDAZOLE (FLAGYL) IVPB 500 mg     500 mg 100 mL/hr over 60 Minutes Intravenous Every 8 hours 04/08/17 0953     04/07/17 2130  cefTRIAXone (ROCEPHIN) 2 g in dextrose 5 % 50 mL IVPB     2 g 100 mL/hr over 30 Minutes Intravenous  Once 04/07/17 2124 04/07/17 2241       Assessment/Plan Dementia Hx of TIA HTN HLD RLS ?UTI - UA with many bacteria but nitrite negative, cx pending  SBO vs Enteritis - CT: diffuse enteritis with dilation of the jejunum and proximal ileum, no definite obstruction - XR: mild residual small bowel dilatation, no definitive obstruction - WBC 6.1, afebrile - continue IVF and abx  - tolerating CLD - advance diet as tolerated  - had a BM yesterday - GI panel pending - No indications for surgical intervention at this time.  Hypokalemia - K 3.1, replace Hypomagnesemia - 1.5, replace  FEN: CLD, advance as tolerated, IVF; replace K VTE: SCDs ID: Rocephin (10/23>>), flagyl (10/24>>)  Plan: Advance diet as tolerated. Continue abx. GI panel pending.   LOS: 1 day    Wells GuilesKelly Rayburn , [facility] 04/09/2017, 10:47 AM Pager: [phone] Consults: [phone] Mon-Fri 7:00 am-4:30 pm Sat-Sun 7:00 am-11:30 am

## 2017-04-09 NOTE — Progress Notes (Signed)
OT Cancellation Note  Patient Details Name: Heather Floyd MRN: [id number] DOB: 05/12/1933   Cancelled Treatment:    Reason Eval/Treat Not Completed: Other (comment)  Spoke with RN who recommended OT  be held on today due to agitation. Will check back another day.    Alba CoryREDDING, [name] 04/09/2017, 11:46 AM

## 2017-04-09 NOTE — Progress Notes (Signed)
PROGRESS NOTE    Heather Floyd  ZOX:[id number] DOB: [birth year] DOA: 04/07/2017 PCP: Daisy Floro, MD   Brief Narrative:  83 y.o. female with medical history significant of dementia, HTN, HLD, and RLS; who presents with complaints of abdominal pain over the last 4 days.  History is obtained from the patient's daughter and she has significant dementia. She seems to have generalized abdominal pain, but holds her epigastric region. They had tried giving her some crackers, but patient was unable to tolerate this and has vomited bile appearing emesis multiple times. Associated symptoms include nausea, belching, abdominal distention, constipation, and very poor by mouth appetite. Her last bowel movement may have been 3 days ago. She is on Dulcolax at baseline as her medications cause her to be constipated. Family notes that the patient has a sitter easily during the day, but has significant sundowning at night and likely will need a sitter. She also normally has all medications crushed in applesauce.  She was admitted for CT findings c/w enteritis with possible SBO.    Assessment & Plan:   Principal Problem:   Enteritis Active Problems:   Hypertension   Essential hypertension   Abdominal pain   Leukocytosis   Dementia   Dehydration   Abnormal urinalysis   Abdominal pain 2/2 enteritis: Acute. Patient reports having presents with complaints of abdominal distention with pain. CT scan showing signs of enteritis and possible early small bowel obstruction.  Seems improved. - Ceftriaxone/flagyl for enteritis  - GI path panel ordered (no C diff) - Follow up blood culture, urine cx - Clear liquid diet - Monitor ins and outs - IV fluids normal saline at 75 cc/hr - Surgery consult for possible SBO (x ray this yesterday without definitive evidence, mild residual small bowel dilatation).  She had 1 BM recorded since yesterday (but nursing wasn't aware of this when I discussed with them this  morning) -> surgery following  Nausea and vomiting:  - Continue antiemetics as needed  Abnormal urinalysis: Acute. Urinalysis showing 6-30 WBCs with small leukocytes. Patient was given 1 dose of Rocephin in the emergency department - Follow-up urine culture -> multiple species, recollect and culture if symptoms.   Dehydration: Improved.    Leukocytosis: WBC elevated at 10.6 on admission. Suspect secondary to the above. - improved   Hypokalemia:  - Continue to monitor and replace as needed - low mag as well, replete  Dementia with behavioral disturbance - she's been intermittently agitated requiring prn haldol and ativan - restart nightly risperdal, hold IV haldol unless needed. - sitter at bedside - delirium precautions  Essential hypertension - amlodipine and lisinopril  RLS   DVT prophylaxis: lovenox Code Status: full  Family Communication: discussed with caregiver at bedside, plan to call daughter [name] Disposition Plan: pending improvement   Consultants:   General Surgery  Procedures: (Don't include imaging studies which can be auto populated. Include things that cannot be auto populated i.e. Echo, Carotid and venous dopplers, Foley, Bipap, HD, tubes/drains, wound vac, central lines etc)  none  Antimicrobials: (specify start and planned stop date. Auto populated tables are space occupying and do not give end dates)  Ceftriaxone/flagy    Subjective: Denies current symptoms.  Pleasantly confused.    Objective: Vitals:   04/08/17 0748 04/08/17 1328 04/08/17 2006 04/09/17 0412  BP: (!) 136/52 (!) 143/68 (!) 148/79 (!) 144/83  Pulse: (!) 113 93 95 93  Resp: 18 18 18 18   Temp: 98.7 F (37.1 C) 98.4 F (36.9  C) 98 F (36.7 C) 98.1 F (36.7 C)  TempSrc: Oral Oral Oral Oral  SpO2: 90% 94% 90%   Weight:      Height:        Intake/Output Summary (Last 24 hours) at 04/09/17 0906 Last data filed at 04/09/17 0845  Gross per 24 hour  Intake           2348.75 ml  Output             2100 ml  Net           248.75 ml   Filed Weights   04/07/17 1653  Weight: 56.7 kg (125 lb)    Examination:  General: No acute distress. Cardiovascular: Heart sounds show a regular rate, and rhythm. No gallops or rubs. No murmurs.  Lungs: Clear to auscultation bilaterally with good air movement. No rales, rhonchi or wheezes. Abdomen: Soft, nontender, mildly distended.  No masses. No hepatosplenomegaly. Neurological: Disoriented. Moves all extremities 4 with equal strength. Cranial nerves II through XII grossly intact. Skin: Warm and dry. No rashes or lesions. Extremities: No clubbing or cyanosis. No edema. Pedal pulses 2+. Psychiatric: Mood and affect are normal. Insight and judgment are impaired.  Nonsensical speech.  Talking about being away for a few weeks, balancing checkbook. Etc.   Data Reviewed: I have personally reviewed following labs and imaging studies  CBC:  Recent Labs Lab 04/06/17 2027 04/07/17 1726 04/08/17 0604 04/09/17 0541  WBC 12.8* 10.7* 9.8 6.1  NEUTROABS  --  8.4*  --   --   HGB 14.9 15.1* 12.7 11.3*  HCT 44.4 43.5 37.3 33.3*  MCV 86.9 86.3 86.9 86.3  PLT 304 288 214 199   Basic Metabolic Panel:  Recent Labs Lab 04/06/17 2027 04/07/17 1726 04/08/17 0604 04/09/17 0541  NA 140 139 138 143  K 3.4* 3.3* 3.8 3.1*  CL 98* 97* 107 108  CO2 30 27 23 27   GLUCOSE 126* 128* 97 91  BUN 19 21* 14 8  CREATININE 0.96 0.96 0.81 0.68  CALCIUM 9.9 9.8 8.3* 8.3*   GFR: Estimated Creatinine Clearance: 42.9 mL/min (by C-G formula based on SCr of 0.68 mg/dL). Liver Function Tests:  Recent Labs Lab 04/06/17 2027 04/07/17 1726 04/08/17 0604  AST 27 28  --   ALT 20 19  --   ALKPHOS 57 59  --   BILITOT 0.8 0.9  --   PROT 7.4 7.3  --   ALBUMIN 4.1 4.0 3.1*    Recent Labs Lab 04/06/17 2027 04/07/17 1726  LIPASE 41 36   No results for input(s): AMMONIA in the last 168 hours. Coagulation Profile: No results for  input(s): INR, PROTIME in the last 168 hours. Cardiac Enzymes: No results for input(s): CKTOTAL, CKMB, CKMBINDEX, TROPONINI in the last 168 hours. BNP (last 3 results) No results for input(s): PROBNP in the last 8760 hours. HbA1C: No results for input(s): HGBA1C in the last 72 hours. CBG: No results for input(s): GLUCAP in the last 168 hours. Lipid Profile: No results for input(s): CHOL, HDL, LDLCALC, TRIG, CHOLHDL, LDLDIRECT in the last 72 hours. Thyroid Function Tests: No results for input(s): TSH, T4TOTAL, FREET4, T3FREE, THYROIDAB in the last 72 hours. Anemia Panel: No results for input(s): VITAMINB12, FOLATE, FERRITIN, TIBC, IRON, RETICCTPCT in the last 72 hours. Sepsis Labs: No results for input(s): PROCALCITON, LATICACIDVEN in the last 168 hours.  Recent Results (from the past 240 hour(s))  Urine culture     Status: Abnormal  Collection Time: 04/07/17 10:00 PM  Result Value Ref Range Status   Specimen Description URINE, RANDOM  Final   Special Requests NONE  Final   Culture MULTIPLE SPECIES PRESENT, SUGGEST RECOLLECTION (A)  Final   Report Status 04/09/2017 FINAL  Final  Blood culture (routine x 2)     Status: None (Preliminary result)   Collection Time: 04/07/17 10:07 PM  Result Value Ref Range Status   Specimen Description BLOOD LEFT ANTECUBITAL  Final   Special Requests   Final    BOTTLES DRAWN AEROBIC AND ANAEROBIC Blood Culture adequate volume   Culture   Final    NO GROWTH < 12 HOURS Performed at [facility], [address] [place], [place] [address]    Report Status PENDING  Incomplete  Blood culture (routine x 2)     Status: None (Preliminary result)   Collection Time: 04/07/17 10:09 PM  Result Value Ref Range Status   Specimen Description BLOOD RIGHT ANTECUBITAL  Final   Special Requests   Final    BOTTLES DRAWN AEROBIC AND ANAEROBIC Blood Culture adequate volume   Culture   Final    NO GROWTH < 12 HOURS Performed at [facility], [address], [place], [place] [address]    Report Status PENDING  Incomplete         Radiology Studies: Dg Abd 1 View  Result Date: 04/08/2017 CLINICAL DATA:  Abdominal pain. EXAM: ABDOMEN - 1 VIEW COMPARISON:  CT abdomen pelvis 04/07/2017. FINDINGS: Mild residual small bowel dilatation. Scattered colonic gas and stool. No unexpected radiopaque calculi. Right hemidiaphragm is elevated. IMPRESSION: Mild residual small bowel dilatation. No definitive evidence of an obstruction. Electronically Signed   By: Leanna Battles M.D.   On: 04/08/2017 08:35   Ct Abdomen Pelvis W Contrast  Result Date: 04/07/2017 CLINICAL DATA:  83 year old presenting with a four-day history of bilious vomiting and abdominal distention. EXAM: CT ABDOMEN AND PELVIS WITH CONTRAST TECHNIQUE: Multidetector CT imaging of the abdomen and pelvis was performed using the standard protocol following bolus administration of intravenous contrast. CONTRAST:  ISOVUE-300 IOPAMIDOL INJECTION 61% IV. COMPARISON:  CTA abdomen 06/22/2007. CT abdomen and pelvis 06/21/2007. FINDINGS: Lower chest: Mild dependent atelectasis posteriorly in the lower lobes. Visualized lung bases otherwise clear. Heart size upper normal. Hepatobiliary: Mild diffuse hepatic steatosis without significant focal hepatic parenchymal abnormality. Calcified granuloma in the right lobe of the liver. Solitary 9 mm calcified gallstone in the otherwise normal-appearing gallbladder. No biliary ductal dilation. Pancreas: Mild diffuse pancreatic atrophy. No pancreatic masses. No peripancreatic inflammation. Spleen: Normal in size and appearance. Adrenals/Urinary Tract: Normal appearing adrenal glands. Small exophytic cyst arising from the lateral mid right kidney. No significant focal parenchymal abnormality involving either kidney. No hydronephrosis. No visible urinary tract calculi. Urinary bladder decompressed and unremarkable. Stomach/Bowel: Stomach normal in appearance for the  degree of distention. Multiple dilated loops of jejunum and proximal ileum with mucosal enhancement. The mid and distal ileum are decompressed, but also demonstrate mucosal enhancement. There are scattered small bowel diverticula. Entire colon decompressed with scattered areas of mucosal enhancement. No visible colonic diverticula. Lipoma involving the ileocecal valve. Appendix not visualized, but no pericecal inflammation. Vascular/Lymphatic: Severe aortoiliofemoral atherosclerosis without evidence of aneurysm. Normal-appearing portal venous and systemic venous systems. Borderline portacaval and porta hepatis lymph nodes, unchanged since the 2009 CT. No new or significant lymphadenopathy in the abdomen or pelvis. Reproductive: Uterus surgically absent.  No adnexal masses. Other: Small amount of ascites in  the right side of the low pelvis. Musculoskeletal: Osseous demineralization. Degenerative disc disease and disc extrusion at L5-S1 with a desiccated disc fragment behind the S1 vertebral body. Mild compression fracture involving the upper endplate of L2 on the order of 30% or so, new since 2009 but not felt to be acute. IMPRESSION: 1. Diffuse enteritis involving essentially the entire small bowel, with dilation of the jejunum and proximal ileum. The mid and distal ileum and the entire colon are decompressed, but I do not identify an obstructing small bowel mass or a definite adhesion to confirm a small bowel obstruction. 2. Small amount of dependent ascites in the right side of the pelvis. 3. Cholelithiasis without evidence of acute cholecystitis. 4. Diffuse hepatic steatosis without focal hepatic parenchymal abnormality. 5. Mild diffuse pancreatic atrophy. 6.  Aortic Atherosclerosis (ICD10-170.0) Electronically Signed   By: Hulan Saas M.D.   On: 04/07/2017 18:49        Scheduled Meds: . amLODipine  5 mg Oral Daily  . enoxaparin (LOVENOX) injection  40 mg Subcutaneous QHS  . LORazepam  0.25 mg  Intravenous Once  . potassium chloride  40 mEq Oral Q4H  . risperiDONE  0.5 mg Oral QHS   Continuous Infusions: . sodium chloride 75 mL/hr at 04/07/17 2210  . cefTRIAXone (ROCEPHIN)  IV 2 g (04/08/17 2107)  . metronidazole 500 mg (04/09/17 0400)     LOS: 1 day    Time spent: over 30 minutes    Lacretia Nicks, MD [facility] Pager [phone]   If 7PM-7AM, please contact night-coverage [email or web address] Password [email or web address] 04/09/2017, 9:06 AM

## 2017-04-09 NOTE — Progress Notes (Signed)
PT Cancellation Note  Patient Details Name: Heather PandaGail M Rueger MRN: [id number] DOB: 07/01/1932   Cancelled Treatment:    Reason Eval/Treat Not Completed: Attempted PT eval. Spoke with RN who recommended PT be held on today due to agitation. Will check back another day.    Rebeca AlertJannie Ferguson Gertner, MPT Pager: [phone]

## 2017-04-10 LAB — BASIC METABOLIC PANEL
Anion gap: 11 (ref 5–15)
BUN: <5 mg/dL — ABNORMAL LOW (ref 6–20)
CHLORIDE: 105 mmol/L (ref 101–111)
CO2: 26 mmol/L (ref 22–32)
Calcium: 8.5 mg/dL — ABNORMAL LOW (ref 8.9–10.3)
Creatinine, Ser: 0.6 mg/dL (ref 0.44–1.00)
Glucose, Bld: 97 mg/dL (ref 65–99)
POTASSIUM: 2.8 mmol/L — AB (ref 3.5–5.1)
SODIUM: 142 mmol/L (ref 135–145)

## 2017-04-10 LAB — MAGNESIUM: MAGNESIUM: 2.2 mg/dL (ref 1.7–2.4)

## 2017-04-10 MED ORDER — HYDRALAZINE HCL 20 MG/ML IJ SOLN
10.0000 mg | INTRAMUSCULAR | Status: DC | PRN
  Administered 2017-04-10 – 2017-04-11 (×2): 10 mg via INTRAVENOUS
  Filled 2017-04-10 (×2): qty 1

## 2017-04-10 MED ORDER — POTASSIUM CHLORIDE CRYS ER 20 MEQ PO TBCR
40.0000 meq | EXTENDED_RELEASE_TABLET | Freq: Three times a day (TID) | ORAL | Status: AC
  Administered 2017-04-10 (×3): 40 meq via ORAL
  Filled 2017-04-10 (×3): qty 2

## 2017-04-10 NOTE — Progress Notes (Signed)
Patient stated "H look at that puppy. Or is it a cat?" while pointing at a towel. The patient also said "what is this pool of water?" while pointing at the bedside table.

## 2017-04-10 NOTE — Progress Notes (Addendum)
PROGRESS NOTE    Heather Floyd  GNF:[id number] DOB: January 01, 1933 DOA: 04/07/2017 PCP: Daisy Floro, MD   Brief Narrative:  83 y.o. female with medical history significant of dementia, HTN, HLD, and RLS; who presents with complaints of abdominal pain over the last 4 days.  History is obtained from the patient's daughter and she has significant dementia. She seems to have generalized abdominal pain, but holds her epigastric region. They had tried giving her some crackers, but patient was unable to tolerate this and has vomited bile appearing emesis multiple times. Associated symptoms include nausea, belching, abdominal distention, constipation, and very poor by mouth appetite. Her last bowel movement may have been 3 days ago. She is on Dulcolax at baseline as her medications cause her to be constipated. Family notes that the patient has a sitter easily during the day, but has significant sundowning at night and likely will need a sitter. She also normally has all medications crushed in applesauce. She was admitted for CT findings c/w enteritis with possible SBO.    Assessment & Plan:   Principal Problem:   Enteritis Active Problems:   Hypertension   Essential hypertension   Abdominal pain   Leukocytosis   Dementia   Dehydration   Abnormal urinalysis   Abdominal pain 2/2 enteritis  -Acute. Patient reports having presents with complaints of abdominal distention with pain. CT scan showing signs of enteritis and possible early small bowel obstruction: "diffuse enteritis involving essentially the entire small bowel, with dilation of the jejunum and proximal ileum" -continue ceftriaxone/flagyl for enteritis  -GI path panel pending  -Follow up blood culture, urine cx -Clear liquid diet, advance today to full liquids -General surgery consulted for potential SBO, signed off on 10/25  Nausea and vomiting -Continue antiemetics as needed, no further episodes of vomiting overnight, advance  diet as above  Abnormal urinalysis -Acute. Urinalysis showing 6-30 WBCs with small leukocytes. Patient was given 1 dose of Rocephin in the emergency department -Urine culture final, with multiple species present  Leukocytosis -WBC elevated at 10.6 on admission. Suspect secondary to the above. -White count is now normalized at 6.1   Hypokalemia -Continue to monitor and replace as needed, 2.8 this morning, continue to replete  Dementia with behavioral disturbance  -restart nightly risperdal, hold IV haldol unless needed. -sitter at bedside  Essential hypertension -Hydralazine 5 mg IV q 4hr prn >sBP/dBP -resume amlodipine, currently holding home lisinopril   DVT prophylaxis: Lovenox Code Status: full  Family Communication: no family at bedside Disposition Plan: pending improvement   Consultants:   General Surgery  Procedures: none  Antimicrobials  Ceftriaxone/flagy 10/25 >>   Subjective: -Complains of abdominal pain/cramping, no chest pain, shortness of breath.  Objective: Vitals:   04/08/17 2006 04/09/17 0412 04/09/17 1400 04/09/17 2204  BP: (!) 148/79 (!) 144/83 (!) 160/83 (!) 160/75  Pulse: 95 93 97 (!) 101  Resp: 18 18 16 18   Temp: 98 F (36.7 C) 98.1 F (36.7 C) (!) 97.5 F (36.4 C) 98.3 F (36.8 C)  TempSrc: Oral Oral Oral Oral  SpO2: 90%  93% 94%  Weight:      Height:        Intake/Output Summary (Last 24 hours) at 04/10/17 0947 Last data filed at 04/10/17 0600  Gross per 24 hour  Intake              400 ml  Output              400 ml  Net                0 ml   Filed Weights   04/07/17 1653  Weight: 56.7 kg (125 lb)    Examination:  General exam: No acute distress, demented Respiratory system: Moves air well, clear to auscultation bilaterally, no wheezing, no crackles, normal respiratory effort Cardiovascular system: Regular rate and rhythm, no murmurs.  No JVD.  No peripheral edema. Gastrointestinal system: Soft, nontender to  palpation, no guarding, no rebound.  Bowel sounds positive. Central nervous system: No focal deficits, equal strength alert and oriented to person Extremities: Decreased muscle mass Skin: No rashes   Data Reviewed: I have personally reviewed following labs and imaging studies  CBC:  Recent Labs Lab 04/06/17 2027 04/07/17 1726 04/08/17 0604 04/09/17 0541  WBC 12.8* 10.7* 9.8 6.1  NEUTROABS  --  8.4*  --   --   HGB 14.9 15.1* 12.7 11.3*  HCT 44.4 43.5 37.3 33.3*  MCV 86.9 86.3 86.9 86.3  PLT 304 288 214 199   Basic Metabolic Panel:  Recent Labs Lab 04/06/17 2027 04/07/17 1726 04/08/17 0604 04/09/17 0541 04/10/17 0539  NA 140 139 138 143 142  K 3.4* 3.3* 3.8 3.1* 2.8*  CL 98* 97* 107 108 105  CO2 30 27 23 27 26   GLUCOSE 126* 128* 97 91 97  BUN 19 21* 14 8 <5*  CREATININE 0.96 0.96 0.81 0.68 0.60  CALCIUM 9.9 9.8 8.3* 8.3* 8.5*  MG  --   --   --  1.5* 2.2   GFR: Estimated Creatinine Clearance: 42.9 mL/min (by C-G formula based on SCr of 0.6 mg/dL). Liver Function Tests:  Recent Labs Lab 04/06/17 2027 04/07/17 1726 04/08/17 0604  AST 27 28  --   ALT 20 19  --   ALKPHOS 57 59  --   BILITOT 0.8 0.9  --   PROT 7.4 7.3  --   ALBUMIN 4.1 4.0 3.1*    Recent Labs Lab 04/06/17 2027 04/07/17 1726  LIPASE 41 36   No results for input(s): AMMONIA in the last 168 hours. Coagulation Profile: No results for input(s): INR, PROTIME in the last 168 hours. Cardiac Enzymes: No results for input(s): CKTOTAL, CKMB, CKMBINDEX, TROPONINI in the last 168 hours. BNP (last 3 results) No results for input(s): PROBNP in the last 8760 hours. HbA1C: No results for input(s): HGBA1C in the last 72 hours. CBG: No results for input(s): GLUCAP in the last 168 hours. Lipid Profile: No results for input(s): CHOL, HDL, LDLCALC, TRIG, CHOLHDL, LDLDIRECT in the last 72 hours. Thyroid Function Tests: No results for input(s): TSH, T4TOTAL, FREET4, T3FREE, THYROIDAB in the last 72  hours. Anemia Panel: No results for input(s): VITAMINB12, FOLATE, FERRITIN, TIBC, IRON, RETICCTPCT in the last 72 hours. Sepsis Labs: No results for input(s): PROCALCITON, LATICACIDVEN in the last 168 hours.  Recent Results (from the past 240 hour(s))  Urine culture     Status: Abnormal   Collection Time: 04/07/17 10:00 PM  Result Value Ref Range Status   Specimen Description URINE, RANDOM  Final   Special Requests NONE  Final   Culture MULTIPLE SPECIES PRESENT, SUGGEST RECOLLECTION (A)  Final   Report Status 04/09/2017 FINAL  Final  Blood culture (routine x 2)     Status: None (Preliminary result)   Collection Time: 04/07/17 10:07 PM  Result Value Ref Range Status   Specimen Description BLOOD LEFT ANTECUBITAL  Final   Special Requests   Final  BOTTLES DRAWN AEROBIC AND ANAEROBIC Blood Culture adequate volume   Culture   Final    NO GROWTH 2 DAYS Performed at [facility], [address], [place], [place] [address]    Report Status PENDING  Incomplete  Blood culture (routine x 2)     Status: None (Preliminary result)   Collection Time: 04/07/17 10:09 PM  Result Value Ref Range Status   Specimen Description BLOOD RIGHT ANTECUBITAL  Final   Special Requests   Final    BOTTLES DRAWN AEROBIC AND ANAEROBIC Blood Culture adequate volume   Culture   Final    NO GROWTH 2 DAYS Performed at [facility], [address] [place], [place] [address]    Report Status PENDING  Incomplete     Radiology Studies: No results found.   Scheduled Meds: . amLODipine  5 mg Oral Daily  . enoxaparin (LOVENOX) injection  40 mg Subcutaneous QHS  . lisinopril  40 mg Oral Daily  . polyethylene glycol  17 g Oral BID  . risperiDONE  0.5 mg Oral QHS  . senna  1 tablet Oral QHS   Continuous Infusions: . sodium chloride 75 mL/hr at 04/07/17 2210  . cefTRIAXone (ROCEPHIN)  IV 2 g (04/09/17 2007)  . metronidazole 500 mg (04/10/17 0400)     LOS: 2 days   Costin M. Elvera Lennox, MD [facility] [phone]  If 7PM-7AM, please contact night-coverage [email or web address] Password [email or web address]    04/10/2017, 9:47 AM

## 2017-04-10 NOTE — Evaluation (Signed)
Occupational Therapy Evaluation Patient Details Name: Heather Floyd MRN: [id number] DOB: 07/13/1932 Today's Date: 04/10/2017    History of Present Illness 83 yo female admitted with enteritis, hypokalemia. Hx of TIA, dementia,    Clinical Impression   Pt was admitted for the above. She is usually independent to supervision with ADLs. She currently needs min A for balance and cues for activities. Will follow in acute with supervision level goals.  Pt's daughter lives with her.    Follow Up Recommendations  Supervision/Assistance - 24 hour    Equipment Recommendations  None recommended by OT    Recommendations for Other Services       Precautions / Restrictions Precautions Precautions: Fall Restrictions Weight Bearing Restrictions: No      Mobility Bed Mobility Overal bed mobility: Needs Assistance Bed Mobility: Supine to Sit;Sit to Supine     Supine to sit: HOB elevated;Min assist Sit to supine: Supervision;HOB elevated   General bed mobility comments: Multimodal cues for initiation and completion of task. Increased time. assist given to get to EOB  Transfers Overall transfer level: Needs assistance Equipment used: 2 person hand held assist Transfers: Sit to/from Stand Sit to Stand: Min assist;+2 safety/equipment         General transfer comment: Assist to rise, stabilize, control descent.     Balance Overall balance assessment: Needs assistance           Standing balance-Leahy Scale: Fair                             ADL either performed or assessed with clinical judgement   ADL Overall ADL's : Needs assistance/impaired Eating/Feeding: Set up;Sitting   Grooming: Minimal assistance;Standing   Upper Body Bathing: Minimal assistance;Sitting   Lower Body Bathing: Minimal assistance;Sit to/from stand   Upper Body Dressing : Minimal assistance;Standing   Lower Body Dressing: Minimal assistance;Sit to/from stand   Toilet Transfer: Minimal  assistance;Ambulation;+2 for safety/equipment;BSC (+2 for lines)   Toileting- Clothing Manipulation and Hygiene: Minimal assistance;Sit to/from stand         General ADL Comments: Pt overall needs min A for balance/steadying.  Pt needs cues for thoroughness; was talking a lot and distracting herself     Vision         Perception     Praxis      Pertinent Vitals/Pain Pain Assessment: No/denies pain     Hand Dominance     Extremity/Trunk Assessment Upper Extremity Assessment Upper Extremity Assessment: Overall WFL for tasks assessed      Cervical / Trunk Assessment Cervical / Trunk Assessment: Kyphotic   Communication Communication Communication: No difficulties   Cognition Arousal/Alertness: Awake/alert Behavior During Therapy: WFL for tasks assessed/performed Overall Cognitive Status: History of cognitive impairments - at baseline                                 General Comments: pt is talkative and easily distracted at times   General Comments       Exercises     Shoulder Instructions      Home Living Family/patient expects to be discharged to:: Private residence Living Arrangements: Children Available Help at Discharge: Family;Available 24 hours/day Type of Home: House Home Access: Stairs to enter Entergy CorporationEntrance Stairs-Number of Steps: 2 Entrance Stairs-Rails: None Home Layout: One level  Additional Comments: information retrieved from prior admissions      Prior Functioning/Environment Level of Independence: Independent        Comments: per daughter, pt does not use an assistive device.  Daughter lives with pt        OT Problem List: Decreased strength;Decreased activity tolerance;Impaired balance (sitting and/or standing);Decreased cognition;Decreased safety awareness      OT Treatment/Interventions: Self-care/ADL training;DME and/or AE instruction;Patient/family education;Balance training;Cognitive  remediation/compensation;Therapeutic activities    OT Goals(Current goals can be found in the care plan section) Acute Rehab OT Goals Patient Stated Goal: home per daughter OT Goal Formulation: With family Time For Goal Achievement: 04/17/17 Potential to Achieve Goals: Good ADL Goals Pt Will Transfer to Toilet: with supervision;ambulating;bedside commode Additional ADL Goal #1: pt will complete adl with supervision and cues for thoroughness  OT Frequency: Min 2X/week   Barriers to D/C:            Co-evaluation PT/OT/SLP Co-Evaluation/Treatment: Yes Reason for Co-Treatment: For patient/therapist safety PT goals addressed during session: Mobility/safety with mobility OT goals addressed during session: ADL's and self-care      AM-PAC PT "6 Clicks" Daily Activity     Outcome Measure Help from another person eating meals?: A Little Help from another person taking care of personal grooming?: A Little Help from another person toileting, which includes using toliet, bedpan, or urinal?: A Little Help from another person bathing (including washing, rinsing, drying)?: A Little Help from another person to put on and taking off regular upper body clothing?: A Little Help from another person to put on and taking off regular lower body clothing?: A Little 6 Click Score: 18   End of Session    Activity Tolerance: Patient tolerated treatment well Patient left: in bed;with call bell/phone within reach;with family/visitor present (telesitter)  OT Visit Diagnosis: Unsteadiness on feet (R26.81)                Time: 8295-6213 OT Time Calculation (min): 23 min Charges:  OT General Charges $OT Visit: 1 Visit OT Evaluation $OT Eval Low Complexity: 1 Low G-Codes:     Cannon Ball, OTR/L [phone] 04/10/2017  Heather Floyd 04/10/2017, 12:39 PM

## 2017-04-10 NOTE — Evaluation (Signed)
Physical Therapy Evaluation Patient Details Name: Heather Floyd MRN: [id number] DOB: 05/22/1933 Today's Date: 04/10/2017   History of Present Illness  83 yo female admitted with enteritis, hypokalemia. Hx of TIA, dementia,   Clinical Impression  On eval, pt required Min assist +2 safety. She walked ~100 feet with 2 HHA. Pt participated fairly well. She was fairly easy to redirect when distracted. Daughter present during eval. Discussed d/c plan-pt will return home with daughter. Will follow and progress activity as tolerated.     Follow Up Recommendations No PT follow up;Supervision/Assistance - 24 hour    Equipment Recommendations  None recommended by PT    Recommendations for Other Services       Precautions / Restrictions Precautions Precautions: Fall; Enteric Restrictions Weight Bearing Restrictions: No      Mobility  Bed Mobility Overal bed mobility: Needs Assistance Bed Mobility: Supine to Sit;Sit to Supine     Supine to sit: HOB elevated;Min assist Sit to supine: Supervision;HOB elevated   General bed mobility comments: Multimodal cues for initiation and completion of task. Increased time. assist given to get to EOB to encourage participation.   Transfers Overall transfer level: Needs assistance Equipment used: 2 person hand held assist Transfers: Sit to/from Stand Sit to Stand: Min assist;+2 safety/equipment         General transfer comment: Assist to rise, stabilize, control descent.   Ambulation/Gait Ambulation/Gait assistance: Min assist;+2 safety/equipment Ambulation Distance (Feet): 100 Feet Assistive device: 2 person hand held assist Gait Pattern/deviations: Step-through pattern;Decreased stride length     General Gait Details: Assist to steady throughout ambulation. Slow gait speed.  Stairs            Wheelchair Mobility    Modified Rankin (Stroke Patients Only)       Balance Overall balance assessment: Needs assistance           Standing balance-Leahy Scale: Fair                               Pertinent Vitals/Pain Pain Assessment: No/denies pain    Home Living Family/patient expects to be discharged to:: Private residence Living Arrangements: Children Available Help at Discharge: Family;Available 24 hours/day Type of Home: House Home Access: Stairs to enter Entrance Stairs-Rails: None Entrance Stairs-Number of Steps: 2 Home Layout: One level   Additional Comments: information retrieved from prior admissions    Prior Function Level of Independence: Independent         Comments: (P) per daughter, pt does not use an assistive device.  Daughter lives with pt     Hand Dominance        Extremity/Trunk Assessment   Upper Extremity Assessment Upper Extremity Assessment: (P) Overall WFL for tasks assessed    Lower Extremity Assessment Lower Extremity Assessment: Generalized weakness    Cervical / Trunk Assessment Cervical / Trunk Assessment: Kyphotic  Communication   Communication: No difficulties  Cognition Arousal/Alertness: Awake/alert Behavior During Therapy: WFL for tasks assessed/performed Overall Cognitive Status: History of cognitive impairments - at baseline                                 General Comments: pt is talkative and easily distracted at times      General Comments      Exercises     Assessment/Plan    PT Assessment Patient needs continued PT services  PT Problem List Decreased mobility;Decreased balance;Decreased cognition       PT Treatment Interventions Gait training;Therapeutic exercise;Patient/family education;Balance training;Functional mobility training    PT Goals (Current goals can be found in the Care Plan section)  Acute Rehab PT Goals Patient Stated Goal: home per daughter PT Goal Formulation: With family Time For Goal Achievement: 04/24/17 Potential to Achieve Goals: Good    Frequency Min 3X/week   Barriers to  discharge        Co-evaluation   Reason for Co-Treatment: (P) For patient/therapist safety PT goals addressed during session: (P) Mobility/safety with mobility OT goals addressed during session: (P) ADL's and self-care       AM-PAC PT "6 Clicks" Daily Activity  Outcome Measure Difficulty turning over in bed (including adjusting bedclothes, sheets and blankets)?: Unable Difficulty moving from lying on back to sitting on the side of the bed? : Unable Difficulty sitting down on and standing up from a chair with arms (e.g., wheelchair, bedside commode, etc,.)?: Unable Help needed moving to and from a bed to chair (including a wheelchair)?: A Little Help needed walking in hospital room?: A Little Help needed climbing 3-5 steps with a railing? : A Little 6 Click Score: 12    End of Session Equipment Utilized During Treatment: Gait belt Activity Tolerance: Patient tolerated treatment well Patient left: in bed;with call bell/phone within reach;with family/visitor present;with bed alarm set (telesitter)   PT Visit Diagnosis: Difficulty in walking, not elsewhere classified (R26.2)    Time: 1028-1050 PT Time Calculation (min) (ACUTE ONLY): 22 min   Charges:   PT Evaluation $PT Eval Low Complexity: 1 Low     PT G Codes:          Rebeca Alert, MPT Pager: [phone]

## 2017-04-11 DIAGNOSIS — K529 Noninfective gastroenteritis and colitis, unspecified: Secondary | ICD-10-CM

## 2017-04-11 LAB — GASTROINTESTINAL PANEL BY PCR, STOOL (REPLACES STOOL CULTURE)

## 2017-04-11 LAB — BASIC METABOLIC PANEL
ANION GAP: 9 (ref 5–15)
BUN: <5 mg/dL — ABNORMAL LOW (ref 6–20)
CALCIUM: 8.7 mg/dL — AB (ref 8.9–10.3)
CO2: 26 mmol/L (ref 22–32)
Chloride: 108 mmol/L (ref 101–111)
Creatinine, Ser: 0.63 mg/dL (ref 0.44–1.00)
GFR calc Af Amer: >60 mL/min (ref >60)
GFR calc non Af Amer: >60 mL/min (ref >60)
GLUCOSE: 98 mg/dL (ref 65–99)
Potassium: 3.3 mmol/L — ABNORMAL LOW (ref 3.5–5.1)
Sodium: 143 mmol/L (ref 135–145)

## 2017-04-11 LAB — MAGNESIUM: Magnesium: 1.8 mg/dL (ref 1.7–2.4)

## 2017-04-11 MED ORDER — POTASSIUM CHLORIDE 10 MEQ/100ML IV SOLN
10.0000 meq | INTRAVENOUS | Status: AC
  Administered 2017-04-11 (×3): 10 meq via INTRAVENOUS
  Filled 2017-04-11 (×3): qty 100

## 2017-04-11 NOTE — Progress Notes (Signed)
Patient ID: Heather Floyd, female   DOB: [birth year], 83 y.o.   MRN: [id number]  PROGRESS NOTE    Heather Floyd  Heather Floyd DOB: May 14, 1933 DOA: 04/07/2017  PCP: Daisy Floro, MD   Brief Narrative:  83 year old female with history of dementia, hypertension, dyslipidemia and RLS who presented to ED with abdominal pain for past 4 days prior to the admission. Pt did not tolerate much PO intake at home and has had vomiting even after small amount of food. She also had abdominal bloating, distention.   Assessment & Plan:   Principal Problem:   Enteritis / abdominal pain nausea and vomiting  - CT scan showed signs of enteritis and possibly early SBO - Continue Rocephin and flagyl - GI pathogen panel pending - Blood cx negative - General surgery signed off 10/25, no acute surgical issues identified   Active Problems:   Hypokalemia - Due to GI losses - Supplemented - Follow up BMP in am    Hypertension, essential - Continue amlodipine and lisinopril     Dementia with behavioral disturbance  - Continue haldol and risperidone - May use ativan PRN    DVT prophylaxis: Lovenox subQ Code Status: full code  Family Communication: care-giver at the bedside, talked to her daughter over the phone this am Disposition Plan: home once she feels better    Consultants:   PT  Procedures:   None   Antimicrobials:   Rocephin and flagyl -->   Subjective: No overnight events.   Objective: Vitals:   04/10/17 1419 04/10/17 1453 04/10/17 2100 04/11/17 0719  BP: (!) 190/100 (!) 152/79 (!) 175/89 (!) 186/114  Pulse: 93  96 83  Resp: 18  18 18   Temp: 97.6 F (36.4 C)  99.2 F (37.3 C) 98.7 F (37.1 C)  TempSrc: Oral  Oral Oral  SpO2: 94%  94% 93%  Weight:      Height:        Intake/Output Summary (Last 24 hours) at 04/11/17 0909 Last data filed at 04/11/17 0700  Gross per 24 hour  Intake             1140 ml  Output              800 ml  Net              340 ml    Filed Weights   04/07/17 1653  Weight: 56.7 kg (125 lb)    Examination:  General exam: Appears calm and comfortable  Respiratory system: Clear to auscultation. Respiratory effort normal. Cardiovascular system: S1 & S2 heard, Rate controlled  Gastrointestinal system: Abdomen is nondistended, soft and nontender. No organomegaly or masses felt. Normal bowel sounds heard. Central nervous system: Alert. No focal neurological deficits. Extremities: Symmetric 5 x 5 power. No swelling  Skin: No rashes, lesions or ulcers Psychiatry: Mood & affect appropriate.   Data Reviewed: I have personally reviewed following labs and imaging studies  CBC:  Recent Labs Lab 04/06/17 2027 04/07/17 1726 04/08/17 0604 04/09/17 0541  WBC 12.8* 10.7* 9.8 6.1  NEUTROABS  --  8.4*  --   --   HGB 14.9 15.1* 12.7 11.3*  HCT 44.4 43.5 37.3 33.3*  MCV 86.9 86.3 86.9 86.3  PLT 304 288 214 199   Basic Metabolic Panel:  Recent Labs Lab 04/07/17 1726 04/08/17 0604 04/09/17 0541 04/10/17 0539 04/11/17 0558  NA 139 138 143 142 143  K 3.3* 3.8 3.1* 2.8* 3.3*  CL 97*  107 108 105 108  CO2 27 23 27 26 26   GLUCOSE 128* 97 91 97 98  BUN 21* 14 8 <5* <5*  CREATININE 0.96 0.81 0.68 0.60 0.63  CALCIUM 9.8 8.3* 8.3* 8.5* 8.7*  MG  --   --  1.5* 2.2 1.8   GFR: Estimated Creatinine Clearance: 42.9 mL/min (by C-G formula based on SCr of 0.63 mg/dL). Liver Function Tests:  Recent Labs Lab 04/06/17 2027 04/07/17 1726 04/08/17 0604  AST 27 28  --   ALT 20 19  --   ALKPHOS 57 59  --   BILITOT 0.8 0.9  --   PROT 7.4 7.3  --   ALBUMIN 4.1 4.0 3.1*    Recent Labs Lab 04/06/17 2027 04/07/17 1726  LIPASE 41 36   No results for input(s): AMMONIA in the last 168 hours. Coagulation Profile: No results for input(s): INR, PROTIME in the last 168 hours. Cardiac Enzymes: No results for input(s): CKTOTAL, CKMB, CKMBINDEX, TROPONINI in the last 168 hours. BNP (last 3 results) No results for input(s):  PROBNP in the last 8760 hours. HbA1C: No results for input(s): HGBA1C in the last 72 hours. CBG: No results for input(s): GLUCAP in the last 168 hours. Lipid Profile: No results for input(s): CHOL, HDL, LDLCALC, TRIG, CHOLHDL, LDLDIRECT in the last 72 hours. Thyroid Function Tests: No results for input(s): TSH, T4TOTAL, FREET4, T3FREE, THYROIDAB in the last 72 hours. Anemia Panel: No results for input(s): VITAMINB12, FOLATE, FERRITIN, TIBC, IRON, RETICCTPCT in the last 72 hours. Urine analysis:    Component Value Date/Time   COLORURINE YELLOW 04/07/2017 1800   APPEARANCEUR TURBID (A) 04/07/2017 1800   LABSPEC 1.025 04/07/2017 1800   PHURINE 5.0 04/07/2017 1800   GLUCOSEU NEGATIVE 04/07/2017 1800   HGBUR NEGATIVE 04/07/2017 1800   BILIRUBINUR NEGATIVE 04/07/2017 1800   KETONESUR NEGATIVE 04/07/2017 1800   PROTEINUR NEGATIVE 04/07/2017 1800   UROBILINOGEN 1.0 09/30/2014 1211   NITRITE NEGATIVE 04/07/2017 1800   LEUKOCYTESUR SMALL (A) 04/07/2017 1800   Sepsis Labs: @LABRCNTIP (procalcitonin:4,lacticidven:4)   Recent Results (from the past 240 hour(s))  Urine culture     Status: Abnormal   Collection Time: 04/07/17 10:00 PM  Result Value Ref Range Status   Specimen Description URINE, RANDOM  Final   Special Requests NONE  Final   Culture MULTIPLE SPECIES PRESENT, SUGGEST RECOLLECTION (A)  Final   Report Status 04/09/2017 FINAL  Final  Blood culture (routine x 2)     Status: None (Preliminary result)   Collection Time: 04/07/17 10:07 PM  Result Value Ref Range Status   Specimen Description BLOOD LEFT ANTECUBITAL  Final   Special Requests   Final    BOTTLES DRAWN AEROBIC AND ANAEROBIC Blood Culture adequate volume   Culture   Final    NO GROWTH 4 DAYS Performed at [facility], [address] [place], [place] [address]    Report Status PENDING  Incomplete  Blood culture (routine x 2)     Status: None (Preliminary result)   Collection Time: 04/07/17 10:09 PM  Result  Value Ref Range Status   Specimen Description BLOOD RIGHT ANTECUBITAL  Final   Special Requests   Final    BOTTLES DRAWN AEROBIC AND ANAEROBIC Blood Culture adequate volume   Culture   Final    NO GROWTH 4 DAYS Performed at [facility], [address], [place], [place] [address]    Report Status PENDING  Incomplete      Radiology Studies:  Dg Abd 1 View  Result Date: 04/08/2017 CLINICAL DATA:  Abdominal pain. EXAM: ABDOMEN - 1 VIEW COMPARISON:  CT abdomen pelvis 04/07/2017. FINDINGS: Mild residual small bowel dilatation. Scattered colonic gas and stool. No unexpected radiopaque calculi. Right hemidiaphragm is elevated. IMPRESSION: Mild residual small bowel dilatation. No definitive evidence of an obstruction. Electronically Signed   By: Leanna BattlesMelinda  Blietz M.D.   On: 04/08/2017 08:35   Ct Abdomen Pelvis W Contrast  Result Date: 04/07/2017 CLINICAL DATA:  83 year old presenting with a four-day history of bilious vomiting and abdominal distention. EXAM: CT ABDOMEN AND PELVIS WITH CONTRAST TECHNIQUE: Multidetector CT imaging of the abdomen and pelvis was performed using the standard protocol following bolus administration of intravenous contrast. CONTRAST:  100mL ISOVUE-300 IOPAMIDOL INJECTION 61% IV. COMPARISON:  CTA abdomen 06/22/2007. CT abdomen and pelvis 06/21/2007. FINDINGS: Lower chest: Mild dependent atelectasis posteriorly in the lower lobes. Visualized lung bases otherwise clear. Heart size upper normal. Hepatobiliary: Mild diffuse hepatic steatosis without significant focal hepatic parenchymal abnormality. Calcified granuloma in the right lobe of the liver. Solitary 9 mm calcified gallstone in the otherwise normal-appearing gallbladder. No biliary ductal dilation. Pancreas: Mild diffuse pancreatic atrophy. No pancreatic masses. No peripancreatic inflammation. Spleen: Normal in size and appearance. Adrenals/Urinary Tract: Normal appearing adrenal glands. Small exophytic cyst arising  from the lateral mid right kidney. No significant focal parenchymal abnormality involving either kidney. No hydronephrosis. No visible urinary tract calculi. Urinary bladder decompressed and unremarkable. Stomach/Bowel: Stomach normal in appearance for the degree of distention. Multiple dilated loops of jejunum and proximal ileum with mucosal enhancement. The mid and distal ileum are decompressed, but also demonstrate mucosal enhancement. There are scattered small bowel diverticula. Entire colon decompressed with scattered areas of mucosal enhancement. No visible colonic diverticula. Lipoma involving the ileocecal valve. Appendix not visualized, but no pericecal inflammation. Vascular/Lymphatic: Severe aortoiliofemoral atherosclerosis without evidence of aneurysm. Normal-appearing portal venous and systemic venous systems. Borderline portacaval and porta hepatis lymph nodes, unchanged since the 2009 CT. No new or significant lymphadenopathy in the abdomen or pelvis. Reproductive: Uterus surgically absent.  No adnexal masses. Other: Small amount of ascites in the right side of the low pelvis. Musculoskeletal: Osseous demineralization. Degenerative disc disease and disc extrusion at L5-S1 with a desiccated disc fragment behind the S1 vertebral body. Mild compression fracture involving the upper endplate of L2 on the order of 30% or so, new since 2009 but not felt to be acute. IMPRESSION: 1. Diffuse enteritis involving essentially the entire small bowel, with dilation of the jejunum and proximal ileum. The mid and distal ileum and the entire colon are decompressed, but I do not identify an obstructing small bowel mass or a definite adhesion to confirm a small bowel obstruction. 2. Small amount of dependent ascites in the right side of the pelvis. 3. Cholelithiasis without evidence of acute cholecystitis. 4. Diffuse hepatic steatosis without focal hepatic parenchymal abnormality. 5. Mild diffuse pancreatic atrophy. 6.   Aortic Atherosclerosis (ICD10-170.0) Electronically Signed   By: Hulan Saashomas  Lawrence M.D.   On: 04/07/2017 18:49        Scheduled Meds: . amLODipine  5 mg Oral Daily  . enoxaparin (LOVENOX) injection  40 mg Subcutaneous QHS  . lisinopril  40 mg Oral Daily  . polyethylene glycol  17 g Oral BID  . risperiDONE  0.5 mg Oral QHS  . senna  1 tablet Oral QHS   Continuous Infusions: . sodium chloride 75 mL/hr at 04/10/17 1742  . cefTRIAXone (ROCEPHIN)  IV Stopped (04/10/17 2241)  .  metronidazole Stopped (04/11/17 0417)     LOS: 3 days    Time spent: 25 minutes  Greater than 50% of the time spent on counseling and coordinating the care.   Manson Passey, MD [facility] Pager [phone]  If 7PM-7AM, please contact night-coverage [email or web address] Password [email or web address] 04/11/2017, 9:09 AM

## 2017-04-11 NOTE — Progress Notes (Signed)
Pt restless high fall risk, teley monitor at bedside. Ambulated Pt in hallway to help with relaxation, po and iv anxiety med's given, safe sitter at bedside, po's offered, toileting  completed . Order received for safe belt at this time.

## 2017-04-12 LAB — CBC
HEMATOCRIT: 41.8 % (ref 36.0–46.0)
HEMOGLOBIN: 14.7 g/dL (ref 12.0–15.0)
MCH: 29.9 pg (ref 26.0–34.0)
MCHC: 35.2 g/dL (ref 30.0–36.0)
MCV: 85 fL (ref 78.0–100.0)
Platelets: 288 10*3/uL (ref 150–400)
RBC: 4.92 MIL/uL (ref 3.87–5.11)
RDW: 13.9 % (ref 11.5–15.5)
WBC: 7.5 10*3/uL (ref 4.0–10.5)

## 2017-04-12 LAB — BASIC METABOLIC PANEL
ANION GAP: 10 (ref 5–15)
BUN: <5 mg/dL — ABNORMAL LOW (ref 6–20)
CHLORIDE: 106 mmol/L (ref 101–111)
CO2: 26 mmol/L (ref 22–32)
Calcium: 9.4 mg/dL (ref 8.9–10.3)
Creatinine, Ser: 0.61 mg/dL (ref 0.44–1.00)
GFR calc Af Amer: >60 mL/min (ref >60)
GLUCOSE: 127 mg/dL — AB (ref 65–99)
POTASSIUM: 3.2 mmol/L — AB (ref 3.5–5.1)
Sodium: 142 mmol/L (ref 135–145)

## 2017-04-12 LAB — CULTURE, BLOOD (ROUTINE X 2)
Culture: NO GROWTH
Culture: NO GROWTH
Special Requests: ADEQUATE
Special Requests: ADEQUATE

## 2017-04-12 MED ORDER — METRONIDAZOLE 50 MG/ML ORAL SUSPENSION
500.0000 mg | Freq: Three times a day (TID) | ORAL | 0 refills | Status: DC
Start: 2017-04-12 — End: 2017-08-27

## 2017-04-12 MED ORDER — CIPROFLOXACIN 500 MG/5ML (10%) PO SUSR: 500.0000 mg | Freq: Two times a day (BID) | ORAL | 0 refills | Status: DC

## 2017-04-12 MED ORDER — POTASSIUM CHLORIDE 10 MEQ/100ML IV SOLN
10.0000 meq | INTRAVENOUS | Status: AC
  Filled 2017-04-12 (×3): qty 100

## 2017-04-12 NOTE — Discharge Instructions (Signed)
Ciprofloxacin oral suspension What is this medicine? CIPROFLOXACIN (sip roe FLOX a sin) is a quinolone antibiotic. It is used to treat certain kinds of bacterial infections. It will not work for colds, flu, or other viral infections. This medicine may be used for other purposes; ask your health care provider or pharmacist if you have questions. COMMON BRAND NAME(S): Cipro What should I tell my health care provider before I take this medicine? They need to know if you have any of these conditions: -bone problems -history of low levels of potassium in the blood -irregular heartbeat -joint problems -kidney disease -myasthenia gravis -seizures -tendon problems -tingling of the fingers or toes, or other nerve disorder -an unusual or allergic reaction to ciprofloxacin, other antibiotics or medicines, foods, dyes, or preservatives -pregnant or trying to get pregnant -breast-feeding How should I use this medicine? Take this medicine by mouth. Follow the directions on the prescription label. Shake well for 15 seconds before using. Use a specially marked spoon or dropper to measure every dose. Ask your pharmacist if you don't have one. Household spoons are not accurate. Take your medicine at regular intervals. Bottles of suspension may contain more liquid than you need to take. Follow your doctors instructions about how much to take and for how many days to take it. Do not take more medicine than directed. But, finish all the medicine that is prescribed even if you think you are better. You can take this medicine with food or on an empty stomach. It can be taken with a meal that contains dairy or calcium, but do not take it alone with a dairy product, like milk or yogurt or calcium-fortified juice. A special MedGuide will be given to you by the pharmacist with each prescription and refill. Be sure to read this information carefully each time. Talk to your pediatrician regarding the use of this medicine  in children. Special care may be needed. Overdosage: If you think you have taken too much of this medicine contact a poison control center or emergency room at once. NOTE: This medicine is only for you. Do not share this medicine with others. What if I miss a dose? If you miss a dose, take it as soon as you can. If it is almost time for your next dose, take only that dose. Do not take double or extra doses. What may interact with this medicine? Do not take this medicine with any of the following medications: -cisapride -dofetilide -dronedarone -flibanserin -lomitapide -pimozide -thioridazine -tizanidine -ziprasidone This medicine may also interact with the following medications: -antacids -birth control pills -caffeine -certain medicines for diabetes, like glipizide or glyburide -certain medicines that treat or prevent blood clots like warfarin -clozapine -cyclosporine -didanosine (ddI) buffered tablets or powder -duloxetine -lanthanum carbonate -lidocaine -methotrexate -multivitamins -NSAIDS, medicines for pain and inflammation, like ibuprofen or naproxen -olanzapine -omeprazole -other medicines that prolong the QT interval (cause an abnormal heart rhythm) -phenytoin -probenecid -ropinirole -sevelamer -sildenafil -sucralfate -theophylline -zolpidem This list may not describe all possible interactions. Give your health care provider a list of all the medicines, herbs, non-prescription drugs, or dietary supplements you use. Also tell them if you smoke, drink alcohol, or use illegal drugs. Some items may interact with your medicine. What should I watch for while using this medicine? Tell your doctor or health care professional if your symptoms do not improve. Do not treat diarrhea with over the counter products. Contact your doctor if you have diarrhea that lasts more than 2 days or if it  is severe and watery. You may get drowsy or dizzy. Do not drive, use machinery, or do  anything that needs mental alertness until you know how this medicine affects you. Do not stand or sit up quickly, especially if you are an older patient. This reduces the risk of dizzy or fainting spells. This medicine can make you more sensitive to the sun. Keep out of the sun. If you cannot avoid being in the sun, wear protective clothing and use sunscreen. Do not use sun lamps or tanning beds/booths. Avoid antacids, aluminum, calcium, iron, magnesium, and zinc products for 6 hours before and 2 hours after taking a dose of this medicine. What side effects may I notice from receiving this medicine? Side effects that you should report to your doctor or health care professional as soon as possible: -allergic reactions like skin rash or hives, swelling of the face, lips, or tongue -anxious -confusion -depressed mood -diarrhea -fast, irregular heartbeat -hallucination, loss of contact with reality -joint, muscle, or tendon pain or swelling -pain, tingling, numbness in the hands or feet -suicidal thoughts or other mood changes -sunburn -unusually weak or tired Side effects that usually do not require medical attention (report to your doctor or health care professional if they continue or are bothersome): -dry mouth -headache -nausea -trouble sleeping This list may not describe all possible side effects. Call your doctor for medical advice about side effects. You may report side effects to FDA at 1-800-FDA-1088. Where should I keep my medicine? Keep out of the reach of children. Store at room temperature below 30 degrees C (86 degrees F). Do not freeze. Keep container tightly closed. Throw away any unused medicine 14 days after the mix date. NOTE: This sheet is a summary. It may not cover all possible information. If you have questions about this medicine, talk to your doctor, pharmacist, or health care provider.  2018 Elsevier/Gold Standard (2016-01-11 14:35:44)

## 2017-04-12 NOTE — Progress Notes (Signed)
Discharge instructions reviewed with patients daughter Bonita QuinLinda (Melina Modenapoa)  utilizing teach back method. Patient discharged to home

## 2017-04-12 NOTE — Discharge Summary (Addendum)
Physician Discharge Summary  Heather Floyd ZOX:[id number] DOB: 06-03-1933 DOA: 04/07/2017  PCP: Daisy Floro, MD  Admit date: 04/07/2017 Discharge date: 04/12/2017  Recommendations for Outpatient Follow-up:  Continue cipro and flagyl for 5 days on discharge.  Discharge Diagnoses:  Principal Problem:   Enteritis Active Problems:   Hypertension   Essential hypertension   Abdominal pain   Leukocytosis   Dementia   Dehydration   Abnormal urinalysis    Discharge Condition: stable   Diet recommendation: as tolerated   History of present illness:  83 year old female with history of dementia, hypertension, dyslipidemia and RLS who presented to ED with abdominal pain for past 4 days prior to the admission. Pt did not tolerate much PO intake at home and has had vomiting even after small amount of food. She also had abdominal bloating, distention.  Hospital Course:   Principal Problem:   Enteritis, infectious / abdominal pain nausea and vomiting  - CT scan showed signs of enteritis but SBO ruled out, no definite sign of SBO on CT scan - GI pathogen panel negative - Blood cx negative - Surgery signed off 10/25 - Continue flagyl and cipro for 5 days on discharge   Active Problems:   Hypokalemia - Secondary to GI losses - Supplemented  - Magnesium WNL    Hypertension, essential - Continue Norvasc and lisinopril     Dementia with behavioral disturbance  - Continue home meds     DVT prophylaxis: Lovenox subQ Code Status: full code  Family Communication: spoke with daughter today, she wanted that her mother be discharged today, potassium can be rechecked by [facility] RN    Consultants:   PT - no follow up required   Procedures:   None  Antimicrobials:   Rocephin and flagyl --> for 5 more days on discharged   Signed:  Manson Passey, MD  [facility] 04/12/2017, 12:01 PM  Pager #: [phone]  Time spent in minutes: more than 30  minutes  Discharge Exam: Vitals:   04/11/17 2205 04/12/17 1116  BP: (!) 185/118 (!) 161/78  Pulse: (!) 118 96  Resp: 20 16  Temp: 97.6 F (36.4 C) 97.9 F (36.6 C)  SpO2: 93% 92%   Vitals:   04/11/17 1055 04/11/17 1300 04/11/17 2205 04/12/17 1116  BP: (!) 172/94 (!) 144/68 (!) 185/118 (!) 161/78  Pulse: 83 77 (!) 118 96  Resp:  20 20 16   Temp:  98 F (36.7 C) 97.6 F (36.4 C) 97.9 F (36.6 C)  TempSrc:  Oral Axillary Axillary  SpO2: 93% 95% 93% 92%  Weight:      Height:        General: Pt is alert, follows commands appropriately, not in acute distress Cardiovascular: Rate controlled, S1/S2 + Respiratory: Clear to auscultation bilaterally, no wheezing, no crackles, no rhonchi Abdominal: Soft, non tender, non distended, bowel sounds +, no guarding Extremities: no edema, no cyanosis, pulses palpable bilaterally DP and PT Neuro: Grossly nonfocal  Discharge Instructions  Discharge Instructions    Call MD for:  persistant nausea and vomiting    Complete by:  As directed    Call MD for:  redness, tenderness, or signs of infection (pain, swelling, redness, odor or green/yellow discharge around incision site)    Complete by:  As directed    Call MD for:  severe uncontrolled pain    Complete by:  As directed    Diet - low sodium heart healthy    Complete by:  As directed    Discharge instructions    Complete by:  As directed    Continue cipro and flagyl for 5 days on discharge.   Increase activity slowly    Complete by:  As directed      Allergies as of 04/12/2017      Reactions   Aspirin Other (See Comments)   Makes blood too thin   Codeine Nausea Only      Medication List    TAKE these medications   amLODipine 5 MG tablet Commonly known as:  NORVASC Take 1 tablet (5 mg total) by mouth daily.   bisacodyl 5 MG EC tablet Commonly known as:  DULCOLAX Take 5 mg by mouth 2 (two) times a week.   ciprofloxacin 500 MG/5ML (10%) suspension Commonly known as:   CIPRO Take 5 mLs (500 mg total) by mouth 2 (two) times daily.   hydrOXYzine 25 MG tablet Commonly known as:  ATARAX/VISTARIL Take 1 tablet (25 mg total) by mouth 3 (three) times daily as needed. What changed:  reasons to take this   lisinopril 40 MG tablet Commonly known as:  PRINIVIL,ZESTRIL Take 40 mg by mouth daily.   memantine 28 MG Cp24 24 hr capsule Commonly known as:  NAMENDA XR TAKE ONE CAPSULE BY MOUTH ONCE DAILY   metroNIDAZOLE 50 mg/ml oral suspension Commonly known as:  FLAGYL Take 10 mLs (500 mg total) by mouth 3 (three) times daily.   multivitamin tablet Take 1 tablet by mouth daily.   potassium chloride SA 20 MEQ tablet Commonly known as:  K-DUR,KLOR-CON Take 20 mEq by mouth 2 (two) times daily.   risperiDONE 0.5 MG tablet Commonly known as:  RISPERDAL TAKE ONE TABLET BY MOUTH AT BEDTIME What changed:  See the new instructions.   rosuvastatin 20 MG tablet Commonly known as:  CRESTOR Take 20 mg by mouth daily.   sertraline 100 MG tablet Commonly known as:  ZOLOFT TAKE ONE TABLET BY MOUTH ONCE DAILY   traZODone 50 MG tablet Commonly known as:  DESYREL Take 1 tablet (50 mg total) by mouth at bedtime.   vitamin B-12 100 MCG tablet Commonly known as:  CYANOCOBALAMIN Take 100 mcg by mouth daily.   Vitamin D 2000 units Caps Take 2,000 Units by mouth daily.      Follow-up Information    Daisy Floro, MD. Schedule an appointment as soon as possible for a visit.   Specialty:  Family Medicine Contact information: [address] [place] [address] [phone]            The results of significant diagnostics from this hospitalization (including imaging, microbiology, ancillary and laboratory) are listed below for reference.    Significant Diagnostic Studies: Dg Chest 2 View  Result Date: 03/16/2017 CLINICAL DATA:  Possible pleural effusion. EXAM: CHEST  2 VIEW COMPARISON:  Chest x-ray from same date. FINDINGS: Borderline  enlarged cardiomediastinal silhouette. Atherosclerotic calcification of the aortic arch. Normal pulmonary vascularity. Left basilar atelectasis. No focal consolidation, pleural effusion, or pneumothorax. No acute osseous abnormality. IMPRESSION: No active cardiopulmonary disease. Electronically Signed   By: Obie Dredge M.D.   On: 03/16/2017 11:48   Dg Abd 1 View  Result Date: 04/08/2017 CLINICAL DATA:  Abdominal pain. EXAM: ABDOMEN - 1 VIEW COMPARISON:  CT abdomen pelvis 04/07/2017. FINDINGS: Mild residual small bowel dilatation. Scattered colonic gas and stool. No unexpected radiopaque calculi. Right hemidiaphragm is elevated. IMPRESSION: Mild residual small bowel dilatation. No definitive evidence of an obstruction. Electronically Signed  By: Leanna BattlesMelinda  Blietz M.D.   On: 04/08/2017 08:35   Ct Head Wo Contrast  Result Date: 03/16/2017 CLINICAL DATA:  Altered level of consciousness. History of dementia. EXAM: CT HEAD WITHOUT CONTRAST TECHNIQUE: Contiguous axial images were obtained from the base of the skull through the vertex without intravenous contrast. COMPARISON:  Head CT 12/11/2015 FINDINGS: Brain: Stable age related cerebral atrophy, ventriculomegaly and periventricular white matter disease. No extra-axial fluid collections are identified. No CT findings for acute hemispheric infarction or intracranial hemorrhage. No mass lesions. The brainstem and cerebellum are normal. Vascular: No hyperdense vessels or definite aneurysm. Stable vascular calcifications. Skull: No skull fracture or bone lesion. Sinuses/Orbits: The paranasal sinuses and mastoid air cells are clear. The globes are intact. Other: No scalp lesions or hematoma. IMPRESSION: 1. Stable age related cerebral atrophy, ventriculomegaly and periventricular white matter disease. 2. No acute intracranial findings or skull fracture. Electronically Signed   By: Rudie MeyerP.  Gallerani M.D.   On: 03/16/2017 11:30   Ct Abdomen Pelvis W Contrast  Result  Date: 04/07/2017 CLINICAL DATA:  83 year old presenting with a four-day history of bilious vomiting and abdominal distention. EXAM: CT ABDOMEN AND PELVIS WITH CONTRAST TECHNIQUE: Multidetector CT imaging of the abdomen and pelvis was performed using the standard protocol following bolus administration of intravenous contrast. CONTRAST:  100mL ISOVUE-300 IOPAMIDOL INJECTION 61% IV. COMPARISON:  CTA abdomen 06/22/2007. CT abdomen and pelvis 06/21/2007. FINDINGS: Lower chest: Mild dependent atelectasis posteriorly in the lower lobes. Visualized lung bases otherwise clear. Heart size upper normal. Hepatobiliary: Mild diffuse hepatic steatosis without significant focal hepatic parenchymal abnormality. Calcified granuloma in the right lobe of the liver. Solitary 9 mm calcified gallstone in the otherwise normal-appearing gallbladder. No biliary ductal dilation. Pancreas: Mild diffuse pancreatic atrophy. No pancreatic masses. No peripancreatic inflammation. Spleen: Normal in size and appearance. Adrenals/Urinary Tract: Normal appearing adrenal glands. Small exophytic cyst arising from the lateral mid right kidney. No significant focal parenchymal abnormality involving either kidney. No hydronephrosis. No visible urinary tract calculi. Urinary bladder decompressed and unremarkable. Stomach/Bowel: Stomach normal in appearance for the degree of distention. Multiple dilated loops of jejunum and proximal ileum with mucosal enhancement. The mid and distal ileum are decompressed, but also demonstrate mucosal enhancement. There are scattered small bowel diverticula. Entire colon decompressed with scattered areas of mucosal enhancement. No visible colonic diverticula. Lipoma involving the ileocecal valve. Appendix not visualized, but no pericecal inflammation. Vascular/Lymphatic: Severe aortoiliofemoral atherosclerosis without evidence of aneurysm. Normal-appearing portal venous and systemic venous systems. Borderline portacaval  and porta hepatis lymph nodes, unchanged since the 2009 CT. No new or significant lymphadenopathy in the abdomen or pelvis. Reproductive: Uterus surgically absent.  No adnexal masses. Other: Small amount of ascites in the right side of the low pelvis. Musculoskeletal: Osseous demineralization. Degenerative disc disease and disc extrusion at L5-S1 with a desiccated disc fragment behind the S1 vertebral body. Mild compression fracture involving the upper endplate of L2 on the order of 30% or so, new since 2009 but not felt to be acute. IMPRESSION: 1. Diffuse enteritis involving essentially the entire small bowel, with dilation of the jejunum and proximal ileum. The mid and distal ileum and the entire colon are decompressed, but I do not identify an obstructing small bowel mass or a definite adhesion to confirm a small bowel obstruction. 2. Small amount of dependent ascites in the right side of the pelvis. 3. Cholelithiasis without evidence of acute cholecystitis. 4. Diffuse hepatic steatosis without focal hepatic parenchymal abnormality. 5. Mild diffuse pancreatic atrophy.  6.  Aortic Atherosclerosis (ICD10-170.0) Electronically Signed   By: Hulan Saas M.D.   On: 04/07/2017 18:49   Dg Chest Port 1 View  Result Date: 03/16/2017 CLINICAL DATA:  Altered level of consciousness.  Hypertension. EXAM: PORTABLE CHEST 1 VIEW COMPARISON:  December 13, 2015 FINDINGS: There is atelectatic change in the left base with small left pleural effusion. There is also mild right base atelectasis. Lungs elsewhere clear. Heart is upper normal in size with pulmonary vascularity within normal limits. No adenopathy. There is aortic atherosclerosis. No evident bone lesions. IMPRESSION: Small left pleural effusion with bibasilar atelectasis. No consolidation. Stable cardiac silhouette. There is aortic atherosclerosis. Aortic Atherosclerosis (ICD10-I70.0). Electronically Signed   By: Bretta Bang III M.D.   On: 03/16/2017 09:26     Microbiology: Recent Results (from the past 240 hour(s))  Urine culture     Status: Abnormal   Collection Time: 04/07/17 10:00 PM  Result Value Ref Range Status   Specimen Description URINE, RANDOM  Final   Special Requests NONE  Final   Culture MULTIPLE SPECIES PRESENT, SUGGEST RECOLLECTION (A)  Final   Report Status 04/09/2017 FINAL  Final  Blood culture (routine x 2)     Status: None (Preliminary result)   Collection Time: 04/07/17 10:07 PM  Result Value Ref Range Status   Specimen Description BLOOD LEFT ANTECUBITAL  Final   Special Requests   Final    BOTTLES DRAWN AEROBIC AND ANAEROBIC Blood Culture adequate volume   Culture   Final    NO GROWTH 4 DAYS Performed at [facility], [address], [place], [place] [address]    Report Status PENDING  Incomplete  Blood culture (routine x 2)     Status: None (Preliminary result)   Collection Time: 04/07/17 10:09 PM  Result Value Ref Range Status   Specimen Description BLOOD RIGHT ANTECUBITAL  Final   Special Requests   Final    BOTTLES DRAWN AEROBIC AND ANAEROBIC Blood Culture adequate volume   Culture   Final    NO GROWTH 4 DAYS Performed at [facility], [address] [place], [place] [address]    Report Status PENDING  Incomplete  Gastrointestinal Panel by PCR , Stool     Status: None   Collection Time: 04/10/17  4:38 PM  Result Value Ref Range Status   Campylobacter species NOT DETECTED NOT DETECTED Final   Plesimonas shigelloides NOT DETECTED NOT DETECTED Final   Salmonella species NOT DETECTED NOT DETECTED Final   Yersinia enterocolitica NOT DETECTED NOT DETECTED Final   Vibrio species NOT DETECTED NOT DETECTED Final   Vibrio cholerae NOT DETECTED NOT DETECTED Final   Enteroaggregative E coli (EAEC) NOT DETECTED NOT DETECTED Final   Enteropathogenic E coli (EPEC) NOT DETECTED NOT DETECTED Final   Enterotoxigenic E coli (ETEC) NOT DETECTED NOT DETECTED Final   Shiga like toxin producing E coli (STEC)  NOT DETECTED NOT DETECTED Final   Shigella/Enteroinvasive E coli (EIEC) NOT DETECTED NOT DETECTED Final   Cryptosporidium NOT DETECTED NOT DETECTED Final   Cyclospora cayetanensis NOT DETECTED NOT DETECTED Final   Entamoeba histolytica NOT DETECTED NOT DETECTED Final   Giardia lamblia NOT DETECTED NOT DETECTED Final   Adenovirus F40/41 NOT DETECTED NOT DETECTED Final   Astrovirus NOT DETECTED NOT DETECTED Final   Norovirus GI/GII NOT DETECTED NOT DETECTED Final   Rotavirus A NOT DETECTED NOT DETECTED Final   Sapovirus (I, II, IV, and V) NOT DETECTED NOT DETECTED Final  Labs: Basic Metabolic Panel:  Recent Labs Lab 04/08/17 0604 04/09/17 0541 04/10/17 0539 04/11/17 0558 04/12/17 0553  NA 138 143 142 143 142  K 3.8 3.1* 2.8* 3.3* 3.2*  CL 107 108 105 108 106  CO2 23 27 26 26 26   GLUCOSE 97 91 97 98 127*  BUN 14 8 <5* <5* <5*  CREATININE 0.81 0.68 0.60 0.63 0.61  CALCIUM 8.3* 8.3* 8.5* 8.7* 9.4  MG  --  1.5* 2.2 1.8  --    Liver Function Tests:  Recent Labs Lab 04/06/17 2027 04/07/17 1726 04/08/17 0604  AST 27 28  --   ALT 20 19  --   ALKPHOS 57 59  --   BILITOT 0.8 0.9  --   PROT 7.4 7.3  --   ALBUMIN 4.1 4.0 3.1*    Recent Labs Lab 04/06/17 2027 04/07/17 1726  LIPASE 41 36   No results for input(s): AMMONIA in the last 168 hours. CBC:  Recent Labs Lab 04/06/17 2027 04/07/17 1726 04/08/17 0604 04/09/17 0541 04/12/17 0553  WBC 12.8* 10.7* 9.8 6.1 7.5  NEUTROABS  --  8.4*  --   --   --   HGB 14.9 15.1* 12.7 11.3* 14.7  HCT 44.4 43.5 37.3 33.3* 41.8  MCV 86.9 86.3 86.9 86.3 85.0  PLT 304 288 214 199 288   Cardiac Enzymes: No results for input(s): CKTOTAL, CKMB, CKMBINDEX, TROPONINI in the last 168 hours. BNP: BNP (last 3 results)  Recent Labs  03/16/17 0919  BNP 53.5    ProBNP (last 3 results) No results for input(s): PROBNP in the last 8760 hours.  CBG: No results for input(s): GLUCAP in the last 168 hours.

## 2017-04-12 NOTE — Progress Notes (Signed)
Patient ID: Heather Floyd, female   DOB: 07-09-1932, 83 y.o.   MRN: [id number]  PROGRESS NOTE    Heather Floyd  WUJ:[id number] DOB: 1932-11-27 DOA: 04/07/2017  PCP: Daisy Floro, MD   Brief Narrative:  83 year old female with history of dementia, hypertension, dyslipidemia and RLS who presented to ED with abdominal pain for past 4 days prior to the admission. Pt did not tolerate much PO intake at home and has had vomiting even after small amount of food. She also had abdominal bloating, distention.   Assessment & Plan:   Principal Problem:   Enteritis / abdominal pain nausea and vomiting  - CT scan showed signs of enteritis and possibly early SBO - We will continue rocephin and flagyl - GI pathogen panel negative - Blood cx negative - Surgery signed off 10/25  Active Problems:   Hypokalemia - Secondary to GI losses - Supplement - Magnesium is WNL    Hypertension, essential - Continue Norvasc and lisinopril     Dementia with behavioral disturbance  - Continue haldol, risperidone     DVT prophylaxis: Lovenox subQ Code Status: full code  Family Communication: spoke with daughter over the phone 10/27; no family at the bedside thi sam Disposition Plan: home in next 48 hours    Consultants:   PT - no follow up required   Procedures:   None  Antimicrobials:   Rocephin and flagyl -->   Subjective: No overnight events.  Objective: Vitals:   04/11/17 0719 04/11/17 1055 04/11/17 1300 04/11/17 2205  BP: (!) 186/114 (!) 172/94 (!) 144/68 (!) 185/118  Pulse: 83 83 77 (!) 118  Resp: 18  20 20   Temp: 98.7 F (37.1 C)  98 F (36.7 C) 97.6 F (36.4 C)  TempSrc: Oral  Oral Axillary  SpO2: 93% 93% 95% 93%  Weight:      Height:        Intake/Output Summary (Last 24 hours) at 04/12/17 1029 Last data filed at 04/12/17 0500  Gross per 24 hour  Intake             1140 ml  Output                0 ml  Net             1140 ml   Filed Weights   04/07/17 1653    Weight: 56.7 kg (125 lb)    Physical Exam  Constitutional: Appears well-developed and well-nourished. No distress.  CVS: Rate controlled, S1/S2 + Pulmonary: Effort and breath sounds normal, no stridor, rhonchi, wheezes, rales.  Abdominal: Soft. BS +,  no distension, tenderness, rebound or guarding.  Musculoskeletal: Normal range of motion. No edema and no tenderness.  Lymphadenopathy: No lymphadenopathy noted, cervical, inguinal. Neuro: Alert. Normal reflexes, muscle tone coordination. No cranial nerve deficit. Skin: Skin is warm and dry.  Psychiatric: Normal mood and affect.     Data Reviewed: I have personally reviewed following labs and imaging studies  CBC:  Recent Labs Lab 04/06/17 2027 04/07/17 1726 04/08/17 0604 04/09/17 0541 04/12/17 0553  WBC 12.8* 10.7* 9.8 6.1 7.5  NEUTROABS  --  8.4*  --   --   --   HGB 14.9 15.1* 12.7 11.3* 14.7  HCT 44.4 43.5 37.3 33.3* 41.8  MCV 86.9 86.3 86.9 86.3 85.0  PLT 304 288 214 199 288   Basic Metabolic Panel:  Recent Labs Lab 04/08/17 0604 04/09/17 0541 04/10/17 0539 04/11/17 0558 04/12/17 9562  NA 138 143 142 143 142  K 3.8 3.1* 2.8* 3.3* 3.2*  CL 107 108 105 108 106  CO2 23 27 26 26 26   GLUCOSE 97 91 97 98 127*  BUN 14 8 <5* <5* <5*  CREATININE 0.81 0.68 0.60 0.63 0.61  CALCIUM 8.3* 8.3* 8.5* 8.7* 9.4  MG  --  1.5* 2.2 1.8  --    GFR: Estimated Creatinine Clearance: 42.9 mL/min (by C-G formula based on SCr of 0.61 mg/dL). Liver Function Tests:  Recent Labs Lab 04/06/17 2027 04/07/17 1726 04/08/17 0604  AST 27 28  --   ALT 20 19  --   ALKPHOS 57 59  --   BILITOT 0.8 0.9  --   PROT 7.4 7.3  --   ALBUMIN 4.1 4.0 3.1*    Recent Labs Lab 04/06/17 2027 04/07/17 1726  LIPASE 41 36   No results for input(s): AMMONIA in the last 168 hours. Coagulation Profile: No results for input(s): INR, PROTIME in the last 168 hours. Cardiac Enzymes: No results for input(s): CKTOTAL, CKMB, CKMBINDEX, TROPONINI in  the last 168 hours. BNP (last 3 results) No results for input(s): PROBNP in the last 8760 hours. HbA1C: No results for input(s): HGBA1C in the last 72 hours. CBG: No results for input(s): GLUCAP in the last 168 hours. Lipid Profile: No results for input(s): CHOL, HDL, LDLCALC, TRIG, CHOLHDL, LDLDIRECT in the last 72 hours. Thyroid Function Tests: No results for input(s): TSH, T4TOTAL, FREET4, T3FREE, THYROIDAB in the last 72 hours. Anemia Panel: No results for input(s): VITAMINB12, FOLATE, FERRITIN, TIBC, IRON, RETICCTPCT in the last 72 hours. Urine analysis:    Component Value Date/Time   COLORURINE YELLOW 04/07/2017 1800   APPEARANCEUR TURBID (A) 04/07/2017 1800   LABSPEC 1.025 04/07/2017 1800   PHURINE 5.0 04/07/2017 1800   GLUCOSEU NEGATIVE 04/07/2017 1800   HGBUR NEGATIVE 04/07/2017 1800   BILIRUBINUR NEGATIVE 04/07/2017 1800   KETONESUR NEGATIVE 04/07/2017 1800   PROTEINUR NEGATIVE 04/07/2017 1800   UROBILINOGEN 1.0 09/30/2014 1211   NITRITE NEGATIVE 04/07/2017 1800   LEUKOCYTESUR SMALL (A) 04/07/2017 1800   Sepsis Labs: @LABRCNTIP (procalcitonin:4,lacticidven:4)   Recent Results (from the past 240 hour(s))  Urine culture     Status: Abnormal   Collection Time: 04/07/17 10:00 PM  Result Value Ref Range Status   Specimen Description URINE, RANDOM  Final   Special Requests NONE  Final   Culture MULTIPLE SPECIES PRESENT, SUGGEST RECOLLECTION (A)  Final   Report Status 04/09/2017 FINAL  Final  Blood culture (routine x 2)     Status: None (Preliminary result)   Collection Time: 04/07/17 10:07 PM  Result Value Ref Range Status   Specimen Description BLOOD LEFT ANTECUBITAL  Final   Special Requests   Final    BOTTLES DRAWN AEROBIC AND ANAEROBIC Blood Culture adequate volume   Culture   Final    NO GROWTH 4 DAYS Performed at [facility], [address] [place], [place] [address]    Report Status PENDING  Incomplete  Blood culture (routine x 2)     Status: None  (Preliminary result)   Collection Time: 04/07/17 10:09 PM  Result Value Ref Range Status   Specimen Description BLOOD RIGHT ANTECUBITAL  Final   Special Requests   Final    BOTTLES DRAWN AEROBIC AND ANAEROBIC Blood Culture adequate volume   Culture   Final    NO GROWTH 4 DAYS Performed at [facility], [address] [place], [place]  [address]    Report Status PENDING  Incomplete  Gastrointestinal Panel by PCR , Stool     Status: None   Collection Time: 04/10/17  4:38 PM  Result Value Ref Range Status   Campylobacter species NOT DETECTED NOT DETECTED Final   Plesimonas shigelloides NOT DETECTED NOT DETECTED Final   Salmonella species NOT DETECTED NOT DETECTED Final   Yersinia enterocolitica NOT DETECTED NOT DETECTED Final   Vibrio species NOT DETECTED NOT DETECTED Final   Vibrio cholerae NOT DETECTED NOT DETECTED Final   Enteroaggregative E coli (EAEC) NOT DETECTED NOT DETECTED Final   Enteropathogenic E coli (EPEC) NOT DETECTED NOT DETECTED Final   Enterotoxigenic E coli (ETEC) NOT DETECTED NOT DETECTED Final   Shiga like toxin producing E coli (STEC) NOT DETECTED NOT DETECTED Final   Shigella/Enteroinvasive E coli (EIEC) NOT DETECTED NOT DETECTED Final   Cryptosporidium NOT DETECTED NOT DETECTED Final   Cyclospora cayetanensis NOT DETECTED NOT DETECTED Final   Entamoeba histolytica NOT DETECTED NOT DETECTED Final   Giardia lamblia NOT DETECTED NOT DETECTED Final   Adenovirus F40/41 NOT DETECTED NOT DETECTED Final   Astrovirus NOT DETECTED NOT DETECTED Final   Norovirus GI/GII NOT DETECTED NOT DETECTED Final   Rotavirus A NOT DETECTED NOT DETECTED Final   Sapovirus (I, II, IV, and V) NOT DETECTED NOT DETECTED Final      Radiology Studies: No results found.      Scheduled Meds: . amLODipine  5 mg Oral Daily  . enoxaparin (LOVENOX) injection  40 mg Subcutaneous QHS  . lisinopril  40 mg Oral Daily  . polyethylene glycol  17 g Oral BID  . risperiDONE  0.5 mg Oral  QHS  . senna  1 tablet Oral QHS   Continuous Infusions: . sodium chloride 75 mL/hr at 04/11/17 1419  . cefTRIAXone (ROCEPHIN)  IV Stopped (04/12/17 0225)  . metronidazole Stopped (04/12/17 0000)     LOS: 4 days    Time spent: 25 minutes  Greater than 50% of the time spent on counseling and coordinating the care.   Manson Passey, MD [facility] Pager [phone]  If 7PM-7AM, please contact night-coverage [email or web address] Password [email or web address] 04/12/2017, 10:29 AM

## 2017-04-12 NOTE — Care Management Note (Signed)
Case Management Note  Patient Details  Name: Heather Floyd MRN: [id number] Date of Birth: 06/19/1932  Subjective/Objective:    Enteritis, HTN, abdominal pain                Action/Plan: Discharge Planning: NCM spoke to pt's dtr, Orlie PollenLynne [phone] at bedside. Pt has caregiver in home on Monday-Friday from 730-530pm. No DME needed. Offered choice for HH/list provided. [facility] Contacted [facility] with new referral.   PCP ROSS, Darlen RoundHARLES ALAN MD  Expected Discharge Date:  04/12/17               Expected Discharge Plan:  Home w Home Health Services  In-House Referral:  NA  Discharge planning Services  CM Consult  Post Acute Care Choice:  Home Health Choice offered to:  Adult Children  DME Arranged:  N/A DME Agency:  NA  HH Arranged:  RN HH Agency:  [facility]  Status of Service:  Completed, signed off  If discussed at Long Length of Stay Meetings, dates discussed:    Additional Comments:  Elliot CousinShavis, Lonnell Chaput Ellen, RN 04/12/2017, 3:32 PM

## 2017-05-10 ENCOUNTER — Other Ambulatory Visit: Payer: Self-pay | Admitting: Neurology

## 2017-05-11 ENCOUNTER — Encounter: Payer: Self-pay | Admitting: Podiatry

## 2017-05-11 ENCOUNTER — Ambulatory Visit: Payer: Medicare Other | Admitting: Podiatry

## 2017-05-11 DIAGNOSIS — M79676 Pain in unspecified toe(s): Secondary | ICD-10-CM

## 2017-05-11 DIAGNOSIS — B351 Tinea unguium: Secondary | ICD-10-CM | POA: Diagnosis not present

## 2017-05-12 NOTE — Progress Notes (Signed)
Patient ID: Heather Floyd, female   DOB: 08/26/1932, 83 y.o.   MRN: [id number]  Subjective: 83 year old female presents the office with concerns of thick, painful, elongated, discolored toenails for which she cannot trim herself. Denies any surrounding redness or red streaks or any drainage or other signs of infection. She is using topical anti-fungal medication but not seeing much result. No other complaints.  Objective: General: NAD, presents with daughter   Dermatological:  Nails appear to be hypertrophic, dystrophic, brittle, discolored, elongated 10. There is no swelling erythema or drainage. There is tenderness in nails 1-5 bilaterally particularly the hallux toenails. No other open lesions or pre-ulcerative lesions identified at this time.  Vascular: Dorsalis Pedis artery and Posterior Tibial artery pedal pulses are palpable bilateral with immedate capillary fill time. There is no pain with calf compression, swelling, warmth, erythema.   Neruologic:  Protective sensation intact with Semmes Wienstein monofilament  Musculoskeletal:  No other areas of tenderness bilaterally.  Muscular strength 5/5 in all groups tested bilateral.  Assessment: 83 year old female with symptomatic onychomycosis  Plan: -Treatment options discussed including all alternatives, risks, and complications -Etiology of symptoms were discussed -Nails debrided 10 without complications or bleeding. -Discussed the importance of daily foot inspection. -Follow-up in 3 months or sooner if any problems arise. In the meantime, encouraged to call the office with any questions, concerns, change in symptoms.   Ovid CurdMatthew Hampton [place], DPM

## 2017-06-15 ENCOUNTER — Other Ambulatory Visit: Payer: Self-pay | Admitting: Neurology

## 2017-07-14 ENCOUNTER — Other Ambulatory Visit: Payer: Self-pay | Admitting: Adult Health

## 2017-08-13 ENCOUNTER — Ambulatory Visit: Payer: Medicare Other | Admitting: Podiatry

## 2017-08-20 ENCOUNTER — Encounter: Payer: Self-pay | Admitting: Podiatry

## 2017-08-20 ENCOUNTER — Ambulatory Visit: Payer: Medicare Other | Admitting: Podiatry

## 2017-08-20 DIAGNOSIS — M79676 Pain in unspecified toe(s): Secondary | ICD-10-CM | POA: Diagnosis not present

## 2017-08-20 DIAGNOSIS — B351 Tinea unguium: Secondary | ICD-10-CM

## 2017-08-22 NOTE — Progress Notes (Signed)
Patient ID: Jilda PandaGail M Strain, female   DOB: 02/20/1933, 84 y.o.   MRN: [id number]  Subjective: 84 y.o. female presents the office with concerns of thick, painful, elongated, discolored toenails for which she cannot trim herself. Denies any surrounding redness or red streaks or any drainage or other signs of infection. She is using topical anti-fungal medication but not seeing much result. No other complaints.  Objective: General: NAD, presents with daughter   Dermatological:  Nails appear to be hypertrophic, dystrophic, brittle, discolored, elongated 10. There is no swelling erythema or drainage. There is tenderness in nails 1-5 bilaterally particularly the hallux toenails. No other open lesions or pre-ulcerative lesions identified at this time.  Vascular: Dorsalis Pedis artery and Posterior Tibial artery pedal pulses are palpable bilateral with immedate capillary fill time. There is no pain with calf compression, swelling, warmth, erythema.   Neruologic:  Protective sensation intact with Semmes Wienstein monofilament  Musculoskeletal:  No other areas of tenderness bilaterally.  Muscular strength 5/5 in all groups tested bilateral.  Assessment: 84 year old female with symptomatic onychomycosis  Plan: -Treatment options discussed including all alternatives, risks, and complications -Etiology of symptoms were discussed -Nails debrided 82 without complications or bleeding. -Discussed the importance of daily foot inspection. -Follow-up in 3 months or sooner if any problems arise. In the meantime, encouraged to call the office with any questions, concerns, change in symptoms.   Ovid CurdMatthew Jolyne Laye, DPM

## 2017-08-25 ENCOUNTER — Emergency Department (HOSPITAL_COMMUNITY): Payer: Medicare Other

## 2017-08-25 ENCOUNTER — Encounter (HOSPITAL_COMMUNITY): Payer: Self-pay | Admitting: Internal Medicine

## 2017-08-25 ENCOUNTER — Inpatient Hospital Stay (HOSPITAL_COMMUNITY)
Admission: EM | Admit: 2017-08-25 | Discharge: 2017-08-27 | DRG: 392 | Disposition: A | Discharge status: Home Health | Payer: Medicare Other | Attending: Internal Medicine | Admitting: Internal Medicine

## 2017-08-25 DIAGNOSIS — E876 Hypokalemia: Secondary | ICD-10-CM

## 2017-08-25 DIAGNOSIS — Z8673 Personal history of transient ischemic attack (TIA), and cerebral infarction without residual deficits: Secondary | ICD-10-CM | POA: Diagnosis not present

## 2017-08-25 DIAGNOSIS — K529 Noninfective gastroenteritis and colitis, unspecified: Principal | ICD-10-CM | POA: Diagnosis present

## 2017-08-25 DIAGNOSIS — Z885 Allergy status to narcotic agent status: Secondary | ICD-10-CM | POA: Diagnosis not present

## 2017-08-25 DIAGNOSIS — Z66 Do not resuscitate: Secondary | ICD-10-CM | POA: Diagnosis not present

## 2017-08-25 DIAGNOSIS — Z86010 Personal history of colonic polyps: Secondary | ICD-10-CM | POA: Diagnosis not present

## 2017-08-25 DIAGNOSIS — E785 Hyperlipidemia, unspecified: Secondary | ICD-10-CM | POA: Diagnosis not present

## 2017-08-25 DIAGNOSIS — R1010 Upper abdominal pain, unspecified: Secondary | ICD-10-CM | POA: Diagnosis present

## 2017-08-25 DIAGNOSIS — Z87891 Personal history of nicotine dependence: Secondary | ICD-10-CM | POA: Diagnosis not present

## 2017-08-25 DIAGNOSIS — F0390 Unspecified dementia without behavioral disturbance: Secondary | ICD-10-CM | POA: Diagnosis present

## 2017-08-25 DIAGNOSIS — I10 Essential (primary) hypertension: Secondary | ICD-10-CM | POA: Diagnosis present

## 2017-08-25 DIAGNOSIS — Z886 Allergy status to analgesic agent status: Secondary | ICD-10-CM

## 2017-08-25 LAB — LIPASE, BLOOD: LIPASE: 27 U/L (ref 11–51)

## 2017-08-25 LAB — URINALYSIS, ROUTINE W REFLEX MICROSCOPIC
BACTERIA UA: NONE SEEN
BILIRUBIN URINE: NEGATIVE
Glucose, UA: NEGATIVE mg/dL
HGB URINE DIPSTICK: NEGATIVE
Ketones, ur: NEGATIVE mg/dL
Nitrite: NEGATIVE
PROTEIN: NEGATIVE mg/dL
Specific Gravity, Urine: 1.013 (ref 1.005–1.030)
pH: 6 (ref 5.0–8.0)

## 2017-08-25 LAB — CBC WITH DIFFERENTIAL/PLATELET
BASOS ABS: 0 10*3/uL (ref 0.0–0.1)
Basophils Relative: 0 %
Eosinophils Absolute: 0.1 10*3/uL (ref 0.0–0.7)
Eosinophils Relative: 2 %
HEMATOCRIT: 41.4 % (ref 36.0–46.0)
Hemoglobin: 15 g/dL (ref 12.0–15.0)
LYMPHS ABS: 1.4 10*3/uL (ref 0.7–4.0)
LYMPHS PCT: 21 %
MCH: 29.9 pg (ref 26.0–34.0)
MCHC: 36.2 g/dL — ABNORMAL HIGH (ref 30.0–36.0)
MCV: 82.6 fL (ref 78.0–100.0)
MONO ABS: 0.6 10*3/uL (ref 0.1–1.0)
Monocytes Relative: 9 %
NEUTROS ABS: 4.7 10*3/uL (ref 1.7–7.7)
Neutrophils Relative %: 68 %
PLATELETS: 225 10*3/uL (ref 150–400)
RBC: 5.01 MIL/uL (ref 3.87–5.11)
RDW: 13.6 % (ref 11.5–15.5)
WBC: 6.9 10*3/uL (ref 4.0–10.5)

## 2017-08-25 LAB — COMPREHENSIVE METABOLIC PANEL
ALBUMIN: 3.6 g/dL (ref 3.5–5.0)
ALT: 9 U/L — AB (ref 14–54)
AST: 14 U/L — AB (ref 15–41)
Alkaline Phosphatase: 72 U/L (ref 38–126)
Anion gap: 8 (ref 5–15)
BUN: 9 mg/dL (ref 6–20)
CHLORIDE: 95 mmol/L — AB (ref 101–111)
CO2: 39 mmol/L — AB (ref 22–32)
Calcium: 9.1 mg/dL (ref 8.9–10.3)
Creatinine, Ser: 0.72 mg/dL (ref 0.44–1.00)
GFR calc Af Amer: >60 mL/min (ref >60)
GFR calc non Af Amer: >60 mL/min (ref >60)
Glucose, Bld: 105 mg/dL — ABNORMAL HIGH (ref 65–99)
POTASSIUM: 2.1 mmol/L — AB (ref 3.5–5.1)
SODIUM: 142 mmol/L (ref 135–145)
Total Bilirubin: 0.7 mg/dL (ref 0.3–1.2)
Total Protein: 6.9 g/dL (ref 6.5–8.1)

## 2017-08-25 MED ORDER — ONDANSETRON HCL 4 MG/2ML IJ SOLN: 4.0000 mg | Freq: Four times a day (QID) | INTRAMUSCULAR | Status: DC | PRN

## 2017-08-25 MED ORDER — HYDROXYZINE HCL 25 MG PO TABS
25.0000 mg | ORAL_TABLET | Freq: Three times a day (TID) | ORAL | Status: DC | PRN
  Administered 2017-08-26 – 2017-08-27 (×3): 25 mg via ORAL
  Filled 2017-08-25 (×5): qty 1

## 2017-08-25 MED ORDER — ONDANSETRON HCL 4 MG/2ML IJ SOLN
4.0000 mg | Freq: Once | INTRAMUSCULAR | Status: AC
  Administered 2017-08-25: 4 mg via INTRAVENOUS
  Filled 2017-08-25: qty 2

## 2017-08-25 MED ORDER — RISPERIDONE 0.25 MG PO TABS
0.5000 mg | ORAL_TABLET | Freq: Every day | ORAL | Status: DC
  Administered 2017-08-26 (×2): 0.5 mg via ORAL
  Filled 2017-08-25: qty 1
  Filled 2017-08-25: qty 2

## 2017-08-25 MED ORDER — AMLODIPINE BESYLATE 5 MG PO TABS
5.0000 mg | ORAL_TABLET | Freq: Every day | ORAL | Status: DC
  Administered 2017-08-26: 5 mg via ORAL
  Filled 2017-08-25: qty 1

## 2017-08-25 MED ORDER — PIPERACILLIN-TAZOBACTAM 3.375 G IVPB 30 MIN
3.3750 g | Freq: Once | INTRAVENOUS | Status: AC
  Administered 2017-08-25: 3.375 g via INTRAVENOUS
  Filled 2017-08-25: qty 50

## 2017-08-25 MED ORDER — POTASSIUM CHLORIDE 10 MEQ/100ML IV SOLN
10.0000 meq | Freq: Once | INTRAVENOUS | Status: AC
  Administered 2017-08-26: 10 meq via INTRAVENOUS
  Filled 2017-08-25: qty 100

## 2017-08-25 MED ORDER — POTASSIUM CHLORIDE CRYS ER 20 MEQ PO TBCR: 40.0000 meq | EXTENDED_RELEASE_TABLET | Freq: Once | ORAL | Status: DC

## 2017-08-25 MED ORDER — MEMANTINE HCL ER 28 MG PO CP24
28.0000 mg | ORAL_CAPSULE | Freq: Every day | ORAL | Status: DC
  Administered 2017-08-26 – 2017-08-27 (×2): 28 mg via ORAL
  Filled 2017-08-25 (×2): qty 1

## 2017-08-25 MED ORDER — SODIUM CHLORIDE 0.9 % IV SOLN
INTRAVENOUS | Status: DC
  Administered 2017-08-25: 19:00:00 via INTRAVENOUS

## 2017-08-25 MED ORDER — ONDANSETRON HCL 4 MG PO TABS: 4.0000 mg | ORAL_TABLET | Freq: Four times a day (QID) | ORAL | Status: DC | PRN

## 2017-08-25 MED ORDER — SODIUM CHLORIDE 0.9 % IV SOLN: INTRAVENOUS | Status: DC

## 2017-08-25 MED ORDER — LISINOPRIL 20 MG PO TABS
40.0000 mg | ORAL_TABLET | Freq: Every day | ORAL | Status: DC
  Administered 2017-08-26 – 2017-08-27 (×2): 40 mg via ORAL
  Filled 2017-08-25 (×2): qty 2

## 2017-08-25 MED ORDER — IOPAMIDOL (ISOVUE-300) INJECTION 61%
INTRAVENOUS | Status: AC
  Administered 2017-08-25: 100 mL
  Filled 2017-08-25: qty 100

## 2017-08-25 MED ORDER — MORPHINE SULFATE (PF) 4 MG/ML IV SOLN
4.0000 mg | Freq: Once | INTRAVENOUS | Status: AC
  Administered 2017-08-25: 4 mg via INTRAVENOUS
  Filled 2017-08-25: qty 1

## 2017-08-25 MED ORDER — POTASSIUM CHLORIDE 20 MEQ PO PACK
40.0000 meq | PACK | Freq: Once | ORAL | Status: AC
  Administered 2017-08-25: 40 meq via ORAL
  Filled 2017-08-25: qty 2

## 2017-08-25 MED ORDER — POTASSIUM CHLORIDE 20 MEQ PO PACK
20.0000 meq | PACK | Freq: Once | ORAL | Status: AC
  Administered 2017-08-25: 20 meq via ORAL
  Filled 2017-08-25: qty 1

## 2017-08-25 MED ORDER — SERTRALINE HCL 100 MG PO TABS
100.0000 mg | ORAL_TABLET | Freq: Every day | ORAL | Status: DC
  Administered 2017-08-26 – 2017-08-27 (×2): 100 mg via ORAL
  Filled 2017-08-25 (×2): qty 1

## 2017-08-25 MED ORDER — SODIUM CHLORIDE 0.9 % IJ SOLN
INTRAMUSCULAR | Status: AC
  Filled 2017-08-25: qty 50

## 2017-08-25 MED ORDER — POTASSIUM CHLORIDE 10 MEQ/100ML IV SOLN
10.0000 meq | Freq: Once | INTRAVENOUS | Status: AC
  Administered 2017-08-25: 10 meq via INTRAVENOUS
  Filled 2017-08-25: qty 100

## 2017-08-25 MED ORDER — ACETAMINOPHEN 325 MG PO TABS: 650.0000 mg | ORAL_TABLET | Freq: Four times a day (QID) | ORAL | Status: DC | PRN

## 2017-08-25 MED ORDER — SODIUM CHLORIDE 0.9 % IV BOLUS (SEPSIS)
500.0000 mL | Freq: Once | INTRAVENOUS | Status: AC
  Administered 2017-08-25: 500 mL via INTRAVENOUS

## 2017-08-25 MED ORDER — POTASSIUM CHLORIDE IN NACL 20-0.9 MEQ/L-% IV SOLN
INTRAVENOUS | Status: AC
  Administered 2017-08-26: via INTRAVENOUS
  Filled 2017-08-25 (×2): qty 1000

## 2017-08-25 MED ORDER — ENOXAPARIN SODIUM 40 MG/0.4ML ~~LOC~~ SOLN
40.0000 mg | Freq: Every day | SUBCUTANEOUS | Status: DC
  Administered 2017-08-26: 40 mg via SUBCUTANEOUS
  Filled 2017-08-25 (×2): qty 0.4

## 2017-08-25 MED ORDER — ACETAMINOPHEN 650 MG RE SUPP: 650.0000 mg | Freq: Four times a day (QID) | RECTAL | Status: DC | PRN

## 2017-08-25 NOTE — H&P (Signed)
History and Physical    Heather PandaGail M Lovern ZOX:[id number] DOB: 07/10/1932 DOA: 08/25/2017  PCP: [name], Charles Alan, MD  Patient coming from: Home.  Patient's daughter.  Chief Complaint: Diarrhea and abdominal pain.  HPI: Heather Floyd is a 84 y.o. female with history of advanced dementia was experiencing constipation 3 days ago and was given Dulcolax.  Following which patient started having multiple episodes of diarrhea.  Also has been having crampy abdominal discomfort.  No vomiting.  Due to persistent symptoms patient was brought to the ER.  No fever or chills.  No history of any use of antibiotics recently.  ED Course: In the ER patient had CT abdomen pelvis which shows diffuse thickening of the small bowel and colon differentials include postinfectious versus inflammatory.  Patient's potassium was also low.  On my exam patient abdomen appears benign at this time.  Patient was given empiric antibiotics and admitted for further hydration and replacement of potassium.  Review of Systems: As per HPI, rest all negative.   Past Medical History:  Diagnosis Date  . Abnormality of gait 09/08/2014  . Carotid artery occlusion   . Concussion with loss of consciousness 09/08/2014  . DJD (degenerative joint disease) of cervical spine   . Hyperlipidemia   . Hypertension   . Memory disturbance 03/29/2013  . Osteoporosis   . Personal history of colonic polyps   . RLS (restless legs syndrome)   . Sleep disorder   . TIA (transient ischemic attack)   . Varicose veins     Past Surgical History:  Procedure Laterality Date  . ANTERIOR AND POSTERIOR VAGINAL REPAIR W/ SACROSPINOUS LIGAMENT SUSPENSION    . BREAST REDUCTION SURGERY    . CESAREAN SECTION    . ENDOVENOUS ABLATION SAPHENOUS VEIN W/ LASER    . HEMORRHOID SURGERY       reports that she quit smoking about 37 years ago. she has never used smokeless tobacco. She reports that she does not drink alcohol or use drugs.  Allergies  Allergen  Reactions  . Aspirin Other (See Comments)    Makes blood too thin  . Codeine Nausea Only    Family History  Problem Relation Age of Onset  . Mental illness Mother   . Alzheimer's disease Mother   . Cancer Father   . Bone cancer Father   . Dementia Sister     Prior to Admission medications   Medication Sig Start Date End Date Taking? Authorizing Provider  amLODipine (NORVASC) 5 MG tablet Take 1 tablet (5 mg total) by mouth daily. 09/16/16  Yes Butch PennyMillikan, Megan, NP  bisacodyl (DULCOLAX) 5 MG EC tablet Take 5 mg by mouth daily as needed for mild constipation.    Yes [provider]  hydrOXYzine (ATARAX/VISTARIL) 25 MG tablet TAKE 1 TABLET BY MOUTH THREE TIMES DAILY AS NEEDED Patient taking differently: TAKE 1 TABLET BY MOUTH THREE TIMES DAILY AS NEEDED FOR ANXIETY 07/14/17  Yes York SpanielWillis, Charles K, MD  lisinopril (PRINIVIL,ZESTRIL) 40 MG tablet Take 40 mg by mouth daily.   Yes [provider]  memantine (NAMENDA XR) 28 MG CP24 24 hr capsule TAKE ONE CAPSULE BY MOUTH ONCE DAILY 02/02/17  Yes York SpanielWillis, Charles K, MD  risperiDONE (RISPERDAL) 0.5 MG tablet TAKE ONE TABLET BY MOUTH AT BEDTIME Patient taking differently: TAKE 5MG  BY MOUTH QHS 02/02/17  Yes York SpanielWillis, Charles K, MD  sertraline (ZOLOFT) 100 MG tablet TAKE 1 TABLET BY MOUTH ONCE DAILY 06/17/17  Yes York SpanielWillis, Charles K, MD  ciprofloxacin (CIPRO) 500 MG/5ML (10%) suspension Take 5 mLs (500 mg total) by mouth 2 (two) times daily. Patient not taking: Reported on 08/25/2017 04/12/17   Alison Murray, MD  metroNIDAZOLE (FLAGYL) 50 mg/ml oral suspension Take 10 mLs (500 mg total) by mouth 3 (three) times daily. Patient not taking: Reported on 08/25/2017 04/12/17   Alison Murray, MD  traZODone (DESYREL) 50 MG tablet TAKE ONE TABLET BY MOUTH AT BEDTIME Patient not taking: Reported on 08/25/2017 05/11/17   [name], MD  vitamin B-12 (CYANOCOBALAMIN) 100 MCG tablet Take 100 mcg by mouth daily.    [provider]     Physical Exam: Vitals:   08/25/17 1712 08/25/17 1830 08/25/17 2132 08/25/17 2300  BP: (!) 169/80 (!) 153/85 (!) 154/79 (!) 160/82  Pulse: 64 64 92 72  Resp: 16 14 (!) 21 18  Temp: 97.9 F (36.6 C)     TempSrc: Oral     SpO2: 93% 97% 95% 92%      Constitutional: Moderately built and nourished. Vitals:   08/25/17 1712 08/25/17 1830 08/25/17 2132 08/25/17 2300  BP: (!) 169/80 (!) 153/85 (!) 154/79 (!) 160/82  Pulse: 64 64 92 72  Resp: 16 14 (!) 21 18  Temp: 97.9 F (36.6 C)     TempSrc: Oral     SpO2: 93% 97% 95% 92%   Eyes: Anicteric no pallor. ENMT: No discharge from the ears eyes nose or mouth. Neck: No mass felt.  No neck rigidity. Respiratory: No rhonchi or crepitations. Cardiovascular: S1-S2 heard no murmurs appreciated. Abdomen: Soft nontender bowel sounds present. Musculoskeletal: No edema.  No joint effusion. Skin: No rash.  Skin appears warm. Neurologic: Alert awake oriented to name.  Has advanced dementia.  Moves all extremities. Psychiatric: Has advanced dementia.   Labs on Admission: I have personally reviewed following labs and imaging studies  CBC: Recent Labs  Lab 08/25/17 1846  WBC 6.9  NEUTROABS 4.7  HGB 15.0  HCT 41.4  MCV 82.6  PLT 225   Basic Metabolic Panel: Recent Labs  Lab 08/25/17 1846  NA 142  K 2.1*  CL 95*  CO2 39*  GLUCOSE 105*  BUN 9  CREATININE 0.72  CALCIUM 9.1   GFR: CrCl cannot be calculated (Unknown ideal weight.). Liver Function Tests: Recent Labs  Lab 08/25/17 1846  AST 14*  ALT 9*  ALKPHOS 72  BILITOT 0.7  PROT 6.9  ALBUMIN 3.6   Recent Labs  Lab 08/25/17 1846  LIPASE 27   No results for input(s): AMMONIA in the last 168 hours. Coagulation Profile: No results for input(s): INR, PROTIME in the last 168 hours. Cardiac Enzymes: No results for input(s): CKTOTAL, CKMB, CKMBINDEX, TROPONINI in the last 168 hours. BNP (last 3 results) No results for input(s): PROBNP in the last 8760  hours. HbA1C: No results for input(s): HGBA1C in the last 72 hours. CBG: No results for input(s): GLUCAP in the last 168 hours. Lipid Profile: No results for input(s): CHOL, HDL, LDLCALC, TRIG, CHOLHDL, LDLDIRECT in the last 72 hours. Thyroid Function Tests: No results for input(s): TSH, T4TOTAL, FREET4, T3FREE, THYROIDAB in the last 72 hours. Anemia Panel: No results for input(s): VITAMINB12, FOLATE, FERRITIN, TIBC, IRON, RETICCTPCT in the last 72 hours. Urine analysis:    Component Value Date/Time   COLORURINE YELLOW 08/25/2017 2008   APPEARANCEUR CLEAR 08/25/2017 2008   LABSPEC 1.013 08/25/2017 2008   PHURINE 6.0 08/25/2017 2008   GLUCOSEU NEGATIVE 08/25/2017 2008   HGBUR NEGATIVE  08/25/2017 2008   BILIRUBINUR NEGATIVE 08/25/2017 2008   KETONESUR NEGATIVE 08/25/2017 2008   PROTEINUR NEGATIVE 08/25/2017 2008   UROBILINOGEN 1.0 09/30/2014 1211   NITRITE NEGATIVE 08/25/2017 2008   LEUKOCYTESUR SMALL (A) 08/25/2017 2008   Sepsis Labs: @LABRCNTIP (procalcitonin:4,lacticidven:4) )No results found for this or any previous visit (from the past 240 hour(s)).   Radiological Exams on Admission: Ct Abdomen Pelvis W Contrast  Result Date: 08/25/2017 CLINICAL DATA:  Abdominal pain for the past 2 days. EXAM: CT ABDOMEN AND PELVIS WITH CONTRAST TECHNIQUE: Multidetector CT imaging of the abdomen and pelvis was performed using the standard protocol following bolus administration of intravenous contrast. CONTRAST:  ISOVUE-300 IOPAMIDOL (ISOVUE-300) INJECTION 61% COMPARISON:  None. FINDINGS: Lower chest: Cardiomegaly without pericardial effusion. Left main and three-vessel coronary arteriosclerosis. Subsegmental bibasilar atelectasis. Hepatobiliary: Trace perihepatic ascites. Hepatic steatosis. Uncomplicated cholelithiasis with solitary 9 mm calculus along the dependent wall noted. No biliary dilatation. Pancreas: Normal Spleen: Normal size spleen without mass. Adrenals/Urinary Tract: Normal  bilateral adrenal glands. Tiny too small to characterize interpolar right renal cyst measuring 6 mm. Similar finding in the upper pole the left kidney measuring 3 mm. No nephrolithiasis, enhancing renal mass nor obstructive uropathy. Physiologic distention of the urinary bladder. Stomach/Bowel: Small hiatal hernia. Contracted stomach with normal small bowel rotation. Moderate degree of transmural inflammatory thickening of small and large bowel from mid jejunum through approximately proximal descending colon. No bowel obstruction or free air. The appendix is not well visualized. Vascular/Lymphatic: Moderate to marked aorto-bi-iliofemoral atherosclerosis. Stable borderline enlargement of portacaval and porta hepatic lymph nodes. Reproductive: Status post hysterectomy.  No adnexal mass. Other: Moderate free fluid in the pelvis. Musculoskeletal: The bones are demineralized. Degenerative disc disease of the lumbar spine most marked at L5-S1 with small disc protrusion. Chronic mild superior endplate compression of L2. IMPRESSION: 1. Transmural thickening of small and large bowel as above described compatible with a diffuse postinflammatory or postinfectious enterocolitis. 2. Small amount of free fluid in the pelvis and adjacent to the liver. 3. Hepatic steatosis with uncomplicated cholelithiasis. 4. Cardiomegaly with left main and three-vessel coronary arteriosclerosis. Subsegmental bibasilar atelectasis. 5. Chronic changes of the dorsal spine as above with mild superior endplate compression of L2 and degenerative disc disease at L5-S1. Electronically Signed   By: Tollie Eth M.D.   On: 08/25/2017 21:23   Dg Abdomen Acute W/chest  Result Date: 08/25/2017 CLINICAL DATA:  Abdominal pain for 2 days EXAM: DG ABDOMEN ACUTE W/ 1V CHEST COMPARISON:  04/08/2017 FINDINGS: Cardiac shadow is mildly enlarged. Aortic calcifications are seen. Bilateral atelectatic changes are noted. Scattered large and small bowel gas is seen. No  definitive obstructive changes are noted. No free air is noted. No abnormal mass or abnormal calcifications are noted. The bony structures are within normal limits. IMPRESSION: No acute abdominal abnormality noted. Mild bibasilar atelectatic changes are seen. Electronically Signed   By: Alcide Clever M.D.   On: 08/25/2017 18:05    EKG: Independently reviewed.  Normal sinus rhythm with poor R wave progression in the anterior leads.   Assessment/Plan Principal Problem:   Colitis Active Problems:   Hypertension   Hypokalemia   Dementia    1. Diffuse colitis/enteritis -patient is on empiric antibiotics.  If there is any further episodes of diarrhea will get stool studies.  Continue with hydration.  Check lactate level. 2. Severe hypokalemia likely from diarrhea -replace and recheck metabolic panel also check magnesium levels. 3. Hypertension on lisinopril and amlodipine which will be continued along  with as needed IV hydralazine. 4. Dementia on Namenda along with trazodone risperidone and Zoloft. 5. CT scan also showed some spine fracture.  Patient denies any pain in the back.   DVT prophylaxis: Lovenox. Code Status: DNR. Family Communication: Patient's daughter. Disposition Plan: Home. Consults called: None. Admission status: Observation.   Eduard Clos MD [facility] Pager [phone].  If 7PM-7AM, please contact night-coverage [email or web address] Password [email or web address]  08/25/2017, 11:41 PM

## 2017-08-25 NOTE — ED Notes (Signed)
ED Provider at bedside. 

## 2017-08-25 NOTE — ED Provider Notes (Addendum)
[facility]-EMERGENCY DEPT Provider Note   CSN: [id number] Arrival date & time: 08/25/17  1705     History   Chief Complaint Chief Complaint  Patient presents with  . Abdominal Pain    HPI Heather Floyd is a 84 y.o. female.  HPI Patient presents to the emergency room for evaluation of abdominal pain.  According to the nursing report the patient has been having abdominal pain for the past 2 days.  Patient lives at home with her daughter normally.  Her history is somewhat limited because she has dementia.  Patient states she started having abdominal pain today.  She states the pain is in the upper abdomen and she has had some nausea and vomiting.  She denies any constipation.  She denies any trouble with any chest pain or shortness of breath.  No dysuria.  She denies any history of similar problems in the past. Past Medical History:  Diagnosis Date  . Abnormality of gait 09/08/2014  . Carotid artery occlusion   . Concussion with loss of consciousness 09/08/2014  . DJD (degenerative joint disease) of cervical spine   . Hyperlipidemia   . Hypertension   . Memory disturbance 03/29/2013  . Osteoporosis   . Personal history of colonic polyps   . RLS (restless legs syndrome)   . Sleep disorder   . TIA (transient ischemic attack)   . Varicose veins       Past Surgical History:  Procedure Laterality Date  . ANTERIOR AND POSTERIOR VAGINAL REPAIR W/ SACROSPINOUS LIGAMENT SUSPENSION    . BREAST REDUCTION SURGERY    . CESAREAN SECTION    . ENDOVENOUS ABLATION SAPHENOUS VEIN W/ LASER    . HEMORRHOID SURGERY      OB History    No data available       Home Medications    Prior to Admission medications   Medication Sig Start Date End Date Taking? Authorizing Provider  amLODipine (NORVASC) 5 MG tablet Take 1 tablet (5 mg total) by mouth daily. 09/16/16  Yes Butch Penny, NP  bisacodyl (DULCOLAX) 5 MG EC tablet Take 5 mg by mouth daily as needed for mild  constipation.    Yes [provider]  hydrOXYzine (ATARAX/VISTARIL) 25 MG tablet TAKE 1 TABLET BY MOUTH THREE TIMES DAILY AS NEEDED Patient taking differently: TAKE 1 TABLET BY MOUTH THREE TIMES DAILY AS NEEDED FOR ANXIETY 07/14/17  Yes [name], MD  lisinopril (PRINIVIL,ZESTRIL) 40 MG tablet Take 40 mg by mouth daily.   Yes [provider]  memantine (NAMENDA XR) 28 MG CP24 24 hr capsule TAKE ONE CAPSULE BY MOUTH ONCE DAILY 02/02/17  Yes [name], MD  risperiDONE (RISPERDAL) 0.5 MG tablet TAKE ONE TABLET BY MOUTH AT BEDTIME Patient taking differently: TAKE 5MG  BY MOUTH QHS 02/02/17  Yes [name], MD  sertraline (ZOLOFT) 100 MG tablet TAKE 1 TABLET BY MOUTH ONCE DAILY 06/17/17  Yes [name], MD  ciprofloxacin (CIPRO) 500 MG/5ML (10%) suspension Take 5 mLs (500 mg total) by mouth 2 (two) times daily. Patient not taking: Reported on 08/25/2017 04/12/17   Alison Murray, MD  metroNIDAZOLE (FLAGYL) 50 mg/ml oral suspension Take 10 mLs (500 mg total) by mouth 3 (three) times daily. Patient not taking: Reported on 08/25/2017 04/12/17   Alison Murray, MD  traZODone (DESYREL) 50 MG tablet TAKE ONE TABLET BY MOUTH AT BEDTIME Patient not taking: Reported on 08/25/2017 05/11/17   [name], MD  vitamin B-12 (CYANOCOBALAMIN) 100 MCG tablet Take 100 mcg by mouth daily.    [provider]    Family History Family History  Problem Relation Age of Onset  . Mental illness Mother   . Alzheimer's disease Mother   . Cancer Father   . Bone cancer Father   . Dementia Sister     Social History Social History   Tobacco Use  . Smoking status: Former Smoker    Last attempt to quit: 06/16/1980    Years since quitting: 37.2  . Smokeless tobacco: Never Used  Substance Use Topics  . Alcohol use: No  . Drug use: No     Allergies   Aspirin and Codeine   Review of Systems Review of Systems  All other systems reviewed and are  negative.    Physical Exam Updated Vital Signs BP (!) 154/79   Pulse 92   Temp 97.9 F (36.6 C) (Oral)   Resp (!) 21   SpO2 95%   Physical Exam  Constitutional: No distress.  Elderly, frail  HENT:  Head: Normocephalic and atraumatic.  Right Ear: External ear normal.  Left Ear: External ear normal.  Eyes: Conjunctivae are normal. Right eye exhibits no discharge. Left eye exhibits no discharge. No scleral icterus.  Neck: Neck supple. No tracheal deviation present.  Cardiovascular: Normal rate, regular rhythm and intact distal pulses.  Pulmonary/Chest: Effort normal and breath sounds normal. No stridor. No respiratory distress. She has no wheezes. She has no rales.  Abdominal: Soft. Bowel sounds are normal. She exhibits no distension, no fluid wave and no mass. There is tenderness in the right upper quadrant, epigastric area and left upper quadrant. There is no rebound and no guarding.  Musculoskeletal: She exhibits no edema or tenderness.  Neurological: She is alert. She has normal strength. No cranial nerve deficit (no facial droop, extraocular movements intact, no slurred speech) or sensory deficit. She exhibits normal muscle tone. She displays no seizure activity. Coordination normal.  Skin: Skin is warm and dry. No rash noted.  Psychiatric: She has a normal mood and affect.  Nursing note and vitals reviewed.    ED Treatments / Results  Labs (all labs ordered are listed, but only abnormal results are displayed) Labs Reviewed  COMPREHENSIVE METABOLIC PANEL - Abnormal; Notable for the following components:      Result Value   Potassium 2.1 (*)    Chloride 95 (*)    CO2 39 (*)    Glucose, Bld 105 (*)    AST 14 (*)    ALT 9 (*)    All other components within normal limits  CBC WITH DIFFERENTIAL/PLATELET - Abnormal; Notable for the following components:   MCHC 36.2 (*)    All other components within normal limits  URINALYSIS, ROUTINE W REFLEX MICROSCOPIC - Abnormal;  Notable for the following components:   Leukocytes, UA SMALL (*)    Squamous Epithelial / LPF 0-5 (*)    All other components within normal limits  LIPASE, BLOOD    EKG  EKG Interpretation  Date/Time:  Tuesday August 25 2017 18:07:45 EDT Ventricular Rate:  62 PR Interval:    QRS Duration: 82 QT Interval:  527 QTC Calculation: 536 R Axis:   -17 Text Interpretation:  Sinus rhythm Probable anterior infarct, age indeterminate Prolonged QT interval Since last tracing rate slower Confirmed by Linwood Dibbles [phone]) on 08/25/2017 6:26:32 PM       Radiology Ct Abdomen Pelvis W Contrast  Result Date: 08/25/2017 CLINICAL  DATA:  Abdominal pain for the past 2 days. EXAM: CT ABDOMEN AND PELVIS WITH CONTRAST TECHNIQUE: Multidetector CT imaging of the abdomen and pelvis was performed using the standard protocol following bolus administration of intravenous contrast. CONTRAST:  100mL ISOVUE-300 IOPAMIDOL (ISOVUE-300) INJECTION 61% COMPARISON:  None. FINDINGS: Lower chest: Cardiomegaly without pericardial effusion. Left main and three-vessel coronary arteriosclerosis. Subsegmental bibasilar atelectasis. Hepatobiliary: Trace perihepatic ascites. Hepatic steatosis. Uncomplicated cholelithiasis with solitary 9 mm calculus along the dependent wall noted. No biliary dilatation. Pancreas: Normal Spleen: Normal size spleen without mass. Adrenals/Urinary Tract: Normal bilateral adrenal glands. Tiny too small to characterize interpolar right renal cyst measuring 6 mm. Similar finding in the upper pole the left kidney measuring 3 mm. No nephrolithiasis, enhancing renal mass nor obstructive uropathy. Physiologic distention of the urinary bladder. Stomach/Bowel: Small hiatal hernia. Contracted stomach with normal small bowel rotation. Moderate degree of transmural inflammatory thickening of small and large bowel from mid jejunum through approximately proximal descending colon. No bowel obstruction or free air. The appendix is  not well visualized. Vascular/Lymphatic: Moderate to marked aorto-bi-iliofemoral atherosclerosis. Stable borderline enlargement of portacaval and porta hepatic lymph nodes. Reproductive: Status post hysterectomy.  No adnexal mass. Other: Moderate free fluid in the pelvis. Musculoskeletal: The bones are demineralized. Degenerative disc disease of the lumbar spine most marked at L5-S1 with small disc protrusion. Chronic mild superior endplate compression of L2. IMPRESSION: 1. Transmural thickening of small and large bowel as above described compatible with a diffuse postinflammatory or postinfectious enterocolitis. 2. Small amount of free fluid in the pelvis and adjacent to the liver. 3. Hepatic steatosis with uncomplicated cholelithiasis. 4. Cardiomegaly with left main and three-vessel coronary arteriosclerosis. Subsegmental bibasilar atelectasis. 5. Chronic changes of the dorsal spine as above with mild superior endplate compression of L2 and degenerative disc disease at L5-S1. Electronically Signed   By: Tollie Ethavid  Kwon M.D.   On: 08/25/2017 21:23   Dg Abdomen Acute W/chest  Result Date: 08/25/2017 CLINICAL DATA:  Abdominal pain for 2 days EXAM: DG ABDOMEN ACUTE W/ 1V CHEST COMPARISON:  04/08/2017 FINDINGS: Cardiac shadow is mildly enlarged. Aortic calcifications are seen. Bilateral atelectatic changes are noted. Scattered large and small bowel gas is seen. No definitive obstructive changes are noted. No free air is noted. No abnormal mass or abnormal calcifications are noted. The bony structures are within normal limits. IMPRESSION: No acute abdominal abnormality noted. Mild bibasilar atelectatic changes are seen. Electronically Signed   By: Alcide CleverMark  Lukens M.D.   On: 08/25/2017 18:05    Procedures .Critical Care Performed by: Linwood DibblesKnapp, Hawk Mones, MD Authorized by: Linwood DibblesKnapp, Case Vassell, MD   Critical care provider statement:    Critical care time (minutes):  30   Critical care was necessary to treat or prevent imminent or  life-threatening deterioration of the following conditions:  Metabolic crisis   Critical care was time spent personally by me on the following activities:  Discussions with consultants, evaluation of patient's response to treatment, examination of patient, ordering and performing treatments and interventions, ordering and review of laboratory studies, ordering and review of radiographic studies, pulse oximetry, re-evaluation of patient's condition, obtaining history from patient or surrogate and review of old charts   (including critical care time)  Medications Ordered in ED Medications  sodium chloride 0.9 % bolus 500 mL (0 mLs Intravenous Stopped 08/25/17 2037)    And  0.9 %  sodium chloride infusion ( Intravenous New Bag/Given 08/25/17 1852)  potassium chloride 10 mEq in 100 mL IVPB (10 mEq Intravenous New  Bag/Given 08/25/17 2130)  sodium chloride 0.9 % injection (not administered)  piperacillin-tazobactam (ZOSYN) IVPB 3.375 g (not administered)  potassium chloride (KLOR-CON) packet 20 mEq (not administered)  ondansetron (ZOFRAN) injection 4 mg (4 mg Intravenous Given 08/25/17 1852)  morphine 4 MG/ML injection 4 mg (4 mg Intravenous Given 08/25/17 1852)  iopamidol (ISOVUE-300) 61 % injection (100 mLs  Contrast Given 08/25/17 2039)     Initial Impression / Assessment and Plan / ED Course  I have reviewed the triage vital signs and the nursing notes.  Pertinent labs & imaging results that were available during my care of the patient were reviewed by me and considered in my medical decision making (see chart for details).   Patient presented to the emergency room for evaluation of abdominal pain and vomiting.  Patient's laboratory tests were notable for significant hypokalemia, otherwise her laboratory tests are unremarkable.  She did have white blood cells in her urine but no definite UTI.  Patient's CT scan does not show any evidence of obstruction but does suggest colitis.  Patient symptoms  have improved in the ED.  She is no longer vomiting and seems more comfortable after medications.  I doubt ischemic colitis at this point and suspect more of an infectious issue.  I think considering her age , her vomiting and hypokalemia it is reasonable to bring her in for antibiotics IV fluids and potassium replacement.  Final Clinical Impressions(s) / ED Diagnoses   Final diagnoses:  Hypokalemia  Colitis      Linwood Dibbles, MD 08/25/17 2148 Nicholas Lose, MD 09/01/17 [phone]

## 2017-08-25 NOTE — ED Notes (Signed)
Patient transported to X-ray 

## 2017-08-25 NOTE — ED Notes (Signed)
Bed: [id number] Expected date:  Expected time:  Means of arrival:  Comments: EMS-abdominal pain-room 13

## 2017-08-25 NOTE — ED Triage Notes (Signed)
Pt arrived via [facility] from home c/o abdominal pain for the last 2 days. Pt lives at home with her daughter. Per EMS, abdomen is tender in all four quadrants. Pt has hx of dementia and is at baseline.

## 2017-08-25 NOTE — ED Notes (Signed)
Pt's O2 saturation dropped to 88% on RA. This RN placed patient on 2L [place] and O2 saturation came up to 92%. Will notify MD.

## 2017-08-25 NOTE — ED Notes (Signed)
Attempted to get urine, unable to at this time.

## 2017-08-26 ENCOUNTER — Other Ambulatory Visit: Payer: Self-pay

## 2017-08-26 DIAGNOSIS — I10 Essential (primary) hypertension: Secondary | ICD-10-CM | POA: Diagnosis not present

## 2017-08-26 DIAGNOSIS — Z66 Do not resuscitate: Secondary | ICD-10-CM | POA: Diagnosis not present

## 2017-08-26 DIAGNOSIS — Z886 Allergy status to analgesic agent status: Secondary | ICD-10-CM | POA: Diagnosis not present

## 2017-08-26 DIAGNOSIS — R1010 Upper abdominal pain, unspecified: Secondary | ICD-10-CM | POA: Diagnosis present

## 2017-08-26 DIAGNOSIS — Z86010 Personal history of colonic polyps: Secondary | ICD-10-CM | POA: Diagnosis not present

## 2017-08-26 DIAGNOSIS — E785 Hyperlipidemia, unspecified: Secondary | ICD-10-CM | POA: Diagnosis not present

## 2017-08-26 DIAGNOSIS — Z8673 Personal history of transient ischemic attack (TIA), and cerebral infarction without residual deficits: Secondary | ICD-10-CM | POA: Diagnosis not present

## 2017-08-26 DIAGNOSIS — E876 Hypokalemia: Secondary | ICD-10-CM | POA: Diagnosis not present

## 2017-08-26 DIAGNOSIS — Z885 Allergy status to narcotic agent status: Secondary | ICD-10-CM | POA: Diagnosis not present

## 2017-08-26 DIAGNOSIS — Z87891 Personal history of nicotine dependence: Secondary | ICD-10-CM | POA: Diagnosis not present

## 2017-08-26 DIAGNOSIS — K529 Noninfective gastroenteritis and colitis, unspecified: Secondary | ICD-10-CM | POA: Diagnosis not present

## 2017-08-26 DIAGNOSIS — F0390 Unspecified dementia without behavioral disturbance: Secondary | ICD-10-CM | POA: Diagnosis not present

## 2017-08-26 LAB — BASIC METABOLIC PANEL
Anion gap: 9 (ref 5–15)
BUN: 6 mg/dL (ref 6–20)
CHLORIDE: 101 mmol/L (ref 101–111)
CO2: 35 mmol/L — ABNORMAL HIGH (ref 22–32)
Calcium: 8.1 mg/dL — ABNORMAL LOW (ref 8.9–10.3)
Creatinine, Ser: 0.72 mg/dL (ref 0.44–1.00)
GFR calc non Af Amer: >60 mL/min (ref >60)
Glucose, Bld: 92 mg/dL (ref 65–99)
POTASSIUM: 2.2 mmol/L — AB (ref 3.5–5.1)
SODIUM: 145 mmol/L (ref 135–145)

## 2017-08-26 LAB — HEPATIC FUNCTION PANEL
ALBUMIN: 3 g/dL — AB (ref 3.5–5.0)
ALK PHOS: 50 U/L (ref 38–126)
ALT: 8 U/L — ABNORMAL LOW (ref 14–54)
AST: 15 U/L (ref 15–41)
BILIRUBIN TOTAL: 0.5 mg/dL (ref 0.3–1.2)
Bilirubin, Direct: 0.1 mg/dL (ref 0.1–0.5)
Indirect Bilirubin: 0.4 mg/dL (ref 0.3–0.9)
Total Protein: 5.4 g/dL — ABNORMAL LOW (ref 6.5–8.1)

## 2017-08-26 LAB — CBC
HEMATOCRIT: 35.7 % — AB (ref 36.0–46.0)
HEMATOCRIT: 41.5 % (ref 36.0–46.0)
HEMOGLOBIN: 14.8 g/dL (ref 12.0–15.0)
Hemoglobin: 12.2 g/dL (ref 12.0–15.0)
MCH: 29 pg (ref 26.0–34.0)
MCH: 30.1 pg (ref 26.0–34.0)
MCHC: 34.2 g/dL (ref 30.0–36.0)
MCHC: 35.7 g/dL (ref 30.0–36.0)
MCV: 84.5 fL (ref 78.0–100.0)
MCV: 84.8 fL (ref 78.0–100.0)
Platelets: 198 10*3/uL (ref 150–400)
Platelets: 251 10*3/uL (ref 150–400)
RBC: 4.21 MIL/uL (ref 3.87–5.11)
RBC: 4.91 MIL/uL (ref 3.87–5.11)
RDW: 13.9 % (ref 11.5–15.5)
RDW: 14.1 % (ref 11.5–15.5)
WBC: 6.3 10*3/uL (ref 4.0–10.5)
WBC: 7.7 10*3/uL (ref 4.0–10.5)

## 2017-08-26 LAB — CREATININE, SERUM
Creatinine, Ser: 0.7 mg/dL (ref 0.44–1.00)
GFR calc Af Amer: >60 mL/min (ref >60)
GFR calc non Af Amer: >60 mL/min (ref >60)

## 2017-08-26 LAB — POTASSIUM: POTASSIUM: 2.8 mmol/L — AB (ref 3.5–5.1)

## 2017-08-26 LAB — MAGNESIUM: MAGNESIUM: 1.7 mg/dL (ref 1.7–2.4)

## 2017-08-26 LAB — LACTIC ACID, PLASMA: Lactic Acid, Venous: 1.9 mmol/L (ref 0.5–1.9)

## 2017-08-26 MED ORDER — PIPERACILLIN-TAZOBACTAM 3.375 G IVPB
3.3750 g | Freq: Three times a day (TID) | INTRAVENOUS | Status: DC
  Administered 2017-08-26 – 2017-08-27 (×3): 3.375 g via INTRAVENOUS
  Filled 2017-08-26 (×6): qty 50

## 2017-08-26 MED ORDER — POTASSIUM CHLORIDE CRYS ER 20 MEQ PO TBCR
40.0000 meq | EXTENDED_RELEASE_TABLET | Freq: Once | ORAL | Status: AC
  Administered 2017-08-26: 40 meq via ORAL
  Filled 2017-08-26: qty 2

## 2017-08-26 MED ORDER — POTASSIUM CHLORIDE 10 MEQ/100ML IV SOLN
10.0000 meq | INTRAVENOUS | Status: AC
  Administered 2017-08-26 (×6): 10 meq via INTRAVENOUS
  Filled 2017-08-26 (×3): qty 100

## 2017-08-26 MED ORDER — HYDRALAZINE HCL 20 MG/ML IJ SOLN: 10.0000 mg | INTRAMUSCULAR | Status: DC | PRN

## 2017-08-26 MED ORDER — AMLODIPINE BESYLATE 10 MG PO TABS
10.0000 mg | ORAL_TABLET | Freq: Every day | ORAL | Status: DC
  Administered 2017-08-27: 10 mg via ORAL
  Filled 2017-08-26: qty 1

## 2017-08-26 MED ORDER — POTASSIUM CHLORIDE 10 MEQ/100ML IV SOLN
10.0000 meq | INTRAVENOUS | Status: AC
  Administered 2017-08-26 – 2017-08-27 (×3): 10 meq via INTRAVENOUS
  Filled 2017-08-26 (×2): qty 100

## 2017-08-26 NOTE — ED Notes (Signed)
ED TO INPATIENT HANDOFF REPORT  Name/Age/Gender Heather Floyd 84 y.o. female  Code Status    Code Status Orders  (From admission, onward)        Start     Ordered   08/25/17 2340  Do not attempt resuscitation (DNR)  Continuous    Question Answer Comment  In the event of cardiac or respiratory ARREST Do not call a "code blue"   In the event of cardiac or respiratory ARREST Do not perform Intubation, CPR, defibrillation or ACLS   In the event of cardiac or respiratory ARREST Use medication by any route, position, wound care, and other measures to relive pain and suffering. May use oxygen, suction and manual treatment of airway obstruction as needed for comfort.      08/25/17 2340    Code Status History    Date Active Date Inactive Code Status Order ID Comments User Context   04/07/2017 21:37 04/12/2017 17:03 Full Code [id number]  Norval Morton, MD ED   09/30/2014 15:12 10/01/2014 14:10 Full Code [id number]  Nita Sells, MD Inpatient   08/12/2014 00:17 08/15/2014 17:17 Full Code [id number]  Ivor Costa, MD Inpatient   01/14/2013 00:24 01/15/2013 15:32 Full Code [id number]  Berle Mull, MD Inpatient      Home/SNF/Other Home  Chief Complaint abdominal pains  Level of Care/Admitting Diagnosis ED Disposition    ED Disposition Condition Comment   [place]: [facility] [[id number]]  Level of Care: Telemetry [5]  Admit to tele based on following criteria: Monitor QTC interval  Diagnosis: Colitis [841324]  Admitting Physician: Rise Patience [phone]  Attending Physician: Rise Patience [phone]  PT Class (Do Not Modify): Observation [104]  PT Acc Code (Do Not Modify): Observation [10022]       Medical History Past Medical History:  Diagnosis Date  . Abnormality of gait 09/08/2014  . Carotid artery occlusion   . Concussion with loss of consciousness 09/08/2014  . DJD (degenerative joint disease) of cervical spine   . Hyperlipidemia    . Hypertension   . Memory disturbance 03/29/2013  . Osteoporosis   . Personal history of colonic polyps   . RLS (restless legs syndrome)   . Sleep disorder   . TIA (transient ischemic attack)   . Varicose veins     Allergies Allergies  Allergen Reactions  . Aspirin Other (See Comments)    Makes blood too thin  . Codeine Nausea Only    IV Location/Drains/Wounds Patient Lines/Drains/Airways Status   Active Line/Drains/Airways    Name:   Placement date:   Placement time:   Site:   Days:   External Urinary Catheter   04/12/17    0234    -   136          Labs/Imaging Results for orders placed or performed during the hospital encounter of 08/25/17 (from the past 48 hour(s))  Comprehensive metabolic panel     Status: Abnormal   Collection Time: 08/25/17  6:46 PM  Result Value Ref Range   Sodium 142 135 - 145 mmol/L   Potassium 2.1 (LL) 3.5 - 5.1 mmol/L    Comment: CRITICAL RESULT CALLED TO, READ BACK BY AND VERIFIED WITH: Santosha Jividen,H. RN @1937  ON 03.12.19 BY COHEN,K    Chloride 95 (L) 101 - 111 mmol/L   CO2 39 (H) 22 - 32 mmol/L   Glucose, Bld 105 (H) 65 - 99 mg/dL   BUN 9 6 - 20 mg/dL   Creatinine,  Ser 0.72 0.44 - 1.00 mg/dL   Calcium 9.1 8.9 - 10.3 mg/dL   Total Protein 6.9 6.5 - 8.1 g/dL   Albumin 3.6 3.5 - 5.0 g/dL   AST 14 (L) 15 - 41 U/L   ALT 9 (L) 14 - 54 U/L   Alkaline Phosphatase 72 38 - 126 U/L   Total Bilirubin 0.7 0.3 - 1.2 mg/dL   GFR calc non Af Amer >60 >60 mL/min   GFR calc Af Amer >60 >60 mL/min    Comment: (NOTE) The eGFR has been calculated using the CKD EPI equation. This calculation has not been validated in all clinical situations. eGFR's persistently <60 mL/min signify possible Chronic Kidney Disease.    Anion gap 8 5 - 15    Comment: Performed at [facility], [place] [address] [place], [place] [address]  Lipase, blood     Status: None   Collection Time: 08/25/17  6:46 PM  Result Value Ref Range   Lipase 27 11 - 51 U/L     Comment: Performed at [facility], [place] [address], [place], [place] [address]  CBC WITH DIFFERENTIAL     Status: Abnormal   Collection Time: 08/25/17  6:46 PM  Result Value Ref Range   WBC 6.9 4.0 - 10.5 K/uL   RBC 5.01 3.87 - 5.11 MIL/uL   Hemoglobin 15.0 12.0 - 15.0 g/dL   HCT 41.4 36.0 - 46.0 %   MCV 82.6 78.0 - 100.0 fL   MCH 29.9 26.0 - 34.0 pg   MCHC 36.2 (H) 30.0 - 36.0 g/dL   RDW 13.6 11.5 - 15.5 %   Platelets 225 150 - 400 K/uL   Neutrophils Relative % 68 %   Neutro Abs 4.7 1.7 - 7.7 K/uL   Lymphocytes Relative 21 %   Lymphs Abs 1.4 0.7 - 4.0 K/uL   Monocytes Relative 9 %   Monocytes Absolute 0.6 0.1 - 1.0 K/uL   Eosinophils Relative 2 %   Eosinophils Absolute 0.1 0.0 - 0.7 K/uL   Basophils Relative 0 %   Basophils Absolute 0.0 0.0 - 0.1 K/uL    Comment: Performed at [facility], [place] [address], [place], [place] [address]  Urinalysis, Routine w reflex microscopic     Status: Abnormal   Collection Time: 08/25/17  8:08 PM  Result Value Ref Range   Color, Urine YELLOW YELLOW   APPearance CLEAR CLEAR   Specific Gravity, Urine 1.013 1.005 - 1.030   pH 6.0 5.0 - 8.0   Glucose, UA NEGATIVE NEGATIVE mg/dL   Hgb urine dipstick NEGATIVE NEGATIVE   Bilirubin Urine NEGATIVE NEGATIVE   Ketones, ur NEGATIVE NEGATIVE mg/dL   Protein, ur NEGATIVE NEGATIVE mg/dL   Nitrite NEGATIVE NEGATIVE   Leukocytes, UA SMALL (A) NEGATIVE   RBC / HPF 0-5 0 - 5 RBC/hpf   WBC, UA 6-30 0 - 5 WBC/hpf   Bacteria, UA NONE SEEN NONE SEEN   Squamous Epithelial / LPF 0-5 (A) NONE SEEN   Mucus PRESENT    Hyaline Casts, UA PRESENT     Comment: Performed at [facility], [place] [address], [place], [place] [address]  CBC     Status: None   Collection Time: 08/26/17 12:16 AM  Result Value Ref Range   WBC 7.7 4.0 - 10.5 K/uL   RBC 4.91 3.87 - 5.11 MIL/uL   Hemoglobin 14.8 12.0 - 15.0 g/dL   HCT 41.5 36.0 - 46.0 %  MCV 84.5 78.0 - 100.0 fL    MCH 30.1 26.0 - 34.0 pg   MCHC 35.7 30.0 - 36.0 g/dL   RDW 13.9 11.5 - 15.5 %   Platelets 251 150 - 400 K/uL    Comment: Performed at [facility], [place] [address], [place], [place] [address]  Creatinine, serum     Status: None   Collection Time: 08/26/17 12:16 AM  Result Value Ref Range   Creatinine, Ser 0.70 0.44 - 1.00 mg/dL   GFR calc non Af Amer >60 >60 mL/min   GFR calc Af Amer >60 >60 mL/min    Comment: (NOTE) The eGFR has been calculated using the CKD EPI equation. This calculation has not been validated in all clinical situations. eGFR's persistently <60 mL/min signify possible Chronic Kidney Disease. Performed at [facility], [place] [address] [place], [place] [address]   Lactic acid, plasma     Status: None   Collection Time: 08/26/17 12:16 AM  Result Value Ref Range   Lactic Acid, Venous 1.9 0.5 - 1.9 mmol/L    Comment: Performed at [facility], [place] [address] [place], [place] [address]   Ct Abdomen Pelvis W Contrast  Result Date: 08/25/2017 CLINICAL DATA:  Abdominal pain for the past 2 days. EXAM: CT ABDOMEN AND PELVIS WITH CONTRAST TECHNIQUE: Multidetector CT imaging of the abdomen and pelvis was performed using the standard protocol following bolus administration of intravenous contrast. CONTRAST:  140m ISOVUE-300 IOPAMIDOL (ISOVUE-300) INJECTION 61% COMPARISON:  None. FINDINGS: Lower chest: Cardiomegaly without pericardial effusion. Left main and three-vessel coronary arteriosclerosis. Subsegmental bibasilar atelectasis. Hepatobiliary: Trace perihepatic ascites. Hepatic steatosis. Uncomplicated cholelithiasis with solitary 9 mm calculus along the dependent wall noted. No biliary dilatation. Pancreas: Normal Spleen: Normal size spleen without mass. Adrenals/Urinary Tract: Normal bilateral adrenal glands. Tiny too small to characterize interpolar right renal cyst measuring 6 mm. Similar finding in the upper pole the  left kidney measuring 3 mm. No nephrolithiasis, enhancing renal mass nor obstructive uropathy. Physiologic distention of the urinary bladder. Stomach/Bowel: Small hiatal hernia. Contracted stomach with normal small bowel rotation. Moderate degree of transmural inflammatory thickening of small and large bowel from mid jejunum through approximately proximal descending colon. No bowel obstruction or free air. The appendix is not well visualized. Vascular/Lymphatic: Moderate to marked aorto-bi-iliofemoral atherosclerosis. Stable borderline enlargement of portacaval and porta hepatic lymph nodes. Reproductive: Status post hysterectomy.  No adnexal mass. Other: Moderate free fluid in the pelvis. Musculoskeletal: The bones are demineralized. Degenerative disc disease of the lumbar spine most marked at L5-S1 with small disc protrusion. Chronic mild superior endplate compression of L2. IMPRESSION: 1. Transmural thickening of small and large bowel as above described compatible with a diffuse postinflammatory or postinfectious enterocolitis. 2. Small amount of free fluid in the pelvis and adjacent to the liver. 3. Hepatic steatosis with uncomplicated cholelithiasis. 4. Cardiomegaly with left main and three-vessel coronary arteriosclerosis. Subsegmental bibasilar atelectasis. 5. Chronic changes of the dorsal spine as above with mild superior endplate compression of L2 and degenerative disc disease at L5-S1. Electronically Signed   By: DAshley RoyaltyM.D.   On: 08/25/2017 21:23   Dg Abdomen Acute W/chest  Result Date: 08/25/2017 CLINICAL DATA:  Abdominal pain for 2 days EXAM: DG ABDOMEN ACUTE W/ 1V CHEST COMPARISON:  04/08/2017 FINDINGS: Cardiac shadow is mildly enlarged. Aortic calcifications are seen. Bilateral atelectatic changes are noted. Scattered large and small bowel gas is seen. No definitive obstructive changes are noted. No free air  is noted. No abnormal mass or abnormal calcifications are noted. The bony structures  are within normal limits. IMPRESSION: No acute abdominal abnormality noted. Mild bibasilar atelectatic changes are seen. Electronically Signed   By: Inez Catalina M.D.   On: 08/25/2017 18:05    Pending Labs Unresulted Labs (From admission, onward)   Start     Ordered   09/01/17 0500  Creatinine, serum  (enoxaparin (LOVENOX)    CrCl >/= 30 ml/min)  Weekly,   R    Comments:  while on enoxaparin therapy    08/25/17 2340   08/26/17 8916  Basic metabolic panel  Tomorrow morning,   R     08/25/17 2340   08/26/17 0500  CBC  Tomorrow morning,   R     08/25/17 2340   08/26/17 0500  Hepatic function panel  Tomorrow morning,   R     08/25/17 2340   08/26/17 0500  Magnesium  Tomorrow morning,   R     08/25/17 2340      Vitals/Pain Today's Vitals   08/25/17 1830 08/25/17 2132 08/25/17 2300 08/25/17 2323  BP: (!) 153/85 (!) 154/79 (!) 160/82   Pulse: 64 92 72   Resp: 14 (!) 21 18   Temp:      TempSrc:      SpO2: 97% 95% 92%   Weight:    125 lb (56.7 kg)  PainSc:    0-No pain    Isolation Precautions Enteric precautions (UV disinfection)  Medications Medications  0.9 % NaCl with KCl 20 mEq/ L  infusion ( Intravenous New Bag/Given 08/26/17 0009)  amLODipine (NORVASC) tablet 5 mg (not administered)  hydrOXYzine (ATARAX/VISTARIL) tablet 25 mg (25 mg Oral Given 08/26/17 0009)  lisinopril (PRINIVIL,ZESTRIL) tablet 40 mg (not administered)  memantine (NAMENDA XR) 24 hr capsule 28 mg (not administered)  risperiDONE (RISPERDAL) tablet 0.5 mg (0.5 mg Oral Given 08/26/17 0009)  sertraline (ZOLOFT) tablet 100 mg (not administered)  acetaminophen (TYLENOL) tablet 650 mg (not administered)    Or  acetaminophen (TYLENOL) suppository 650 mg (not administered)  ondansetron (ZOFRAN) tablet 4 mg (not administered)    Or  ondansetron (ZOFRAN) injection 4 mg (not administered)  enoxaparin (LOVENOX) injection 40 mg (not administered)  sodium chloride 0.9 % bolus 500 mL (0 mLs Intravenous Stopped 08/25/17  2037)  ondansetron (ZOFRAN) injection 4 mg (4 mg Intravenous Given 08/25/17 1852)  morphine 4 MG/ML injection 4 mg (4 mg Intravenous Given 08/25/17 1852)  potassium chloride 10 mEq in 100 mL IVPB (0 mEq Intravenous Stopped 08/25/17 2238)  iopamidol (ISOVUE-300) 61 % injection (100 mLs  Contrast Given 08/25/17 2039)  piperacillin-tazobactam (ZOSYN) IVPB 3.375 g (0 g Intravenous Stopped 08/25/17 2352)  potassium chloride (KLOR-CON) packet 20 mEq (20 mEq Oral Given 08/25/17 2319)  potassium chloride 10 mEq in 100 mL IVPB (10 mEq Intravenous New Bag/Given 08/26/17 0010)  potassium chloride (KLOR-CON) packet 40 mEq (40 mEq Oral Given 08/25/17 2320)    Mobility walks

## 2017-08-26 NOTE — Progress Notes (Signed)
Pharmacy Antibiotic Note  Jilda PandaGail M Alcon is a 84 y.o. female admitted on 08/25/2017 with Intra-abdominal infection.  Pharmacy has been consulted for zosyn dosing.  Plan: Zosyn 3.375g IV q8h (4 hour infusion).  Weight: 117 lb 8.1 oz (53.3 kg)  Temp (24hrs), Avg:97.9 F (36.6 C), Min:97.9 F (36.6 C), Max:97.9 F (36.6 C)  Recent Labs  Lab 08/25/17 1846 08/26/17 0016  WBC 6.9 7.7  CREATININE 0.72 0.70  LATICACIDVEN  --  1.9    Estimated Creatinine Clearance: 42.9 mL/min (by C-G formula based on SCr of 0.7 mg/dL).    Allergies  Allergen Reactions  . Aspirin Other (See Comments)    Makes blood too thin  . Codeine Nausea Only    Antimicrobials this admission: Zosyn 08/26/2017 >>   Dose adjustments this admission: -  Microbiology results: pending  Thank you for allowing pharmacy to be a part of this patient's care.  Aleene DavidsonGrimsley Jr, Cristobal Advani Crowford 08/26/2017 4:36 AM

## 2017-08-26 NOTE — Progress Notes (Addendum)
PROGRESS NOTE    Heather Floyd  FAO:[id number] DOB: 11/22/1932 DOA: 08/25/2017 PCP: [name], Charles Alan, MD   Brief Narrative: Patient is a 84 y.o. female with history of advanced dementia was experiencing constipation 3 days ago and was given Dulcolax.  Following which patient started having multiple episodes of diarrhea.  Also has been having crampy abdominal discomfort.  No vomiting.  Due to persistent symptoms patient was brought to the ER.  No fever or chills.  No history of any use of antibiotics recently. In the ER patient had CT abdomen pelvis which shows diffuse thickening of the small bowel and colon differentials include postinfectious versus inflammatory.  Patient's potassium was also low      Assessment & Plan:   Principal Problem:   Colitis Active Problems:   Hypertension   Hypokalemia   Dementia  Colitis: Started on empiric antibiotics. On Zosyn. Diarrhea has already improved.  Continue IV fluids.  Lactate level normal CT abdomen shows:Transmural thickening of small and large bowel as above described compatible with a diffuse postinflammatory or postinfectious Enterocolitis. Patient denies any abdominal pain this morning.  She was comfortable.  Severe hypokalemia: Most likely secondary to diarrhea.  Aggressively supplemented today.  We will continue to monitor the levels..  Hypertension: On lisinopril and amlodipine which we will continue.  Found to be hypertensive this morning.  We will increase the dose of amlodipine.  History of dementia: Currently mental status is on baseline.  She is on Namenda, trazodone, risperidone and Zoloft.  Spine compression fracture:CT showed  mild superior endplate compression of L2 and degenerative disc disease at L5-S1.Continue supportive care.   DVT prophylaxis: Lovenox Code Status: Full Family Communication: None present at the bedside Disposition Plan: Likely home after resolution of hypokalemia and diarrhea   Consultants:  None  Procedures:None   Antimicrobials: Zosyn since 08/26/17  Subjective: Patient seen and examined the bedside this morning.  Remains comfortable.  Denies any abdominal pain.  Diarrhea has improved.  Objective: Vitals:   08/26/17 0148 08/26/17 0200 08/26/17 0621 08/26/17 1425  BP: (!) 173/79 (!) 167/79 (!) 151/66 (!) 181/82  Pulse: 79 78 77 85  Resp: 18 18 16 18   Temp:  97.9 F (36.6 C) 98 F (36.7 C) (!) 97.4 F (36.3 C)  TempSrc:  Oral Oral Axillary  SpO2: 90% 99% 92% 97%  Weight:  53.3 kg (117 lb 8.1 oz)      Intake/Output Summary (Last 24 hours) at 08/26/2017 1516 Last data filed at 08/26/2017 1143 Gross per 24 hour  Intake 2178.75 ml  Output -  Net 2178.75 ml   Filed Weights   08/25/17 2323 08/26/17 0200  Weight: 56.7 kg (125 lb) 53.3 kg (117 lb 8.1 oz)    Examination:  General exam: Appears calm and comfortable ,Not in distress, elderly female HEENT:PERRL,Oral mucosa moist, Ear/Nose normal on gross exam Respiratory system: Bilateral equal air entry, normal vesicular breath sounds, no wheezes or crackles  Cardiovascular system: S1 & S2 heard, RRR. No JVD, murmurs, rubs, gallops or clicks. No pedal edema. Gastrointestinal system: Abdomen is nondistended, soft and nontender. No organomegaly or masses felt. Normal bowel sounds heard. Central nervous system: Alert but not  oriented. No focal neurological deficits. Extremities: No edema, no clubbing ,no cyanosis, distal peripheral pulses palpable. Skin: No rashes, lesions or ulcers,no icterus ,no pallor MSK: Normal muscle bulk,tone ,power Psychiatry: Advanced dementia   Data Reviewed: I have personally reviewed following labs and imaging studies  CBC: Recent Labs  Lab  08/25/17 1846 08/26/17 0016 08/26/17 0613  WBC 6.9 7.7 6.3  NEUTROABS 4.7  --   --   HGB 15.0 14.8 12.2  HCT 41.4 41.5 35.7*  MCV 82.6 84.5 84.8  PLT 225 251 198   Basic Metabolic Panel: Recent Labs  Lab 08/25/17 1846 08/26/17 0016  08/26/17 0613  NA 142  --  145  K 2.1*  --  2.2*  CL 95*  --  101  CO2 39*  --  35*  GLUCOSE 105*  --  92  BUN 9  --  6  CREATININE 0.72 0.70 0.72  CALCIUM 9.1  --  8.1*  MG  --   --  1.7   GFR: Estimated Creatinine Clearance: 42.9 mL/min (by C-G formula based on SCr of 0.72 mg/dL). Liver Function Tests: Recent Labs  Lab 08/25/17 1846 08/26/17 0613  AST 14* 15  ALT 9* 8*  ALKPHOS 72 50  BILITOT 0.7 0.5  PROT 6.9 5.4*  ALBUMIN 3.6 3.0*   Recent Labs  Lab 08/25/17 1846  LIPASE 27   No results for input(s): AMMONIA in the last 168 hours. Coagulation Profile: No results for input(s): INR, PROTIME in the last 168 hours. Cardiac Enzymes: No results for input(s): CKTOTAL, CKMB, CKMBINDEX, TROPONINI in the last 168 hours. BNP (last 3 results) No results for input(s): PROBNP in the last 8760 hours. HbA1C: No results for input(s): HGBA1C in the last 72 hours. CBG: No results for input(s): GLUCAP in the last 168 hours. Lipid Profile: No results for input(s): CHOL, HDL, LDLCALC, TRIG, CHOLHDL, LDLDIRECT in the last 72 hours. Thyroid Function Tests: No results for input(s): TSH, T4TOTAL, FREET4, T3FREE, THYROIDAB in the last 72 hours. Anemia Panel: No results for input(s): VITAMINB12, FOLATE, FERRITIN, TIBC, IRON, RETICCTPCT in the last 72 hours. Sepsis Labs: Recent Labs  Lab 08/26/17 0016  LATICACIDVEN 1.9    No results found for this or any previous visit (from the past 240 hour(s)).       Radiology Studies: Ct Abdomen Pelvis W Contrast  Result Date: 08/25/2017 CLINICAL DATA:  Abdominal pain for the past 2 days. EXAM: CT ABDOMEN AND PELVIS WITH CONTRAST TECHNIQUE: Multidetector CT imaging of the abdomen and pelvis was performed using the standard protocol following bolus administration of intravenous contrast. CONTRAST:  ISOVUE-300 IOPAMIDOL (ISOVUE-300) INJECTION 61% COMPARISON:  None. FINDINGS: Lower chest: Cardiomegaly without pericardial effusion. Left  main and three-vessel coronary arteriosclerosis. Subsegmental bibasilar atelectasis. Hepatobiliary: Trace perihepatic ascites. Hepatic steatosis. Uncomplicated cholelithiasis with solitary 9 mm calculus along the dependent wall noted. No biliary dilatation. Pancreas: Normal Spleen: Normal size spleen without mass. Adrenals/Urinary Tract: Normal bilateral adrenal glands. Tiny too small to characterize interpolar right renal cyst measuring 6 mm. Similar finding in the upper pole the left kidney measuring 3 mm. No nephrolithiasis, enhancing renal mass nor obstructive uropathy. Physiologic distention of the urinary bladder. Stomach/Bowel: Small hiatal hernia. Contracted stomach with normal small bowel rotation. Moderate degree of transmural inflammatory thickening of small and large bowel from mid jejunum through approximately proximal descending colon. No bowel obstruction or free air. The appendix is not well visualized. Vascular/Lymphatic: Moderate to marked aorto-bi-iliofemoral atherosclerosis. Stable borderline enlargement of portacaval and porta hepatic lymph nodes. Reproductive: Status post hysterectomy.  No adnexal mass. Other: Moderate free fluid in the pelvis. Musculoskeletal: The bones are demineralized. Degenerative disc disease of the lumbar spine most marked at L5-S1 with small disc protrusion. Chronic mild superior endplate compression of L2. IMPRESSION: 1. Transmural thickening of small and  large bowel as above described compatible with a diffuse postinflammatory or postinfectious enterocolitis. 2. Small amount of free fluid in the pelvis and adjacent to the liver. 3. Hepatic steatosis with uncomplicated cholelithiasis. 4. Cardiomegaly with left main and three-vessel coronary arteriosclerosis. Subsegmental bibasilar atelectasis. 5. Chronic changes of the dorsal spine as above with mild superior endplate compression of L2 and degenerative disc disease at L5-S1. Electronically Signed   By: Tollie Eth M.D.    On: 08/25/2017 21:23   Dg Abdomen Acute W/chest  Result Date: 08/25/2017 CLINICAL DATA:  Abdominal pain for 2 days EXAM: DG ABDOMEN ACUTE W/ 1V CHEST COMPARISON:  04/08/2017 FINDINGS: Cardiac shadow is mildly enlarged. Aortic calcifications are seen. Bilateral atelectatic changes are noted. Scattered large and small bowel gas is seen. No definitive obstructive changes are noted. No free air is noted. No abnormal mass or abnormal calcifications are noted. The bony structures are within normal limits. IMPRESSION: No acute abdominal abnormality noted. Mild bibasilar atelectatic changes are seen. Electronically Signed   By: Alcide Clever M.D.   On: 08/25/2017 18:05        Scheduled Meds: . amLODipine  5 mg Oral Daily  . enoxaparin (LOVENOX) injection  40 mg Subcutaneous QHS  . lisinopril  40 mg Oral Daily  . memantine  28 mg Oral Daily  . risperiDONE  0.5 mg Oral QHS  . sertraline  100 mg Oral Daily   Continuous Infusions: . 0.9 % NaCl with KCl 20 mEq / L 75 mL/hr at 08/26/17 0009  . piperacillin-tazobactam (ZOSYN)  IV 3.375 g (08/26/17 1457)     LOS: 0 days    Time spent: More than 50% of that time was spent in counseling and/or coordination of care.      Meredith Leeds, MD [facility] Pager [phone]  If 7PM-7AM, please contact night-coverage [email or web address] Password [facility] 08/26/2017, 3:16 PM

## 2017-08-26 NOTE — Progress Notes (Signed)
CRITICAL VALUE ALERT  Critical Value: K+ = 2.8 was 2.2  Date & Time Notied:  08/26/17 @1941   Provider Notified: amion text sent   Orders Received/Actions taken:pending

## 2017-08-27 DIAGNOSIS — R1010 Upper abdominal pain, unspecified: Secondary | ICD-10-CM | POA: Diagnosis not present

## 2017-08-27 DIAGNOSIS — K529 Noninfective gastroenteritis and colitis, unspecified: Secondary | ICD-10-CM | POA: Diagnosis not present

## 2017-08-27 LAB — BASIC METABOLIC PANEL
Anion gap: 8 (ref 5–15)
CO2: 32 mmol/L (ref 22–32)
Calcium: 8.3 mg/dL — ABNORMAL LOW (ref 8.9–10.3)
Chloride: 100 mmol/L — ABNORMAL LOW (ref 101–111)
Creatinine, Ser: 0.62 mg/dL (ref 0.44–1.00)
GFR calc Af Amer: >60 mL/min (ref >60)
GFR calc non Af Amer: >60 mL/min (ref >60)
Glucose, Bld: 87 mg/dL (ref 65–99)
POTASSIUM: 3.9 mmol/L (ref 3.5–5.1)
Sodium: 140 mmol/L (ref 135–145)

## 2017-08-27 MED ORDER — POTASSIUM CHLORIDE 10 MEQ/100ML IV SOLN
INTRAVENOUS | Status: AC
  Filled 2017-08-27: qty 100

## 2017-08-27 MED ORDER — CIPROFLOXACIN HCL 500 MG PO TABS: 500.0000 mg | ORAL_TABLET | Freq: Two times a day (BID) | ORAL | Status: DC

## 2017-08-27 MED ORDER — CIPROFLOXACIN HCL 500 MG PO TABS: 500.0000 mg | ORAL_TABLET | Freq: Two times a day (BID) | ORAL | 0 refills | Status: DC

## 2017-08-27 MED ORDER — METRONIDAZOLE 500 MG PO TABS: 500.0000 mg | ORAL_TABLET | Freq: Three times a day (TID) | ORAL | 0 refills | Status: DC

## 2017-08-27 MED ORDER — METRONIDAZOLE 500 MG PO TABS
500.0000 mg | ORAL_TABLET | Freq: Three times a day (TID) | ORAL | Status: DC
  Administered 2017-08-27: 500 mg via ORAL
  Filled 2017-08-27: qty 1

## 2017-08-27 MED ORDER — AMLODIPINE BESYLATE 10 MG PO TABS: 10.0000 mg | ORAL_TABLET | Freq: Every day | ORAL | 0 refills | Status: DC

## 2017-08-27 MED ORDER — POTASSIUM CHLORIDE 10 MEQ/100ML IV SOLN
INTRAVENOUS | Status: AC
  Administered 2017-08-27: 08:00:00
  Filled 2017-08-27: qty 100

## 2017-08-27 MED ORDER — POTASSIUM CHLORIDE 10 MEQ/100ML IV SOLN
INTRAVENOUS | Status: AC
  Administered 2017-08-27: 10 meq
  Filled 2017-08-27: qty 100

## 2017-08-27 MED ORDER — POTASSIUM CHLORIDE ER 20 MEQ PO TBCR: 20.0000 meq | EXTENDED_RELEASE_TABLET | Freq: Every day | ORAL | 0 refills | Status: DC

## 2017-08-27 NOTE — Evaluation (Signed)
Physical Therapy Evaluation Patient Details Name: Heather Floyd MRN: [id number] DOB: 07/14/1932 Today's Date: 08/27/2017   History of Present Illness  84 yo female admitted with colitis, hypokalemia. Hx of dementia, TIA  Clinical Impression  On eval, pt required Min assist for mobility. She walked ~75 feet in hallway without a device. Pt is unsteady. She is impulsive at times. Fairly easy to be redirected when necessary. No family present during session. Recommend 24 hour supervision/assist; HHPT if family is agreeable. Will follow.     Follow Up Recommendations Supervision/Assistance - 24 hour;Home health PT(HHPT if family is agreeable)    Equipment Recommendations  None recommended by PT    Recommendations for Other Services       Precautions / Restrictions Precautions Precautions: Fall Restrictions Weight Bearing Restrictions: No      Mobility  Bed Mobility Overal bed mobility: Needs Assistance Bed Mobility: Supine to Sit;Sit to Supine     Supine to sit: Supervision Sit to supine: Supervision   General bed mobility comments: for safety, lines  Transfers Overall transfer level: Needs assistance Equipment used: Rolling walker (2 wheeled) Transfers: Sit to/from Stand Sit to Stand: Min assist         General transfer comment: Assist to steady. Pt is impulsive. Cues for safety.  Ambulation/Gait Ambulation/Gait assistance: Min assist Ambulation Distance (Feet): 75 Feet Assistive device: None Gait Pattern/deviations: Step-through pattern;Decreased stride length     General Gait Details: Unsteady. Assist required throughout ambulation distance. Pt tolerated distance well.   Stairs            Wheelchair Mobility    Modified Rankin (Stroke Patients Only)       Balance Overall balance assessment: Needs assistance           Standing balance-Leahy Scale: Fair                               Pertinent Vitals/Pain Pain Assessment:  No/denies pain    Home Living Family/patient expects to be discharged to:: Private residence Living Arrangements: Children Available Help at Discharge: Family Type of Home: House Home Access: Stairs to enter Entrance Stairs-Rails: None Entrance Stairs-Number of Steps: 2 Home Layout: One level Home Equipment: Environmental consultantWalker - 2 wheels;Cane - single point;Bedside commode;Shower seat Additional Comments: information retrieved from prior admissions    Prior Function Level of Independence: Independent         Comments: per daughter, pt does not use an assistive device.  Daughter lives with pt     Hand Dominance        Extremity/Trunk Assessment   Upper Extremity Assessment Upper Extremity Assessment: Generalized weakness    Lower Extremity Assessment Lower Extremity Assessment: Generalized weakness    Cervical / Trunk Assessment Cervical / Trunk Assessment: Kyphotic  Communication   Communication: No difficulties  Cognition Arousal/Alertness: Awake/alert Behavior During Therapy: WFL for tasks assessed/performed Overall Cognitive Status: History of cognitive impairments - at baseline                                        General Comments      Exercises     Assessment/Plan    PT Assessment Patient needs continued PT services  PT Problem List Decreased balance;Decreased mobility;Decreased cognition;Decreased strength       PT Treatment Interventions Gait training;Functional mobility training;Balance training;Patient/family education;Therapeutic activities;Therapeutic  exercise;DME instruction    PT Goals (Current goals can be found in the Care Plan section)  Acute Rehab PT Goals Patient Stated Goal: none stated PT Goal Formulation: Patient unable to participate in goal setting Time For Goal Achievement: 09/10/17 Potential to Achieve Goals: Good    Frequency Min 3X/week   Barriers to discharge        Co-evaluation               AM-PAC  PT "6 Clicks" Daily Activity  Outcome Measure Difficulty turning over in bed (including adjusting bedclothes, sheets and blankets)?: None Difficulty moving from lying on back to sitting on the side of the bed? : None Difficulty sitting down on and standing up from a chair with arms (e.g., wheelchair, bedside commode, etc,.)?: A Little Help needed moving to and from a bed to chair (including a wheelchair)?: A Little Help needed walking in hospital room?: A Little Help needed climbing 3-5 steps with a railing? : A Little 6 Click Score: 20    End of Session Equipment Utilized During Treatment: Gait belt Activity Tolerance: Patient tolerated treatment well Patient left: in bed;with call bell/phone within reach;with bed alarm set   PT Visit Diagnosis: Muscle weakness (generalized) (M62.81);Difficulty in walking, not elsewhere classified (R26.2)    Time: 2130-8657 PT Time Calculation (min) (ACUTE ONLY): 10 min   Charges:   PT Evaluation $PT Eval Moderate Complexity: 1 Mod     PT G Codes:          Rebeca Alert, MPT Pager: [phone]

## 2017-08-27 NOTE — Discharge Summary (Signed)
Physician Discharge Summary  Heather PandaGail M Dysart ZOX:[id number] DOB: 03/19/1933 DOA: 08/25/2017  PCP: [name], Charles Alan, MD  Admit date: 08/25/2017 Discharge date: 08/27/2017  Admitted From: Home Disposition: Home  Home Health:Yes  Discharge Condition:Stable CODE STATUS:Full Diet recommendation: Heart Healthy  Brief/Interim Summary: Patientis a 84 y.o.femalewithhistory of advanced dementia was experiencing constipation 3 days ago and was given Dulcolax. Following which patient started having multiple episodes of diarrhea. She also also   having crampy abdominal discomfort. No vomiting. Due to persistent symptoms patient was brought to the ER. No fever or chills.Reported. No history of any use of antibiotics recently. In the ER patient had CT abdomen pelvis which shows diffuse thickening of the small bowel and colon, differentials include postinfectious versus inflammatory. Patient's potassium was also low. Patient was admitted for the management of colitis and hypokalemia.  She was discharged on antibiotics.  Diarrhea has stopped.  She was supplemented with potassium. Patient was also evaluated by physical therapy and recommended home health on discharge. Patient is stable for discharge home today.  She will continue oral antibiotics at home along with potassium supplements.  Following problems were addressed during her hospitalization:  Colitis: Started on empiric antibiotics. Was on Zosyn. Diarrhea has already improved. Lactate level normal CT abdomen shows:Transmural thickening of small and large bowel as above described compatible with a diffuse postinflammatory or postinfectious Enterocolitis. Patient denies any abdominal pain this morning.  She was comfortable. Will change antibiotics to oral, Cipro and Flagyl, which we will continue for a few more days.  Severe hypokalemia: Most likely secondary to diarrhea.  Aggressively supplemented .  Hypertension: On lisinopril and  amlodipine which we will continue.  Found to be hypertensive.  We will increase the dose of amlodipine.  History of dementia: Currently mental status is on baseline.  She is on Namenda, trazodone, risperidone and Zoloft.  Spine compression fracture:CT showed  mild superior endplate compression of L2 and degenerative disc disease at L5-S1.Continue supportive care.     Discharge Diagnoses:  Principal Problem:   Colitis Active Problems:   Hypertension   Hypokalemia   Dementia    Discharge Instructions  Discharge Instructions    Diet - low sodium heart healthy   Complete by:  As directed    Discharge instructions   Complete by:  As directed    1) Take prescribed medications as instructed. 2) Follow up with your PCP in a week. 3) Do a BMP test in a week.   Increase activity slowly   Complete by:  As directed      Allergies as of 08/27/2017      Reactions   Aspirin Other (See Comments)   Makes blood too thin   Codeine Nausea Only      Medication List    STOP taking these medications   ciprofloxacin 500 MG/5ML (10%) suspension Commonly known as:  CIPRO   metroNIDAZOLE 50 mg/ml oral suspension Commonly known as:  FLAGYL Replaced by:  metroNIDAZOLE 500 MG tablet     TAKE these medications   amLODipine 10 MG tablet Commonly known as:  NORVASC Take 1 tablet (10 mg total) by mouth daily. Start taking on:  08/28/2017 What changed:    medication strength  how much to take   bisacodyl 5 MG EC tablet Commonly known as:  DULCOLAX Take 5 mg by mouth daily as needed for mild constipation.   ciprofloxacin 500 MG tablet Commonly known as:  CIPRO Take 1 tablet (500 mg total) by mouth 2 (two)  times daily.   hydrOXYzine 25 MG tablet Commonly known as:  ATARAX/VISTARIL TAKE 1 TABLET BY MOUTH THREE TIMES DAILY AS NEEDED What changed:    how much to take  how to take this  when to take this   lisinopril 40 MG tablet Commonly known as:  PRINIVIL,ZESTRIL Take  40 mg by mouth daily.   memantine 28 MG Cp24 24 hr capsule Commonly known as:  NAMENDA XR TAKE ONE CAPSULE BY MOUTH ONCE DAILY   metroNIDAZOLE 500 MG tablet Commonly known as:  FLAGYL Take 1 tablet (500 mg total) by mouth every 8 (eight) hours. Replaces:  metroNIDAZOLE 50 mg/ml oral suspension   Potassium Chloride ER 20 MEQ Tbcr Take 20 mEq by mouth daily for 5 days.   risperiDONE 0.5 MG tablet Commonly known as:  RISPERDAL TAKE ONE TABLET BY MOUTH AT BEDTIME What changed:    how much to take  how to take this  when to take this   sertraline 100 MG tablet Commonly known as:  ZOLOFT TAKE 1 TABLET BY MOUTH ONCE DAILY   traZODone 50 MG tablet Commonly known as:  DESYREL TAKE ONE TABLET BY MOUTH AT BEDTIME   vitamin B-12 100 MCG tablet Commonly known as:  CYANOCOBALAMIN Take 100 mcg by mouth daily.      Follow-up Information    Daisy Floro, MD. Schedule an appointment as soon as possible for a visit in 1 week(s).   Specialty:  Family Medicine Contact information: [address] [place] [address] [phone]          Allergies  Allergen Reactions  . Aspirin Other (See Comments)    Makes blood too thin  . Codeine Nausea Only    Consultations: None  Procedures/Studies: Ct Abdomen Pelvis W Contrast  Result Date: 08/25/2017 CLINICAL DATA:  Abdominal pain for the past 2 days. EXAM: CT ABDOMEN AND PELVIS WITH CONTRAST TECHNIQUE: Multidetector CT imaging of the abdomen and pelvis was performed using the standard protocol following bolus administration of intravenous contrast. CONTRAST:  ISOVUE-300 IOPAMIDOL (ISOVUE-300) INJECTION 61% COMPARISON:  None. FINDINGS: Lower chest: Cardiomegaly without pericardial effusion. Left main and three-vessel coronary arteriosclerosis. Subsegmental bibasilar atelectasis. Hepatobiliary: Trace perihepatic ascites. Hepatic steatosis. Uncomplicated cholelithiasis with solitary 9 mm calculus along the dependent  wall noted. No biliary dilatation. Pancreas: Normal Spleen: Normal size spleen without mass. Adrenals/Urinary Tract: Normal bilateral adrenal glands. Tiny too small to characterize interpolar right renal cyst measuring 6 mm. Similar finding in the upper pole the left kidney measuring 3 mm. No nephrolithiasis, enhancing renal mass nor obstructive uropathy. Physiologic distention of the urinary bladder. Stomach/Bowel: Small hiatal hernia. Contracted stomach with normal small bowel rotation. Moderate degree of transmural inflammatory thickening of small and large bowel from mid jejunum through approximately proximal descending colon. No bowel obstruction or free air. The appendix is not well visualized. Vascular/Lymphatic: Moderate to marked aorto-bi-iliofemoral atherosclerosis. Stable borderline enlargement of portacaval and porta hepatic lymph nodes. Reproductive: Status post hysterectomy.  No adnexal mass. Other: Moderate free fluid in the pelvis. Musculoskeletal: The bones are demineralized. Degenerative disc disease of the lumbar spine most marked at L5-S1 with small disc protrusion. Chronic mild superior endplate compression of L2. IMPRESSION: 1. Transmural thickening of small and large bowel as above described compatible with a diffuse postinflammatory or postinfectious enterocolitis. 2. Small amount of free fluid in the pelvis and adjacent to the liver. 3. Hepatic steatosis with uncomplicated cholelithiasis. 4. Cardiomegaly with left main and three-vessel coronary arteriosclerosis. Subsegmental bibasilar  atelectasis. 5. Chronic changes of the dorsal spine as above with mild superior endplate compression of L2 and degenerative disc disease at L5-S1. Electronically Signed   By: Tollie Eth M.D.   On: 08/25/2017 21:23   Dg Abdomen Acute W/chest  Result Date: 08/25/2017 CLINICAL DATA:  Abdominal pain for 2 days EXAM: DG ABDOMEN ACUTE W/ 1V CHEST COMPARISON:  04/08/2017 FINDINGS: Cardiac shadow is mildly  enlarged. Aortic calcifications are seen. Bilateral atelectatic changes are noted. Scattered large and small bowel gas is seen. No definitive obstructive changes are noted. No free air is noted. No abnormal mass or abnormal calcifications are noted. The bony structures are within normal limits. IMPRESSION: No acute abdominal abnormality noted. Mild bibasilar atelectatic changes are seen. Electronically Signed   By: Alcide Clever M.D.   On: 08/25/2017 18:05    (Echo, Carotid, EGD, Colonoscopy, ERCP)    Subjective: Patient seen and examined the bedside this morning.  Diarrhea has stopped.  Denies any abdominal pain.  Potassium level normal this morning  Discharge Exam: Vitals:   08/27/17 0518 08/27/17 1347  BP: (!) 144/71 (!) 161/80  Pulse: 75 79  Resp: 18 19  Temp: 98 F (36.7 C) 99 F (37.2 C)  SpO2: 91% 91%   Vitals:   08/26/17 1425 08/26/17 2116 08/27/17 0518 08/27/17 1347  BP: (!) 181/82 (!) 156/80 (!) 144/71 (!) 161/80  Pulse: 85 85 75 79  Resp: 18 18 18 19   Temp: (!) 97.4 F (36.3 C) 98.9 F (37.2 C) 98 F (36.7 C) 99 F (37.2 C)  TempSrc: Axillary Oral Axillary Oral  SpO2: 97% 91% 91% 91%  Weight:        General: Pt is alert, awake, not in acute distress,demented Cardiovascular: RRR, S1/S2 +, no rubs, no gallops Respiratory: CTA bilaterally, no wheezing, no rhonchi Abdominal: Soft, NT, ND, bowel sounds + Extremities: no edema, no cyanosis    The results of significant diagnostics from this hospitalization (including imaging, microbiology, ancillary and laboratory) are listed below for reference.     Microbiology: No results found for this or any previous visit (from the past 240 hour(s)).   Labs: BNP (last 3 results) Recent Labs    03/16/17 0919  BNP 53.5   Basic Metabolic Panel: Recent Labs  Lab 08/25/17 1846 08/26/17 0016 08/26/17 0613 08/26/17 1941 08/27/17 0833  NA 142  --  145  --  140  K 2.1*  --  2.2* 2.8* 3.9  CL 95*  --  101  --  100*   CO2 39*  --  35*  --  32  GLUCOSE 105*  --  92  --  87  BUN 9  --  6  --  <5*  CREATININE 0.72 0.70 0.72  --  0.62  CALCIUM 9.1  --  8.1*  --  8.3*  MG  --   --  1.7  --   --    Liver Function Tests: Recent Labs  Lab 08/25/17 1846 08/26/17 0613  AST 14* 15  ALT 9* 8*  ALKPHOS 72 50  BILITOT 0.7 0.5  PROT 6.9 5.4*  ALBUMIN 3.6 3.0*   Recent Labs  Lab 08/25/17 1846  LIPASE 27   No results for input(s): AMMONIA in the last 168 hours. CBC: Recent Labs  Lab 08/25/17 1846 08/26/17 0016 08/26/17 0613  WBC 6.9 7.7 6.3  NEUTROABS 4.7  --   --   HGB 15.0 14.8 12.2  HCT 41.4 41.5 35.7*  MCV 82.6  84.5 84.8  PLT 225 251 198   Cardiac Enzymes: No results for input(s): CKTOTAL, CKMB, CKMBINDEX, TROPONINI in the last 168 hours. BNP: Invalid input(s): POCBNP CBG: No results for input(s): GLUCAP in the last 168 hours. D-Dimer No results for input(s): DDIMER in the last 72 hours. Hgb A1c No results for input(s): HGBA1C in the last 72 hours. Lipid Profile No results for input(s): CHOL, HDL, LDLCALC, TRIG, CHOLHDL, LDLDIRECT in the last 72 hours. Thyroid function studies No results for input(s): TSH, T4TOTAL, T3FREE, THYROIDAB in the last 72 hours.  Invalid input(s): FREET3 Anemia work up No results for input(s): VITAMINB12, FOLATE, FERRITIN, TIBC, IRON, RETICCTPCT in the last 72 hours. Urinalysis    Component Value Date/Time   COLORURINE YELLOW 08/25/2017 2008   APPEARANCEUR CLEAR 08/25/2017 2008   LABSPEC 1.013 08/25/2017 2008   PHURINE 6.0 08/25/2017 2008   GLUCOSEU NEGATIVE 08/25/2017 2008   HGBUR NEGATIVE 08/25/2017 2008   BILIRUBINUR NEGATIVE 08/25/2017 2008   KETONESUR NEGATIVE 08/25/2017 2008   PROTEINUR NEGATIVE 08/25/2017 2008   UROBILINOGEN 1.0 09/30/2014 1211   NITRITE NEGATIVE 08/25/2017 2008   LEUKOCYTESUR SMALL (A) 08/25/2017 2008   Sepsis Labs Invalid input(s): PROCALCITONIN,  WBC,  LACTICIDVEN Microbiology No results found for this or any  previous visit (from the past 240 hour(s)).   Time coordinating discharge: Over 30 minutes  SIGNED:   Meredith Leeds, MD  [facility] 08/27/2017, 2:19 PM Pager [phone]  If 7PM-7AM, please contact night-coverage [email or web address] Password [email or web address]

## 2017-08-27 NOTE — Progress Notes (Signed)
This CM contacted pt daughter Bonita QuinLinda about HHPT order. Bonita QuinLinda declines home health PT at this time and says she has a caregiver that walks with her. Bonita QuinLinda declines any DME needs as well. Sandford Crazeora Jakaya Jacobowitz RN,BSN,NCM [phone]

## 2017-09-21 ENCOUNTER — Ambulatory Visit: Payer: Medicare Other | Admitting: Adult Health

## 2017-09-21 ENCOUNTER — Encounter: Payer: Self-pay | Admitting: Adult Health

## 2017-09-21 VITALS — BP 153/90 | HR 103 | Ht 62.0 in | Wt 117.0 lb

## 2017-09-21 DIAGNOSIS — F0281 Dementia in other diseases classified elsewhere with behavioral disturbance: Secondary | ICD-10-CM | POA: Diagnosis not present

## 2017-09-21 DIAGNOSIS — G309 Alzheimer's disease, unspecified: Secondary | ICD-10-CM | POA: Diagnosis not present

## 2017-09-21 MED ORDER — QUETIAPINE FUMARATE 25 MG PO TABS: 12.5000 mg | ORAL_TABLET | Freq: Two times a day (BID) | ORAL | 5 refills | Status: DC

## 2017-09-21 MED ORDER — TRAZODONE HCL 50 MG PO TABS: 50.0000 mg | ORAL_TABLET | Freq: Every day | ORAL | 3 refills | Status: DC

## 2017-09-21 MED ORDER — SERTRALINE HCL 100 MG PO TABS: 100.0000 mg | ORAL_TABLET | Freq: Every day | ORAL | 3 refills | Status: AC

## 2017-09-21 MED ORDER — HYDROXYZINE HCL 25 MG PO TABS: 25.0000 mg | ORAL_TABLET | Freq: Three times a day (TID) | ORAL | 3 refills | Status: DC | PRN

## 2017-09-21 MED ORDER — MEMANTINE HCL ER 28 MG PO CP24: 28.0000 mg | ORAL_CAPSULE | Freq: Every day | ORAL | 3 refills | Status: DC

## 2017-09-21 NOTE — Patient Instructions (Addendum)
Your Plan:  Continue namenda  Memory score has declined Stop Risperdal  Start Seroquel    Thank you for coming to see [place] at [facility]. I hope we have been able to provide you high quality care today.  You may receive a patient satisfaction survey over the next few weeks. We would appreciate your feedback and comments so that we may continue to improve ourselves and the health of our patients.  Quetiapine tablets What is this medicine? QUETIAPINE (kwe TYE a peen) is an antipsychotic. It is used to treat schizophrenia and bipolar disorder, also known as manic-depression. This medicine may be used for other purposes; ask your health care provider or pharmacist if you have questions. COMMON BRAND NAME(S): Seroquel What should I tell my health care provider before I take this medicine? They need to know if you have any of these conditions: -brain tumor or head injury -breast cancer -cataracts -diabetes -difficulty swallowing -heart disease -kidney disease -liver disease -low blood counts, like low white cell, platelet, or red cell counts -low blood pressure or dizziness when standing up -Parkinson's disease -previous heart attack -seizures -suicidal thoughts, plans, or attempt by you or a family member -thyroid disease -an unusual or allergic reaction to quetiapine, other medicines, foods, dyes, or preservatives -pregnant or trying to get pregnant -breast-feeding How should I use this medicine? Take this medicine by mouth. Swallow it with a drink of water. Follow the directions on the prescription label. If it upsets your stomach you can take it with food. Take your medicine at regular intervals. Do not take it more often than directed. Do not stop taking except on the advice of your doctor or health care professional. A special MedGuide will be given to you by the pharmacist with each prescription and refill. Be sure to read this information carefully each  time. Talk to your pediatrician regarding the use of this medicine in children. While this drug may be prescribed for children as young as 10 years for selected conditions, precautions do apply. Patients over age 21 years may have a stronger reaction to this medicine and need smaller doses. Overdosage: If you think you have taken too much of this medicine contact a poison control center or emergency room at once. NOTE: This medicine is only for you. Do not share this medicine with others. What if I miss a dose? If you miss a dose, take it as soon as you can. If it is almost time for your next dose, take only that dose. Do not take double or extra doses. What may interact with this medicine? Do not take this medicine with any of the following medications: -certain medicines for fungal infections like fluconazole, itraconazole, ketoconazole, posaconazole, voriconazole -cisapride -dofetilide -dronedarone -droperidol -grepafloxacin -halofantrine -phenothiazines like chlorpromazine, mesoridazine, thioridazine -pimozide -sparfloxacin -ziprasidone This medicine may also interact with the following medications: -alcohol -antiviral medicines for HIV or AIDS -certain medicines for blood pressure -certain medicines for depression, anxiety, or psychotic disturbances like haloperidol, lorazepam -certain medicines for diabetes -certain medicines for Parkinson's disease -certain medicines for seizures like carbamazepine, phenobarbital, phenytoin -cimetidine -erythromycin -other medicines that prolong the QT interval (cause an abnormal heart rhythm) -rifampin -steroid medicines like prednisone or cortisone This list may not describe all possible interactions. Give your health care provider a list of all the medicines, herbs, non-prescription drugs, or dietary supplements you use. Also tell them if you smoke, drink alcohol, or use illegal drugs. Some items may interact with your medicine. What  should I watch for while using this medicine? Visit your doctor or health care professional for regular checks on your progress. It may be several weeks before you see the full effects of this medicine. Your health care provider may suggest that you have your eyes examined prior to starting this medicine, and every 6 months thereafter. If you have been taking this medicine regularly for some time, do not suddenly stop taking it. You must gradually reduce the dose or your symptoms may get worse. Ask your doctor or health care professional for advice. Patients and their families should watch out for worsening depression or thoughts of suicide. Also watch out for sudden or severe changes in feelings such as feeling anxious, agitated, panicky, irritable, hostile, aggressive, impulsive, severely restless, overly excited and hyperactive, or not being able to sleep. If this happens, especially at the beginning of antidepressant treatment or after a change in dose, call your health care professional. Bonita QuinYou may get dizzy or drowsy. Do not drive, use machinery, or do anything that needs mental alertness until you know how this medicine affects you. Do not stand or sit up quickly, especially if you are an older patient. This reduces the risk of dizzy or fainting spells. Alcohol can increase dizziness and drowsiness. Avoid alcoholic drinks. Do not treat yourself for colds, diarrhea or allergies. Ask your doctor or health care professional for advice, some ingredients may increase possible side effects. This medicine can reduce the response of your body to heat or cold. Dress warm in cold weather and stay hydrated in hot weather. If possible, avoid extreme temperatures like saunas, hot tubs, very hot or cold showers, or activities that can cause dehydration such as vigorous exercise. What side effects may I notice from receiving this medicine? Side effects that you should report to your doctor or health care professional  as soon as possible: -allergic reactions like skin rash, itching or hives, swelling of the face, lips, or tongue -difficulty swallowing -fast or irregular heartbeat -fever or chills, sore throat -fever with rash, swollen lymph nodes, or swelling of the face -increased hunger or thirst -increased urination -problems with balance, talking, walking -seizures -stiff muscles -suicidal thoughts or other mood changes -uncontrollable head, mouth, neck, arm, or leg movements -unusually weak or tired Side effects that usually do not require medical attention (report to your doctor or health care professional if they continue or are bothersome): -change in sex drive or performance -constipation -drowsy or dizzy -dry mouth -stomach upset -weight gain This list may not describe all possible side effects. Call your doctor for medical advice about side effects. You may report side effects to FDA at 1-800-FDA-1088. Where should I keep my medicine? Keep out of the reach of children. Store at room temperature between 15 and 30 degrees C (59 and 86 degrees F). Throw away any unused medicine after the expiration date. NOTE: This sheet is a summary. It may not cover all possible information. If you have questions about this medicine, talk to your doctor, pharmacist, or health care provider.  2018 Elsevier/Gold Standard (2014-12-05 13:07:35)

## 2017-09-21 NOTE — Progress Notes (Signed)
PATIENT: Heather Floyd DOB: 12/06/1932  REASON FOR VISIT: follow up HISTORY FROM: patient  HISTORY OF PRESENT ILLNESS: Today 09/21/17 Heather Floyd is an 84 year old female with a history of progressive memory disturbance.  She returns today for follow-up.  She is on Namenda and is tolerating it well.  Her daughter is with her today.  Reports that the main issue is her behavior.  She states that she cannot take her out in public because she says inappropriate things to people.  She is on Risperdal 0.5 mg taking it 3 times a day but this is offering her no benefit.  She has a daughter that lives with her.  She requires assistance with all ADLs.  Is able to feed herself but they may have to help cut up food if needed.  Daughter reports that she is having hallucinations typically at night.  She reports that the patient believes someone is breaking into a car outside.  In the past the patient has had issues with her behavior and has been to [facility].  Daughter reports that this was beneficial for the patient however in order to get her back there she would have to be taken to the emergency room and she does not want to do that.  Patient returns today for follow-up.   HISTORY Heather Floyd is an 84 year old female with a history of progressive memory disturbance. She returns today for follow-up. She is currently taking Namenda and tolerating it well. Her daughter reports that she was in the hospital earlier this week. Reports that she was hard to arouse one morning so they called EMS. When she arrived at the hospital she was back at her baseline. They did do a CT of the head and chest x-ray that was unremarkable. The patient has a caregiver that is with her during the day and her daughter stays with her at night. The patient is able to complete most ADLs independently but she does require some prompting. Daughter reports that she has a hard time hearing in the left ear. She also states that  her mom reports that the left hand hurts. Patient has not had any trauma to the hand. She is also noticed that her mom has been sleeping more throughout the day. She returns today for an evaluation.    REVIEW OF SYSTEMS: Out of a complete 14 system review of symptoms, the patient complains only of the following symptoms, and all other reviewed systems are negative.  See HPI  ALLERGIES: Allergies  Allergen Reactions  . Aspirin Other (See Comments)    Makes blood too thin  . Codeine Nausea Only    HOME MEDICATIONS: Outpatient Medications Prior to Visit  Medication Sig Dispense Refill  . amLODipine (NORVASC) 10 MG tablet Take 1 tablet (10 mg total) by mouth daily. 30 tablet 0  . hydrOXYzine (ATARAX/VISTARIL) 25 MG tablet TAKE 1 TABLET BY MOUTH THREE TIMES DAILY AS NEEDED (Patient taking differently: TAKE 1 TABLET BY MOUTH THREE TIMES DAILY AS NEEDED FOR ANXIETY) 90 tablet 3  . lisinopril (PRINIVIL,ZESTRIL) 40 MG tablet Take 40 mg by mouth daily.    . memantine (NAMENDA XR) 28 MG CP24 24 hr capsule TAKE ONE CAPSULE BY MOUTH ONCE DAILY 30 capsule 6  . Probiotic Product (PROBIOTIC PEARLS PO) Take 1 tablet by mouth daily.    . risperiDONE (RISPERDAL) 0.5 MG tablet TAKE ONE TABLET BY MOUTH AT BEDTIME (Patient taking differently: TAKE 5MG  BY MOUTH QHS) 30 tablet 3  .  sertraline (ZOLOFT) 100 MG tablet TAKE 1 TABLET BY MOUTH ONCE DAILY 90 tablet 1  . traZODone (DESYREL) 50 MG tablet TAKE ONE TABLET BY MOUTH AT BEDTIME 30 tablet 3  . ciprofloxacin (CIPRO) 500 MG tablet Take 1 tablet (500 mg total) by mouth 2 (two) times daily. 10 tablet 0  . metroNIDAZOLE (FLAGYL) 500 MG tablet Take 1 tablet (500 mg total) by mouth every 8 (eight) hours. 15 tablet 0  . potassium chloride 20 MEQ TBCR Take 20 mEq by mouth daily for 5 days. 5 tablet 0  . vitamin B-12 (CYANOCOBALAMIN) 100 MCG tablet Take 100 mcg by mouth daily.     No facility-administered medications prior to visit.     PAST MEDICAL  HISTORY: Past Medical History:  Diagnosis Date  . Abnormality of gait 09/08/2014  . Carotid artery occlusion   . Concussion with loss of consciousness 09/08/2014  . DJD (degenerative joint disease) of cervical spine   . Hyperlipidemia   . Hypertension   . Memory disturbance 03/29/2013  . Osteoporosis   . Personal history of colonic polyps   . RLS (restless legs syndrome)   . Sleep disorder   . TIA (transient ischemic attack)   . Varicose veins     PAST SURGICAL HISTORY: Past Surgical History:  Procedure Laterality Date  . ANTERIOR AND POSTERIOR VAGINAL REPAIR W/ SACROSPINOUS LIGAMENT SUSPENSION    . BREAST REDUCTION SURGERY    . CESAREAN SECTION    . ENDOVENOUS ABLATION SAPHENOUS VEIN W/ LASER    . HEMORRHOID SURGERY      FAMILY HISTORY: Family History  Problem Relation Age of Onset  . Mental illness Mother   . Alzheimer's disease Mother   . Cancer Father   . Bone cancer Father   . Dementia Sister     SOCIAL HISTORY: Social History   Socioeconomic History  . Marital status: Widowed    Spouse name: Not on file  . Number of children: 3  . Years of education: 48  . Highest education level: Not on file  Occupational History  . Occupation: retired  Engineer, production  . Financial resource strain: Not on file  . Food insecurity:    Worry: Not on file    Inability: Not on file  . Transportation needs:    Medical: Not on file    Non-medical: Not on file  Tobacco Use  . Smoking status: Former Smoker    Last attempt to quit: 06/16/1980    Years since quitting: 37.2  . Smokeless tobacco: Never Used  Substance and Sexual Activity  . Alcohol use: No  . Drug use: No  . Sexual activity: Not on file  Lifestyle  . Physical activity:    Days per week: Not on file    Minutes per session: Not on file  . Stress: Not on file  Relationships  . Social connections:    Talks on phone: Not on file    Gets together: Not on file    Attends religious service: Not on file    Active  member of club or organization: Not on file    Attends meetings of clubs or organizations: Not on file    Relationship status: Not on file  . Intimate partner violence:    Fear of current or ex partner: Not on file    Emotionally abused: Not on file    Physically abused: Not on file    Forced sexual activity: Not on file  Other Topics Concern  .  Not on file  Social History Narrative   Lives at home w/ her daughter   Right-handed   Caffeine: coffee in the morning and Coke during the day      PHYSICAL EXAM  Vitals:   09/21/17 1443  BP: (!) 153/90  Pulse: (!) 103  Weight: 117 lb (53.1 kg)  Height: 5\' 2"  (1.575 m)   Body mass index is 21.4 kg/m.   MMSE - Mini Mental State Exam 09/21/2017 03/23/2017 09/16/2016  Orientation to time 0 0 0  Orientation to Place 2 4 3   Registration 3 3 3   Attention/ Calculation 0 0 5  Recall 0 0 0  Language- name 2 objects 2 2 2   Language- repeat 1 1 0  Language- follow 3 step command 2 3 1   Language- read & follow direction 0 1 1  Write a sentence 1 1 1   Copy design 0 0 0  Total score 11 15 16      Generalized: Well developed, in no acute distress. Patient uses profanity when addressing me in the office.   Neurological examination  Mentation: Alert. Follows all commands speech and language fluent Cranial nerve II-XII: Pupils were equal round reactive to light. Extraocular movements were full, visual field were full on confrontational test. Facial sensation and strength were normal. Uvula tongue midline. Head turning and shoulder shrug  were normal and symmetric. Motor: The motor testing reveals 5 over 5 strength of all 4 extremities. Good symmetric motor tone is noted throughout.  Sensory: Sensory testing is intact to soft touch on all 4 extremities. No evidence of extinction is noted.  Coordination: Cerebellar testing reveals good finger-nose-finger and heel-to-shin bilaterally.  Gait and station: Patient uses a cane when  ambulating. Reflexes: Deep tendon reflexes are symmetric and normal bilaterally.   DIAGNOSTIC DATA (LABS, IMAGING, TESTING) - I reviewed patient records, labs, notes, testing and imaging myself where available.  Lab Results  Component Value Date   WBC 6.3 08/26/2017   HGB 12.2 08/26/2017   HCT 35.7 (L) 08/26/2017   MCV 84.8 08/26/2017   PLT 198 08/26/2017      Component Value Date/Time   NA 140 08/27/2017 0833   K 3.9 08/27/2017 0833   CL 100 (L) 08/27/2017 0833   CO2 32 08/27/2017 0833   GLUCOSE 87 08/27/2017 0833   BUN <5 (L) 08/27/2017 0833   CREATININE 0.62 08/27/2017 0833   CALCIUM 8.3 (L) 08/27/2017 0833   PROT 5.4 (L) 08/26/2017 0613   ALBUMIN 3.0 (L) 08/26/2017 0613   AST 15 08/26/2017 0613   ALT 8 (L) 08/26/2017 0613   ALKPHOS 50 08/26/2017 0613   BILITOT 0.5 08/26/2017 0613   GFRNONAA >60 08/27/2017 0833   GFRAA >60 08/27/2017 0833   Lab Results  Component Value Date   CHOL 122 10/01/2014   HDL 20 (L) 10/01/2014   LDLCALC 64 10/01/2014   TRIG 188 (H) 10/01/2014   CHOLHDL 6.1 10/01/2014   Lab Results  Component Value Date   HGBA1C 5.3 09/30/2014   Lab Results  Component Value Date   VITAMINB12 709 03/29/2013   Lab Results  Component Value Date   TSH 1.551 12/11/2015      ASSESSMENT AND PLAN 84 y.o. year old female  has a past medical history of Abnormality of gait (09/08/2014), Carotid artery occlusion, Concussion with loss of consciousness (09/08/2014), DJD (degenerative joint disease) of cervical spine, Hyperlipidemia, Hypertension, Memory disturbance (03/29/2013), Osteoporosis, Personal history of colonic polyps, RLS (restless legs syndrome), Sleep disorder,  TIA (transient ischemic attack), and Varicose veins. here with:  1. Memory disturbance with behavioral issues  Patient's memory score has declined since the last visit.  She will continue on Namenda.  The patient is having behavioral issues.  I discussed with Dr. [name].  We will stop  Risperdal and start Seroquel.  She will begin by taking Seroquel 25 mg a half a tablet twice a day.  I advised that this was a low dose and most likely would have to be increased.  They will call in 2 weeks if this is not offering her any benefit.  I reviewed potential side effects of Seroquel and provided the patient and her daughter with a handout.  Also reviewed black box warning with the daughter.  Advised that if her symptoms worsen or she develops new symptoms they should let us know. She will follow-up in 6 months or sooner if needed.    Butch Penny, MSN, NP-C 09/21/2017, 3:01 PM [facility] [address], [address] [place], [place] [address] [phone]

## 2017-09-21 NOTE — Progress Notes (Signed)
I have read the note, and I agree with the clinical assessment and plan.  Heather Floyd   

## 2017-10-18 ENCOUNTER — Encounter (HOSPITAL_COMMUNITY)
Admission: EM | Disposition: A | Discharge status: Skilled Nursing Facility | Payer: Self-pay | Source: Home / Self Care | Attending: Family Medicine

## 2017-10-18 ENCOUNTER — Emergency Department (HOSPITAL_BASED_OUTPATIENT_CLINIC_OR_DEPARTMENT_OTHER): Payer: Medicare Other

## 2017-10-18 ENCOUNTER — Encounter (HOSPITAL_BASED_OUTPATIENT_CLINIC_OR_DEPARTMENT_OTHER): Payer: Self-pay | Admitting: *Deleted

## 2017-10-18 ENCOUNTER — Other Ambulatory Visit: Payer: Self-pay

## 2017-10-18 ENCOUNTER — Inpatient Hospital Stay (HOSPITAL_BASED_OUTPATIENT_CLINIC_OR_DEPARTMENT_OTHER)
Admission: EM | Admit: 2017-10-18 | Discharge: 2017-10-23 | DRG: 469 | Disposition: A | Discharge status: Skilled Nursing Facility | Payer: Medicare Other | Attending: Family Medicine | Admitting: Family Medicine

## 2017-10-18 DIAGNOSIS — I10 Essential (primary) hypertension: Secondary | ICD-10-CM | POA: Diagnosis not present

## 2017-10-18 DIAGNOSIS — R54 Age-related physical debility: Secondary | ICD-10-CM | POA: Diagnosis present

## 2017-10-18 DIAGNOSIS — Z820 Family history of epilepsy and other diseases of the nervous system: Secondary | ICD-10-CM

## 2017-10-18 DIAGNOSIS — E876 Hypokalemia: Secondary | ICD-10-CM | POA: Diagnosis present

## 2017-10-18 DIAGNOSIS — S065XAA Traumatic subdural hemorrhage with loss of consciousness status unknown, initial encounter: Secondary | ICD-10-CM | POA: Diagnosis present

## 2017-10-18 DIAGNOSIS — R32 Unspecified urinary incontinence: Secondary | ICD-10-CM | POA: Diagnosis present

## 2017-10-18 DIAGNOSIS — Z66 Do not resuscitate: Secondary | ICD-10-CM | POA: Diagnosis present

## 2017-10-18 DIAGNOSIS — D62 Acute posthemorrhagic anemia: Secondary | ICD-10-CM | POA: Diagnosis not present

## 2017-10-18 DIAGNOSIS — R509 Fever, unspecified: Secondary | ICD-10-CM

## 2017-10-18 DIAGNOSIS — F0390 Unspecified dementia without behavioral disturbance: Secondary | ICD-10-CM | POA: Diagnosis present

## 2017-10-18 DIAGNOSIS — Z87891 Personal history of nicotine dependence: Secondary | ICD-10-CM

## 2017-10-18 DIAGNOSIS — S72002A Fracture of unspecified part of neck of left femur, initial encounter for closed fracture: Principal | ICD-10-CM | POA: Diagnosis present

## 2017-10-18 DIAGNOSIS — M47812 Spondylosis without myelopathy or radiculopathy, cervical region: Secondary | ICD-10-CM | POA: Diagnosis present

## 2017-10-18 DIAGNOSIS — E785 Hyperlipidemia, unspecified: Secondary | ICD-10-CM | POA: Diagnosis present

## 2017-10-18 DIAGNOSIS — Z681 Body mass index (BMI) 19 or less, adult: Secondary | ICD-10-CM | POA: Diagnosis not present

## 2017-10-18 DIAGNOSIS — R296 Repeated falls: Secondary | ICD-10-CM | POA: Diagnosis present

## 2017-10-18 DIAGNOSIS — Z96649 Presence of unspecified artificial hip joint: Secondary | ICD-10-CM

## 2017-10-18 DIAGNOSIS — Z87820 Personal history of traumatic brain injury: Secondary | ICD-10-CM | POA: Diagnosis not present

## 2017-10-18 DIAGNOSIS — Z515 Encounter for palliative care: Secondary | ICD-10-CM

## 2017-10-18 DIAGNOSIS — Y92009 Unspecified place in unspecified non-institutional (private) residence as the place of occurrence of the external cause: Secondary | ICD-10-CM

## 2017-10-18 DIAGNOSIS — J189 Pneumonia, unspecified organism: Secondary | ICD-10-CM | POA: Diagnosis present

## 2017-10-18 DIAGNOSIS — Z8673 Personal history of transient ischemic attack (TIA), and cerebral infarction without residual deficits: Secondary | ICD-10-CM

## 2017-10-18 DIAGNOSIS — R159 Full incontinence of feces: Secondary | ICD-10-CM | POA: Diagnosis present

## 2017-10-18 DIAGNOSIS — W1830XA Fall on same level, unspecified, initial encounter: Secondary | ICD-10-CM | POA: Diagnosis present

## 2017-10-18 DIAGNOSIS — R269 Unspecified abnormalities of gait and mobility: Secondary | ICD-10-CM

## 2017-10-18 DIAGNOSIS — G2581 Restless legs syndrome: Secondary | ICD-10-CM | POA: Diagnosis present

## 2017-10-18 DIAGNOSIS — T148XXA Other injury of unspecified body region, initial encounter: Secondary | ICD-10-CM

## 2017-10-18 DIAGNOSIS — S065X9A Traumatic subdural hemorrhage with loss of consciousness of unspecified duration, initial encounter: Secondary | ICD-10-CM | POA: Diagnosis present

## 2017-10-18 DIAGNOSIS — F0391 Unspecified dementia with behavioral disturbance: Secondary | ICD-10-CM | POA: Diagnosis not present

## 2017-10-18 DIAGNOSIS — W19XXXA Unspecified fall, initial encounter: Secondary | ICD-10-CM

## 2017-10-18 LAB — URINALYSIS, COMPLETE (UACMP) WITH MICROSCOPIC
BILIRUBIN URINE: NEGATIVE
Bacteria, UA: NONE SEEN
GLUCOSE, UA: NEGATIVE mg/dL
HGB URINE DIPSTICK: NEGATIVE
KETONES UR: NEGATIVE mg/dL
Leukocytes, UA: NEGATIVE
NITRITE: NEGATIVE
PROTEIN: NEGATIVE mg/dL
Specific Gravity, Urine: 1.013 (ref 1.005–1.030)
pH: 7 (ref 5.0–8.0)

## 2017-10-18 LAB — CBC WITH DIFFERENTIAL/PLATELET
BASOS ABS: 0 10*3/uL (ref 0.0–0.1)
BASOS PCT: 0 %
Eosinophils Absolute: 0 10*3/uL (ref 0.0–0.7)
Eosinophils Relative: 0 %
HEMATOCRIT: 42.4 % (ref 36.0–46.0)
HEMOGLOBIN: 15.7 g/dL — AB (ref 12.0–15.0)
Lymphocytes Relative: 7 %
Lymphs Abs: 0.8 10*3/uL (ref 0.7–4.0)
MCH: 29.8 pg (ref 26.0–34.0)
MCHC: 37 g/dL — ABNORMAL HIGH (ref 30.0–36.0)
MCV: 80.6 fL (ref 78.0–100.0)
MONO ABS: 0.8 10*3/uL (ref 0.1–1.0)
Monocytes Relative: 8 %
NEUTROS ABS: 9.4 10*3/uL — AB (ref 1.7–7.7)
NEUTROS PCT: 85 %
Platelets: 202 10*3/uL (ref 150–400)
RBC: 5.26 MIL/uL — AB (ref 3.87–5.11)
RDW: 13.6 % (ref 11.5–15.5)
WBC: 11.2 10*3/uL — AB (ref 4.0–10.5)

## 2017-10-18 LAB — ABO/RH: ABO/RH(D): O POS

## 2017-10-18 LAB — CBC
HCT: 40.2 % (ref 36.0–46.0)
HEMOGLOBIN: 14.1 g/dL (ref 12.0–15.0)
MCH: 29.6 pg (ref 26.0–34.0)
MCHC: 35.1 g/dL (ref 30.0–36.0)
MCV: 84.3 fL (ref 78.0–100.0)
Platelets: 192 10*3/uL (ref 150–400)
RBC: 4.77 MIL/uL (ref 3.87–5.11)
RDW: 13.8 % (ref 11.5–15.5)
WBC: 9.7 10*3/uL (ref 4.0–10.5)

## 2017-10-18 LAB — MRSA PCR SCREENING: MRSA by PCR: NEGATIVE

## 2017-10-18 LAB — BASIC METABOLIC PANEL
ANION GAP: 16 — AB (ref 5–15)
BUN: 9 mg/dL (ref 6–20)
CALCIUM: 9.2 mg/dL (ref 8.9–10.3)
CO2: 29 mmol/L (ref 22–32)
Chloride: 92 mmol/L — ABNORMAL LOW (ref 101–111)
Creatinine, Ser: 0.71 mg/dL (ref 0.44–1.00)
GFR calc non Af Amer: >60 mL/min (ref >60)
Glucose, Bld: 179 mg/dL — ABNORMAL HIGH (ref 65–99)
POTASSIUM: 2.8 mmol/L — AB (ref 3.5–5.1)
Sodium: 137 mmol/L (ref 135–145)

## 2017-10-18 LAB — CREATININE, SERUM
Creatinine, Ser: 0.75 mg/dL (ref 0.44–1.00)
GFR calc Af Amer: >60 mL/min (ref >60)
GFR calc non Af Amer: >60 mL/min (ref >60)

## 2017-10-18 LAB — PROTIME-INR
INR: 1.12
PROTHROMBIN TIME: 14.3 s (ref 11.4–15.2)

## 2017-10-18 LAB — TYPE AND SCREEN
ABO/RH(D): O POS
Antibody Screen: NEGATIVE

## 2017-10-18 LAB — POTASSIUM
POTASSIUM: 2.4 mmol/L — AB (ref 3.5–5.1)
Potassium: 2.3 mmol/L — CL (ref 3.5–5.1)

## 2017-10-18 SURGERY — ARTHROPLASTY, HIP, TOTAL, ANTERIOR APPROACH
Anesthesia: General | Laterality: Left

## 2017-10-18 MED ORDER — ENALAPRILAT 1.25 MG/ML IV INJ
1.2500 mg | INJECTION | INTRAVENOUS | Status: AC
Start: 2017-10-18 — End: 2017-10-18
  Administered 2017-10-18: 1.25 mg via INTRAVENOUS
  Filled 2017-10-18: qty 1

## 2017-10-18 MED ORDER — POTASSIUM CHLORIDE IN NACL 40-0.9 MEQ/L-% IV SOLN
INTRAVENOUS | Status: AC
  Administered 2017-10-18 – 2017-10-19 (×2): 75 mL/h via INTRAVENOUS
  Filled 2017-10-18 (×2): qty 1000

## 2017-10-18 MED ORDER — HYDROXYZINE HCL 25 MG PO TABS
25.0000 mg | ORAL_TABLET | Freq: Three times a day (TID) | ORAL | Status: DC | PRN
  Administered 2017-10-22 – 2017-10-23 (×3): 25 mg via ORAL
  Filled 2017-10-18 (×3): qty 1

## 2017-10-18 MED ORDER — RISAQUAD PO CAPS
1.0000 | ORAL_CAPSULE | Freq: Every day | ORAL | Status: DC
  Administered 2017-10-19 – 2017-10-23 (×5): 1 via ORAL
  Filled 2017-10-18 (×5): qty 1

## 2017-10-18 MED ORDER — SODIUM CHLORIDE 0.9 % IV BOLUS
1000.0000 mL | Freq: Once | INTRAVENOUS | Status: AC
  Administered 2017-10-18: 1000 mL via INTRAVENOUS

## 2017-10-18 MED ORDER — ENOXAPARIN SODIUM 40 MG/0.4ML ~~LOC~~ SOLN: 40.0000 mg | SUBCUTANEOUS | Status: DC

## 2017-10-18 MED ORDER — POTASSIUM CHLORIDE 10 MEQ/100ML IV SOLN
10.0000 meq | INTRAVENOUS | Status: AC
  Administered 2017-10-18 – 2017-10-19 (×6): 10 meq via INTRAVENOUS
  Filled 2017-10-18 (×6): qty 100

## 2017-10-18 MED ORDER — PROPOFOL 10 MG/ML IV BOLUS
INTRAVENOUS | Status: AC
  Filled 2017-10-18: qty 20

## 2017-10-18 MED ORDER — HYDROCODONE-ACETAMINOPHEN 5-325 MG PO TABS
1.0000 | ORAL_TABLET | Freq: Four times a day (QID) | ORAL | Status: DC | PRN
  Administered 2017-10-18 (×2): 2 via ORAL
  Filled 2017-10-18 (×2): qty 2

## 2017-10-18 MED ORDER — POVIDONE-IODINE 10 % EX SWAB: 2.0000 "application " | Freq: Once | CUTANEOUS | Status: DC

## 2017-10-18 MED ORDER — MEMANTINE HCL ER 28 MG PO CP24
28.0000 mg | ORAL_CAPSULE | Freq: Every day | ORAL | Status: DC
  Administered 2017-10-18 – 2017-10-23 (×6): 28 mg via ORAL
  Filled 2017-10-18 (×6): qty 1

## 2017-10-18 MED ORDER — DOCUSATE SODIUM 100 MG PO CAPS
100.0000 mg | ORAL_CAPSULE | Freq: Two times a day (BID) | ORAL | Status: DC
  Administered 2017-10-18 – 2017-10-19 (×2): 100 mg via ORAL
  Filled 2017-10-18 (×2): qty 1

## 2017-10-18 MED ORDER — SERTRALINE HCL 100 MG PO TABS
100.0000 mg | ORAL_TABLET | Freq: Every day | ORAL | Status: DC
  Administered 2017-10-18 – 2017-10-23 (×6): 100 mg via ORAL
  Filled 2017-10-18 (×6): qty 1

## 2017-10-18 MED ORDER — TRANEXAMIC ACID 1000 MG/10ML IV SOLN
2000.0000 mg | Freq: Once | INTRAVENOUS | Status: DC
  Filled 2017-10-18: qty 20

## 2017-10-18 MED ORDER — PHENYLEPHRINE 40 MCG/ML (10ML) SYRINGE FOR IV PUSH (FOR BLOOD PRESSURE SUPPORT)
PREFILLED_SYRINGE | INTRAVENOUS | Status: AC
  Filled 2017-10-18: qty 10

## 2017-10-18 MED ORDER — LISINOPRIL 40 MG PO TABS
40.0000 mg | ORAL_TABLET | Freq: Every day | ORAL | Status: DC
  Administered 2017-10-18 – 2017-10-23 (×6): 40 mg via ORAL
  Filled 2017-10-18 (×6): qty 1

## 2017-10-18 MED ORDER — BISACODYL 5 MG PO TBEC: 5.0000 mg | DELAYED_RELEASE_TABLET | Freq: Every day | ORAL | Status: DC | PRN

## 2017-10-18 MED ORDER — ONDANSETRON HCL 4 MG/2ML IJ SOLN: 4.0000 mg | Freq: Four times a day (QID) | INTRAMUSCULAR | Status: DC | PRN

## 2017-10-18 MED ORDER — POTASSIUM CHLORIDE 20 MEQ/15ML (10%) PO SOLN
40.0000 meq | Freq: Three times a day (TID) | ORAL | Status: AC
  Administered 2017-10-18: 40 meq via ORAL
  Filled 2017-10-18 (×3): qty 30

## 2017-10-18 MED ORDER — TRANEXAMIC ACID 1000 MG/10ML IV SOLN
1000.0000 mg | INTRAVENOUS | Status: DC
  Filled 2017-10-18: qty 10

## 2017-10-18 MED ORDER — ENOXAPARIN SODIUM 40 MG/0.4ML ~~LOC~~ SOLN
40.0000 mg | SUBCUTANEOUS | Status: DC
  Administered 2017-10-18: 40 mg via SUBCUTANEOUS
  Filled 2017-10-18: qty 0.4

## 2017-10-18 MED ORDER — POTASSIUM CHLORIDE 10 MEQ/100ML IV SOLN
10.0000 meq | Freq: Once | INTRAVENOUS | Status: AC
  Administered 2017-10-18: 10 meq via INTRAVENOUS
  Filled 2017-10-18: qty 100

## 2017-10-18 MED ORDER — POTASSIUM CHLORIDE 20 MEQ/15ML (10%) PO SOLN
40.0000 meq | Freq: Once | ORAL | Status: AC
Start: 2017-10-18 — End: 2017-10-18
  Administered 2017-10-18: 40 meq via ORAL
  Filled 2017-10-18: qty 30

## 2017-10-18 MED ORDER — HYDROMORPHONE HCL 2 MG/ML IJ SOLN
0.5000 mg | INTRAMUSCULAR | Status: DC | PRN
  Administered 2017-10-18 – 2017-10-23 (×8): 0.5 mg via INTRAVENOUS
  Filled 2017-10-18 (×9): qty 1

## 2017-10-18 MED ORDER — CEFAZOLIN SODIUM-DEXTROSE 2-4 GM/100ML-% IV SOLN: 2.0000 g | INTRAVENOUS | Status: DC

## 2017-10-18 MED ORDER — FLEET ENEMA 7-19 GM/118ML RE ENEM: 1.0000 | ENEMA | Freq: Once | RECTAL | Status: DC | PRN

## 2017-10-18 MED ORDER — CEFAZOLIN SODIUM-DEXTROSE 2-4 GM/100ML-% IV SOLN
2.0000 g | Freq: Once | INTRAVENOUS | Status: AC
  Administered 2017-10-19: 2 g via INTRAVENOUS
  Filled 2017-10-18 (×2): qty 100

## 2017-10-18 MED ORDER — QUETIAPINE 12.5 MG HALF TABLET
12.5000 mg | ORAL_TABLET | Freq: Two times a day (BID) | ORAL | Status: DC
  Administered 2017-10-18 – 2017-10-21 (×7): 12.5 mg via ORAL
  Filled 2017-10-18 (×10): qty 1

## 2017-10-18 MED ORDER — AMLODIPINE BESYLATE 10 MG PO TABS
10.0000 mg | ORAL_TABLET | Freq: Every day | ORAL | Status: DC
  Administered 2017-10-19 – 2017-10-22 (×4): 10 mg via ORAL
  Filled 2017-10-18 (×4): qty 1

## 2017-10-18 MED ORDER — HYDROCODONE-ACETAMINOPHEN 7.5-325 MG/15ML PO SOLN
10.0000 mL | Freq: Once | ORAL | Status: AC
  Administered 2017-10-18: 10 mL via ORAL
  Filled 2017-10-18: qty 15

## 2017-10-18 MED ORDER — FENTANYL CITRATE (PF) 100 MCG/2ML IJ SOLN
50.0000 ug | Freq: Once | INTRAMUSCULAR | Status: AC
  Administered 2017-10-18: 50 ug via INTRAVENOUS
  Filled 2017-10-18: qty 2

## 2017-10-18 MED ORDER — FENTANYL CITRATE (PF) 250 MCG/5ML IJ SOLN
INTRAMUSCULAR | Status: AC
  Filled 2017-10-18: qty 5

## 2017-10-18 MED ORDER — TRAZODONE HCL 50 MG PO TABS
50.0000 mg | ORAL_TABLET | Freq: Every day | ORAL | Status: DC
  Administered 2017-10-18 – 2017-10-21 (×4): 50 mg via ORAL
  Filled 2017-10-18 (×4): qty 1

## 2017-10-18 NOTE — Evaluation (Addendum)
Clinical/Bedside Swallow Evaluation Patient Details  Name: Heather Floyd MRN: [id number] Date of Birth: 09-27-1932  Today's Date: 10/18/2017 Time: SLP Start Time (ACUTE ONLY): 1300 SLP Stop Time (ACUTE ONLY): 1325 SLP Time Calculation (min) (ACUTE ONLY): 25 min  Past Medical History:  Past Medical History:  Diagnosis Date  . Abnormality of gait 09/08/2014  . Carotid artery occlusion   . Concussion with loss of consciousness 09/08/2014  . DJD (degenerative joint disease) of cervical spine   . Hyperlipidemia   . Hypertension   . Memory disturbance 03/29/2013  . Osteoporosis   . Personal history of colonic polyps   . RLS (restless legs syndrome)   . Sleep disorder   . TIA (transient ischemic attack)   . Varicose veins    Past Surgical History:  Past Surgical History:  Procedure Laterality Date  . ANTERIOR AND POSTERIOR VAGINAL REPAIR W/ SACROSPINOUS LIGAMENT SUSPENSION    . BREAST REDUCTION SURGERY    . CESAREAN SECTION    . ENDOVENOUS ABLATION SAPHENOUS VEIN W/ LASER    . HEMORRHOID SURGERY     HPI:  Heather Floyd is a 84 y.o. female with dementia and TIA who presents with left hip fracture s/p mechanical fall PTA at home.    Assessment / Plan / Recommendation Clinical Impression  Patient presents with a functional oropharyngeal swallow with appropriate oral transit of bolus and no overt indication of aspiration. Daughter reports occassional c/o globus (not during today's exam) which may account for a mild esophageal dysphagia/presbyesophagus, particularly in an 84 year old. No complaints noted today. Provided daughter with general swallowing and reflux precautions as well as education on signs of aspiration. Aspiration risk has the potential to increase post surgery with impact of anesthesia. No f/u indicated at this time however please reconsult should concerns arise.   SLP Visit Diagnosis: Dysphagia, unspecified (R13.10)    Aspiration Risk  Mild aspiration risk    Diet  Recommendation Regular;Thin liquid   Liquid Administration via: Cup;Straw Medication Administration: Crushed with puree(per family preference) Supervision: Full supervision/cueing for compensatory strategies;Staff to assist with self feeding Compensations: Slow rate;Small sips/bites Postural Changes: Seated upright at 90 degrees;Remain upright for at least 30 minutes after po intake    Other  Recommendations Oral Care Recommendations: Oral care BID   Follow up Recommendations None      Frequency and Duration            Prognosis        Swallow Study   General HPI: Heather Floyd is a 84 y.o. female with dementia and TIA who presents with left hip fracture s/p mechanical fall PTA at home.  Type of Study: Bedside Swallow Evaluation Previous Swallow Assessment: none Diet Prior to this Study: NPO Temperature Spikes Noted: No Respiratory Status: Room air History of Recent Intubation: No Behavior/Cognition: Alert;Cooperative;Pleasant mood;Confused;Distractible;Requires cueing Oral Cavity Assessment: Within Functional Limits Oral Care Completed by SLP: Recent completion by staff Oral Cavity - Dentition: Dentures, top;Dentures, bottom Vision: Functional for self-feeding Self-Feeding Abilities: Able to feed self Patient Positioning: Upright in bed Baseline Vocal Quality: Normal Volitional Cough: Cognitively unable to elicit Volitional Swallow: Unable to elicit    Oral/Motor/Sensory Function Overall Oral Motor/Sensory Function: Within functional limits   Ice Chips Ice chips: Not tested   Thin Liquid Thin Liquid: Within functional limits Presentation: Straw    Nectar Thick Nectar Thick Liquid: Not tested   Honey Thick Honey Thick Liquid: Not tested   Puree Puree: Within functional limits Presentation:  Spoon   Solid   Heather Wasilewski MA, CCC-SLP [phone]    Solid: Within functional limits Presentation: Self Fed        Heather Floyd 10/18/2017,1:25 PM

## 2017-10-18 NOTE — Plan of Care (Signed)
Dicussed with Dr. Ross Marcus. [name] is a 84 y/o F with pmh of HTN, HLD, and advance dementia; who presents after fall.  Potassium noted to be 2.8.  Imaging studies reveal left femoral neck fracture.  Dr. Roda Shutters of orthopedics consulted and recommends transfer to [facility] for possible surgical intervention.  Accepted to a MedSurg bed.  Patient be given oral potassium prior to transport.

## 2017-10-18 NOTE — H&P (Signed)
History and Physical    Heather Floyd DXA:[id number] DOB: 1932-11-06 DOA: 10/18/2017  PCP: Lawerance Cruel, MD   Patient coming from: Home by way of [facility] department.  I have personally briefly reviewed patient's old medical records in [place]  Chief Complaint: Fall at home with pain in left leg  HPI: Heather Floyd is a 84 y.o. female with medical history significant for severe dementia, hypertension, hyperlipidemia, restless leg syndrome, degenerative joint disease of the cervical spine and hypokalemia who presents this morning after a fall at home.  Entire history is obtained from the patient's daughter and the chart patient is unable to give any history due to her profound dementia.  She had a witnessed mechanical fall at home early this morning.  She was at the front door and fell backwards hitting her right back in the doorknob and falling onto her left hip.  She complained of some pain at the time.  HER-2 daughters were present were able to get her up and into bed.  Temp to the get up and walk again and fell.  She did not hit her head on that second fall either.  Given her inability to walk family elected to bring her in for further evaluation and management.  They are also concerned because she has a bruise on her low back.  Profoundly demented and unable to give any useful history.  Does not even complain of pain in her hip.  ED Course: X-ray revealed left femoral neck fracture and hypokalemia with a potassium of 2.8.  On arrival to our facility her blood pressure was 193/192 and I ordered stat IV Vasotec.  I also ordered stat potassium as she had had some oral potassium repletion in the ER department, stat EKG, and a stat PT and INR.  Review of Systems: Level 5 caveat due to dementia Past Medical History:  Diagnosis Date  . Abnormality of gait 09/08/2014  . Carotid artery occlusion   . Concussion with loss of consciousness 09/08/2014  . DJD (degenerative joint  disease) of cervical spine   . Hyperlipidemia   . Hypertension   . Memory disturbance 03/29/2013  . Osteoporosis   . Personal history of colonic polyps   . RLS (restless legs syndrome)   . Sleep disorder   . TIA (transient ischemic attack)   . Varicose veins     Past Surgical History:  Procedure Laterality Date  . ANTERIOR AND POSTERIOR VAGINAL REPAIR W/ SACROSPINOUS LIGAMENT SUSPENSION    . BREAST REDUCTION SURGERY    . CESAREAN SECTION    . ENDOVENOUS ABLATION SAPHENOUS VEIN W/ LASER    . HEMORRHOID SURGERY      Social History   Social History Narrative   Lives at home w/ her daughter   Right-handed   Caffeine: coffee in the morning and Coke during the day     reports that she quit smoking about 37 years ago. She has never used smokeless tobacco. She reports that she does not drink alcohol or use drugs.  Allergies  Allergen Reactions  . Aspirin Other (See Comments)    Makes blood too thin  . Codeine Nausea Only    Family History  Problem Relation Age of Onset  . Mental illness Mother   . Alzheimer's disease Mother   . Cancer Father   . Bone cancer Father   . Dementia Sister      Prior to Admission medications   Medication Sig Start  Date End Date Taking? Authorizing Provider  amLODipine (NORVASC) 10 MG tablet Take 1 tablet (10 mg total) by mouth daily. 08/28/17   Shelly Coss, MD  hydrOXYzine (ATARAX/VISTARIL) 25 MG tablet Take 1 tablet (25 mg total) by mouth 3 (three) times daily as needed. 09/21/17   Ward Givens, NP  lisinopril (PRINIVIL,ZESTRIL) 40 MG tablet Take 40 mg by mouth daily.    [provider]  memantine (NAMENDA XR) 28 MG CP24 24 hr capsule Take 1 capsule (28 mg total) by mouth daily. 09/21/17   Ward Givens, NP  Probiotic Product (PROBIOTIC PEARLS PO) Take 1 tablet by mouth daily.    [provider]  QUEtiapine (SEROQUEL) 25 MG tablet Take 0.5 tablets (12.5 mg total) by mouth 2 (two) times daily. 09/21/17   Ward Givens,  NP  sertraline (ZOLOFT) 100 MG tablet Take 1 tablet (100 mg total) by mouth daily. 09/21/17   Ward Givens, NP  traZODone (DESYREL) 50 MG tablet Take 1 tablet (50 mg total) by mouth at bedtime. 09/21/17   Ward Givens, NP    Physical Exam:  Constitutional: NAD, calm, comfortable, lying in bed with both her knees up. Vitals:   10/18/17 0222 10/18/17 0426 10/18/17 0640 10/18/17 0900  BP:  (!) 144/77 (!) 149/76 (!) 193/92  Pulse: 78 73 74 79  Resp: 16 16 16    Temp: 98.4 F (36.9 C) 98.3 F (36.8 C) 98.2 F (36.8 C) 98.4 F (36.9 C)  TempSrc: Oral Oral Oral Oral  SpO2: 97% 96% 96% (!) 87%  Weight:       Eyes: PERRL, lids and conjunctivae normal ENMT: Mucous membranes are moist. Posterior pharynx clear of any exudate or lesions.Normal dentition.  Neck: normal, supple, no masses, no thyromegaly Respiratory: clear to auscultation bilaterally, no wheezing, no crackles. Normal respiratory effort. No accessory muscle use.  Cardiovascular: Regular rate and rhythm, no murmurs / rubs / gallops. No extremity edema. 2+ pedal pulses. No carotid bruits.  Abdomen: no tenderness, no masses palpated. No hepatosplenomegaly. Bowel sounds positive.  Musculoskeletal: no clubbing / cyanosis. No joint deformity upper and lower extremities. Good ROM, no contractures. Normal muscle tone.  Skin: no rashes, ulcers. No induration.  Small bruise over the right side of the back Neurologic: CN 2-12 grossly intact. Sensation intact, DTR normal. Strength 5/5 in all 4.  Psychiatric: Alert to name and city but not to day date time location or situation   Labs on Admission: I have personally reviewed following labs and imaging studies  CBC: Recent Labs  Lab 10/18/17 0410  WBC 11.2*  NEUTROABS 9.4*  HGB 15.7*  HCT 42.4  MCV 80.6  PLT 119   Basic Metabolic Panel: Recent Labs  Lab 10/18/17 0410  NA 137  K 2.8*  CL 92*  CO2 29  GLUCOSE 179*  BUN 9  CREATININE 0.71  CALCIUM 9.2   Urine analysis:     Component Value Date/Time   COLORURINE YELLOW 08/25/2017 2008   APPEARANCEUR CLEAR 08/25/2017 2008   LABSPEC 1.013 08/25/2017 2008   PHURINE 6.0 08/25/2017 2008   GLUCOSEU NEGATIVE 08/25/2017 2008   HGBUR NEGATIVE 08/25/2017 2008   [place] NEGATIVE 08/25/2017 2008   [place] 08/25/2017 2008   PROTEINUR NEGATIVE 08/25/2017 2008   UROBILINOGEN 1.0 09/30/2014 1211   NITRITE NEGATIVE 08/25/2017 2008   LEUKOCYTESUR SMALL (A) 08/25/2017 2008    Radiological Exams on Admission: Dg Chest 1 View  Result Date: 10/18/2017 CLINICAL DATA:  Hip fracture, fall EXAM: CHEST  1 VIEW  COMPARISON:  08/25/2017 FINDINGS: Low lung volumes. Probable small left pleural effusion with basilar atelectasis. Cardiomegaly with aortic atherosclerosis. No pneumothorax. IMPRESSION: Trace left pleural effusion with left basilar atelectasis. Borderline to mild cardiomegaly. Electronically Signed   By: Donavan Foil M.D.   On: 10/18/2017 03:42   Dg Hip Unilat W Or Wo Pelvis 1 View Left  Result Date: 10/18/2017 CLINICAL DATA:  Fall with hip pain EXAM: DG HIP (WITH OR WITHOUT PELVIS) 1V*L* COMPARISON:  None. FINDINGS: SI joint degenerative changes. Pubic symphysis and rami are intact. Acute mildly displaced left femoral neck fracture. No femoral head dislocation. IMPRESSION: Acute mildly displaced left femoral neck fracture. Electronically Signed   By: Donavan Foil M.D.   On: 10/18/2017 03:41   Dg Femur Min 2 Views Left  Result Date: 10/18/2017 CLINICAL DATA:  Hip fracture, fall EXAM: LEFT FEMUR 2 VIEWS COMPARISON:  None. FINDINGS: Mid to distal femur show no fracture or malalignment. Atherosclerotic vascular disease. Soft tissues are unremarkable IMPRESSION: Negative. Electronically Signed   By: Donavan Foil M.D.   On: 10/18/2017 03:41    EKG: Ordered stat and pending  Assessment/Plan Principal Problem:   Left displaced femoral neck fracture (HCC) Active Problems:   Accelerated hypertension    Hypokalemia   DJD (degenerative joint disease) of cervical spine   Abnormality of gait   Dementia   Subdural hematoma (HCC)   Hyperlipidemia   RLS (restless legs syndrome)     1.  Left displaced femoral neck fracture: Dr. Erlinda Hong from the pubic surgery plans to operate on the patient today.  She appears to be relatively stable for surgery unfortunately I do not have an EKG to check at this point.  She was slightly hypokalemic and that has been repleted orally we are repeating the potassium level which has been sent to the lab and is ordered stat.  Patient will likely need physical therapy and occupational therapy after recovery possibly in a skilled facility but perhaps at home she seems to have sufficient support at home.  2.  Accelerated hypertension: Blood pressure very elevated at 193/192.  Stat Vasotec ordered upon patient's arrival which she has received her repeat blood pressure per the nurse was systolic.  Blood pressure should improve when she is able to take her home medications.  3.  Hypokalemia: Stat potassium given in the emergency department at 5 AM.  Repeat potassium is pending.  4.  DJD of the cervical spine: Patient with some mild degenerative disc disease noted at C5-6 and C6-7 in 2016.  Continue home pain medication regimen.  5.  Abnormality of gait.  Patient is unstable on her ambulation.  She will likely need a walker even after surgery.  6.  Dementia of moderate to severe degree: Patient is at high risk of delirium we will continue her home medications and provide frequent redirection.  7.  Subdural hematoma in the remote past: Patient had a subdural hematoma which is small in February 2016 it occurred after she was outside walking the dog on a gravel walk.  Repeat imaging since that time has shown improvement.  8.  Hyperlipidemia: Continue home dietary management.  At this point as she has had no stroke no medication is indicated.  9.  Restless leg syndrome: She is on no  dedicated medication for restless legs but she does take Namenda, quite a pain, and Zoloft.  10.  Reported sensitivity to aspirin: The patient has never had GI bleeding or any serious bleeding associated with aspirin.  Her daughter said that they simply noticed that she bled more if she got cut.  This is not a contraindication aspirin.  DVT prophylaxis: Lovenox and SCDs Code Status: DO NOT RESUSCITATE Family Communication: Spoke with patient's daughter Philemon Kingdom who is her power of attorney who was present in the room on admission.  Her telephone number [phone] Disposition Plan: Home with 24-hour assistance or skilled nursing facility in 3 to 4 days Consults called: Dr. Rhunette Croft you with orthopedics Admission status: Inpatient   Lady Deutscher MD [facility] Pager [phone]  If 7PM-7AM, please contact night-coverage [email or web address] Password [email or web address]  10/18/2017, 10:36 AM

## 2017-10-18 NOTE — Anesthesia Preprocedure Evaluation (Addendum)
Anesthesia Evaluation  Patient identified by MRN, date of birth, ID band Patient awake    Reviewed: Allergy & Precautions, NPO status , Patient's Chart, lab work & pertinent test results  Airway Mallampati: II  TM Distance: >3 FB Neck ROM: Full    Dental no notable dental hx.    Pulmonary neg pulmonary ROS, former smoker,    Pulmonary exam normal breath sounds clear to auscultation       Cardiovascular hypertension, Normal cardiovascular exam Rhythm:Regular Rate:Normal     Neuro/Psych PSYCHIATRIC DISORDERS Dementia severe dementiaTIA   GI/Hepatic negative GI ROS, Neg liver ROS,   Endo/Other  negative endocrine ROS  Renal/GU negative Renal ROS  negative genitourinary   Musculoskeletal negative musculoskeletal ROS (+) Arthritis ,   Abdominal   Peds negative pediatric ROS (+)  Hematology negative hematology ROS (+)   Anesthesia Other Findings - HLD - RLS   Reproductive/Obstetrics negative OB ROS                           Anesthesia Physical Anesthesia Plan  ASA: III  Anesthesia Plan: General   Post-op Pain Management:    Induction: Intravenous  PONV Risk Score and Plan: 4 or greater and Ondansetron, Dexamethasone and Treatment may vary due to age or medical condition  Airway Management Planned: Oral ETT  Additional Equipment: None  Intra-op Plan:   Post-operative Plan: Extubation in OR  Informed Consent: I have reviewed the patients History and Physical, chart, labs and discussed the procedure including the risks, benefits and alternatives for the proposed anesthesia with the patient or authorized representative who has indicated his/her understanding and acceptance.   Dental advisory given  Plan Discussed with: CRNA and Surgeon  Anesthesia Plan Comments:       Anesthesia Quick Evaluation

## 2017-10-18 NOTE — ED Provider Notes (Signed)
[facility] EMERGENCY DEPARTMENT Provider Note   CSN: [id number] Arrival date & time: 10/18/17  0010     History   Chief Complaint Chief Complaint  Patient presents with  . Fall    HPI Heather Floyd is a 84 y.o. female.  HPI  This is an 84 year old female with a history of hypertension, hyperlipidemia, severe dementia who presents with a fall.  History is provided by the patient's daughter as patient is unable to contribute to history taking.  She had a witnessed mechanical fall earlier today.  She was at the front door and fell backwards hitting her right back on the doorknob and falling onto her left hip.  At that time she complained of some pain.  Her daughters were able to get her up and into bed.  At some point, patient attempted to get up on her own again and fell.  Daughter does not believe she hit her head.  She was awake on arrival.  She is complaining of left leg pain.  Also noted to have a bruise over the right back.    She is only oriented to herself.  She could not tell me if she hurts anywhere.  Level 5 caveat for dementia.  Past Medical History:  Diagnosis Date  . Abnormality of gait 09/08/2014  . Carotid artery occlusion   . Concussion with loss of consciousness 09/08/2014  . DJD (degenerative joint disease) of cervical spine   . Hyperlipidemia   . Hypertension   . Memory disturbance 03/29/2013  . Osteoporosis   . Personal history of colonic polyps   . RLS (restless legs syndrome)   . Sleep disorder   . TIA (transient ischemic attack)   . Varicose veins     Patient Active Problem List   Diagnosis Date Noted  . Closed left hip fracture (HCC) 10/18/2017  . Colitis 08/25/2017  . Abdominal pain 04/08/2017  . Leukocytosis 04/08/2017  . Dementia 04/08/2017  . Dehydration 04/08/2017  . Abnormal urinalysis 04/08/2017  . Enteritis 04/07/2017  . Abnormality of gait 09/08/2014  . Concussion with loss of consciousness 09/08/2014  . Subdural hematoma  (HCC) 08/11/2014  . Hypokalemia 08/11/2014  . Patellar fracture 08/11/2014  . SDH (subdural hematoma) (HCC) 08/11/2014  . Hyperlipidemia   . Hypertension   . RLS (restless legs syndrome)   . DJD (degenerative joint disease) of cervical spine   . TIA (transient ischemic attack)   . Carotid artery occlusion   . Essential hypertension   . Memory disturbance 03/29/2013  . Near syncope 01/13/2013  . Accelerated hypertension 01/13/2013  . Hyperkalemia 01/13/2013    Past Surgical History:  Procedure Laterality Date  . ANTERIOR AND POSTERIOR VAGINAL REPAIR W/ SACROSPINOUS LIGAMENT SUSPENSION    . BREAST REDUCTION SURGERY    . CESAREAN SECTION    . ENDOVENOUS ABLATION SAPHENOUS VEIN W/ LASER    . HEMORRHOID SURGERY       OB History   None      Home Medications    Prior to Admission medications   Medication Sig Start Date End Date Taking? Authorizing Provider  amLODipine (NORVASC) 10 MG tablet Take 1 tablet (10 mg total) by mouth daily. 08/28/17   Burnadette Pop, MD  hydrOXYzine (ATARAX/VISTARIL) 25 MG tablet Take 1 tablet (25 mg total) by mouth 3 (three) times daily as needed. 09/21/17   Butch Penny, NP  lisinopril (PRINIVIL,ZESTRIL) 40 MG tablet Take 40 mg by mouth daily.    [provider]  memantine (NAMENDA XR) 28 MG CP24 24 hr capsule Take 1 capsule (28 mg total) by mouth daily. 09/21/17   Butch Penny, NP  Probiotic Product (PROBIOTIC PEARLS PO) Take 1 tablet by mouth daily.    [provider]  QUEtiapine (SEROQUEL) 25 MG tablet Take 0.5 tablets (12.5 mg total) by mouth 2 (two) times daily. 09/21/17   Butch Penny, NP  sertraline (ZOLOFT) 100 MG tablet Take 1 tablet (100 mg total) by mouth daily. 09/21/17   Butch Penny, NP  traZODone (DESYREL) 50 MG tablet Take 1 tablet (50 mg total) by mouth at bedtime. 09/21/17   Butch Penny, NP    Family History Family History  Problem Relation Age of Onset  . Mental illness Mother   . Alzheimer's disease  Mother   . Cancer Father   . Bone cancer Father   . Dementia Sister     Social History Social History   Tobacco Use  . Smoking status: Former Smoker    Last attempt to quit: 06/16/1980    Years since quitting: 37.3  . Smokeless tobacco: Never Used  Substance Use Topics  . Alcohol use: No  . Drug use: No     Allergies   Aspirin and Codeine   Review of Systems Review of Systems  Unable to perform ROS: Dementia     Physical Exam Updated Vital Signs BP (!) 144/77 (BP Location: Left Arm)   Pulse 73   Temp 98.3 F (36.8 C) (Oral)   Resp 16   Wt 49 kg (108 lb)   SpO2 96%   BMI 19.75 kg/m   Physical Exam  Constitutional:  Elderly, nontoxic-appearing, no acute distress  HENT:  Head: Normocephalic and atraumatic.  Eyes: Pupils are equal, round, and reactive to light.  Neck: Normal range of motion. Neck supple.  No midline C-spine tenderness to palpation  Cardiovascular: Normal rate, regular rhythm and normal heart sounds.  Pulmonary/Chest: Effort normal and breath sounds normal. No respiratory distress. She has no wheezes.  Abdominal: Soft. Bowel sounds are normal. There is no tenderness.  Musculoskeletal:  Pain with range of motion of the left hip and knee, no obvious foreshortening noted, 2+ DP pulse  Neurological: She is alert.  Oriented only to self, follows simple commands  Skin: Skin is warm and dry.  Large bruise over right flank  Psychiatric: She has a normal mood and affect.  Nursing note and vitals reviewed.    ED Treatments / Results  Labs (all labs ordered are listed, but only abnormal results are displayed) Labs Reviewed  CBC WITH DIFFERENTIAL/PLATELET - Abnormal; Notable for the following components:      Result Value   WBC 11.2 (*)    RBC 5.26 (*)    Hemoglobin 15.7 (*)    MCHC 37.0 (*)    Neutro Abs 9.4 (*)    All other components within normal limits  BASIC METABOLIC PANEL - Abnormal; Notable for the following components:   Potassium  2.8 (*)    Chloride 92 (*)    Glucose, Bld 179 (*)    Anion gap 16 (*)    All other components within normal limits    EKG None  Radiology Dg Chest 1 View  Result Date: 10/18/2017 CLINICAL DATA:  Hip fracture, fall EXAM: CHEST  1 VIEW COMPARISON:  08/25/2017 FINDINGS: Low lung volumes. Probable small left pleural effusion with basilar atelectasis. Cardiomegaly with aortic atherosclerosis. No pneumothorax. IMPRESSION: Trace left pleural effusion with left basilar atelectasis. Borderline to  mild cardiomegaly. Electronically Signed   By: Jasmine Pang M.D.   On: 10/18/2017 03:42   Dg Hip Unilat W Or Wo Pelvis 1 View Left  Result Date: 10/18/2017 CLINICAL DATA:  Fall with hip pain EXAM: DG HIP (WITH OR WITHOUT PELVIS) 1V*L* COMPARISON:  None. FINDINGS: SI joint degenerative changes. Pubic symphysis and rami are intact. Acute mildly displaced left femoral neck fracture. No femoral head dislocation. IMPRESSION: Acute mildly displaced left femoral neck fracture. Electronically Signed   By: Jasmine Pang M.D.   On: 10/18/2017 03:41   Dg Femur Min 2 Views Left  Result Date: 10/18/2017 CLINICAL DATA:  Hip fracture, fall EXAM: LEFT FEMUR 2 VIEWS COMPARISON:  None. FINDINGS: Mid to distal femur show no fracture or malalignment. Atherosclerotic vascular disease. Soft tissues are unremarkable IMPRESSION: Negative. Electronically Signed   By: Jasmine Pang M.D.   On: 10/18/2017 03:41    Procedures Procedures (including critical care time)  Medications Ordered in ED Medications  sodium chloride 0.9 % bolus 1,000 mL (1,000 mLs Intravenous New Bag/Given 10/18/17 0442)  HYDROcodone-acetaminophen (HYCET) 7.5-325 mg/15 ml solution 10 mL (10 mLs Oral Given 10/18/17 0324)     Initial Impression / Assessment and Plan / ED Course  I have reviewed the triage vital signs and the nursing notes.  Pertinent labs & imaging results that were available during my care of the patient were reviewed by me and considered in  my medical decision making (see chart for details).     Patient presents after a fall.  She is demented and unable to provide history.  She has a bruise over her right flank and pain with range of motion of the left hip and knee.  She otherwise nontoxic-appearing.  No evidence of head trauma.  X-rays are notable for a mildly displaced femoral neck fracture.  This was discussed with Dr. Roda Shutters who requested patient be transferred to [facility].  I discussed this with the hospitalist.  Of note, family notes history of hypokalemia.  They are resistant to IV replacement.  Will order some p.o.  Additionally, but family is concerned as patient has previously required a sitter while in the hospital as she will get up without any warning.  Family is redirecting her at the bedside multiple times.  This was also discussed with the admitting hospitalist.  I had a long discussion with the family regarding likely next steps.  Patient is has high mortality.  At baseline she is DNR.  Final Clinical Impressions(s) / ED Diagnoses   Final diagnoses:  Closed fracture of left hip, initial encounter [facility])  Hypokalemia    ED Discharge Orders    None       Shon Baton, MD 10/18/17 [phone]

## 2017-10-18 NOTE — ED Notes (Signed)
ED Provider at bedside. 

## 2017-10-18 NOTE — Progress Notes (Signed)
PT Cancellation Note  Patient Details Name: REALITY DEJONGE MRN: [id number] DOB: 04-Nov-1932   Cancelled Treatment:    Reason Eval/Treat Not Completed: Active bedrest order;Other (comment)(Bedrest preop; Will plan for PT eval postop); Will need orders for weight bearing status postop;  Thank you,  Van Clines, PT  Acute Rehabilitation Services Pager [phone] Office [phone]    Levi Aland 10/18/2017, 10:31 AM

## 2017-10-18 NOTE — Consult Note (Signed)
Consultation Note Date: 10/18/2017   Patient Name: Heather Floyd  DOB: 08-24-1932  MRN: [id number]  Age / Sex: 84 y.o., female  PCP: Lawerance Cruel, MD Referring Physician: Lady Deutscher, MD  Reason for Consultation: Establishing goals of care  HPI/Patient Profile: 84 y.o. female  with past medical history of dementia, HTN, HLD, RLS, DJD, and h/o spinal compression fracture, who was last hospitalized 08/2017 with colitis and is now admitted on 10/18/2017 with L. Femoral neck fracture after a mechanical fall at home. She has been evaluated by orthopedics and is pending surgical repair on 5/6. Palliative care has been asked to help clarify goals and ensure patient is comfortable.   Clinical Assessment and Goals of Care: I met with daughter, Randall Hiss, with whom patient lives. She says that at baseline, patient is ambulatory without assistance, although she has become increasingly weaker and has fallen twice in past month. Patient needs assistance with ADLs but is able to feed herself. She is incontinent of both bowel and bladder. Patient is normally conversant with family but can only say a few nonsensical words at a time. FAST score is likely 7B.   Daughter says she recognizes that patient might not return to her previous baseline and that this injury could herald further decline. Disposition is yet unclear. I would recommend palliative care follow if patient is discharged to rehab. Otherwise, patient might benefit from home health physical therapy vs hospice. Patient's dementia might significantly impair her ability to participate in therapy process.   Patient is currently having some pain and nurse has just given Norco. Patient also has hydromorphone ordered prn.   Patient is a DNR. Daughter, [name], is her HCPOA. However, family say they make decisions collectively. There are two daughters and a son.  SUMMARY  OF RECOMMENDATIONS   1. Continue supportive care 2. Agree with pain regimen 3. DNR 4. Family will need further conversations postop regarding disposition and options     Primary Diagnoses: Present on Admission: . Left displaced femoral neck fracture ([place]) . Accelerated hypertension . Subdural hematoma ([place]) . Hyperlipidemia . RLS (restless legs syndrome) . DJD (degenerative joint disease) of cervical spine . Hypokalemia . Dementia   I have reviewed the medical record, interviewed the patient and family, and examined the patient. The following aspects are pertinent.  Past Medical History:  Diagnosis Date  . Abnormality of gait 09/08/2014  . Carotid artery occlusion   . Concussion with loss of consciousness 09/08/2014  . DJD (degenerative joint disease) of cervical spine   . Hyperlipidemia   . Hypertension   . Memory disturbance 03/29/2013  . Osteoporosis   . Personal history of colonic polyps   . RLS (restless legs syndrome)   . Sleep disorder   . TIA (transient ischemic attack)   . Varicose veins    Social History   Socioeconomic History  . Marital status: Widowed    Spouse name: Not on file  . Number of children: 3  . Years of education: 19  .  Highest education level: Not on file  Occupational History  . Occupation: retired  Scientific laboratory technician  . Financial resource strain: Not on file  . Food insecurity:    Worry: Not on file    Inability: Not on file  . Transportation needs:    Medical: Not on file    Non-medical: Not on file  Tobacco Use  . Smoking status: Former Smoker    Last attempt to quit: 06/16/1980    Years since quitting: 37.3  . Smokeless tobacco: Never Used  Substance and Sexual Activity  . Alcohol use: No  . Drug use: No  . Sexual activity: Not on file  Lifestyle  . Physical activity:    Days per week: Not on file    Minutes per session: Not on file  . Stress: Not on file  Relationships  . Social connections:    Talks on phone: Not on file      Gets together: Not on file    Attends religious service: Not on file    Active member of club or organization: Not on file    Attends meetings of clubs or organizations: Not on file    Relationship status: Not on file  Other Topics Concern  . Not on file  Social History Narrative   Lives at home w/ her daughter   Right-handed   Caffeine: coffee in the morning and Coke during the day   Family History  Problem Relation Age of Onset  . Mental illness Mother   . Alzheimer's disease Mother   . Cancer Father   . Bone cancer Father   . Dementia Sister    Scheduled Meds: . amLODipine  10 mg Oral Daily  . docusate sodium  100 mg Oral BID  . enoxaparin (LOVENOX) injection  40 mg Subcutaneous Q24H  . lisinopril  40 mg Oral Daily  . memantine  28 mg Oral Daily  . potassium chloride  40 mEq Oral TID  . PROBIOTIC PEARLS   Oral Daily  . QUEtiapine  12.5 mg Oral BID  . sertraline  100 mg Oral Daily  . tranexamic acid (CYKLOKAPRON) topical -INTRAOP  2,000 mg Topical Once  . traZODone  50 mg Oral QHS   Continuous Infusions: . 0.9 % NaCl with KCl 40 mEq / L     PRN Meds:.bisacodyl, HYDROcodone-acetaminophen, HYDROmorphone (DILAUDID) injection, hydrOXYzine, ondansetron (ZOFRAN) IV, sodium phosphate Medications Prior to Admission:  Prior to Admission medications   Medication Sig Start Date End Date Taking? Authorizing Provider  acetaminophen (TYLENOL) 160 MG/5ML solution Take 480 mg by mouth every 6 (six) hours as needed for moderate pain.   Yes [provider]  amLODipine (NORVASC) 10 MG tablet Take 1 tablet (10 mg total) by mouth daily. Patient taking differently: Take 5 mg by mouth daily.  08/28/17  Yes Shelly Coss, MD  hydrOXYzine (ATARAX/VISTARIL) 25 MG tablet Take 1 tablet (25 mg total) by mouth 3 (three) times daily as needed. Patient taking differently: Take 25 mg by mouth 3 (three) times daily as needed for anxiety.  09/21/17  Yes Ward Givens, NP  lisinopril  (PRINIVIL,ZESTRIL) 40 MG tablet Take 40 mg by mouth daily.   Yes [provider]  memantine (NAMENDA XR) 28 MG CP24 24 hr capsule Take 1 capsule (28 mg total) by mouth daily. 09/21/17  Yes Ward Givens, NP  Probiotic Product (PROBIOTIC PEARLS PO) Take 1 tablet by mouth daily.   Yes [provider]  QUEtiapine (SEROQUEL) 25 MG tablet Take  0.5 tablets (12.5 mg total) by mouth 2 (two) times daily. Patient taking differently: Take 25 mg by mouth at bedtime.  09/21/17  Yes Ward Givens, NP  sertraline (ZOLOFT) 100 MG tablet Take 1 tablet (100 mg total) by mouth daily. 09/21/17  Yes Ward Givens, NP  traZODone (DESYREL) 50 MG tablet Take 1 tablet (50 mg total) by mouth at bedtime. 09/21/17  Yes Ward Givens, NP   Allergies  Allergen Reactions  . Aspirin Other (See Comments)    Makes blood too thin  . Codeine Nausea Only   Review of Systems  Unable to perform ROS   Physical Exam  Constitutional:  Ill appearing, frail  Pulmonary/Chest: Effort normal and breath sounds normal.  Abdominal: Soft.  Neurological:  Wakes when stimulated, says a few words, confused  Nursing note and vitals reviewed.   Vital Signs: BP (!) 171/82 (BP Location: Right Arm)   Pulse 79   Temp 98.4 F (36.9 C) (Oral)   Resp 16   Wt 49 kg (108 lb)   SpO2 (!) 87%   BMI 19.75 kg/m  Pain Scale: 0-10   Pain Score: 0-No pain   SpO2: SpO2: (!) 87 % O2 Device:SpO2: (!) 87 % O2 Flow Rate: .   IO: Intake/output summary:   Intake/Output Summary (Last 24 hours) at 10/18/2017 1319 Last data filed at 10/18/2017 5397 Gross per 24 hour  Intake 1000 ml  Output -  Net 1000 ml    LBM:   Baseline Weight: Weight: 49 kg (108 lb) Most recent weight: Weight: 49 kg (108 lb)     Palliative Assessment/Data:   Flowsheet Rows     Most Recent Value  Intake Tab  Referral Department  Hospitalist  Unit at Time of Referral  Med/Surg Unit  Date Notified  10/18/17  Palliative Care Type  New Palliative care   Reason for referral  Clarify Goals of Care  Date of Admission  10/18/17  Date first seen by Palliative Care  10/18/17  # of days Palliative referral response time  0 Day(s)  # of days IP prior to Palliative referral  0  Clinical Assessment  Palliative Performance Scale Score  10%  Psychosocial & Spiritual Assessment  Palliative Care Outcomes  Patient/Family meeting held?  Yes  Who was at the meeting?  daughter  Palliative Care Outcomes  Clarified goals of care      Time In: 6734 Time Out: 1330 Time Total: 45 minutes Greater than 50%  of this time was spent counseling and coordinating care related to the above assessment and plan.  Signed by: Irean Hong, NP   Please contact Palliative Medicine Team phone at [phone] for questions and concerns.  For individual provider: See Shea Evans

## 2017-10-18 NOTE — ED Triage Notes (Signed)
Pt's daughter reports pt fell x 2 this evening. Reports bruise to lower back, pain in right leg, and pt now having difficulty walking

## 2017-10-18 NOTE — OR Nursing (Signed)
Case postponed by Dr. Hart Rochester and Dr. Roda Shutters due to low potassium.

## 2017-10-18 NOTE — Consult Note (Addendum)
ORTHOPAEDIC CONSULTATION  REQUESTING PHYSICIAN: Lahoma Crocker, MD  Chief Complaint: Left femoral neck hip fracture  HPI: Heather Floyd is a 84 y.o. female who presents with left hip fracture s/p mechanical fall PTA at home.  The patient endorses severe pain in the left hip, that does not radiate, grinding in quality, worse with any movement, better with immobilization and was unable to ambulate afterwards.  Denies LOC/fever/chills/nausea/vomiting.  Walks without assistive devices (walker, cane, wheelchair).  Does live with daughters.  Denies LOC, neck pain, abd pain.  Patient has severe dementia.  History obtained from family.  Level 5 caveat for dementia.  Past Medical History:  Diagnosis Date  . Abnormality of gait 09/08/2014  . Carotid artery occlusion   . Concussion with loss of consciousness 09/08/2014  . DJD (degenerative joint disease) of cervical spine   . Hyperlipidemia   . Hypertension   . Memory disturbance 03/29/2013  . Osteoporosis   . Personal history of colonic polyps   . RLS (restless legs syndrome)   . Sleep disorder   . TIA (transient ischemic attack)   . Varicose veins    Past Surgical History:  Procedure Laterality Date  . ANTERIOR AND POSTERIOR VAGINAL REPAIR W/ SACROSPINOUS LIGAMENT SUSPENSION    . BREAST REDUCTION SURGERY    . CESAREAN SECTION    . ENDOVENOUS ABLATION SAPHENOUS VEIN W/ LASER    . HEMORRHOID SURGERY     Social History   Socioeconomic History  . Marital status: Widowed    Spouse name: Not on file  . Number of children: 3  . Years of education: 76  . Highest education level: Not on file  Occupational History  . Occupation: retired  Engineer, production  . Financial resource strain: Not on file  . Food insecurity:    Worry: Not on file    Inability: Not on file  . Transportation needs:    Medical: Not on file    Non-medical: Not on file  Tobacco Use  . Smoking status: Former Smoker    Last attempt to quit: 06/16/1980    Years  since quitting: 37.3  . Smokeless tobacco: Never Used  Substance and Sexual Activity  . Alcohol use: No  . Drug use: No  . Sexual activity: Not on file  Lifestyle  . Physical activity:    Days per week: Not on file    Minutes per session: Not on file  . Stress: Not on file  Relationships  . Social connections:    Talks on phone: Not on file    Gets together: Not on file    Attends religious service: Not on file    Active member of club or organization: Not on file    Attends meetings of clubs or organizations: Not on file    Relationship status: Not on file  Other Topics Concern  . Not on file  Social History Narrative   Lives at home w/ her daughter   Right-handed   Caffeine: coffee in the morning and Coke during the day   Family History  Problem Relation Age of Onset  . Mental illness Mother   . Alzheimer's disease Mother   . Cancer Father   . Bone cancer Father   . Dementia Sister    Allergies  Allergen Reactions  . Aspirin Other (See Comments)    Makes blood too thin  . Codeine Nausea Only   Prior to Admission medications   Medication Sig Start Date End Date  Taking? Authorizing Provider  amLODipine (NORVASC) 10 MG tablet Take 1 tablet (10 mg total) by mouth daily. 08/28/17   Burnadette Pop, MD  hydrOXYzine (ATARAX/VISTARIL) 25 MG tablet Take 1 tablet (25 mg total) by mouth 3 (three) times daily as needed. 09/21/17   Butch Penny, NP  lisinopril (PRINIVIL,ZESTRIL) 40 MG tablet Take 40 mg by mouth daily.    [provider]  memantine (NAMENDA XR) 28 MG CP24 24 hr capsule Take 1 capsule (28 mg total) by mouth daily. 09/21/17   Butch Penny, NP  Probiotic Product (PROBIOTIC PEARLS PO) Take 1 tablet by mouth daily.    [provider]  QUEtiapine (SEROQUEL) 25 MG tablet Take 0.5 tablets (12.5 mg total) by mouth 2 (two) times daily. 09/21/17   Butch Penny, NP  sertraline (ZOLOFT) 100 MG tablet Take 1 tablet (100 mg total) by mouth daily. 09/21/17    Butch Penny, NP  traZODone (DESYREL) 50 MG tablet Take 1 tablet (50 mg total) by mouth at bedtime. 09/21/17   Butch Penny, NP   Dg Chest 1 View  Result Date: 10/18/2017 CLINICAL DATA:  Hip fracture, fall EXAM: CHEST  1 VIEW COMPARISON:  08/25/2017 FINDINGS: Low lung volumes. Probable small left pleural effusion with basilar atelectasis. Cardiomegaly with aortic atherosclerosis. No pneumothorax. IMPRESSION: Trace left pleural effusion with left basilar atelectasis. Borderline to mild cardiomegaly. Electronically Signed   By: Jasmine Pang M.D.   On: 10/18/2017 03:42   Dg Hip Unilat W Or Wo Pelvis 1 View Left  Result Date: 10/18/2017 CLINICAL DATA:  Fall with hip pain EXAM: DG HIP (WITH OR WITHOUT PELVIS) 1V*L* COMPARISON:  None. FINDINGS: SI joint degenerative changes. Pubic symphysis and rami are intact. Acute mildly displaced left femoral neck fracture. No femoral head dislocation. IMPRESSION: Acute mildly displaced left femoral neck fracture. Electronically Signed   By: Jasmine Pang M.D.   On: 10/18/2017 03:41   Dg Femur Min 2 Views Left  Result Date: 10/18/2017 CLINICAL DATA:  Hip fracture, fall EXAM: LEFT FEMUR 2 VIEWS COMPARISON:  None. FINDINGS: Mid to distal femur show no fracture or malalignment. Atherosclerotic vascular disease. Soft tissues are unremarkable IMPRESSION: Negative. Electronically Signed   By: Jasmine Pang M.D.   On: 10/18/2017 03:41    All pertinent xrays, MRI, CT independently reviewed and interpreted  Positive ROS: All other systems have been reviewed and were otherwise negative with the exception of those mentioned in the HPI and as above.  Physical Exam: General: Alert, no acute distress Cardiovascular: No pedal edema Respiratory: No cyanosis, no use of accessory musculature GI: No organomegaly, abdomen is soft and non-tender Skin: No lesions in the area of chief complaint Neurologic: Sensation intact distally Psychiatric: Patient is competent for consent  with normal mood and affect Lymphatic: No axillary or cervical lymphadenopathy  MUSCULOSKELETAL:  - pain with movement of the hip and extremity - skin intact - NVI distally - compartments soft  Assessment: Left femoral neck hip fracture  Plan: - partial hip replacement is recommended, patient and family are aware of r/b/a and wish to proceed - consent obtained - medical optimization per primary team - surgery is planned for tomorrow after potassium is within acceptable range for anesthesia  Thank you for the consult and the opportunity to see Heather Floyd  N. Glee Arvin, MD [facility] [phone] 11:10 AM

## 2017-10-19 ENCOUNTER — Inpatient Hospital Stay (HOSPITAL_COMMUNITY): Payer: Medicare Other

## 2017-10-19 ENCOUNTER — Encounter (HOSPITAL_COMMUNITY)
Admission: EM | Disposition: A | Discharge status: Skilled Nursing Facility | Payer: Self-pay | Source: Home / Self Care | Attending: Family Medicine

## 2017-10-19 ENCOUNTER — Inpatient Hospital Stay (HOSPITAL_COMMUNITY): Payer: Medicare Other | Admitting: Certified Registered"

## 2017-10-19 DIAGNOSIS — S72002A Fracture of unspecified part of neck of left femur, initial encounter for closed fracture: Secondary | ICD-10-CM

## 2017-10-19 HISTORY — PX: TOTAL HIP ARTHROPLASTY: SHX124

## 2017-10-19 LAB — CBC
HCT: 36.1 % (ref 36.0–46.0)
HEMOGLOBIN: 12.5 g/dL (ref 12.0–15.0)
MCH: 29.4 pg (ref 26.0–34.0)
MCHC: 34.6 g/dL (ref 30.0–36.0)
MCV: 84.9 fL (ref 78.0–100.0)
PLATELETS: 162 10*3/uL (ref 150–400)
RBC: 4.25 MIL/uL (ref 3.87–5.11)
RDW: 14.1 % (ref 11.5–15.5)
WBC: 8.8 10*3/uL (ref 4.0–10.5)

## 2017-10-19 LAB — URINE CULTURE
CULTURE: NO GROWTH
CULTURE: NO GROWTH

## 2017-10-19 LAB — BASIC METABOLIC PANEL
Anion gap: 9 (ref 5–15)
BUN: 7 mg/dL (ref 6–20)
CALCIUM: 8 mg/dL — AB (ref 8.9–10.3)
CO2: 32 mmol/L (ref 22–32)
CREATININE: 0.79 mg/dL (ref 0.44–1.00)
Chloride: 99 mmol/L — ABNORMAL LOW (ref 101–111)
Glucose, Bld: 95 mg/dL (ref 65–99)
Potassium: 3.1 mmol/L — ABNORMAL LOW (ref 3.5–5.1)
SODIUM: 140 mmol/L (ref 135–145)

## 2017-10-19 LAB — VITAMIN D 25 HYDROXY (VIT D DEFICIENCY, FRACTURES): Vit D, 25-Hydroxy: 29.4 ng/mL — ABNORMAL LOW (ref 30.0–100.0)

## 2017-10-19 SURGERY — ARTHROPLASTY, HIP, TOTAL, ANTERIOR APPROACH
Anesthesia: General | Site: Hip | Laterality: Left

## 2017-10-19 MED ORDER — ALUM & MAG HYDROXIDE-SIMETH 200-200-20 MG/5ML PO SUSP: 30.0000 mL | ORAL | Status: DC | PRN

## 2017-10-19 MED ORDER — LACTATED RINGERS IV SOLN
INTRAVENOUS | Status: DC
  Administered 2017-10-19: 14:00:00 via INTRAVENOUS

## 2017-10-19 MED ORDER — LIDOCAINE HCL (CARDIAC) PF 100 MG/5ML IV SOSY
PREFILLED_SYRINGE | INTRAVENOUS | Status: DC | PRN
  Administered 2017-10-19: 40 mg via INTRAVENOUS

## 2017-10-19 MED ORDER — MORPHINE SULFATE (PF) 4 MG/ML IV SOLN: 1.0000 mg | INTRAVENOUS | Status: DC | PRN

## 2017-10-19 MED ORDER — KETOROLAC TROMETHAMINE 15 MG/ML IJ SOLN
7.5000 mg | Freq: Four times a day (QID) | INTRAMUSCULAR | Status: AC
  Administered 2017-10-19 – 2017-10-20 (×4): 7.5 mg via INTRAVENOUS
  Filled 2017-10-19 (×4): qty 1

## 2017-10-19 MED ORDER — VANCOMYCIN HCL 1000 MG IV SOLR
INTRAVENOUS | Status: AC
  Filled 2017-10-19: qty 1000

## 2017-10-19 MED ORDER — SUGAMMADEX SODIUM 200 MG/2ML IV SOLN
INTRAVENOUS | Status: DC | PRN
  Administered 2017-10-19: 100 mg via INTRAVENOUS

## 2017-10-19 MED ORDER — LABETALOL HCL 5 MG/ML IV SOLN
INTRAVENOUS | Status: DC | PRN
  Administered 2017-10-19 (×2): 2.5 mg via INTRAVENOUS

## 2017-10-19 MED ORDER — ONDANSETRON HCL 4 MG/2ML IJ SOLN: 4.0000 mg | Freq: Four times a day (QID) | INTRAMUSCULAR | Status: DC | PRN

## 2017-10-19 MED ORDER — HYDROCODONE-ACETAMINOPHEN 7.5-325 MG PO TABS: 1.0000 | ORAL_TABLET | ORAL | Status: DC | PRN

## 2017-10-19 MED ORDER — PHENYLEPHRINE HCL 10 MG/ML IJ SOLN
INTRAVENOUS | Status: DC | PRN
  Administered 2017-10-19: 15 ug/min via INTRAVENOUS
  Administered 2017-10-19: 10 ug/min via INTRAVENOUS

## 2017-10-19 MED ORDER — ROCURONIUM BROMIDE 100 MG/10ML IV SOLN
INTRAVENOUS | Status: DC | PRN
  Administered 2017-10-19: 20 mg via INTRAVENOUS

## 2017-10-19 MED ORDER — FENTANYL CITRATE (PF) 100 MCG/2ML IJ SOLN
INTRAMUSCULAR | Status: DC | PRN
  Administered 2017-10-19 (×4): 50 ug via INTRAVENOUS

## 2017-10-19 MED ORDER — TRANEXAMIC ACID 1000 MG/10ML IV SOLN
2000.0000 mg | INTRAVENOUS | Status: AC
  Administered 2017-10-19: 2000 mg via TOPICAL
  Filled 2017-10-19: qty 20

## 2017-10-19 MED ORDER — HYDROCODONE-ACETAMINOPHEN 5-325 MG PO TABS
1.0000 | ORAL_TABLET | ORAL | Status: DC | PRN
  Administered 2017-10-22 (×2): 2 via ORAL
  Filled 2017-10-19 (×2): qty 2

## 2017-10-19 MED ORDER — SODIUM CHLORIDE 0.9 % IR SOLN
Status: DC | PRN
  Administered 2017-10-19: 3000 mL

## 2017-10-19 MED ORDER — ACETAMINOPHEN 325 MG PO TABS
325.0000 mg | ORAL_TABLET | Freq: Four times a day (QID) | ORAL | Status: DC | PRN
  Administered 2017-10-21 (×2): 650 mg via ORAL
  Filled 2017-10-19 (×2): qty 2

## 2017-10-19 MED ORDER — PROPOFOL 10 MG/ML IV BOLUS
INTRAVENOUS | Status: AC
  Filled 2017-10-19: qty 20

## 2017-10-19 MED ORDER — 0.9 % SODIUM CHLORIDE (POUR BTL) OPTIME
TOPICAL | Status: DC | PRN
  Administered 2017-10-19: 1000 mL

## 2017-10-19 MED ORDER — METOCLOPRAMIDE HCL 5 MG PO TABS: 5.0000 mg | ORAL_TABLET | Freq: Three times a day (TID) | ORAL | Status: DC | PRN

## 2017-10-19 MED ORDER — SODIUM CHLORIDE 0.9 % IV SOLN
INTRAVENOUS | Status: DC
  Administered 2017-10-20 – 2017-10-22 (×4): via INTRAVENOUS

## 2017-10-19 MED ORDER — PROPOFOL 10 MG/ML IV BOLUS
INTRAVENOUS | Status: DC | PRN
  Administered 2017-10-19 (×2): 20 mg via INTRAVENOUS
  Administered 2017-10-19: 30 mg via INTRAVENOUS
  Administered 2017-10-19: 60 mg via INTRAVENOUS

## 2017-10-19 MED ORDER — POTASSIUM CHLORIDE CRYS ER 20 MEQ PO TBCR
40.0000 meq | EXTENDED_RELEASE_TABLET | Freq: Once | ORAL | Status: AC
  Administered 2017-10-19: 40 meq via ORAL
  Filled 2017-10-19: qty 2

## 2017-10-19 MED ORDER — PROMETHAZINE HCL 25 MG/ML IJ SOLN: 6.2500 mg | INTRAMUSCULAR | Status: DC | PRN

## 2017-10-19 MED ORDER — ENOXAPARIN SODIUM 40 MG/0.4ML ~~LOC~~ SOLN
40.0000 mg | SUBCUTANEOUS | Status: DC
  Administered 2017-10-20 – 2017-10-22 (×2): 40 mg via SUBCUTANEOUS
  Filled 2017-10-19 (×3): qty 0.4

## 2017-10-19 MED ORDER — METOCLOPRAMIDE HCL 5 MG/ML IJ SOLN: 5.0000 mg | Freq: Three times a day (TID) | INTRAMUSCULAR | Status: DC | PRN

## 2017-10-19 MED ORDER — MORPHINE SULFATE (PF) 4 MG/ML IV SOLN
0.5000 mg | INTRAVENOUS | Status: DC | PRN
  Administered 2017-10-21 – 2017-10-22 (×2): 1 mg via INTRAVENOUS
  Filled 2017-10-19 (×2): qty 1

## 2017-10-19 MED ORDER — FENTANYL CITRATE (PF) 250 MCG/5ML IJ SOLN
INTRAMUSCULAR | Status: AC
  Filled 2017-10-19: qty 5

## 2017-10-19 MED ORDER — MENTHOL 3 MG MT LOZG: 1.0000 | LOZENGE | OROMUCOSAL | Status: DC | PRN

## 2017-10-19 MED ORDER — ENSURE ENLIVE PO LIQD: 237.0000 mL | Freq: Three times a day (TID) | ORAL | Status: DC

## 2017-10-19 MED ORDER — PHENOL 1.4 % MT LIQD: 1.0000 | OROMUCOSAL | Status: DC | PRN

## 2017-10-19 MED ORDER — LACTATED RINGERS IV SOLN
INTRAVENOUS | Status: DC | PRN
Start: 2017-10-19 — End: 2017-10-19
  Administered 2017-10-19: 15:00:00 via INTRAVENOUS

## 2017-10-19 MED ORDER — ENOXAPARIN SODIUM 40 MG/0.4ML ~~LOC~~ SOLN: 40.0000 mg | Freq: Every day | SUBCUTANEOUS | 0 refills | Status: DC

## 2017-10-19 MED ORDER — CEFAZOLIN SODIUM-DEXTROSE 2-4 GM/100ML-% IV SOLN
2.0000 g | Freq: Four times a day (QID) | INTRAVENOUS | Status: AC
  Administered 2017-10-19 – 2017-10-20 (×3): 2 g via INTRAVENOUS
  Filled 2017-10-19 (×3): qty 100

## 2017-10-19 MED ORDER — ONDANSETRON HCL 4 MG PO TABS: 4.0000 mg | ORAL_TABLET | Freq: Four times a day (QID) | ORAL | Status: DC | PRN

## 2017-10-19 MED ORDER — DEXAMETHASONE SODIUM PHOSPHATE 10 MG/ML IJ SOLN
INTRAMUSCULAR | Status: DC | PRN
  Administered 2017-10-19: 4 mg via INTRAVENOUS

## 2017-10-19 MED ORDER — HYDROCODONE-ACETAMINOPHEN 7.5-325 MG PO TABS: 1.0000 | ORAL_TABLET | Freq: Four times a day (QID) | ORAL | 0 refills | Status: DC | PRN

## 2017-10-19 MED ORDER — VANCOMYCIN HCL 1000 MG IV SOLR
INTRAVENOUS | Status: DC | PRN
  Administered 2017-10-19: 1000 mg via TOPICAL

## 2017-10-19 MED ORDER — ONDANSETRON HCL 4 MG/2ML IJ SOLN
INTRAMUSCULAR | Status: DC | PRN
  Administered 2017-10-19: 4 mg via INTRAVENOUS

## 2017-10-19 MED ORDER — MAGNESIUM SULFATE IN D5W 1-5 GM/100ML-% IV SOLN
1.0000 g | Freq: Once | INTRAVENOUS | Status: AC
  Administered 2017-10-19: 1 g via INTRAVENOUS
  Filled 2017-10-19: qty 100

## 2017-10-19 MED ORDER — SUCCINYLCHOLINE CHLORIDE 200 MG/10ML IV SOSY
PREFILLED_SYRINGE | INTRAVENOUS | Status: DC | PRN
  Administered 2017-10-19: 80 mg via INTRAVENOUS

## 2017-10-19 MED ORDER — DOCUSATE SODIUM 100 MG PO CAPS
100.0000 mg | ORAL_CAPSULE | Freq: Two times a day (BID) | ORAL | Status: DC
  Administered 2017-10-19 – 2017-10-21 (×3): 100 mg via ORAL
  Filled 2017-10-19 (×4): qty 1

## 2017-10-19 SURGICAL SUPPLY — 50 items
BAG DECANTER FOR FLEXI CONT (MISCELLANEOUS) ×3 IMPLANT
CAPT HIP HEMI 2 ×2 IMPLANT
CELLS DAT CNTRL 66122 CELL SVR (MISCELLANEOUS) ×1 IMPLANT
COVER SURGICAL LIGHT HANDLE (MISCELLANEOUS) ×3 IMPLANT
DRAPE C-ARM 42X72 X-RAY (DRAPES) ×3 IMPLANT
DRAPE POUCH INSTRU U-SHP 10X18 (DRAPES) ×3 IMPLANT
DRAPE STERI IOBAN 125X83 (DRAPES) ×3 IMPLANT
DRAPE U-SHAPE 47X51 STRL (DRAPES) ×6 IMPLANT
DRSG AQUACEL AG ADV 3.5X 6 (GAUZE/BANDAGES/DRESSINGS) ×2 IMPLANT
DURAPREP 26ML APPLICATOR (WOUND CARE) ×3 IMPLANT
ELECT BLADE 4.0 EZ CLEAN MEGAD (MISCELLANEOUS) ×3
ELECT REM PT RETURN 9FT ADLT (ELECTROSURGICAL) ×3
ELECTRODE BLDE 4.0 EZ CLN MEGD (MISCELLANEOUS) ×1 IMPLANT
ELECTRODE REM PT RTRN 9FT ADLT (ELECTROSURGICAL) ×1 IMPLANT
GLOVE BIOGEL PI IND STRL 8 (GLOVE) IMPLANT
GLOVE BIOGEL PI INDICATOR 8 (GLOVE) ×4
GLOVE ECLIPSE 7.0 STRL STRAW (GLOVE) ×3 IMPLANT
GLOVE SKINSENSE NS SZ7.5 (GLOVE) ×2
GLOVE SKINSENSE STRL SZ7.5 (GLOVE) ×1 IMPLANT
GLOVE SURG SS PI 6.5 STRL IVOR (GLOVE) ×6 IMPLANT
GLOVE SURG SS PI 8.0 STRL IVOR (GLOVE) ×8 IMPLANT
GLOVE SURG SYN 7.5  E (GLOVE) ×4
GLOVE SURG SYN 7.5 E (GLOVE) ×2 IMPLANT
GLOVE SURG SYN 7.5 PF PI (GLOVE) ×2 IMPLANT
GOWN SRG XL XLNG 56XLVL 4 (GOWN DISPOSABLE) ×1 IMPLANT
GOWN STRL NON-REIN XL XLG LVL4 (GOWN DISPOSABLE) ×3
GOWN STRL REUS W/ TWL LRG LVL3 (GOWN DISPOSABLE) IMPLANT
GOWN STRL REUS W/TWL LRG LVL3 (GOWN DISPOSABLE) ×3
HANDPIECE INTERPULSE COAX TIP (DISPOSABLE) ×3
HOOD PEEL AWAY FLYTE STAYCOOL (MISCELLANEOUS) ×6 IMPLANT
IV NS IRRIG 3000ML ARTHROMATIC (IV SOLUTION) ×3 IMPLANT
KIT BASIN OR (CUSTOM PROCEDURE TRAY) ×3 IMPLANT
MARKER SKIN DUAL TIP RULER LAB (MISCELLANEOUS) ×3 IMPLANT
PACK TOTAL JOINT (CUSTOM PROCEDURE TRAY) ×3 IMPLANT
PACK UNIVERSAL I (CUSTOM PROCEDURE TRAY) ×3 IMPLANT
RETRACTOR WND ALEXIS 18 MED (MISCELLANEOUS) ×1 IMPLANT
RTRCTR WOUND ALEXIS 18CM MED (MISCELLANEOUS) ×3
SAW OSC TIP CART 19.5X105X1.3 (SAW) ×3 IMPLANT
SET HNDPC FAN SPRY TIP SCT (DISPOSABLE) ×1 IMPLANT
STAPLER VISISTAT 35W (STAPLE) IMPLANT
SUT ETHIBOND 2 V 37 (SUTURE) ×3 IMPLANT
SUT ETHILON 2 0 PSLX (SUTURE) ×2 IMPLANT
SUT VIC AB 0 CT1 27 (SUTURE) ×3
SUT VIC AB 0 CT1 27XBRD ANBCTR (SUTURE) IMPLANT
SUT VIC AB 1 CT1 27 (SUTURE) ×3
SUT VIC AB 1 CT1 27XBRD ANBCTR (SUTURE) ×1 IMPLANT
SUT VIC AB 2-0 CT1 27 (SUTURE) ×3
SUT VIC AB 2-0 CT1 TAPERPNT 27 (SUTURE) ×1 IMPLANT
TOWEL OR 17X26 10 PK STRL BLUE (TOWEL DISPOSABLE) ×3 IMPLANT
YANKAUER SUCT BULB TIP NO VENT (SUCTIONS) ×3 IMPLANT

## 2017-10-19 NOTE — Progress Notes (Signed)
Initial Nutrition Assessment  DOCUMENTATION CODES:   Not applicable  INTERVENTION:   -Once diet is advanced:  Ensure Enlive po TID, each supplement provides 350 kcal and 20 grams of protein  NUTRITION DIAGNOSIS:   Increased nutrient needs related to post-op healing as evidenced by estimated needs.  GOAL:   Patient will meet greater than or equal to 90% of their needs  MONITOR:   PO intake, Supplement acceptance, Diet advancement, Labs, Weight trends, Skin, I & O's  REASON FOR ASSESSMENT:   Consult Assessment of nutrition requirement/status  ASSESSMENT:   Heather Floyd is a 84 y.o. female with medical history significant for severe dementia, hypertension, hyperlipidemia, restless leg syndrome, degenerative joint disease of the cervical spine and hypokalemia who presents this morning after a fall at home.   Pt admitted with lt displaced femoral neck fx. Pt is NPO for partial hip replacement.   Attempted to examine pt x 2 (pt was either agitated or down in OR at times of visits). Unable to obtain further nutrition hx or complete nutrition-focused physical exam at this time.   Case discussed with RN, who confirms NPO status for surgery this afternoon.   Meal completion has been poor; noted 0-10%. Reviewed wt hx; UBW ranges from 107-125#. Noted pt has experienced a 13.6% wt loss over the past 6 months, which is significant for time frame. Suspect poor oral intake PTA, however, unable to confirm at this time.   Pt is at high risk for malnutrition given advanced age, likely poor po intake, and dementia. However, unable to identify at this time. Will add oral nutrition supplements to optimize oral intake once diet is advanced.   Labs reviewed: K: 3.1  Diet Order:   Diet Order           Diet NPO time specified  Diet effective midnight          EDUCATION NEEDS:   Not appropriate for education at this time  Skin:  Skin Assessment: Reviewed RN Assessment  Last BM:   PTA  Height:   Ht Readings from Last 1 Encounters:  09/21/17  (1.575 m)    Weight:   Wt Readings from Last 1 Encounters:  10/18/17 108 lb (49 kg)    Ideal Body Weight:  50 kg  BMI:  Body mass index is 19.75 kg/m.  Estimated Nutritional Needs:   Kcal:  1200-1400  Protein:  55-70 grams  Fluid:  1.2-1.4 L    Javonn Gauger A. Mayford Knife, RD, LDN, CDE Pager: [phone] After hours Pager: [phone]

## 2017-10-19 NOTE — Progress Notes (Signed)
Patient had 3 bites of ice cream at 0900 per Heather Floyd, [place]. Notified Dr. Mal Amabile patient delayed until 1500.

## 2017-10-19 NOTE — Op Note (Signed)
ANTERIOR [facility] HIP  Procedure Note ADIBA FARGNOLI   [id number]  Pre-op Diagnosis: hip fracture     Post-op Diagnosis: same   Operative Procedures  1. Prosthetic replacement for femoral neck fracture. CPT [phone]  Personnel  Surgeon(s): Tarry Kos, MD  ASSIST: Oneal Grout, PA-C; necessary for the timely completion of procedure and due to complexity of procedure.   Anesthesia: general  Prosthesis: Depuy Femur: Corail KA 11 Head: 42 mm size: +1.5 Bearing Type: bipolar  Hip Hemiarthroplasty (Anterior Approach) Op Note:  After informed consent was obtained and the operative extremity marked in the holding area, the patient was brought back to the operating room and placed supine on the HANA table. Next, the operative extremity was prepped and draped in normal sterile fashion. Surgical timeout occurred verifying patient identification, surgical site, surgical procedure and administration of antibiotics.  A modified anterior Smith-Peterson approach to the hip was performed, using the interval between tensor fascia lata and sartorius.  Dissection was carried bluntly down onto the anterior hip capsule. The lateral femoral circumflex vessels were identified and coagulated. A capsulotomy was performed and the capsular flaps tagged for later repair.  Fluoroscopy was utilized to prepare for the femoral neck cut. The neck osteotomy was performed. The femoral head was removed and found a 42 mm head was the appropriate fit.    We then turned our attention to the femur.  After placing the femoral hook, the leg was taken to externally rotated, extended and adducted position taking care to perform soft tissue releases to allow for adequate mobilization of the femur. Soft tissue was cleared from the shoulder of the greater trochanter and the hook elevator used to improve exposure of the proximal femur. Sequential broaching performed up to a size 11. Trial neck and head were placed. The leg was  brought back up to neutral and the construct reduced. The position and sizing of components, offset and leg lengths were checked using fluoroscopy. Stability of the construct was checked in extension and external rotation without any subluxation or impingement of prosthesis. We dislocated the prosthesis, dropped the leg back into position, removed trial components, and irrigated copiously. The final stem and head was then placed, the leg brought back up, the system reduced and fluoroscopy used to verify positioning.  We irrigated, obtained hemostasis and closed the capsule using #2 ethibond suture.  The fascia was closed with #1 vicryl plus, the deep fat layer was closed with 0 vicryl, the subcutaneous layers closed with 2.0 Vicryl Plus and the skin closed with staples. A sterile dressing was applied. The patient was awakened in the operating room and taken to recovery in stable condition. All sponge, needle, and instrument counts were correct at the end of the case.   Position: supine  Complications: none.  Time Out: performed   Drains/Packing: none  Estimated blood loss: 100 cc  Returned to Recovery Room: in good condition.   Antibiotics: yes   Mechanical VTE (DVT) Prophylaxis: sequential compression devices, TED thigh-high  Chemical VTE (DVT) Prophylaxis: lovenox  Fluid Replacement: Crystalloid: see anesthesia record  Specimens Removed: 1 to pathology   Sponge and Instrument Count Correct? yes   PACU: portable radiograph - low AP   Admission: inpatient status, start PT & OT POD#1  Plan/RTC: Return in 2 weeks for staple removal. Return in 6 weeks to see MD.  Weight Bearing/Load Lower Extremity: full  Hip precautions: none Suture Removal: 10-14 days  Betadine to incision twice daily once  dressing is removed on POD#7  N. Glee Arvin, MD [facility] [phone] 4:20 PM    Implant Name Type Inv. Item Serial No. Manufacturer Lot No. LRB No. Used  BIPOLAR AML DEPUY  - [id number] Hips BIPOLAR AML DEPUY  DEPUY SYNTHES [id number] Left 1  STEM CORAIL KA11 - [id number] Stem STEM CORAIL KA11  DEPUY SYNTHES [id number] Left 1  HIP BALL ARTICU DEPUY - [id number] Hips HIP BALL ARTICU DEPUY  DEPUY SYNTHES [id number] Left 1

## 2017-10-19 NOTE — Discharge Instructions (Addendum)
° ° °  1. Change dressings as needed 2. May shower but keep incisions covered and dry 3. Take lovenox to prevent blood clots 4. Take stool softeners as needed 5. Take pain meds as needed   Nutrition Post Hospital Stay Proper nutrition can help your body recover from illness and injury.   Foods and beverages high in protein, vitamins, and minerals help rebuild muscle loss, promote healing, & reduce fall risk.   In addition to eating healthy foods, a nutrition shake is an easy, delicious way to get the nutrition you need during and after your hospital stay  It is recommended that you continue to drink 2 bottles per day of:       Ensure Enlive for at least 1 month (30 days) after your hospital stay   Tips for adding a nutrition shake into your routine: As allowed, drink one with vitamins or medications instead of water or juice Enjoy one as a tasty mid-morning or afternoon snack Drink cold or make a milkshake out of it Drink one instead of milk with cereal or snacks Use as a coffee creamer   Available at the following grocery stores and pharmacies:           * Karin Golden * [employer] * [employer]  * R[employer]          * Walmart * [employer]  * Wal[facility]reens      * Target  * BJ's   * [employer]S  * [employer]   * [facility] [phone]            For COUPONS visit: www.ensure.com/join or RoleLink.com.br   Suggested Substitutions Ensure Plus = Boost Plus = Carnation Breakfast Essentials = Boost Compact Ensure Active Clear = Boost Breeze Glucerna Shake = Boost Glucose Control = Carnation Breakfast Essentials SUGAR FREE

## 2017-10-19 NOTE — Progress Notes (Signed)
PT Cancellation Note  Patient Details Name: Heather Floyd MRN: [id number] DOB: 1933-01-23   Cancelled Treatment:    Reason Eval/Treat Not Completed: Active bedrest order;Other (comment)(awaiting surgery).  Pt is going down for surgery as we speak.  PT will await post op op orders.    Thanks,    Rollene Rotunda. Tayna Smethurst, PT, DPT [phone]   10/19/2017, 11:59 AM

## 2017-10-19 NOTE — Progress Notes (Signed)
PROGRESS NOTE  Heather Floyd ZOX:[id number] DOB: [birth year] DOA: 10/18/2017 PCP: Daisy Floro, MD  HPI/Recap of past 24 hours:  Patient is pleasantly demented, not able to provide reliable history She does not seem in distress Daughter at bedside  Assessment/Plan: Principal Problem:   Left displaced femoral neck fracture (HCC) Active Problems:   Accelerated hypertension   Subdural hematoma (HCC)   Hyperlipidemia   RLS (restless legs syndrome)   DJD (degenerative joint disease) of cervical spine   Hypokalemia   Abnormality of gait   Dementia   Palliative care encounter   Left femoral neck fracture -partial hip replacement planned today -will follow ortho recommendation, ortho Dr Roda Shutters input appreciated.  Hypokalemia Replace, check mag  HTN: continue home meds, adjust as needed  Dementia -at risk of delirium -Family at bedside assist care  Code Status: DNR  Family Communication: patient and daughter at bedside  Disposition Plan: snf vs home with home health   Consultants:  Orthopedics  Palliative care  Procedures:  partial hip replacement on 5/6  Antibiotics:  periopeative   Objective: BP (!) 147/82 (BP Location: Left Arm)   Pulse 100   Temp 98.4 F (36.9 C) (Oral)   Resp 18   Wt 49 kg (108 lb)   SpO2 90%   BMI 19.75 kg/m   Intake/Output Summary (Last 24 hours) at 10/19/2017 0735 Last data filed at 10/19/2017 0600 Gross per 24 hour  Intake 2249.75 ml  Output 400 ml  Net 1849.75 ml   Filed Weights   10/18/17 0018  Weight: 49 kg (108 lb)    Exam: Patient is examined daily including today on 10/19/2017, exams remain the same as of yesterday except that has changed    General:  NAD, frail, thin, demented  Cardiovascular: RRR  Respiratory: CTABL  Abdomen: Soft/ND/NT, positive BS  Musculoskeletal: left hip tender to palpation, No Edema  Neuro: alert, oriented to person  Data Reviewed: Basic Metabolic Panel: Recent Labs  Lab  10/18/17 0410 10/18/17 0927 10/18/17 1947 10/19/17 0415  NA 137  --   --  140  K 2.8* 2.3* 2.4* 3.1*  CL 92*  --   --  99*  CO2 29  --   --  32  GLUCOSE 179*  --   --  95  BUN 9  --   --  7  CREATININE 0.71 0.75  --  0.79  CALCIUM 9.2  --   --  8.0*   Liver Function Tests: No results for input(s): AST, ALT, ALKPHOS, BILITOT, PROT, ALBUMIN in the last 168 hours. No results for input(s): LIPASE, AMYLASE in the last 168 hours. No results for input(s): AMMONIA in the last 168 hours. CBC: Recent Labs  Lab 10/18/17 0410 10/18/17 1947 10/19/17 0415  WBC 11.2* 9.7 8.8  NEUTROABS 9.4*  --   --   HGB 15.7* 14.1 12.5  HCT 42.4 40.2 36.1  MCV 80.6 84.3 84.9  PLT 202 192 162   Cardiac Enzymes:   No results for input(s): CKTOTAL, CKMB, CKMBINDEX, TROPONINI in the last 168 hours. BNP (last 3 results) Recent Labs    03/16/17 0919  BNP 53.5    ProBNP (last 3 results) No results for input(s): PROBNP in the last 8760 hours.  CBG: No results for input(s): GLUCAP in the last 168 hours.  Recent Results (from the past 240 hour(s))  MRSA PCR Screening     Status: None   Collection Time: 10/18/17 10:05 AM  Result Value  Ref Range Status   MRSA by PCR NEGATIVE NEGATIVE Final    Comment:        The GeneXpert MRSA Assay (FDA approved for NASAL specimens only), is one component of a comprehensive MRSA colonization surveillance program. It is not intended to diagnose MRSA infection nor to guide or monitor treatment for MRSA infections. Performed at [facility], [address], [place], [place] [address]      Studies: No results found.  Scheduled Meds: . acidophilus  1 capsule Oral Daily  . amLODipine  10 mg Oral Daily  . docusate sodium  100 mg Oral BID  . lisinopril  40 mg Oral Daily  . memantine  28 mg Oral Daily  . potassium chloride  40 mEq Oral TID  . potassium chloride  40 mEq Oral Once  . QUEtiapine  12.5 mg Oral BID  . sertraline  100 mg Oral Daily  .  tranexamic acid (CYKLOKAPRON) topical -INTRAOP  2,000 mg Topical Once  . traZODone  50 mg Oral QHS    Continuous Infusions: . 0.9 % NaCl with KCl 40 mEq / L 75 mL/hr (10/18/17 1653)  .  ceFAZolin (ANCEF) IV    . magnesium sulfate 1 - 4 g bolus IVPB       Time spent: 25 mins I have personally reviewed and interpreted on  10/19/2017 daily labs, tele strips, imagings as discussed above under date review session and assessment and plans.  I reviewed all nursing notes, pharmacy notes, consultant notes,  vitals, pertinent old records  I have discussed plan of care as described above with RN , patient and family on 10/19/2017   Albertine Grates MD, PhD  [facility] Pager [phone]. If 7PM-7AM, please contact night-coverage at [email or web address], password [facility] 10/19/2017, 7:35 AM  LOS: 1 day

## 2017-10-19 NOTE — Progress Notes (Signed)
OT Cancellation Note  Patient Details Name: ALYSSAMAE KLINCK MRN: [id number] DOB: 05-23-1933   Cancelled Treatment:    Reason Eval/Treat Not Completed: Active bedrest order(Pt at surgery).  Pt is off the unit at surgery.  OT will await post op orders.  Rosalio Loud 10/19/2017, 2:42 PM

## 2017-10-19 NOTE — Anesthesia Procedure Notes (Signed)
Procedure Name: Intubation Date/Time: 10/19/2017 3:15 PM Performed by: Wilder Glade, CRNA Pre-anesthesia Checklist: Patient identified, Emergency Drugs available, Suction available, Timeout performed and Patient being monitored Patient Re-evaluated:Patient Re-evaluated prior to induction Oxygen Delivery Method: Circle system utilized Preoxygenation: Pre-oxygenation with 100% oxygen Induction Type: IV induction Ventilation: Mask ventilation without difficulty Laryngoscope Size: Miller and 2 Grade View: Grade I Tube type: Oral Tube size: 6.5 mm Number of attempts: 1 Airway Equipment and Method: Stylet Placement Confirmation: ETT inserted through vocal cords under direct vision,  positive ETCO2 and breath sounds checked- equal and bilateral Secured at: 21 cm Tube secured with: Tape

## 2017-10-19 NOTE — Transfer of Care (Signed)
Immediate Anesthesia Transfer of Care Note  Patient: Heather Floyd  Procedure(s) Performed: ANTERIOR HEMI HIP (Left Hip)  Patient Location: PACU  Anesthesia Type:General  Level of Consciousness: awake, alert  and patient cooperative  Airway & Oxygen Therapy: Patient Spontanous Breathing and Patient connected to face mask oxygen  Post-op Assessment: Report given to RN and Post -op Vital signs reviewed and stable  Post vital signs: Reviewed and stable  Last Vitals:  Vitals Value Taken Time  BP 154/77 10/19/2017  4:57 PM  Temp    Pulse 94 10/19/2017  5:01 PM  Resp 22 10/19/2017  5:01 PM  SpO2 95 % 10/19/2017  5:01 PM  Vitals shown include unvalidated device data.  Last Pain:  Vitals:   10/19/17 1008  TempSrc:   PainSc: 4          Complications: No apparent anesthesia complications

## 2017-10-19 NOTE — H&P (Signed)
H&P update  The surgical history has been reviewed and remains accurate without interval change.  The patient was re-examined and patient's physiologic condition has not changed significantly in the last 30 days. The condition still exists that makes this procedure necessary. The treatment plan remains the same, without new options for care.  No new pharmacological allergies or types of therapy has been initiated that would change the plan or the appropriateness of the plan.  The patient and/or family understand the potential benefits and risks.  [name], MD 10/19/2017 10:12 AM

## 2017-10-19 NOTE — Anesthesia Postprocedure Evaluation (Signed)
Anesthesia Post Note  Patient: Heather Floyd  Procedure(s) Performed: ANTERIOR HEMI HIP (Left Hip)     Patient location during evaluation: PACU Anesthesia Type: General Level of consciousness: awake and alert and patient cooperative Pain management: pain level controlled Vital Signs Assessment: post-procedure vital signs reviewed and stable Respiratory status: spontaneous breathing, nonlabored ventilation, respiratory function stable and patient connected to nasal cannula oxygen Cardiovascular status: blood pressure returned to baseline and stable Postop Assessment: no apparent nausea or vomiting Anesthetic complications: no    Last Vitals:  Vitals:   10/19/17 1728 10/19/17 1755  BP:  (!) 154/88  Pulse:  92  Resp:    Temp: 36.5 C (!) 36.3 C  SpO2:  95%    Last Pain:  Vitals:   10/19/17 1755  TempSrc: Oral  PainSc:                  Ellawyn Wogan,E. Liberty Stead

## 2017-10-20 ENCOUNTER — Encounter (HOSPITAL_COMMUNITY): Payer: Self-pay | Admitting: Orthopaedic Surgery

## 2017-10-20 LAB — BASIC METABOLIC PANEL
ANION GAP: 8 (ref 5–15)
BUN: 7 mg/dL (ref 6–20)
CHLORIDE: 102 mmol/L (ref 101–111)
CO2: 31 mmol/L (ref 22–32)
Calcium: 8.2 mg/dL — ABNORMAL LOW (ref 8.9–10.3)
Creatinine, Ser: 0.71 mg/dL (ref 0.44–1.00)
GFR calc Af Amer: >60 mL/min (ref >60)
GLUCOSE: 120 mg/dL — AB (ref 65–99)
Potassium: 3.7 mmol/L (ref 3.5–5.1)
Sodium: 141 mmol/L (ref 135–145)

## 2017-10-20 LAB — CBC
HEMATOCRIT: 31.9 % — AB (ref 36.0–46.0)
Hemoglobin: 10.8 g/dL — ABNORMAL LOW (ref 12.0–15.0)
MCH: 28.7 pg (ref 26.0–34.0)
MCHC: 33.9 g/dL (ref 30.0–36.0)
MCV: 84.8 fL (ref 78.0–100.0)
Platelets: 160 10*3/uL (ref 150–400)
RBC: 3.76 MIL/uL — AB (ref 3.87–5.11)
RDW: 13.5 % (ref 11.5–15.5)
WBC: 10.3 10*3/uL (ref 4.0–10.5)

## 2017-10-20 LAB — MAGNESIUM: Magnesium: 1.9 mg/dL (ref 1.7–2.4)

## 2017-10-20 MED ORDER — POTASSIUM CHLORIDE CRYS ER 20 MEQ PO TBCR
20.0000 meq | EXTENDED_RELEASE_TABLET | Freq: Once | ORAL | Status: AC
  Administered 2017-10-20: 20 meq via ORAL
  Filled 2017-10-20: qty 1

## 2017-10-20 MED ORDER — ADULT MULTIVITAMIN W/MINERALS CH
1.0000 | ORAL_TABLET | Freq: Every day | ORAL | Status: DC
  Administered 2017-10-20 – 2017-10-21 (×2): 1 via ORAL
  Filled 2017-10-20 (×2): qty 1

## 2017-10-20 MED ORDER — ENSURE ENLIVE PO LIQD
237.0000 mL | Freq: Three times a day (TID) | ORAL | Status: DC
  Administered 2017-10-20 – 2017-10-23 (×6): 237 mL via ORAL

## 2017-10-20 NOTE — Evaluation (Signed)
Occupational Therapy Evaluation Patient Details Name: Heather Floyd MRN: [id number] DOB: 03/16/1933 Today's Date: 10/20/2017    History of Present Illness Pt is an 84 y.o. female who sustained L hip fracture after a mechanical fall at home. Pt now s/p anterior approach prosthetic replacement for femoral neck fracture. PMH significant for carotid artery occlusion, concussion with loss of consciousness, degenerative joint disease of cervical spine, hyperlipidemia, hypertension, severe dementia, restless legs syndrome, sleep disorder, TIA, and varicose veins.    Clinical Impression   PTA, pt was living with her daughter and had assistance/supervision for ADL participation from personal care attendant during the day and daughter at night. Pt was able to complete basic ADL with supervision and required assistance for IADL tasks. Pt currently requiring max assist for LB ADL, mod assist +2 for safety with toilet transfers, and total assistance for toileting hygiene. Pt is eager to get moving but is limited in her understanding of new concepts or situation due to dementia. Her daughters were in the room throughout session. Feel pt would benefit from intensive rehabilitation to maximize return to independence and family will need maximum education concerning caring for pt once discharged home with current limitations. Will recommend CIR at this time in order to maximize recovery and pt's ability to return home as soon as possible with assistance from family. Will continue to follow while admitted.     Follow Up Recommendations  CIR;Supervision/Assistance - 24 hour    Equipment Recommendations  Other (comment)(defer to next venue of care)    Recommendations for Other Services       Precautions / Restrictions Precautions Precautions: Fall Restrictions Weight Bearing Restrictions: Yes LLE Weight Bearing: Weight bearing as tolerated      Mobility Bed Mobility Overal bed mobility: Needs  Assistance Bed Mobility: Supine to Sit     Supine to sit: Min assist;HOB elevated     General bed mobility comments: Min assist to help progress left leg to EOB and support trunk during transition to sit.  HOB elevated  Transfers Overall transfer level: Needs assistance Equipment used: Rolling walker (2 wheeled) Transfers: Sit to/from Stand Sit to Stand: Mod assist         General transfer comment: Min to mod assist to support trunk during transitions, manual assist for safe hand placement and especially control to go to sitting.     Balance Overall balance assessment: Needs assistance Sitting-balance support: Feet supported;Bilateral upper extremity supported Sitting balance-Leahy Scale: Fair     Standing balance support: Bilateral upper extremity supported Standing balance-Leahy Scale: Poor                             ADL either performed or assessed with clinical judgement   ADL Overall ADL's : Needs assistance/impaired Eating/Feeding: Set up;Sitting   Grooming: Moderate assistance;Standing Grooming Details (indicate cue type and reason): assist for balance Upper Body Bathing: Minimal assistance;Sitting   Lower Body Bathing: Sit to/from stand;Maximal assistance   Upper Body Dressing : Minimal assistance;Sitting   Lower Body Dressing: Sit to/from stand;Maximal assistance   Toilet Transfer: Moderate assistance;+2 for safety/equipment;Ambulation;BSC;RW Toilet Transfer Details (indicate cue type and reason): BSC over toilet Toileting- Clothing Manipulation and Hygiene: Sit to/from stand;Total assistance       Functional mobility during ADLs: Moderate assistance;Rolling walker;+2 for safety/equipment       Vision Patient Visual Report: No change from baseline Vision Assessment?: No apparent visual deficits     Perception  Praxis      Pertinent Vitals/Pain Pain Assessment: Faces Faces Pain Scale: Hurts little more Pain Location: L hip  "sore" Pain Descriptors / Indicators: Aching;Discomfort;Grimacing;Sore Pain Intervention(s): Limited activity within patient's tolerance;Monitored during session;Repositioned     Hand Dominance Right   Extremity/Trunk Assessment Upper Extremity Assessment Upper Extremity Assessment: Defer to OT evaluation   Lower Extremity Assessment Lower Extremity Assessment: LLE deficits/detail LLE Deficits / Details: normal post op pain and weakness, ankle at least 3/5, knee 3-/5, hiop 2/5 per gross functional assessment.    Cervical / Trunk Assessment Cervical / Trunk Assessment: Normal   Communication Communication Communication: No difficulties   Cognition Arousal/Alertness: Awake/alert Behavior During Therapy: Restless;Impulsive Overall Cognitive Status: History of cognitive impairments - at baseline                                 General Comments: History of dementia. Able to converse but highly tangential. Unable to maintain on topic conversation.    General Comments       Exercises Exercises: Total Joint Total Joint Exercises Heel Slides: AAROM;Left;10 reps Hip ABduction/ADduction: AAROM;Left;10 reps   Shoulder Instructions      Home Living Family/patient expects to be discharged to:: Private residence Living Arrangements: Children Available Help at Discharge: Family;Personal care attendant;Other (Comment)(nearly 24 hours/day; daughter at night, PCA during day) Type of Home: House Home Access: Stairs to enter [employer] of Steps: 2 Entrance Stairs-Rails: None Home Layout: One level     Bathroom Shower/Tub: Producer, television/film/video: Standard     Home Equipment: Environmental consultant - 2 wheels;Shower seat   Additional Comments: information from daughter and previous admission      Prior Functioning/Environment Level of Independence: Needs assistance  Gait / Transfers Assistance Needed: Ambulates without assistive devices. ADL's / Homemaking  Assistance Needed: Supervision for safety wtih basic ADL. Assist for IADL.    Comments: Daughter home with pt at night. Private personal care attendant 8 hours during the day.         OT Problem List: Decreased strength;Decreased range of motion;Decreased activity tolerance;Impaired balance (sitting and/or standing);Decreased safety awareness;Decreased knowledge of use of DME or AE;Decreased knowledge of precautions;Pain;Decreased cognition      OT Treatment/Interventions: Self-care/ADL training;Therapeutic exercise;Energy conservation;DME and/or AE instruction;Therapeutic activities;Patient/family education;Balance training    OT Goals(Current goals can be found in the care plan section) Acute Rehab OT Goals Patient Stated Goal: to go home OT Goal Formulation: With family Time For Goal Achievement: 11/03/17 Potential to Achieve Goals: Good ADL Goals Pt Will Perform Grooming: standing;with supervision Pt Will Perform Upper Body Bathing: with supervision;sitting Pt Will Perform Lower Body Bathing: with supervision;sit to/from stand Pt Will Transfer to Toilet: with supervision;ambulating;regular height toilet Pt Will Perform Toileting - Clothing Manipulation and hygiene: with supervision;sit to/from stand  OT Frequency: Min 2X/week   Barriers to D/C:            Co-evaluation              AM-PAC PT "6 Clicks" Daily Activity     Outcome Measure Help from another person eating meals?: A Little Help from another person taking care of personal grooming?: A Lot Help from another person toileting, which includes using toliet, bedpan, or urinal?: A Lot Help from another person bathing (including washing, rinsing, drying)?: A Lot Help from another person to put on and taking off regular upper body clothing?: A Little Help from  another person to put on and taking off regular lower body clothing?: A Lot 6 Click Score: 14   End of Session Equipment Utilized During Treatment: Gait  belt;Rolling walker Nurse Communication: Mobility status  Activity Tolerance: Patient tolerated treatment well Patient left: with call bell/phone within reach;with family/visitor present;in chair;with chair alarm set  OT Visit Diagnosis: Other abnormalities of gait and mobility (R26.89);Pain;Other symptoms and signs involving cognitive function Pain - Right/Left: Left Pain - part of body: Hip                Time: 4401-0272 OT Time Calculation (min): 40 min Charges:  OT General Charges $OT Visit: 1 Visit OT Evaluation $OT Eval Moderate Complexity: 1 Mod G-Codes:     Doristine Section, MS OTR/L  Pager: [phone]   Mabrey Howland A Adamari Frede 10/20/2017, 5:08 PM

## 2017-10-20 NOTE — Care Management Note (Signed)
Case Management Note  Patient Details  Name: TRENACE COUGHLIN MRN: [id number] Date of Birth: 11-May-1933  Subjective/Objective:                    Action/Plan:  5-6 Prosthetic replacement for femoral neck fracture  Await post op PT eval Expected Discharge Date:                  Expected Discharge Plan:     In-House Referral:     Discharge planning Services  CM Consult  Post Acute Care Choice:    Choice offered to:     DME Arranged:    DME Agency:     HH Arranged:    HH Agency:     Status of Service:  In process, will continue to follow  If discussed at Long Length of Stay Meetings, dates discussed:    Additional Comments:  Kingsley Plan, RN 10/20/2017, 11:43 AM

## 2017-10-20 NOTE — Clinical Social Work Note (Signed)
Clinical Social Work Assessment  Patient Details  Name: Heather Floyd MRN: [id number] Date of Birth: [birth year]  Date of referral:  10/20/17               Reason for consult:  Facility Placement, Discharge Planning                Permission sought to share information with:  Facility Sport and exercise psychologist, Family Supports Permission granted to share information::  Yes, Verbal Permission Granted  Name::     Augustin Coupe and Pensions consultant::  SNFs  Relationship::  daughters  Contact Information:  [phone]  Housing/Transportation Living arrangements for the past 2 months:  Single Family Home Source of Information:  Adult Children Patient Interpreter Needed:  None Criminal Activity/Legal Involvement Pertinent to Current Situation/Hospitalization:  No - Comment as needed Significant Relationships:  Adult Children, Other Family Members Lives with:  Adult Children Do you feel safe going back to the place where you live?  Yes Need for family participation in patient care:  Yes (Comment)  Care giving concerns:  Pt has dementia, pt family worried that pt will forget that she has had surgery and attempt mobility.   Social Worker assessment / plan:  CSW met with pt and pt daughter Randall Hiss whom she lives with. Pt daughter states that pt has dementia and she and her sister are interested in inpatient rehab. CSW explained CIR vs SNF process and that pt may need to be able to do intensive therapies and be approved by insurance. CSW explained SNF back up options and that CSW would follow along with CIR recommendations. Pt daughter states that she lives with pt and is able to help her out but she works and is unable to be at home with pt throughout the day every day.   CSW will continue to follow.   Employment status:  Retired Nurse, adult PT Recommendations:  [place], [place] / Referral to community resources:  [place]  Patient/Family's Response to care:  Pt family understand CSW role and that there may be a choice between CIR and SNF. CSW to follow.  Patient/Family's Understanding of and Emotional Response to Diagnosis, Current Treatment, and Prognosis:  Pt family state understanding of diagnosis, current treatment and prognosis. Pt daughter states that she has a good grasp on her mothers dementia and would like support with placement options.   Emotional Assessment Appearance:  Appears stated age Attitude/Demeanor/Rapport:  Self-Absorbed, Charismatic(Confused) Affect (typically observed):  Pleasant(Confused) Orientation:  Oriented to Self, Fluctuating Orientation (Suspected and/or reported Sundowners) Alcohol / Substance use:  Not Applicable Psych involvement (Current and /or in the community):  No (Comment)  Discharge Needs  Concerns to be addressed:  Care Coordination, Cognitive Concerns Readmission within the last 30 days:    Current discharge risk:  Physical Impairment, Cognitively Impaired, Dependent with Mobility Barriers to Discharge:  Ship broker, Continued Medical Work up   [employer], [place] 10/20/2017, 5:07 PM

## 2017-10-20 NOTE — Progress Notes (Addendum)
Nutrition Follow-up  DOCUMENTATION CODES:   Not applicable  INTERVENTION:   -Continue Ensure Enlive po TID, each supplement provides 350 kcal and 20 grams of protein -MVI daily -Downgrade diet to dysphagia 2 (mechanical soft) consistency for ease of intake  NUTRITION DIAGNOSIS:   Increased nutrient needs related to post-op healing as evidenced by estimated needs.  Ongoing  GOAL:   Patient will meet greater than or equal to 90% of their needs  Progressing  MONITOR:   PO intake, Supplement acceptance, Diet advancement, Labs, Weight trends, Skin, I & O's  REASON FOR ASSESSMENT:   Consult Assessment of nutrition requirement/status  ASSESSMENT:   Heather Floyd is a 84 y.o. female with medical history significant for severe dementia, hypertension, hyperlipidemia, restless leg syndrome, degenerative joint disease of the cervical spine and hypokalemia who presents this morning after a fall at home.   5/6- s/p Prosthetic replacement for femoral neck fracture  Case discussed with RN, who reports minimal intake, however, takes pills without difficulty with aplesauce or chocolate ice cream.  Pt unable to provide history due to cognitive deficit. Hx obtained from pt daughter at bedside, who describes a general decline in health over the past 1-2 years related to dementia. She reports that pt has always been a small, selective eater, but always complains of early satiety. Pt reports she will typically complain that she is full after 2-3 bites of food, but will often eat more with family encouragement. Pt is offered 3 meals per day and family also has snacks readily available, such as cookies, cheetos, and coke. Pt will occasionally drink Ensure mixed with milkshake. She also prefers chocolate pudding and ice cream.   Per pt daughter, pt expends a lot of energy chewing food and does not eat a lot of meat due to tough texture. Pt follows a mechanical soft diet at home for ease of intake.  Pt 's current diet order (soft), is a surgical soft diet (low fat, low fiber, low residue), which is designed for pt's experiencing GI illness or surgeries. Will downgrade diet to mechanical soft (dysphagia 2) for ease of intake.   Pt daughter confirms significant wt loss over the past 6 months. Reviewed wt hx; UBW ranges from 107-125#. Noted pt has experienced a 13.6% wt loss over the past 6 months, which is significant for time frame.  Pt with milk muscle depletion in upper and lower extremities, which is common in the elderly population. Pt daughter shares that pt has always been small framed, but strong. Pt remains ambulatory PTA, however, experienced falls at home.   Discussed importance of good meal and supplement intake to promote healing. Pt family is very supportive and assists with feeding pt. Encouraged pt daughter to continue supplements as well as provide feeding schedule with snacks in between meals to promote increased intake. Also discussed liberalized diet at home to increase intake.   Pt remains at high risk for malnutrition, however, unable to identify at this time.   Labs reviewed.   NUTRITION - FOCUSED PHYSICAL EXAM:    Most Recent Value  Orbital Region  No depletion  Upper Arm Region  Mild depletion  Thoracic and Lumbar Region  No depletion  Buccal Region  No depletion  Temple Region  No depletion  Clavicle Bone Region  No depletion  Clavicle and Acromion Bone Region  No depletion  Scapular Bone Region  Unable to assess  Dorsal Hand  Mild depletion  Patellar Region  Mild depletion  Anterior Thigh  Region  Mild depletion  Posterior Calf Region  Mild depletion  Edema (RD Assessment)  None  Hair  Reviewed  Eyes  Reviewed  Mouth  Reviewed  Skin  Reviewed  Nails  Reviewed       Diet Order:   Diet Order           DIET SOFT Room service appropriate? Yes; Fluid consistency: Thin  Diet effective now          EDUCATION NEEDS:   Not appropriate for education  at this time  Skin:  Skin Assessment: Reviewed RN Assessment  Last BM:  PTA  Height:   Ht Readings from Last 1 Encounters:  09/21/17  (1.575 m)    Weight:   Wt Readings from Last 1 Encounters:  10/18/17 108 lb (49 kg)    Ideal Body Weight:  50 kg  BMI:  Body mass index is 19.75 kg/m.  Estimated Nutritional Needs:   Kcal:  1200-1400  Protein:  55-70 grams  Fluid:  1.2-1.4 L    Kyleigh Nannini A. Mayford Knife, RD, LDN, CDE Pager: [phone] After hours Pager: [phone]

## 2017-10-20 NOTE — Social Work (Signed)
CSW following in case CIR screen recommends SNF instead. Have discussed this possibility with pt family.  Doy Hutching, LCSWA [facility] Clinical Social Work [phone]

## 2017-10-20 NOTE — Evaluation (Signed)
Physical Therapy Evaluation Patient Details Name: Heather Floyd MRN: [id number] DOB: 30-Oct-1932 Today's Date: 10/20/2017   History of Present Illness  Pt is an 84 y.o. female who sustained L hip fracture after a mechanical fall at home. Pt now s/p anterior approach prosthetic replacement for femoral neck fracture. PMH significant for carotid artery occlusion, concussion with loss of consciousness, degenerative joint disease of cervical spine, hyperlipidemia, hypertension, severe dementia, restless legs syndrome, sleep disorder, TIA, and varicose veins.   Clinical Impression  Pt was able to get up and walk around the room with two person (for safety) mod assist and RW.  Daughters are concerned given her dementia in both taking her home or to post acute rehab as she will try to get up as she doesn't realize her hip has been operated on.  I anticipate she will progress quickly based on her session today, and she has daily caregivers at home and would be most ideal to be off of the RW before d/c home as she will not use it once there.  Daughters have inquired about CIR level therapies and I said that I could ask for a screen to see if she would be a candidate.   PT to follow acutely for deficits listed below.       Follow Up Recommendations CIR    Equipment Recommendations  None recommended by PT    Recommendations for Other Services Rehab consult     Precautions / Restrictions Precautions Precautions: Fall Restrictions Weight Bearing Restrictions: Yes LLE Weight Bearing: Weight bearing as tolerated      Mobility  Bed Mobility Overal bed mobility: Needs Assistance Bed Mobility: Supine to Sit     Supine to sit: Min assist;HOB elevated     General bed mobility comments: Min assist to help progress left leg to EOB and support trunk during transition to sit.  HOB elevated  Transfers Overall transfer level: Needs assistance Equipment used: Rolling walker (2 wheeled) Transfers: Sit  to/from Stand Sit to Stand: Mod assist         General transfer comment: Min to mod assist to support trunk during transitions, manual assist for safe hand placement and especially control to go to sitting.   Ambulation/Gait Ambulation/Gait assistance: Mod assist;+2 safety/equipment Ambulation Distance (Feet): 25 Feet Assistive device: Rolling walker (2 wheeled) Gait Pattern/deviations: Step-to pattern;Antalgic;Leaning posteriorly     General Gait Details: Pt with up to mod assist to support trunk for balance and during WB on her left leg during gait.  Verbal cues for safety, manual assist to steer RW, second person helpful for lines.         Balance Overall balance assessment: Needs assistance Sitting-balance support: Feet supported;Bilateral upper extremity supported Sitting balance-Leahy Scale: Fair     Standing balance support: Bilateral upper extremity supported Standing balance-Leahy Scale: Poor                               Pertinent Vitals/Pain Pain Assessment: Faces Faces Pain Scale: Hurts little more Pain Location: L hip "sore" Pain Descriptors / Indicators: Aching;Discomfort;Grimacing;Sore Pain Intervention(s): Limited activity within patient's tolerance;Monitored during session;Repositioned    Home Living Family/patient expects to be discharged to:: Private residence Living Arrangements: Children Available Help at Discharge: Family;Personal care attendant;Other (Comment)(nearly 24 hours/day; daughter at night, PCA during day) Type of Home: House Home Access: Stairs to enter Entrance Stairs-Rails: None Entrance Stairs-Number of Steps: 2 Home Layout: One level Home  Equipment: Dan Humphreys - 2 wheels;Shower seat Additional Comments: information from daughter and previous admission    Prior Function Level of Independence: Needs assistance   Gait / Transfers Assistance Needed: Ambulates without assistive devices.  ADL's / Homemaking Assistance  Needed: Supervision for safety wtih basic ADL. Assist for IADL.   Comments: Daughter home with pt at night. Private personal care attendant 8 hours during the day.      Hand Dominance   Dominant Hand: Right    Extremity/Trunk Assessment   Upper Extremity Assessment Upper Extremity Assessment: Defer to OT evaluation    Lower Extremity Assessment Lower Extremity Assessment: LLE deficits/detail LLE Deficits / Details: normal post op pain and weakness, ankle at least 3/5, knee 3-/5, hiop 2/5 per gross functional assessment.     Cervical / Trunk Assessment Cervical / Trunk Assessment: Normal  Communication   Communication: No difficulties  Cognition Arousal/Alertness: Awake/alert Behavior During Therapy: Restless;Impulsive Overall Cognitive Status: History of cognitive impairments - at baseline                                 General Comments: History of dementia. Able to converse but highly tangential. Unable to maintain on topic conversation.       General Comments      Exercises Total Joint Exercises Heel Slides: AAROM;Left;10 reps Hip ABduction/ADduction: AAROM;Left;10 reps   Assessment/Plan    PT Assessment Patient needs continued PT services  PT Problem List Decreased strength;Decreased range of motion;Decreased activity tolerance;Decreased balance;Decreased mobility;Decreased knowledge of use of DME;Decreased knowledge of precautions;Pain       PT Treatment Interventions DME instruction;Gait training;Stair training;Functional mobility training;Therapeutic activities;Therapeutic exercise;Balance training;Patient/family education;Manual techniques;Modalities    PT Goals (Current goals can be found in the Care Plan section)  Acute Rehab PT Goals Patient Stated Goal: to go home PT Goal Formulation: With patient/family Time For Goal Achievement: 11/03/17 Potential to Achieve Goals: Good    Frequency Min 3X/week    AM-PAC PT "6 Clicks" Daily  Activity  Outcome Measure Difficulty turning over in bed (including adjusting bedclothes, sheets and blankets)?: Unable Difficulty moving from lying on back to sitting on the side of the bed? : Unable Difficulty sitting down on and standing up from a chair with arms (e.g., wheelchair, bedside commode, etc,.)?: Unable Help needed moving to and from a bed to chair (including a wheelchair)?: A Lot Help needed walking in hospital room?: A Lot Help needed climbing 3-5 steps with a railing? : Total 6 Click Score: 8    End of Session   Activity Tolerance: Patient limited by pain Patient left: in chair;with call bell/phone within reach;with chair alarm set;with family/visitor present Nurse Communication: Mobility status PT Visit Diagnosis: Muscle weakness (generalized) (M62.81);Difficulty in walking, not elsewhere classified (R26.2);Pain Pain - Right/Left: Left Pain - part of body: Leg    Time: 1610-9604 PT Time Calculation (min) (ACUTE ONLY): 42 min   Charges:        Lurena Joiner B. Earmon Sherrow, PT, DPT [phone]   PT Evaluation $PT Eval Moderate Complexity: 1 Mod PT Treatments $Therapeutic Activity: 8-22 mins   10/20/2017, 5:09 PM

## 2017-10-20 NOTE — NC FL2 (Signed)
[place] MEDICAID FL2 LEVEL OF CARE SCREENING TOOL     IDENTIFICATION  Patient Name: Heather Floyd Birthdate: Dec 19, 1932 Sex: female Admission Date (Current Location): 10/18/2017  [facility] and [place] Number:  Producer, television/film/video and Address:  [facility], [address], [place], [place] [address]      Provider Number: [id number]  Attending Physician Name and Address:  Albertine Grates, MD  Relative Name and Phone Number:  Juel Burrow; daughter; [phone]    Current Level of Care: Hospital Recommended Level of Care: Skilled Nursing Facility Prior Approval Number:    Date Approved/Denied:   PASRR Number: [id number]  Discharge Plan: SNF    Current Diagnoses: Patient Active Problem List   Diagnosis Date Noted  . Left displaced femoral neck fracture (HCC) 10/18/2017  . Palliative care encounter   . Colitis 08/25/2017  . Abdominal pain 04/08/2017  . Leukocytosis 04/08/2017  . Dementia 04/08/2017  . Dehydration 04/08/2017  . Abnormal urinalysis 04/08/2017  . Enteritis 04/07/2017  . Abnormality of gait 09/08/2014  . Concussion with loss of consciousness 09/08/2014  . Subdural hematoma (HCC) 08/11/2014  . Hypokalemia 08/11/2014  . Patellar fracture 08/11/2014  . SDH (subdural hematoma) (HCC) 08/11/2014  . Hyperlipidemia   . Hypertension   . RLS (restless legs syndrome)   . DJD (degenerative joint disease) of cervical spine   . TIA (transient ischemic attack)   . Carotid artery occlusion   . Essential hypertension   . Memory disturbance 03/29/2013  . Near syncope 01/13/2013  . Accelerated hypertension 01/13/2013  . Hyperkalemia 01/13/2013    Orientation RESPIRATION BLADDER Height & Weight     Self  Normal, O2(intermittant nasal canula 2 L) Incontinent, Indwelling catheter Weight: 108 lb (49 kg) Height:     BEHAVIORAL SYMPTOMS/MOOD NEUROLOGICAL BOWEL NUTRITION STATUS      Continent Diet(see discharge summary)  AMBULATORY STATUS COMMUNICATION OF  NEEDS Skin   Extensive Assist Verbally Surgical wounds(incision on left hip with hydrocolloid dressing)                       Personal Care Assistance Level of Assistance  Feeding, Dressing, Bathing Bathing Assistance: Maximum assistance Feeding assistance: Independent Dressing Assistance: Maximum assistance     Functional Limitations Info  Sight, Speech, Hearing Sight Info: Adequate Hearing Info: Adequate Speech Info: Adequate    SPECIAL CARE FACTORS FREQUENCY  PT (By licensed PT), OT (By licensed OT)     PT Frequency: 5x week OT Frequency: 5x week            Contractures Contractures Info: Not present    Additional Factors Info  Code Status, Allergies, Psychotropic Code Status Info: DNR Allergies Info: Aspirin, Codeine Psychotropic Info: memantine (NAMENDA XR) 24 hr capsule 28 mg PO daily; QUEtiapine (SEROQUEL) tablet 12.5 mg 2x daily if agitated; sertraline (ZOLOFT) tablet 100 mg PO daily; traZODone (DESYREL) tablet 50 mg daily at bedtime         Current Medications (10/20/2017):  This is the current hospital active medication list Current Facility-Administered Medications  Medication Dose Route Frequency Provider Last Rate Last Dose  . 0.9 %  sodium chloride infusion   Intravenous Continuous Tarry Kos, MD      . acetaminophen (TYLENOL) tablet 325-650 mg  325-650 mg Oral Q6H PRN Tarry Kos, MD      . acidophilus (RISAQUAD) capsule 1 capsule  1 capsule Oral Daily Tarry Kos, MD   1 capsule  at 10/20/17 1051  . alum & mag hydroxide-simeth (MAALOX/MYLANTA) 200-200-20 MG/5ML suspension 30 mL  30 mL Oral Q4H PRN Tarry Kos, MD      . amLODipine (NORVASC) tablet 10 mg  10 mg Oral Daily Tarry Kos, MD   10 mg at 10/20/17 1051  . bisacodyl (DULCOLAX) EC tablet 5 mg  5 mg Oral Daily PRN Tarry Kos, MD      . docusate sodium (COLACE) capsule 100 mg  100 mg Oral BID Tarry Kos, MD   100 mg at 10/19/17 2112  . enoxaparin (LOVENOX) injection 40 mg  40 mg  Subcutaneous Q24H Tarry Kos, MD      . feeding supplement (ENSURE ENLIVE) (ENSURE ENLIVE) liquid 237 mL  237 mL Oral TID BM Albertine Grates, MD      . HYDROcodone-acetaminophen (NORCO) 7.5-325 MG per tablet 1-2 tablet  1-2 tablet Oral Q4H PRN Tarry Kos, MD      . HYDROcodone-acetaminophen (NORCO/VICODIN) 5-325 MG per tablet 1-2 tablet  1-2 tablet Oral Q4H PRN Tarry Kos, MD      . HYDROmorphone (DILAUDID) injection 0.5 mg  0.5 mg Intravenous Q2H PRN Tarry Kos, MD   0.5 mg at 10/20/17 0135  . hydrOXYzine (ATARAX/VISTARIL) tablet 25 mg  25 mg Oral TID PRN Tarry Kos, MD      . lactated ringers infusion   Intravenous Continuous Tarry Kos, MD 10 mL/hr at 10/19/17 1417    . lisinopril (PRINIVIL,ZESTRIL) tablet 40 mg  40 mg Oral Daily Tarry Kos, MD   40 mg at 10/20/17 1051  . memantine (NAMENDA XR) 24 hr capsule 28 mg  28 mg Oral Daily Tarry Kos, MD   28 mg at 10/20/17 1051  . menthol-cetylpyridinium (CEPACOL) lozenge 3 mg  1 lozenge Oral PRN Tarry Kos, MD       Or  . phenol (CHLORASEPTIC) mouth spray 1 spray  1 spray Mouth/Throat PRN Tarry Kos, MD      . metoCLOPramide (REGLAN) tablet 5-10 mg  5-10 mg Oral Q8H PRN Tarry Kos, MD       Or  . metoCLOPramide (REGLAN) injection 5-10 mg  5-10 mg Intravenous Q8H PRN Tarry Kos, MD      . morphine 4 MG/ML injection 0.52-1 mg  0.52-1 mg Intravenous Q2H PRN Tarry Kos, MD      . multivitamin with minerals tablet 1 tablet  1 tablet Oral Daily Albertine Grates, MD      . ondansetron [facility]) tablet 4 mg  4 mg Oral Q6H PRN Tarry Kos, MD       Or  . ondansetron [facility]) injection 4 mg  4 mg Intravenous Q6H PRN Tarry Kos, MD      . QUEtiapine (SEROQUEL) tablet 12.5 mg  12.5 mg Oral BID Tarry Kos, MD   12.5 mg at 10/19/17 2112  . sertraline (ZOLOFT) tablet 100 mg  100 mg Oral Daily Tarry Kos, MD   100 mg at 10/20/17 1051  . sodium phosphate (FLEET) 7-19 GM/118ML enema 1 enema  1 enema Rectal Once PRN Tarry Kos,  MD      . tranexamic acid (CYKLOKAPRON) 2,000 mg in sodium chloride 0.9 % 50 mL Topical Application  2,000 mg Topical Once Tarry Kos, MD      . traZODone (DESYREL) tablet 50 mg  50 mg Oral QHS Tarry Kos, MD   50  mg at 10/19/17 2112     Discharge Medications: Please see discharge summary for a list of discharge medications.  Relevant Imaging Results:  Relevant Lab Results:   Additional Information SS# [id number] [place], [place]

## 2017-10-20 NOTE — Progress Notes (Signed)
Patient was assisted to the bedside commode with staff to urinate and tolerated well. Patient worked with PT and OT to ambulate with front wheel walker and staff support. Patient was assisted to the chair where she sat alongside her daughters with the chair alarm and tolerated well. While in bed patient preferred to be in the supine position where she rested. Patient will continue to be monitored.

## 2017-10-20 NOTE — Progress Notes (Signed)
PROGRESS NOTE  Heather KULKARNI BJY:[id number] DOB: 01-22-1933 DOA: 10/18/2017 PCP: Daisy Floro, MD  HPI/Recap of past 24 hours:  POD#1 Patient is pleasantly demented, not able to provide reliable history She does not seem in distress Daughter at bedside  Assessment/Plan: Principal Problem:   Left displaced femoral neck fracture (HCC) Active Problems:   Accelerated hypertension   Subdural hematoma (HCC)   Hyperlipidemia   RLS (restless legs syndrome)   DJD (degenerative joint disease) of cervical spine   Hypokalemia   Abnormality of gait   Dementia   Palliative care encounter   Left femoral neck fracture -partial hip replacement on 5/6 -pain control ,DVT prophylaxis, wound care and activity per ortho -ortho input appreciated  Hypokalemia Improved,  Give another dose kcl orally this am Mag 1.9 Repeat lab in am  HTN: continue home meds, adjust as needed  Dementia -at risk of delirium -Family at bedside assist care  Code Status: DNR  Family Communication: patient and daughter at bedside  Disposition Plan: pending PT eval snf vs home with home health (per daughter patient has 24/7 care at home)   Consultants:  Orthopedics  Palliative care  Procedures:  partial hip replacement on 5/6  Antibiotics:  periopeative   Objective: BP (!) 131/46 (BP Location: Left Arm)   Pulse 94   Temp 98.3 F (36.8 C) (Oral)   Resp 17   Wt 49 kg (108 lb)   SpO2 91%   BMI 19.75 kg/m   Intake/Output Summary (Last 24 hours) at 10/20/2017 1008 Last data filed at 10/20/2017 0865 Gross per 24 hour  Intake 2153.75 ml  Output 2000 ml  Net 153.75 ml   Filed Weights   10/18/17 0018  Weight: 49 kg (108 lb)    Exam: Patient is examined daily including today on 10/20/2017, exams remain the same as of yesterday except that has changed    General:  NAD, frail, thin, demented  Cardiovascular: RRR  Respiratory: CTABL  Abdomen: Soft/ND/NT, positive  BS  Musculoskeletal: left hip post op changes  Neuro: alert, oriented to person  Data Reviewed: Basic Metabolic Panel: Recent Labs  Lab 10/18/17 0410 10/18/17 0927 10/18/17 1947 10/19/17 0415 10/20/17 0358  NA 137  --   --  140 141  K 2.8* 2.3* 2.4* 3.1* 3.7  CL 92*  --   --  99* 102  CO2 29  --   --  32 31  GLUCOSE 179*  --   --  95 120*  BUN 9  --   --  7 7  CREATININE 0.71 0.75  --  0.79 0.71  CALCIUM 9.2  --   --  8.0* 8.2*  MG  --   --   --   --  1.9   Liver Function Tests: No results for input(s): AST, ALT, ALKPHOS, BILITOT, PROT, ALBUMIN in the last 168 hours. No results for input(s): LIPASE, AMYLASE in the last 168 hours. No results for input(s): AMMONIA in the last 168 hours. CBC: Recent Labs  Lab 10/18/17 0410 10/18/17 1947 10/19/17 0415 10/20/17 0358  WBC 11.2* 9.7 8.8 10.3  NEUTROABS 9.4*  --   --   --   HGB 15.7* 14.1 12.5 10.8*  HCT 42.4 40.2 36.1 31.9*  MCV 80.6 84.3 84.9 84.8  PLT 202 192 162 160   Cardiac Enzymes:   No results for input(s): CKTOTAL, CKMB, CKMBINDEX, TROPONINI in the last 168 hours. BNP (last 3 results) Recent Labs    03/16/17  0919  BNP 53.5    ProBNP (last 3 results) No results for input(s): PROBNP in the last 8760 hours.  CBG: No results for input(s): GLUCAP in the last 168 hours.  Recent Results (from the past 240 hour(s))  MRSA PCR Screening     Status: None   Collection Time: 10/18/17 10:05 AM  Result Value Ref Range Status   MRSA by PCR NEGATIVE NEGATIVE Final    Comment:        The GeneXpert MRSA Assay (FDA approved for NASAL specimens only), is one component of a comprehensive MRSA colonization surveillance program. It is not intended to diagnose MRSA infection nor to guide or monitor treatment for MRSA infections. Performed at [facility], [address] [place], [place] [address]   Culture, Urine     Status: None   Collection Time: 10/18/17  7:32 PM  Result Value Ref Range Status    Specimen Description URINE, CATHETERIZED  Final   Special Requests NONE  Final   Culture   Final    NO GROWTH Performed at [facility], [address] [place], [place] [address]    Report Status 10/19/2017 FINAL  Final  Urine culture     Status: None   Collection Time: 10/18/17  7:35 PM  Result Value Ref Range Status   Specimen Description URINE, CATHETERIZED  Final   Special Requests NONE  Final   Culture   Final    NO GROWTH Performed at [facility], [address] [place], [place] [address]    Report Status 10/19/2017 FINAL  Final     Studies: Pelvis Portable  Result Date: 10/19/2017 CLINICAL DATA:  84 year old female status post bipolar left hip replacement. EXAM: PORTABLE PELVIS 1-2 VIEWS COMPARISON:  Intraoperative radiographs obtained earlier today FINDINGS: Interval postsurgical changes of left bipolar hip arthroplasty prosthesis placement. No evidence of immediate hardware complication. The fractured femoral head and neck have been surgically removed. The visualized bony pelvis is intact. Atherosclerotic calcifications present in the bilateral superficial femoral arteries. IMPRESSION: 1. Surgical changes of left hip arthroplasty without evidence of immediate hardware complication. 2. Bilateral superficial femoral artery atherosclerotic vascular calcifications. Electronically Signed   By: Malachy Moan M.D.   On: 10/19/2017 17:07   Dg C-arm 1-60 Min  Result Date: 10/19/2017 CLINICAL DATA:  Status post left hip arthroplasty. EXAM: OPERATIVE LEFT HIP (WITH PELVIS IF PERFORMED) 3 VIEWS TECHNIQUE: Fluoroscopic spot image(s) were submitted for interpretation post-operatively. COMPARISON:  10/18/2017 FINDINGS: The left hip arthroplasty appears well seated and well aligned on the provided images. There is no acute fracture or evidence of an operative complication. IMPRESSION: Well-positioned total left hip arthroplasty. Electronically Signed   By: Amie Portland M.D.   On:  10/19/2017 16:31   Dg Hip Operative Unilat W Or W/o Pelvis Left  Result Date: 10/19/2017 CLINICAL DATA:  Status post left hip arthroplasty. EXAM: OPERATIVE LEFT HIP (WITH PELVIS IF PERFORMED) 3 VIEWS TECHNIQUE: Fluoroscopic spot image(s) were submitted for interpretation post-operatively. COMPARISON:  10/18/2017 FINDINGS: The left hip arthroplasty appears well seated and well aligned on the provided images. There is no acute fracture or evidence of an operative complication. IMPRESSION: Well-positioned total left hip arthroplasty. Electronically Signed   By: Amie Portland M.D.   On: 10/19/2017 16:31    Scheduled Meds: . acidophilus  1 capsule Oral Daily  . amLODipine  10 mg Oral Daily  . docusate sodium  100 mg Oral BID  . enoxaparin (LOVENOX)  injection  40 mg Subcutaneous Q24H  . feeding supplement (ENSURE ENLIVE)  237 mL Oral TID BM  . ketorolac  7.5 mg Intravenous Q6H  . lisinopril  40 mg Oral Daily  . memantine  28 mg Oral Daily  . potassium chloride  20 mEq Oral Once  . QUEtiapine  12.5 mg Oral BID  . sertraline  100 mg Oral Daily  . tranexamic acid (CYKLOKAPRON) topical -INTRAOP  2,000 mg Topical Once  . traZODone  50 mg Oral QHS    Continuous Infusions: . sodium chloride    . lactated ringers 10 mL/hr at 10/19/17 1417     Time spent: 15 mins Floyd have personally reviewed and interpreted on  10/20/2017 daily labs, tele strips, imagings as discussed above under date review session and assessment and plans.  Floyd reviewed all nursing notes, pharmacy notes, consultant notes,  vitals, pertinent old records  Floyd have discussed plan of care as described above with RN , patient and family on 10/20/2017   Albertine Grates MD, PhD  [facility] Pager [phone]. If 7PM-7AM, please contact night-coverage at [email or web address], password [email or web address] 10/20/2017, 10:08 AM  LOS: 2 days

## 2017-10-20 NOTE — Progress Notes (Signed)
Subjective: 1 Day Post-Op Procedure(s) (LRB): ANTERIOR HEMI HIP (Left) Patient reports pain as mild.  Seems to be doing well this am.  No complaints  Objective: Vital signs in last 24 hours: Temp:  [97.3 F (36.3 C)-98.9 F (37.2 C)] 98.3 F (36.8 C) (05/07 0634) Pulse Rate:  [89-94] 94 (05/07 0634) Resp:  [11-22] 17 (05/06 2100) BP: (131-163)/(46-88) 131/46 (05/07 0634) SpO2:  [88 %-95 %] 91 % (05/07 0634)  Intake/Output from previous day: 05/06 0701 - 05/07 0700 In: 2153.8 [P.O.:120; I.V.:1733.8; IV Piggyback:300] Out: 2000 [Urine:1850; Blood:150] Intake/Output this shift: No intake/output data recorded.  Recent Labs    10/18/17 0410 10/18/17 1947 10/19/17 0415 10/20/17 0358  HGB 15.7* 14.1 12.5 10.8*   Recent Labs    10/19/17 0415 10/20/17 0358  WBC 8.8 10.3  RBC 4.25 3.76*  HCT 36.1 31.9*  PLT 162 160   Recent Labs    10/19/17 0415 10/20/17 0358  NA 140 141  K 3.1* 3.7  CL 99* 102  CO2 32 31  BUN 7 7  CREATININE 0.79 0.71  GLUCOSE 95 120*  CALCIUM 8.0* 8.2*   Recent Labs    10/18/17 0927  INR 1.12    Neurovascular intact Sensation intact distally Intact pulses distally Dorsiflexion/Plantar flexion intact Incision: dressing C/D/I No cellulitis present Compartment soft    Assessment/Plan: 1 Day Post-Op Procedure(s) (LRB): ANTERIOR HEMI HIP (Left) Up with therapy  WBAT LLE-no precautions ABLA-mild and stable Continue plan per medicine    Cristie Hem 10/20/2017, 7:32 AM

## 2017-10-21 ENCOUNTER — Inpatient Hospital Stay (HOSPITAL_COMMUNITY): Payer: Medicare Other

## 2017-10-21 DIAGNOSIS — I10 Essential (primary) hypertension: Secondary | ICD-10-CM

## 2017-10-21 DIAGNOSIS — F0391 Unspecified dementia with behavioral disturbance: Secondary | ICD-10-CM

## 2017-10-21 DIAGNOSIS — R509 Fever, unspecified: Secondary | ICD-10-CM

## 2017-10-21 LAB — CBC
HCT: 31.1 % — ABNORMAL LOW (ref 36.0–46.0)
Hemoglobin: 10.3 g/dL — ABNORMAL LOW (ref 12.0–15.0)
MCH: 28.8 pg (ref 26.0–34.0)
MCHC: 33.1 g/dL (ref 30.0–36.0)
MCV: 86.9 fL (ref 78.0–100.0)
PLATELETS: 152 10*3/uL (ref 150–400)
RBC: 3.58 MIL/uL — AB (ref 3.87–5.11)
RDW: 13.7 % (ref 11.5–15.5)
WBC: 8.2 10*3/uL (ref 4.0–10.5)

## 2017-10-21 LAB — HEPATIC FUNCTION PANEL
ALBUMIN: 2.3 g/dL — AB (ref 3.5–5.0)
ALT: 9 U/L — AB (ref 14–54)
AST: 24 U/L (ref 15–41)
Alkaline Phosphatase: 50 U/L (ref 38–126)
BILIRUBIN DIRECT: 0.1 mg/dL (ref 0.1–0.5)
Indirect Bilirubin: 0.8 mg/dL (ref 0.3–0.9)
TOTAL PROTEIN: 4.6 g/dL — AB (ref 6.5–8.1)
Total Bilirubin: 0.9 mg/dL (ref 0.3–1.2)

## 2017-10-21 LAB — URINALYSIS, ROUTINE W REFLEX MICROSCOPIC
Bilirubin Urine: NEGATIVE
GLUCOSE, UA: NEGATIVE mg/dL
Hgb urine dipstick: NEGATIVE
Ketones, ur: NEGATIVE mg/dL
LEUKOCYTES UA: NEGATIVE
Nitrite: NEGATIVE
PROTEIN: NEGATIVE mg/dL
Specific Gravity, Urine: 1.01 (ref 1.005–1.030)
pH: 6 (ref 5.0–8.0)

## 2017-10-21 LAB — BASIC METABOLIC PANEL
ANION GAP: 8 (ref 5–15)
BUN: 8 mg/dL (ref 6–20)
CO2: 28 mmol/L (ref 22–32)
Calcium: 7.9 mg/dL — ABNORMAL LOW (ref 8.9–10.3)
Chloride: 104 mmol/L (ref 101–111)
Creatinine, Ser: 0.61 mg/dL (ref 0.44–1.00)
GFR calc Af Amer: >60 mL/min (ref >60)
GLUCOSE: 98 mg/dL (ref 65–99)
POTASSIUM: 3.4 mmol/L — AB (ref 3.5–5.1)
Sodium: 140 mmol/L (ref 135–145)

## 2017-10-21 MED ORDER — POTASSIUM CHLORIDE CRYS ER 20 MEQ PO TBCR
30.0000 meq | EXTENDED_RELEASE_TABLET | Freq: Two times a day (BID) | ORAL | Status: DC
  Administered 2017-10-21: 30 meq via ORAL
  Filled 2017-10-21: qty 1

## 2017-10-21 MED ORDER — VANCOMYCIN HCL IN DEXTROSE 1-5 GM/200ML-% IV SOLN
1000.0000 mg | INTRAVENOUS | Status: DC
  Administered 2017-10-21: 1000 mg via INTRAVENOUS
  Filled 2017-10-21: qty 200

## 2017-10-21 MED ORDER — PIPERACILLIN-TAZOBACTAM 3.375 G IVPB
3.3750 g | Freq: Three times a day (TID) | INTRAVENOUS | Status: DC
  Administered 2017-10-21: 3.375 g via INTRAVENOUS
  Filled 2017-10-21 (×3): qty 50

## 2017-10-21 MED ORDER — PIPERACILLIN-TAZOBACTAM 3.375 G IVPB
3.3750 g | Freq: Three times a day (TID) | INTRAVENOUS | Status: DC
  Administered 2017-10-21 – 2017-10-23 (×6): 3.375 g via INTRAVENOUS
  Filled 2017-10-21 (×8): qty 50

## 2017-10-21 MED ORDER — VANCOMYCIN HCL IN DEXTROSE 1-5 GM/200ML-% IV SOLN
1000.0000 mg | INTRAVENOUS | Status: DC
  Administered 2017-10-21 – 2017-10-22 (×2): 1000 mg via INTRAVENOUS
  Filled 2017-10-21 (×3): qty 200

## 2017-10-21 NOTE — Progress Notes (Signed)
PROGRESS NOTE  Heather Floyd GNF:[id number] DOB: 29-Jun-1932 DOA: 10/18/2017 PCP: Daisy Floro, MD  HPI/Recap of past 24 hours:  POD#2 Patient is pleasantly demented, not able to provide reliable history She does not seem in distress Daughter at bedside  Assessment/Plan: Principal Problem:   Left displaced femoral neck fracture (HCC) Active Problems:   Accelerated hypertension   Subdural hematoma (HCC)   Hyperlipidemia   RLS (restless legs syndrome)   DJD (degenerative joint disease) of cervical spine   Hypokalemia   Abnormality of gait   Dementia   Palliative care encounter   Left femoral neck fracture -partial hip replacement on 5/6 -pain control ,DVT prophylaxis, wound care and activity per ortho -ortho input appreciated  Fever Cultures, UA, CXR (XR changes ona?) Van and zosyn Will monitor Fluid to MIVF rate  Hypokalemia Improved,  Given kdur Repeat lab in am  HTN: continue home meds, adjust as needed  Dementia -at risk of delirium -Family at bedside assist care  Code Status: DNR  Family Communication: patient and daughter at bedside  Disposition Plan: pending PT eval snf vs home with home health (per daughter patient has 24/7 care at home)   Consultants:  Orthopedics  Palliative care  Procedures:  partial hip replacement on 5/6  Antibiotics:  periopeative   Objective: BP (!) 152/75 (BP Location: Left Arm)   Pulse 91   Temp 99.7 F (37.6 C) (Oral)   Resp 16   Wt 49 kg (108 lb)   SpO2 94%   BMI 19.75 kg/m   Intake/Output Summary (Last 24 hours) at 10/21/2017 1250 Last data filed at 10/21/2017 0600 Gross per 24 hour  Intake 3101.67 ml  Output 200 ml  Net 2901.67 ml   Filed Weights   10/18/17 0018  Weight: 49 kg (108 lb)    Exam: Patient is examined daily including today on 10/21/2017, exams remain the same as of yesterday except that has changed    General:  NAD, frail, thin, demented  Cardiovascular:  RRR  Respiratory: CTABL  Abdomen: Soft/ND/NT, positive BS  Musculoskeletal: left hip post op changes  Neuro: alert, oriented to person  Data Reviewed: Basic Metabolic Panel: Recent Labs  Lab 10/18/17 0410 10/18/17 0927 10/18/17 1947 10/19/17 0415 10/20/17 0358 10/21/17 0439  NA 137  --   --  140 141 140  K 2.8* 2.3* 2.4* 3.1* 3.7 3.4*  CL 92*  --   --  99* 102 104  CO2 29  --   --  32 31 28  GLUCOSE 179*  --   --  95 120* 98  BUN 9  --   --  CREATININE 0.71 0.75  --  0.79 0.71 0.61  CALCIUM 9.2  --   --  8.0* 8.2* 7.9*  MG  --   --   --   --  1.9  --    Liver Function Tests: Recent Labs  Lab 10/21/17 0709  AST 24  ALT 9*  ALKPHOS 50  BILITOT 0.9  PROT 4.6*  ALBUMIN 2.3*   No results for input(s): LIPASE, AMYLASE in the last 168 hours. No results for input(s): AMMONIA in the last 168 hours. CBC: Recent Labs  Lab 10/18/17 0410 10/18/17 1947 10/19/17 0415 10/20/17 0358 10/21/17 0439  WBC 11.2* 9.7 8.8 10.3 8.2  NEUTROABS 9.4*  --   --   --   --   HGB 15.7* 14.1 12.5 10.8* 10.3*  HCT 42.4 40.2 36.1 31.9*  31.1*  MCV 80.6 84.3 84.9 84.8 86.9  PLT 202 192 162 160 152   Cardiac Enzymes:   No results for input(s): CKTOTAL, CKMB, CKMBINDEX, TROPONINI in the last 168 hours. BNP (last 3 results) Recent Labs    03/16/17 0919  BNP 53.5    ProBNP (last 3 results) No results for input(s): PROBNP in the last 8760 hours.  CBG: No results for input(s): GLUCAP in the last 168 hours.  Recent Results (from the past 240 hour(s))  MRSA PCR Screening     Status: None   Collection Time: 10/18/17 10:05 AM  Result Value Ref Range Status   MRSA by PCR NEGATIVE NEGATIVE Final    Comment:        The GeneXpert MRSA Assay (FDA approved for NASAL specimens only), is one component of a comprehensive MRSA colonization surveillance program. It is not intended to diagnose MRSA infection nor to guide or monitor treatment for MRSA infections. Performed at [facility], [address], [place], [place] [address]   Culture, Urine     Status: None   Collection Time: 10/18/17  7:32 PM  Result Value Ref Range Status   Specimen Description URINE, CATHETERIZED  Final   Special Requests NONE  Final   Culture   Final    NO GROWTH Performed at [facility], [address] [place], [place] [address]    Report Status 10/19/2017 FINAL  Final  Urine culture     Status: None   Collection Time: 10/18/17  7:35 PM  Result Value Ref Range Status   Specimen Description URINE, CATHETERIZED  Final   Special Requests NONE  Final   Culture   Final    NO GROWTH Performed at [facility], [address], [place], [place] [address]    Report Status 10/19/2017 FINAL  Final     Studies: Dg Chest Port 1 View  Result Date: 10/21/2017 CLINICAL DATA:  Status post hip replacement with fevers, initial encounter EXAM: PORTABLE CHEST 1 VIEW COMPARISON:  10/18/2017 FINDINGS: Cardiac shadow is stable. Aortic calcifications are again seen. Bibasilar atelectatic changes are noted left greater than right with associated left-sided pleural effusion. No pneumothorax is seen. No bony abnormality is noted. IMPRESSION: Bibasilar changes as described. Electronically Signed   By: Alcide Clever M.D.   On: 10/21/2017 09:57    Scheduled Meds: . acidophilus  1 capsule Oral Daily  . amLODipine  10 mg Oral Daily  . docusate sodium  100 mg Oral BID  . enoxaparin (LOVENOX) injection  40 mg Subcutaneous Q24H  . feeding supplement (ENSURE ENLIVE)  237 mL Oral TID BM  . lisinopril  40 mg Oral Daily  . memantine  28 mg Oral Daily  . multivitamin with minerals  1 tablet Oral Daily  . QUEtiapine  12.5 mg Oral BID  . sertraline  100 mg Oral Daily  . tranexamic acid (CYKLOKAPRON) topical -INTRAOP  2,000 mg Topical Once  . traZODone  50 mg Oral QHS    Continuous Infusions: . sodium chloride 125 mL/hr at 10/21/17 1232  . lactated ringers 10 mL/hr at 10/19/17 1417     Time  spent: 30  Haydee Salter MD, PhD  [facility] Pager [phone]. If 7PM-7AM, please contact night-coverage at [email or web address], password [facility] 10/21/2017, 12:50 PM  LOS: 3 days

## 2017-10-21 NOTE — Progress Notes (Signed)
Pharmacy Antibiotic Note  Heather Floyd is a 84 y.o. female admitted on 10/18/2017 with left leg pain after fall.  SHe is s/p hip fracture surgery on 10/19/17.  Pharmacy has been consulted for vancomycin and Zosyn dosing for post-op fever.  Renal function stable.  Afebrile, WBC WNL, UA negative.   Plan: Vanc 1gm IV Q24H Zosyn EID 3.375gm IV Q8H Monitor renal fxn, clinical progress, vanc trough as indicated   Weight: 108 lb (49 kg)  Temp (24hrs), Avg:100 F (37.8 C), Min:98.5 F (36.9 C), Max:102.1 F (38.9 C)  Recent Labs  Lab 10/18/17 0410 10/18/17 0927 10/18/17 1947 10/19/17 0415 10/20/17 0358 10/21/17 0439  WBC 11.2*  --  9.7 8.8 10.3 8.2  CREATININE 0.71 0.75  --  0.79 0.71 0.61    Estimated Creatinine Clearance: 41.9 mL/min (by C-G formula based on SCr of 0.61 mg/dL).    Allergies  Allergen Reactions  . Aspirin Other (See Comments)    Makes blood too thin  . Codeine Nausea Only    Ancef 5/6 >> 5/7 Vanc 5/8 >> Zosyn 5/8 >>  5/5 MRSA PCR - negative 5/5 UCx - negative 5/8 BCx -   Clarita Mcelvain D. Laney Potash, PharmD, BCPS, BCCCP Pager:  [phone] 10/21/2017, 1:14 PM

## 2017-10-21 NOTE — Progress Notes (Signed)
Subjective: 2 Days Post-Op Procedure(s) (LRB): ANTERIOR HEMI HIP (Left) Patient reports pain as mild.  Resting comfortably in bed.  Had a fever of 102 overnight.  Currently getting worked up for this.  Objective: Vital signs in last 24 hours: Temp:  [98.5 F (36.9 C)-102.1 F (38.9 C)] 99.7 F (37.6 C) (05/08 0601) Pulse Rate:  [82-91] 91 (05/08 0456) Resp:  [16] 16 (05/08 0456) BP: (145-153)/(70-78) 152/75 (05/08 0456) SpO2:  [94 %-96 %] 94 % (05/07 2135)  Intake/Output from previous day: 05/07 0701 - 05/08 0700 In: 3101.7 [P.O.:1935; I.V.:1166.7] Out: 200 [Urine:200] Intake/Output this shift: No intake/output data recorded.  Recent Labs    10/18/17 1947 10/19/17 0415 10/20/17 0358 10/21/17 0439  HGB 14.1 12.5 10.8* 10.3*   Recent Labs    10/20/17 0358 10/21/17 0439  WBC 10.3 8.2  RBC 3.76* 3.58*  HCT 31.9* 31.1*  PLT 160 152   Recent Labs    10/20/17 0358 10/21/17 0439  NA 141 140  K 3.7 3.4*  CL 102 104  CO2 31 28  BUN 7 8  CREATININE 0.71 0.61  GLUCOSE 120* 98  CALCIUM 8.2* 7.9*   Recent Labs    10/18/17 0927  INR 1.12    Neurovascular intact Sensation intact distally Intact pulses distally Dorsiflexion/Plantar flexion intact Incision: dressing C/D/I No cellulitis present Compartment soft      Assessment/Plan: 2 Days Post-Op Procedure(s) (LRB): ANTERIOR HEMI HIP (Left) Up with therapy  WBAT LLE-no precautions F/U with Dr.Xu (ortho) 2 weeks post-op  Cristie Hem 10/21/2017, 8:21 AM

## 2017-10-21 NOTE — Progress Notes (Addendum)
Rehab Admissions Coordinator Note:  Patient was screened by Clois Dupes for appropriateness for an Inpatient Acute Rehab Consult per PT recommendation. It is unlikely that [facility] will approve an inpt rehab admission for this diagnosis. If family and MD would like to pursue assessment for admit to CIR, please place order for rehab consult.Charline Bills discussed with RN CM, Heather, by phone as prescreen was not initiated by eval yesterday.   I received a call from pt's daughter, [name]. She is requesting rehab consult to assess her Mom for CIR admit. I explained the process and how unlikely it would be that insurance would approve. She is requesting our rehab MD to assess pt at bedside for admit. I contacted RN CM to place order for consult. Daughter also requesting contact with SW to follow up on her other options. I will follow up after Rehab MD assessment.  Clois Dupes 10/21/2017, 3:57 PM  I can be reached at [phone].

## 2017-10-21 NOTE — Social Work (Signed)
CSW spoke with pt daughter Juel Burrow for 15 minutes regarding recommendations, some of the SNF offers for pt, and the process for SNF in case CIR is unable to accept pt/insurance does not approve CIR. Pt daughter afraid her mother will not be safe in SNF setting, would like locked memory care unit that would have appropriate supports for pt with dementia.   CSW explained that most traditional SNFs do not offer 24/7 individual sitters in the room. Pt families often have personal sitters at SNFs in order to make sure pts are safe but still received therapies and services. Pt daughter worried about which facilities are going to be able to meet care needs and the financials involved with making sure that pt has wrap around safety at discharge. Pt currently has aide at home which is there when pt daughters are at work.  CSW to reach out to SNFs to assess ability to meet pt needs. CSW did explain sitter/telesitter guidelines for discharge to pt daughter, pt still currently has Cabin crew.   Doy Hutching, LCSWA [facility] Clinical Social Work [phone]

## 2017-10-21 NOTE — Progress Notes (Signed)
Physical Therapy Treatment Patient Details Name: Heather Floyd MRN: [id number] DOB: [birth year] Today's Date: 10/21/2017    History of Present Illness Pt is an 84 y.o. female who sustained L hip fracture after a mechanical fall at home. Pt now s/p anterior approach prosthetic replacement for femoral neck fracture. PMH significant for carotid artery occlusion, concussion with loss of consciousness, degenerative joint disease of cervical spine, hyperlipidemia, hypertension, severe dementia, restless legs syndrome, sleep disorder, TIA, and varicose veins.     PT Comments    Pt with increased soreness this AM as she was quite active yesterday.  She was still able to get up to the recliner chair with two person assist for safety and progress her LE exercises.  He home caregiver was present during the session today.     Follow Up Recommendations  CIR     Equipment Recommendations  None recommended by PT    Recommendations for Other Services Rehab consult     Precautions / Restrictions Precautions Precautions: Fall Restrictions LLE Weight Bearing: Weight bearing as tolerated    Mobility  Bed Mobility Overal bed mobility: Needs Assistance Bed Mobility: Supine to Sit     Supine to sit: Mod assist;HOB elevated     General bed mobility comments: Mod assist to progress bil legs to EOB and support trunk from elevated HOB to get to sitting.   Transfers Overall transfer level: Needs assistance Equipment used: Rolling walker (2 wheeled) Transfers: Sit to/from Stand Sit to Stand: Mod assist;From elevated surface         General transfer comment: Mod assist from elevated bed to RW.  Pt reporting soreness and inability to walk once standing.   Ambulation/Gait Ambulation/Gait assistance: Mod assist;+2 safety/equipment Ambulation Distance (Feet): 8 Feet Assistive device: Rolling walker (2 wheeled) Gait Pattern/deviations: Step-to pattern;Antalgic     General Gait Details: Assit  needed to weight shift and support trunk during stepping/WB on left leg.  Pt limited gait distance today due to increased soreness in her left leg, but still able to get to the recliner chair with two person mod assist.           Balance Overall balance assessment: Needs assistance Sitting-balance support: Feet supported;Bilateral upper extremity supported Sitting balance-Leahy Scale: Fair     Standing balance support: Bilateral upper extremity supported Standing balance-Leahy Scale: Poor                              Cognition Arousal/Alertness: Awake/alert Behavior During Therapy: WFL for tasks assessed/performed Overall Cognitive Status: History of cognitive impairments - at baseline                                 General Comments: h/o dementia, confabulatory at times.       Exercises Total Joint Exercises Ankle Circles/Pumps: AAROM;Both;20 reps Heel Slides: AAROM;Left;10 reps Hip ABduction/ADduction: AAROM;Left;10 reps Long Arc Quad: AAROM;Both;10 reps    General Comments        Pertinent Vitals/Pain Pain Assessment: Faces Faces Pain Scale: Hurts whole lot Pain Location: left hip Pain Descriptors / Indicators: Sore Pain Intervention(s): Limited activity within patient's tolerance;Monitored during session;Repositioned           PT Goals (current goals can now be found in the care plan section) Acute Rehab PT Goals Patient Stated Goal: to go home Progress towards PT goals: Not progressing toward goals -  comment(increased soreness in L leg today)    Frequency    Min 3X/week      PT Plan Current plan remains appropriate       AM-PAC PT "6 Clicks" Daily Activity  Outcome Measure  Difficulty turning over in bed (including adjusting bedclothes, sheets and blankets)?: Unable Difficulty moving from lying on back to sitting on the side of the bed? : Unable Difficulty sitting down on and standing up from a chair with arms (e.g.,  wheelchair, bedside commode, etc,.)?: Unable Help needed moving to and from a bed to chair (including a wheelchair)?: A Lot Help needed walking in hospital room?: A Lot Help needed climbing 3-5 steps with a railing? : Total 6 Click Score: 8    End of Session Equipment Utilized During Treatment: Gait belt Activity Tolerance: Patient limited by fatigue;Patient limited by pain Patient left: in chair;with call bell/phone within reach;with chair alarm set;with nursing/sitter in room(home caregiver is present) Nurse Communication: Mobility status PT Visit Diagnosis: Muscle weakness (generalized) (M62.81);Difficulty in walking, not elsewhere classified (R26.2);Pain Pain - Right/Left: Left Pain - part of body: Leg     Time: 1610-9604 PT Time Calculation (min) (ACUTE ONLY): 21 min  Charges:  $Therapeutic Activity: 8-22 mins          Neeko Pharo B. Appollonia Klee, PT, DPT [phone]            10/21/2017, 11:20 AM

## 2017-10-21 NOTE — Progress Notes (Signed)
Paged Dr. On call about temperature of 102.1 and administered tylenol. Will continue to monitor

## 2017-10-22 ENCOUNTER — Encounter (HOSPITAL_COMMUNITY): Payer: Self-pay | Admitting: General Practice

## 2017-10-22 LAB — CBC
HEMATOCRIT: 31.1 % — AB (ref 36.0–46.0)
HEMOGLOBIN: 10.4 g/dL — AB (ref 12.0–15.0)
MCH: 28.8 pg (ref 26.0–34.0)
MCHC: 33.4 g/dL (ref 30.0–36.0)
MCV: 86.1 fL (ref 78.0–100.0)
Platelets: 188 10*3/uL (ref 150–400)
RBC: 3.61 MIL/uL — AB (ref 3.87–5.11)
RDW: 13.2 % (ref 11.5–15.5)
WBC: 6 10*3/uL (ref 4.0–10.5)

## 2017-10-22 MED ORDER — DOCUSATE SODIUM 100 MG PO CAPS
100.0000 mg | ORAL_CAPSULE | Freq: Two times a day (BID) | ORAL | Status: DC
  Administered 2017-10-22 – 2017-10-23 (×2): 100 mg via ORAL
  Filled 2017-10-22 (×3): qty 1

## 2017-10-22 MED ORDER — POTASSIUM CHLORIDE CRYS ER 20 MEQ PO TBCR
20.0000 meq | EXTENDED_RELEASE_TABLET | Freq: Two times a day (BID) | ORAL | Status: DC
  Administered 2017-10-22 – 2017-10-23 (×3): 20 meq via ORAL
  Filled 2017-10-22 (×3): qty 1

## 2017-10-22 MED ORDER — TRAZODONE HCL 50 MG PO TABS
50.0000 mg | ORAL_TABLET | Freq: Every day | ORAL | Status: DC
  Administered 2017-10-22: 50 mg via ORAL
  Filled 2017-10-22: qty 1

## 2017-10-22 MED ORDER — AMLODIPINE BESYLATE 5 MG PO TABS
5.0000 mg | ORAL_TABLET | Freq: Every day | ORAL | Status: DC
  Administered 2017-10-23: 5 mg via ORAL
  Filled 2017-10-22: qty 1

## 2017-10-22 MED ORDER — QUETIAPINE FUMARATE 50 MG PO TABS
25.0000 mg | ORAL_TABLET | Freq: Every day | ORAL | Status: DC
  Administered 2017-10-22: 25 mg via ORAL
  Filled 2017-10-22: qty 1

## 2017-10-22 NOTE — Progress Notes (Signed)
PROGRESS NOTE  Heather Floyd VOZ:[id number] DOB: 04-03-1933 DOA: 10/18/2017 PCP: Daisy Floro, MD  HPI/Recap of past 24 hours:  POD#3 Patient is pleasantly demented, not able to provide reliable history She denies any discomfort. Daughter at bedside and she confirms this.  Assessment/Plan: Principal Problem:   Left displaced femoral neck fracture (HCC) Active Problems:   Accelerated hypertension   Subdural hematoma (HCC)   Hyperlipidemia   RLS (restless legs syndrome)   DJD (degenerative joint disease) of cervical spine   Hypokalemia   Abnormality of gait   Dementia   Palliative care encounter   Left femoral neck fracture -partial hip replacement on 5/6 -pain control ,DVT prophylaxis, wound care and activity per ortho -ortho input appreciated  Fever Cultures, UA, CXR (XR changes pna?) Zenaida Niece and zosyn Will monitor Fluid to MIVF rate-->stopped to encourage drinking  Hypokalemia (resolved) Improved,  Repeat lab in am  HTN: continue home meds, adjust as needed  Dementia -at risk of delirium -Family at bedside assist care  Code Status: DNR  Family Communication: patient and daughter at bedside  Disposition Plan: SNF   Consultants:  Orthopedics  Palliative care  Procedures:  partial hip replacement on 5/6  Antibiotics:  perioperative   Objective: BP 140/72   Pulse 91   Temp 98.3 F (36.8 C)   Resp 18   Wt 49 kg (108 lb)   SpO2 96%   BMI 19.75 kg/m   Intake/Output Summary (Last 24 hours) at 10/22/2017 1634 Last data filed at 10/22/2017 1500 Gross per 24 hour  Intake 3949 ml  Output 325 ml  Net 3624 ml   Filed Weights   10/18/17 0018  Weight: 49 kg (108 lb)    Exam: Patient is examined daily including today on 10/22/2017, exams remain the same as of yesterday except that has changed    General:  NAD, frail, thin, demented  Cardiovascular: RRR  Respiratory: CTABL  Abdomen: Soft/ND/NT, positive BS  Musculoskeletal: left hip  post op changes; dressing CDI  Neuro: alert, oriented to person  Data Reviewed: Basic Metabolic Panel: Recent Labs  Lab 10/18/17 0410 10/18/17 0927 10/18/17 1947 10/19/17 0415 10/20/17 0358 10/21/17 0439  NA 137  --   --  140 141 140  K 2.8* 2.3* 2.4* 3.1* 3.7 3.4*  CL 92*  --   --  99* 102 104  CO2 29  --   --  32 31 28  GLUCOSE 179*  --   --  95 120* 98  BUN 9  --   --  CREATININE 0.71 0.75  --  0.79 0.71 0.61  CALCIUM 9.2  --   --  8.0* 8.2* 7.9*  MG  --   --   --   --  1.9  --    Liver Function Tests: Recent Labs  Lab 10/21/17 0709  AST 24  ALT 9*  ALKPHOS 50  BILITOT 0.9  PROT 4.6*  ALBUMIN 2.3*   No results for input(s): LIPASE, AMYLASE in the last 168 hours. No results for input(s): AMMONIA in the last 168 hours. CBC: Recent Labs  Lab 10/18/17 0410 10/18/17 1947 10/19/17 0415 10/20/17 0358 10/21/17 0439 10/22/17 0739  WBC 11.2* 9.7 8.8 10.3 8.2 6.0  NEUTROABS 9.4*  --   --   --   --   --   HGB 15.7* 14.1 12.5 10.8* 10.3* 10.4*  HCT 42.4 40.2 36.1 31.9* 31.1* 31.1*  MCV 80.6 84.3 84.9 84.8 86.9  86.1  PLT 202 192 162 160 152 188   Cardiac Enzymes:   No results for input(s): CKTOTAL, CKMB, CKMBINDEX, TROPONINI in the last 168 hours. BNP (last 3 results) Recent Labs    03/16/17 0919  BNP 53.5    ProBNP (last 3 results) No results for input(s): PROBNP in the last 8760 hours.  CBG: No results for input(s): GLUCAP in the last 168 hours.  Recent Results (from the past 240 hour(s))  MRSA PCR Screening     Status: None   Collection Time: 10/18/17 10:05 AM  Result Value Ref Range Status   MRSA by PCR NEGATIVE NEGATIVE Final    Comment:        The GeneXpert MRSA Assay (FDA approved for NASAL specimens only), is one component of a comprehensive MRSA colonization surveillance program. It is not intended to diagnose MRSA infection nor to guide or monitor treatment for MRSA infections. Performed at [facility], [address], [place], [place] [address]   Culture, Urine     Status: None   Collection Time: 10/18/17  7:32 PM  Result Value Ref Range Status   Specimen Description URINE, CATHETERIZED  Final   Special Requests NONE  Final   Culture   Final    NO GROWTH Performed at [facility], [address] [place], [place] [address]    Report Status 10/19/2017 FINAL  Final  Urine culture     Status: None   Collection Time: 10/18/17  7:35 PM  Result Value Ref Range Status   Specimen Description URINE, CATHETERIZED  Final   Special Requests NONE  Final   Culture   Final    NO GROWTH Performed at [facility], [address], [place], [place] [address]    Report Status 10/19/2017 FINAL  Final  Culture, blood (routine x 2)     Status: None (Preliminary result)   Collection Time: 10/21/17  7:15 AM  Result Value Ref Range Status   Specimen Description BLOOD LEFT ANTECUBITAL  Final   Special Requests   Final    BOTTLES DRAWN AEROBIC AND ANAEROBIC Blood Culture adequate volume   Culture   Final    NO GROWTH 1 DAY Performed at [facility], [address] [place], [place] [address]    Report Status PENDING  Incomplete  Culture, blood (routine x 2)     Status: None (Preliminary result)   Collection Time: 10/21/17  7:23 AM  Result Value Ref Range Status   Specimen Description BLOOD LEFT ARM  Final   Special Requests   Final    BOTTLES DRAWN AEROBIC AND ANAEROBIC Blood Culture adequate volume   Culture   Final    NO GROWTH 1 DAY Performed at [facility], [address] [place], [place] [address]    Report Status PENDING  Incomplete     Studies: No results found.  Scheduled Meds: . acidophilus  1 capsule Oral Daily  . [START ON 10/23/2017] amLODipine  5 mg Oral Daily  . docusate sodium  100 mg Oral BID  . enoxaparin (LOVENOX) injection  40 mg Subcutaneous Q24H  . feeding supplement (ENSURE ENLIVE)  237 mL Oral TID BM  . lisinopril  40 mg Oral Daily  . memantine  28 mg Oral  Daily  . multivitamin with minerals  1 tablet Oral Daily  . potassium chloride SA  20 mEq Oral BID  . QUEtiapine  25 mg Oral QPC  supper  . sertraline  100 mg Oral Daily  . tranexamic acid (CYKLOKAPRON) topical -INTRAOP  2,000 mg Topical Once  . traZODone  50 mg Oral QHS    Continuous Infusions: . sodium chloride 10 mL/hr at 10/22/17 1256  . lactated ringers 10 mL/hr at 10/19/17 1417  . piperacillin-tazobactam (ZOSYN)  IV 3.375 g (10/22/17 1333)  . vancomycin Stopped (10/21/17 2053)     Time spent: 71  Haydee Salter MD, PhD  [facility] Pager [phone]. If 7PM-7AM, please contact night-coverage at [email or web address], password [facility] 10/22/2017, 4:34 PM  LOS: 4 days

## 2017-10-22 NOTE — Social Work (Addendum)
CSW alerted by Danne Baxter, with CIR, that she had spoken with pt daughters about CIR screening coming back with the determination of appropriateness for SNF.  CSW met with pt daughters Starlett and Augustin Coupe at bedside, presented offers which included any additional services (wanderguard, locked units etc) at each facility. CSW encouraged pt daughters to visit facilities and speak with them in person with any questions. Again touched on that fact that many families in similar circumstances often use private sitters while they are at facilities to support with monitoring/aide to ensure the pt safety.   Pt will be medically ready for discharge tomorrow per MD, pt will need insurance authorization and the telesitter to be discontinued in order to discharge to SNF.  CSW spoke with pt daughters about coming to a decision before tomorrow in order to ensure insurance authorization and that we cannot keep people while they decide on a facility, encouraged daughters to focus on facilities that have offered which they may have interest in and tour those today.   1:40pm- CSW received confirmation that pt family would like to accept bed offer at [facility]. CSW will leave hand off indicating such. Pt will be able to d/c tomorrow pending 24hrs without physical or telesitter.   CSW will follow for SNF offer and to assist with discharge planning.  Alexander Mt, [place] Work [phone]

## 2017-10-22 NOTE — Care Management Important Message (Signed)
Important Message  Patient Details  Name: Heather Floyd MRN: [id number] Date of Birth: November 15, 1932   Medicare Important Message Given:  Yes    Dorena Bodo 10/22/2017, 2:37 PM

## 2017-10-22 NOTE — Progress Notes (Signed)
I contacted daughter by phone and she is aware that pt is not a candidate for inpt rehab admit. I have alerted RN CM and SW. We will sign off. [phone]

## 2017-10-22 NOTE — Consult Note (Signed)
Physical Medicine and Rehabilitation Consult Reason for Consult: Decreased functional mobility Referring Physician: [facility]   HPI: Heather Floyd is a 84 y.o. right-handed female with history of hypertension, restless leg syndrome and severe dementia maintained on Namenda.  Per chart review patient lives with family as well as has a personal care attendant as needed.  One level home 2 steps to entry.  Ambulated without assistive device prior to admission but needed assistance secondary to dementia.  Presented 10/18/2017 after a witnessed mechanical fall.  No loss of consciousness.  By report she fell backwards hitting her right side of her back falling onto her left hip.  X-rays and imaging revealed left femoral neck fracture and underwent left hip hemiarthroplasty anterior approach 10/19/2017 per Dr. Roda Shutters.  Weightbearing as tolerated.  Hospital course pain management as well as bouts of confusion with noted history of profound dementia.  Subcutaneous Lovenox for DVT prophylaxis.  Acute blood loss anemia 10.3 and monitored.  Patient did spike a low-grade fever 102.1 placed on empiric antibiotics.  Physical and occupational therapy evaluations completed.  MD has requested physical medicine rehab consult.   Review of Systems  Unable to perform ROS: Mental acuity   Past Medical History:  Diagnosis Date  . Abnormality of gait 09/08/2014  . Carotid artery occlusion   . Concussion with loss of consciousness 09/08/2014  . DJD (degenerative joint disease) of cervical spine   . Hyperlipidemia   . Hypertension   . Memory disturbance 03/29/2013  . Osteoporosis   . Personal history of colonic polyps   . RLS (restless legs syndrome)   . Sleep disorder   . TIA (transient ischemic attack)   . Varicose veins    Past Surgical History:  Procedure Laterality Date  . ANTERIOR AND POSTERIOR VAGINAL REPAIR W/ SACROSPINOUS LIGAMENT SUSPENSION    . BREAST REDUCTION SURGERY    . CESAREAN SECTION    .  ENDOVENOUS ABLATION SAPHENOUS VEIN W/ LASER    . HEMORRHOID SURGERY    . TOTAL HIP ARTHROPLASTY Left 10/19/2017   Procedure: ANTERIOR HEMI HIP;  Surgeon: Tarry Kos, MD;  Location: [facility] OR;  Service: Orthopedics;  Laterality: Left;   Family History  Problem Relation Age of Onset  . Mental illness Mother   . Alzheimer's disease Mother   . Cancer Father   . Bone cancer Father   . Dementia Sister    Social History:  reports that she quit smoking about 37 years ago. She has never used smokeless tobacco. She reports that she does not drink alcohol or use drugs. Allergies:  Allergies  Allergen Reactions  . Aspirin Other (See Comments)    Makes blood too thin  . Codeine Nausea Only   Medications Prior to Admission  Medication Sig Dispense Refill  . acetaminophen (TYLENOL) 160 MG/5ML solution Take 480 mg by mouth every 6 (six) hours as needed for moderate pain.    Marland Kitchen amLODipine (NORVASC) 10 MG tablet Take 1 tablet (10 mg total) by mouth daily. (Patient taking differently: Take 5 mg by mouth daily. ) 30 tablet 0  . hydrOXYzine (ATARAX/VISTARIL) 25 MG tablet Take 1 tablet (25 mg total) by mouth 3 (three) times daily as needed. (Patient taking differently: Take 25 mg by mouth 3 (three) times daily as needed for anxiety. ) 90 tablet 3  . lisinopril (PRINIVIL,ZESTRIL) 40 MG tablet Take 40 mg by mouth daily.    . memantine (NAMENDA XR) 28 MG CP24 24 hr capsule Take  1 capsule (28 mg total) by mouth daily. 90 capsule 3  . Probiotic Product (PROBIOTIC PEARLS PO) Take 1 tablet by mouth daily.    . QUEtiapine (SEROQUEL) 25 MG tablet Take 0.5 tablets (12.5 mg total) by mouth 2 (two) times daily. (Patient taking differently: Take 25 mg by mouth at bedtime. ) 30 tablet 5  . sertraline (ZOLOFT) 100 MG tablet Take 1 tablet (100 mg total) by mouth daily. 90 tablet 3  . traZODone (DESYREL) 50 MG tablet Take 1 tablet (50 mg total) by mouth at bedtime. 90 tablet 3    Home: Home Living Family/patient expects to  be discharged to:: Private residence Living Arrangements: Children Available Help at Discharge: Family, Personal care attendant, Other (Comment)(nearly 24 hours/day; daughter at night, PCA during day) Type of Home: House Home Access: Stairs to enter [employer] of Steps: 2 Entrance Stairs-Rails: None Home Layout: One level Bathroom Shower/Tub: Health visitor: Standard Home Equipment: Environmental consultant - 2 wheels, Shower seat Additional Comments: information from daughter and previous admission  Functional History: Prior Function Level of Independence: Needs assistance Gait / Transfers Assistance Needed: Ambulates without assistive devices. ADL's / Homemaking Assistance Needed: Supervision for safety wtih basic ADL. Assist for IADL.  Comments: Daughter home with pt at night. Private personal care attendant 8 hours during the day.  Functional Status:  Mobility: Bed Mobility Overal bed mobility: Needs Assistance Bed Mobility: Supine to Sit Supine to sit: Mod assist, HOB elevated General bed mobility comments: Mod assist to progress bil legs to EOB and support trunk from elevated HOB to get to sitting.  Transfers Overall transfer level: Needs assistance Equipment used: Rolling walker (2 wheeled) Transfers: Sit to/from Stand Sit to Stand: Mod assist, From elevated surface General transfer comment: Mod assist from elevated bed to RW.  Pt reporting soreness and inability to walk once standing.  Ambulation/Gait Ambulation/Gait assistance: Mod assist, +2 safety/equipment Ambulation Distance (Feet): 8 Feet Assistive device: Rolling walker (2 wheeled) Gait Pattern/deviations: Step-to pattern, Antalgic General Gait Details: Assit needed to weight shift and support trunk during stepping/WB on left leg.  Pt limited gait distance today due to increased soreness in her left leg, but still able to get to the recliner chair with two person mod assist.     ADL: ADL Overall ADL's  : Needs assistance/impaired Eating/Feeding: Set up, Sitting Grooming: Moderate assistance, Standing Grooming Details (indicate cue type and reason): assist for balance Upper Body Bathing: Minimal assistance, Sitting Lower Body Bathing: Sit to/from stand, Maximal assistance Upper Body Dressing : Minimal assistance, Sitting Lower Body Dressing: Sit to/from stand, Maximal assistance Toilet Transfer: Moderate assistance, +2 for safety/equipment, Ambulation, BSC, RW Toilet Transfer Details (indicate cue type and reason): BSC over toilet Toileting- Clothing Manipulation and Hygiene: Sit to/from stand, Total assistance Functional mobility during ADLs: Moderate assistance, Rolling walker, +2 for safety/equipment  Cognition: Cognition Overall Cognitive Status: History of cognitive impairments - at baseline Orientation Level: Oriented to person Cognition Arousal/Alertness: Awake/alert Behavior During Therapy: WFL for tasks assessed/performed Overall Cognitive Status: History of cognitive impairments - at baseline General Comments: h/o dementia, confabulatory at times.   Blood pressure 140/72, pulse 91, temperature 98.3 F (36.8 C), resp. rate 18, weight 49 kg (108 lb), SpO2 96 %. Physical Exam  Vitals reviewed. Skin.  Surgical site clean and dry Neurological.  Pleasantly confused.  Bilateral mittens in place for safety  Results for orders placed or performed during the hospital encounter of 10/18/17 (from the past 24 hour(s))  Hepatic function  panel     Status: Abnormal   Collection Time: 10/21/17  7:09 AM  Result Value Ref Range   Total Protein 4.6 (L) 6.5 - 8.1 g/dL   Albumin 2.3 (L) 3.5 - 5.0 g/dL   AST 24 15 - 41 U/L   ALT 9 (L) 14 - 54 U/L   Alkaline Phosphatase 50 38 - 126 U/L   Total Bilirubin 0.9 0.3 - 1.2 mg/dL   Bilirubin, Direct 0.1 0.1 - 0.5 mg/dL   Indirect Bilirubin 0.8 0.3 - 0.9 mg/dL  Urinalysis, Routine w reflex microscopic     Status: None   Collection Time:  10/21/17 10:56 AM  Result Value Ref Range   Color, Urine YELLOW YELLOW   APPearance CLEAR CLEAR   Specific Gravity, Urine 1.010 1.005 - 1.030   pH 6.0 5.0 - 8.0   Glucose, UA NEGATIVE NEGATIVE mg/dL   Hgb urine dipstick NEGATIVE NEGATIVE   Bilirubin Urine NEGATIVE NEGATIVE   Ketones, ur NEGATIVE NEGATIVE mg/dL   Protein, ur NEGATIVE NEGATIVE mg/dL   Nitrite NEGATIVE NEGATIVE   Leukocytes, UA NEGATIVE NEGATIVE   Dg Chest Port 1 View  Result Date: 10/21/2017 CLINICAL DATA:  Status post hip replacement with fevers, initial encounter EXAM: PORTABLE CHEST 1 VIEW COMPARISON:  10/18/2017 FINDINGS: Cardiac shadow is stable. Aortic calcifications are again seen. Bibasilar atelectatic changes are noted left greater than right with associated left-sided pleural effusion. No pneumothorax is seen. No bony abnormality is noted. IMPRESSION: Bibasilar changes as described. Electronically Signed   By: Alcide Clever M.D.   On: 10/21/2017 09:57    84 yo female with dementia who suffered left FNF which required left hip hemi. Recommend SNF.   I have personally performed a face to face diagnostic evaluation of this patient and formulated the key components of the plan.  Additionally, I have personally reviewed laboratory data, imaging studies, as well as relevant notes and concur with the physician assistant's documentation above.  Ranelle Oyster, MD, FAAPMR     Mcarthur Rossetti Angiulli, PA-C 10/22/2017

## 2017-10-22 NOTE — Progress Notes (Signed)
Occupational Therapy Treatment Patient Details Name: Heather Floyd MRN: [id number] DOB: 07-13-1932 Today's Date: 10/22/2017    History of present illness Pt is an 84 y.o. female who sustained L hip fracture after a mechanical fall at home. Pt now s/p anterior approach prosthetic replacement for femoral neck fracture. PMH significant for carotid artery occlusion, concussion with loss of consciousness, degenerative joint disease of cervical spine, hyperlipidemia, hypertension, severe dementia, restless legs syndrome, sleep disorder, TIA, and varicose veins.    OT comments  Pt making progress with functional goals. Pt not a candidate for CIR and planning to d/c to [facility] when medically cleared  Follow Up Recommendations  SNF;Supervision/Assistance - 24 hour    Equipment Recommendations   TBD at next venue of care   Recommendations for Other Services      Precautions / Restrictions Precautions Precautions: Fall Restrictions Weight Bearing Restrictions: Yes LLE Weight Bearing: Weight bearing as tolerated       Mobility Bed Mobility Overal bed mobility: Needs Assistance Bed Mobility: Supine to Sit     Supine to sit: Mod assist;HOB elevated     General bed mobility comments: Mod assist to progress bil legs to EOB and support trunk from elevated HOB to get to sitting.   Transfers Overall transfer level: Needs assistance Equipment used: Rolling walker (2 wheeled) Transfers: Sit to/from Stand Sit to Stand: Mod assist;From elevated surface         General transfer comment: Mod assist from elevated bed to RW.  Pt reporting soreness and inability to walk once standing.     Balance Overall balance assessment: Needs assistance Sitting-balance support: Feet supported;Bilateral upper extremity supported Sitting balance-Leahy Scale: Fair     Standing balance support: Bilateral upper extremity supported;During functional activity Standing balance-Leahy Scale: Poor                              ADL either performed or assessed with clinical judgement   ADL Overall ADL's : Needs assistance/impaired     Grooming: Standing;Minimal assistance   Upper Body Bathing: Min guard;Sitting   Lower Body Bathing: Sit to/from stand;Moderate assistance;Sitting/lateral leans;Maximal assistance   Upper Body Dressing : Sitting;Min guard       Toilet Transfer: Moderate assistance;Ambulation;BSC;RW   Toileting- Clothing Manipulation and Hygiene: Sit to/from stand;Total assistance       Functional mobility during ADLs: Moderate assistance;Rolling walker;+2 for safety/equipment       Vision Patient Visual Report: No change from baseline     Perception     Praxis      Cognition Arousal/Alertness: Awake/alert Behavior During Therapy: WFL for tasks assessed/performed Overall Cognitive Status: History of cognitive impairments - at baseline                                 General Comments: h/o dementia, confabulatory at times.         Exercises     Shoulder Instructions       General Comments      Pertinent Vitals/ Pain       Pain Assessment: Faces Faces Pain Scale: Hurts even more Pain Location: left hip with movement Pain Descriptors / Indicators: Sore Pain Intervention(s): Limited activity within patient's tolerance;Monitored during session;Premedicated before session;Repositioned  Home Living  Prior Functioning/Environment              Frequency  Min 2X/week        Progress Toward Goals  OT Goals(current goals can now be found in the care plan section)  Progress towards OT goals: Progressing toward goals  Acute Rehab OT Goals Patient Stated Goal: to go home  Plan Discharge plan needs to be updated;Frequency remains appropriate    Co-evaluation                 AM-PAC PT "6 Clicks" Daily Activity     Outcome Measure   Help from  another person eating meals?: A Little Help from another person taking care of personal grooming?: A Lot Help from another person toileting, which includes using toliet, bedpan, or urinal?: A Lot Help from another person bathing (including washing, rinsing, drying)?: A Lot Help from another person to put on and taking off regular upper body clothing?: A Little Help from another person to put on and taking off regular lower body clothing?: A Lot 6 Click Score: 14    End of Session Equipment Utilized During Treatment: Gait belt;Rolling walker;Other (comment)(BSC)  OT Visit Diagnosis: Other abnormalities of gait and mobility (R26.89);Pain;Other symptoms and signs involving cognitive function Pain - Right/Left: Left Pain - part of body: Hip   Activity Tolerance Patient tolerated treatment well   Patient Left with call bell/phone within reach;with family/visitor present;in chair;with chair alarm set   Nurse Communication Mobility status    Functional Assessment Tool Used: AM-PAC 6 Clicks Daily Activity   Time: 1610-9604 OT Time Calculation (min): 26 min  Charges: OT G-codes **NOT FOR INPATIENT CLASS** Functional Assessment Tool Used: AM-PAC 6 Clicks Daily Activity OT General Charges $OT Visit: 1 Visit OT Treatments $Self Care/Home Management : 8-22 mins $Therapeutic Activity: 8-22 mins     Galen Manila 10/22/2017, 1:56 PM

## 2017-10-22 NOTE — Progress Notes (Signed)
Pt is pleasantly confused with no indications of agitation or impulsive behavior. Tele sitter ordered discontinued .  Pt is resting comfortably on chair with a family member.

## 2017-10-23 DIAGNOSIS — J189 Pneumonia, unspecified organism: Secondary | ICD-10-CM

## 2017-10-23 LAB — CBC WITH DIFFERENTIAL/PLATELET
Basophils Absolute: 0 10*3/uL (ref 0.0–0.1)
Basophils Relative: 0 %
Eosinophils Absolute: 0.2 10*3/uL (ref 0.0–0.7)
Eosinophils Relative: 4 %
HEMATOCRIT: 30.1 % — AB (ref 36.0–46.0)
Hemoglobin: 10.4 g/dL — ABNORMAL LOW (ref 12.0–15.0)
LYMPHS PCT: 17 %
Lymphs Abs: 0.9 10*3/uL (ref 0.7–4.0)
MCH: 29.6 pg (ref 26.0–34.0)
MCHC: 34.6 g/dL (ref 30.0–36.0)
MCV: 85.8 fL (ref 78.0–100.0)
Monocytes Absolute: 0.6 10*3/uL (ref 0.1–1.0)
Monocytes Relative: 11 %
NEUTROS ABS: 3.7 10*3/uL (ref 1.7–7.7)
Neutrophils Relative %: 68 %
PLATELETS: 218 10*3/uL (ref 150–400)
RBC: 3.51 MIL/uL — AB (ref 3.87–5.11)
RDW: 13.7 % (ref 11.5–15.5)
WBC: 5.4 10*3/uL (ref 4.0–10.5)

## 2017-10-23 LAB — BASIC METABOLIC PANEL
ANION GAP: 10 (ref 5–15)
BUN: 8 mg/dL (ref 6–20)
CO2: 31 mmol/L (ref 22–32)
Calcium: 8.5 mg/dL — ABNORMAL LOW (ref 8.9–10.3)
Chloride: 99 mmol/L — ABNORMAL LOW (ref 101–111)
Creatinine, Ser: 0.61 mg/dL (ref 0.44–1.00)
Glucose, Bld: 87 mg/dL (ref 65–99)
POTASSIUM: 2.9 mmol/L — AB (ref 3.5–5.1)
SODIUM: 140 mmol/L (ref 135–145)

## 2017-10-23 MED ORDER — POTASSIUM CHLORIDE CRYS ER 20 MEQ PO TBCR: 40.0000 meq | EXTENDED_RELEASE_TABLET | Freq: Every day | ORAL | 0 refills | Status: DC

## 2017-10-23 MED ORDER — CEFDINIR 300 MG PO CAPS: 300.0000 mg | ORAL_CAPSULE | Freq: Two times a day (BID) | ORAL | 0 refills | Status: AC

## 2017-10-23 MED ORDER — POTASSIUM CHLORIDE CRYS ER 20 MEQ PO TBCR
20.0000 meq | EXTENDED_RELEASE_TABLET | Freq: Once | ORAL | Status: AC
  Administered 2017-10-23: 20 meq via ORAL
  Filled 2017-10-23: qty 1

## 2017-10-23 NOTE — Clinical Social Work Placement (Signed)
   CLINICAL SOCIAL WORK PLACEMENT  NOTE  Date:  10/23/2017  Patient Details  Name: Heather Floyd MRN: [id number] Date of Birth: 1932-09-13  Clinical Social Work is seeking post-discharge placement for this patient at the Skilled  Nursing Facility level of care (*CSW will initial, date and re-position this form in  chart as items are completed):  Yes   Patient/family provided with [facility] Clinical Social Work Department's list of facilities offering this level of care within the geographic area requested by the patient (or if unable, by the patient's family).  Yes   Patient/family informed of their freedom to choose among providers that offer the needed level of care, that participate in Medicare, Medicaid or managed care program needed by the patient, have an available bed and are willing to accept the patient.  Yes   Patient/family informed of [facility]'s ownership interest in [facility] and [facility], as well as of the fact that they are under no obligation to receive care at these facilities.  PASRR submitted to EDS on       PASRR number received on       Existing PASRR number confirmed on 10/20/17     FL2 transmitted to all facilities in geographic area requested by pt/family on 10/21/17     FL2 transmitted to all facilities within larger geographic area on       Patient informed that his/her managed care company has contracts with or will negotiate with certain facilities, including the following:        Yes   Patient/family informed of bed offers received.  Patient chooses bed at [facility]     Physician recommends and patient chooses bed at      Patient to be transferred to [facility] on 10/23/17.  Patient to be transferred to facility by [facility]     Patient family notified on 10/22/17 of transfer.  Name of family member notified:  Juel Burrow, daughter     PHYSICIAN Please prepare priority discharge summary, including medications     Additional Comment:     _______________________________________________ Maree Krabbe, LCSW 10/23/2017, 11:26 AM

## 2017-10-23 NOTE — Discharge Summary (Addendum)
[facility] Physician Discharge Summary  Heather Floyd NLG:[id number] DOB: 31-Oct-1932 DOA: 10/18/2017  PCP: Lawerance Cruel, MD  Admit date: 10/18/2017 Discharge date: 10/23/2017  Time spent: 30 minutes  Recommendations for Outpatient Follow-up:   1. [facility] Dr. Erlinda Hong follow-up in 2 weeks 2. Finish antibiotics 3. PCP f/u one week 4. SN[facility] at D/C   Discharge Diagnoses:  Principal Problem:   Left displaced femoral neck fracture (HCC) Active Problems:   Hypertension   Hyperlipidemia   RLS (restless legs syndrome)   DJD (degenerative joint disease) of cervical spine   Hypokalemia   Abnormality of gait   Dementia   Palliative care encounter   PNA  Discharge Condition: Stable  Diet recommendation: Regular  Filed Weights   10/18/17 0018  Weight: 49 kg (108 lb)    History of present illness:  HPI: Heather Floyd is a 84 y.o. female with medical history significant for severe dementia, hypertension, hyperlipidemia, restless leg syndrome, degenerative joint disease of the cervical spine and hypokalemia who presents this morning after a fall at home.  Entire history is obtained from the patient's daughter and the chart patient is unable to give any history due to her profound dementia.  She had a witnessed mechanical fall at home early this morning.  She was at the front door and fell backwards hitting her right back in the doorknob and falling onto her left hip.  She complained of some pain at the time.  HER-2 daughters were present were able to get her up and into bed.  Temp to the get up and walk again and fell.  She did not hit her head on that second fall either.  Given her inability to walk family elected to bring her in for further evaluation and management.  They are also concerned because she has a bruise on her low back.  Profoundly demented and unable to give any useful history.  Does not even complain of pain in her hip.     Hospital Course:  She was  scheduled for surgery and had a prosthetic replacement of femoral neck on 5/6.  Patient did well in the postop period except for a fever.  Patient started on fever protocol.  Vancomycin and Zosyn.  Culture sent.  After 2 days cultures came back negative.  Chest x-ray suspicious for pneumonia.  Patient to be discharged on Cefdinir. Briefly needed a sitter in the post op period due to confusion.  Procedures: Prosthetic replacement for femoral neck fracture. CPT [phone] 5/6 Dr. Erlinda Hong (Ortho)  Consultations:  Ortho, Palliative, PMR  Discharge Exam: Vitals:   10/23/17 0535 10/23/17 1218  BP: (!) 173/80 130/79  Pulse: 87 86  Resp:  18  Temp: 98.2 F (36.8 C) 99 F (37.2 C)  SpO2: 96% 98%    General: NAD, NCAT Cardiovascular: RRR, no MRG Respiratory: CTAB, nl wob L hip: dressing CDI, nl rom  Discharge Instructions   Discharge Instructions    Weight bearing as tolerated   Complete by:  As directed      Allergies as of 10/23/2017      Reactions   Aspirin Other (See Comments)   Makes blood too thin   Codeine Nausea Only      Medication List    STOP taking these medications   acetaminophen 160 MG/5ML solution Commonly known as:  TYLENOL     TAKE these medications   amLODipine 10 MG tablet Commonly known as:  NORVASC Take 1 tablet (10  mg total) by mouth daily. What changed:  how much to take   cefdinir 300 MG capsule Commonly known as:  OMNICEF Take 1 capsule (300 mg total) by mouth 2 (two) times daily for 5 days.   enoxaparin 40 MG/0.4ML injection Commonly known as:  LOVENOX Inject 0.4 mLs (40 mg total) into the skin daily.   HYDROcodone-acetaminophen 7.5-325 MG tablet Commonly known as:  NORCO Take 1-2 tablets by mouth every 6 (six) hours as needed for moderate pain.   hydrOXYzine 25 MG tablet Commonly known as:  ATARAX/VISTARIL Take 1 tablet (25 mg total) by mouth 3 (three) times daily as needed. What changed:  reasons to take this   lisinopril 40 MG  tablet Commonly known as:  PRINIVIL,ZESTRIL Take 40 mg by mouth daily.   memantine 28 MG Cp24 24 hr capsule Commonly known as:  NAMENDA XR Take 1 capsule (28 mg total) by mouth daily.   potassium chloride SA 20 MEQ tablet Commonly known as:  K-DUR,KLOR-CON Take 2 tablets (40 mEq total) by mouth daily for 1 day.   PROBIOTIC PEARLS PO Take 1 tablet by mouth daily.   QUEtiapine 25 MG tablet Commonly known as:  SEROQUEL Take 0.5 tablets (12.5 mg total) by mouth 2 (two) times daily. What changed:    how much to take  when to take this   sertraline 100 MG tablet Commonly known as:  ZOLOFT Take 1 tablet (100 mg total) by mouth daily.   traZODone 50 MG tablet Commonly known as:  DESYREL Take 1 tablet (50 mg total) by mouth at bedtime.            Discharge Care Instructions  (From admission, onward)        Start     Ordered   10/19/17 0000  Weight bearing as tolerated     10/19/17 1623     Allergies  Allergen Reactions  . Aspirin Other (See Comments)    Makes blood too thin  . Codeine Nausea Only    Contact information for follow-up providers    Leandrew Koyanagi, MD In 2 weeks.   Specialty:  Orthopedic Surgery Why:  For suture removal, For wound re-check Contact information: [place] [address] [phone]            Contact information for after-discharge care    Destination    [facility] .   Service:  Skilled Nursing Contact information: [address] [phone]                   The results of significant diagnostics from this hospitalization (including imaging, microbiology, ancillary and laboratory) are listed below for reference.    Significant Diagnostic Studies: Dg Chest 1 View  Result Date: 10/18/2017 CLINICAL DATA:  Hip fracture, fall EXAM: CHEST  1 VIEW COMPARISON:  08/25/2017 FINDINGS: Low lung volumes. Probable small left pleural effusion with  basilar atelectasis. Cardiomegaly with aortic atherosclerosis. No pneumothorax. IMPRESSION: Trace left pleural effusion with left basilar atelectasis. Borderline to mild cardiomegaly. Electronically Signed   By: Donavan Foil M.D.   On: 10/18/2017 03:42   Pelvis Portable  Result Date: 10/19/2017 CLINICAL DATA:  84 year old female status post bipolar left hip replacement. EXAM: PORTABLE PELVIS 1-2 VIEWS COMPARISON:  Intraoperative radiographs obtained earlier today FINDINGS: Interval postsurgical changes of left bipolar hip arthroplasty prosthesis placement. No evidence of immediate hardware complication. The fractured femoral head and neck have been surgically removed. The visualized  bony pelvis is intact. Atherosclerotic calcifications present in the bilateral superficial femoral arteries. IMPRESSION: 1. Surgical changes of left hip arthroplasty without evidence of immediate hardware complication. 2. Bilateral superficial femoral artery atherosclerotic vascular calcifications. Electronically Signed   By: Jacqulynn Cadet M.D.   On: 10/19/2017 17:07   Dg Chest Port 1 View  Result Date: 10/21/2017 CLINICAL DATA:  Status post hip replacement with fevers, initial encounter EXAM: PORTABLE CHEST 1 VIEW COMPARISON:  10/18/2017 FINDINGS: Cardiac shadow is stable. Aortic calcifications are again seen. Bibasilar atelectatic changes are noted left greater than right with associated left-sided pleural effusion. No pneumothorax is seen. No bony abnormality is noted. IMPRESSION: Bibasilar changes as described. Electronically Signed   By: Inez Catalina M.D.   On: 10/21/2017 09:57   Dg C-arm 1-60 Min  Result Date: 10/19/2017 CLINICAL DATA:  Status post left hip arthroplasty. EXAM: OPERATIVE LEFT HIP (WITH PELVIS IF PERFORMED) 3 VIEWS TECHNIQUE: Fluoroscopic spot image(s) were submitted for interpretation post-operatively. COMPARISON:  10/18/2017 FINDINGS: The left hip arthroplasty appears well seated and well aligned on  the provided images. There is no acute fracture or evidence of an operative complication. IMPRESSION: Well-positioned total left hip arthroplasty. Electronically Signed   By: Lajean Manes M.D.   On: 10/19/2017 16:31   Dg Hip Unilat W Or Wo Pelvis 1 View Left  Result Date: 10/18/2017 CLINICAL DATA:  Fall with hip pain EXAM: DG HIP (WITH OR WITHOUT PELVIS) 1V*L* COMPARISON:  None. FINDINGS: SI joint degenerative changes. Pubic symphysis and rami are intact. Acute mildly displaced left femoral neck fracture. No femoral head dislocation. IMPRESSION: Acute mildly displaced left femoral neck fracture. Electronically Signed   By: Donavan Foil M.D.   On: 10/18/2017 03:41   Dg Hip Operative Unilat W Or W/o Pelvis Left  Result Date: 10/19/2017 CLINICAL DATA:  Status post left hip arthroplasty. EXAM: OPERATIVE LEFT HIP (WITH PELVIS IF PERFORMED) 3 VIEWS TECHNIQUE: Fluoroscopic spot image(s) were submitted for interpretation post-operatively. COMPARISON:  10/18/2017 FINDINGS: The left hip arthroplasty appears well seated and well aligned on the provided images. There is no acute fracture or evidence of an operative complication. IMPRESSION: Well-positioned total left hip arthroplasty. Electronically Signed   By: Lajean Manes M.D.   On: 10/19/2017 16:31   Dg Femur Min 2 Views Left  Result Date: 10/18/2017 CLINICAL DATA:  Hip fracture, fall EXAM: LEFT FEMUR 2 VIEWS COMPARISON:  None. FINDINGS: Mid to distal femur show no fracture or malalignment. Atherosclerotic vascular disease. Soft tissues are unremarkable IMPRESSION: Negative. Electronically Signed   By: Donavan Foil M.D.   On: 10/18/2017 03:41    Microbiology: Recent Results (from the past 240 hour(s))  MRSA PCR Screening     Status: None   Collection Time: 10/18/17 10:05 AM  Result Value Ref Range Status   MRSA by PCR NEGATIVE NEGATIVE Final    Comment:        The GeneXpert MRSA Assay (FDA approved for NASAL specimens only), is one component of  a comprehensive MRSA colonization surveillance program. It is not intended to diagnose MRSA infection nor to guide or monitor treatment for MRSA infections. Performed at [facility], [place] [address] [place], [place] [address]   Culture, Urine     Status: None   Collection Time: 10/18/17  7:32 PM  Result Value Ref Range Status   Specimen Description URINE, CATHETERIZED  Final   Special Requests NONE  Final   Culture   Final    NO GROWTH  Performed at [facility], [place] [address] [place], [place] [address]    Report Status 10/19/2017 FINAL  Final  Urine culture     Status: None   Collection Time: 10/18/17  7:35 PM  Result Value Ref Range Status   Specimen Description URINE, CATHETERIZED  Final   Special Requests NONE  Final   Culture   Final    NO GROWTH Performed at [facility], [place] [address], [place], [place] [address]    Report Status 10/19/2017 FINAL  Final  Culture, blood (routine x 2)     Status: None (Preliminary result)   Collection Time: 10/21/17  7:15 AM  Result Value Ref Range Status   Specimen Description BLOOD LEFT ANTECUBITAL  Final   Special Requests   Final    BOTTLES DRAWN AEROBIC AND ANAEROBIC Blood Culture adequate volume   Culture   Final    NO GROWTH 2 DAYS Performed at [facility], [place] [address] [place], [place] [address]    Report Status PENDING  Incomplete  Culture, blood (routine x 2)     Status: None (Preliminary result)   Collection Time: 10/21/17  7:23 AM  Result Value Ref Range Status   Specimen Description BLOOD LEFT ARM  Final   Special Requests   Final    BOTTLES DRAWN AEROBIC AND ANAEROBIC Blood Culture adequate volume   Culture   Final    NO GROWTH 2 DAYS Performed at [facility], [address], [place],  [address]    Report Status PENDING  Incomplete     Labs: Basic Metabolic Panel: Recent Labs  Lab 10/18/17 0410 10/18/17 0355 10/18/17 1947 10/19/17 0415 10/20/17 0358  10/21/17 0439 10/23/17 0541  NA 137  --   --  140 141 140 140  K 2.8* 2.3* 2.4* 3.1* 3.7 3.4* 2.9*  CL 92*  --   --  99* 102 104 99*  CO2 29  --   --  32 31 28 31   GLUCOSE 179*  --   --  95 120* 98 87  BUN 9  --   --  7 7 8 8   CREATININE 0.71 0.75  --  0.79 0.71 0.61 0.61  CALCIUM 9.2  --   --  8.0* 8.2* 7.9* 8.5*  MG  --   --   --   --  1.9  --   --    Liver Function Tests: Recent Labs  Lab 10/21/17 0709  AST 24  ALT 9*  ALKPHOS 50  BILITOT 0.9  PROT 4.6*  ALBUMIN 2.3*   No results for input(s): LIPASE, AMYLASE in the last 168 hours. No results for input(s): AMMONIA in the last 168 hours. CBC: Recent Labs  Lab 10/18/17 0410  10/19/17 0415 10/20/17 0358 10/21/17 0439 10/22/17 0739 10/23/17 0541  WBC 11.2*   < > 8.8 10.3 8.2 6.0 5.4  NEUTROABS 9.4*  --   --   --   --   --  3.7  HGB 15.7*   < > 12.5 10.8* 10.3* 10.4* 10.4*  HCT 42.4   < > 36.1 31.9* 31.1* 31.1* 30.1*  MCV 80.6   < > 84.9 84.8 86.9 86.1 85.8  PLT 202   < > 162 160 152 188 218   < > = values in this interval not displayed.   Cardiac Enzymes: No results for input(s): CKTOTAL, CKMB, CKMBINDEX, TROPONINI in the last 168 hours. BNP: BNP (last 3  results) Recent Labs    03/16/17 0919  BNP 53.5    ProBNP (last 3 results) No results for input(s): PROBNP in the last 8760 hours.  CBG: No results for input(s): GLUCAP in the last 168 hours.     Signed:  Elwin Mocha MD  FACP  [facility] 10/23/2017, 3:40 PM

## 2017-10-23 NOTE — Progress Notes (Signed)
Pt is discharged to [facility] via [facility].  Discharge instructions given to daughter. Prescrition sent to facility per admission nurse.  Report given to Kandace Parkins RN.

## 2017-10-23 NOTE — Clinical Social Work Note (Signed)
Clinical Social Worker facilitated patient discharge including contacting patient family and facility to confirm patient discharge plans.  Clinical information faxed to facility and family agreeable with plan.  CSW arranged ambulance transport via [facility] to [facility].  RN to call [phone] for report prior to discharge.  Clinical Social Worker will sign off for now as social work intervention is no longer needed. Please consult [place] again if new need arises.  [place], [place] [phone]

## 2017-10-23 NOTE — Clinical Social Work Note (Signed)
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 However, facility has to have the d/c summary before pt can go.  [place], [place] [phone]

## 2017-10-26 LAB — CULTURE, BLOOD (ROUTINE X 2)
Culture: NO GROWTH
Culture: NO GROWTH
SPECIAL REQUESTS: ADEQUATE
Special Requests: ADEQUATE

## 2017-11-03 ENCOUNTER — Ambulatory Visit (INDEPENDENT_AMBULATORY_CARE_PROVIDER_SITE_OTHER): Payer: Medicare Other | Admitting: Orthopaedic Surgery

## 2017-11-03 ENCOUNTER — Encounter (INDEPENDENT_AMBULATORY_CARE_PROVIDER_SITE_OTHER): Payer: Self-pay | Admitting: Orthopaedic Surgery

## 2017-11-03 ENCOUNTER — Ambulatory Visit (INDEPENDENT_AMBULATORY_CARE_PROVIDER_SITE_OTHER): Payer: Medicare Other

## 2017-11-03 DIAGNOSIS — M25552 Pain in left hip: Secondary | ICD-10-CM

## 2017-11-03 DIAGNOSIS — S72002A Fracture of unspecified part of neck of left femur, initial encounter for closed fracture: Secondary | ICD-10-CM

## 2017-11-03 NOTE — Progress Notes (Signed)
Post-Op Visit Note   Patient: Heather Floyd           Date of Birth: [birth year]           MRN: [id number] Visit Date: 11/03/2017 PCP: Daisy Floro, MD   Assessment & Plan:  Chief Complaint:  Chief Complaint  Patient presents with  . Left Hip - Routine Post Op    10/19/17 anterior hemi hip   Visit Diagnoses:  1. Pain in left hip   2. Left displaced femoral neck fracture (HCC)     Plan: Patient is two-week status post left partial hip replacement.  She is doing very well.  Her dementia is quite advanced.  She does not remember having surgery therefore she is able to participate with physical therapy quite well.  She is actually progressing quite well.  Her incision is healed.  Sutures were removed.  X-rays show stable partial hip replacement.  At this point continue with physical therapy for strengthening and progression and mobilization.  We will see her back in 4 weeks with 2 view x-rays of left hip.  Follow-Up Instructions: Return in about 1 month (around 12/01/2017).   Orders:  Orders Placed This Encounter  Procedures  . XR HIP UNILAT W OR W/O PELVIS 2-3 VIEWS LEFT   No orders of the defined types were placed in this encounter.   Imaging: Xr Hip Unilat W Or W/o Pelvis 2-3 Views Left  Result Date: 11/03/2017 Stable left partial hip replacement   PMFS History: Patient Active Problem List   Diagnosis Date Noted  . Left displaced femoral neck fracture (HCC) 10/18/2017  . Palliative care encounter   . Colitis 08/25/2017  . Abdominal pain 04/08/2017  . Leukocytosis 04/08/2017  . Dementia 04/08/2017  . Dehydration 04/08/2017  . Abnormal urinalysis 04/08/2017  . Enteritis 04/07/2017  . Abnormality of gait 09/08/2014  . Concussion with loss of consciousness 09/08/2014  . Subdural hematoma (HCC) 08/11/2014  . Hypokalemia 08/11/2014  . Patellar fracture 08/11/2014  . SDH (subdural hematoma) (HCC) 08/11/2014  . Hyperlipidemia   . Hypertension   . RLS (restless  legs syndrome)   . DJD (degenerative joint disease) of cervical spine   . TIA (transient ischemic attack)   . Carotid artery occlusion   . Essential hypertension   . Memory disturbance 03/29/2013  . Near syncope 01/13/2013  . Accelerated hypertension 01/13/2013  . Hyperkalemia 01/13/2013   Past Medical History:  Diagnosis Date  . Abnormality of gait 09/08/2014  . Carotid artery occlusion   . Concussion with loss of consciousness 09/08/2014  . DJD (degenerative joint disease) of cervical spine   . Hyperlipidemia   . Hypertension   . Memory disturbance 03/29/2013  . Osteoporosis   . Personal history of colonic polyps   . RLS (restless legs syndrome)   . Sleep disorder   . TIA (transient ischemic attack)   . Varicose veins     Family History  Problem Relation Age of Onset  . Mental illness Mother   . Alzheimer's disease Mother   . Cancer Father   . Bone cancer Father   . Dementia Sister     Past Surgical History:  Procedure Laterality Date  . ANTERIOR AND POSTERIOR VAGINAL REPAIR W/ SACROSPINOUS LIGAMENT SUSPENSION    . CESAREAN SECTION    . ENDOVENOUS ABLATION SAPHENOUS VEIN W/ LASER    . HEMORRHOID SURGERY    . JOINT REPLACEMENT    . REDUCTION MAMMAPLASTY    . TOTAL  HIP ARTHROPLASTY Left 10/19/2017   Procedure: ANTERIOR HEMI HIP;  Surgeon: Tarry Kos, MD;  Location: [facility] OR;  Service: Orthopedics;  Laterality: Left;   Social History   Occupational History  . Occupation: retired  Tobacco Use  . Smoking status: Former Smoker    Last attempt to quit: 06/16/1980    Years since quitting: 37.4  . Smokeless tobacco: Never Used  Substance and Sexual Activity  . Alcohol use: No  . Drug use: No  . Sexual activity: Not on file

## 2017-11-19 ENCOUNTER — Ambulatory Visit: Payer: Medicare Other | Admitting: Podiatry

## 2017-12-02 ENCOUNTER — Ambulatory Visit (INDEPENDENT_AMBULATORY_CARE_PROVIDER_SITE_OTHER): Payer: Medicare Other | Admitting: Orthopaedic Surgery

## 2018-04-07 ENCOUNTER — Ambulatory Visit: Payer: Medicare Other | Admitting: Adult Health

## 2018-04-07 ENCOUNTER — Encounter: Payer: Self-pay | Admitting: Adult Health

## 2018-04-07 VITALS — BP 115/65 | HR 102 | Ht 62.0 in | Wt 110.6 lb

## 2018-04-07 DIAGNOSIS — F0281 Dementia in other diseases classified elsewhere with behavioral disturbance: Secondary | ICD-10-CM | POA: Diagnosis not present

## 2018-04-07 DIAGNOSIS — G309 Alzheimer's disease, unspecified: Secondary | ICD-10-CM

## 2018-04-07 MED ORDER — QUETIAPINE FUMARATE 50 MG PO TABS: 50.0000 mg | ORAL_TABLET | Freq: Every day | ORAL | 5 refills | Status: DC

## 2018-04-07 MED ORDER — TRAZODONE HCL 50 MG PO TABS: 50.0000 mg | ORAL_TABLET | Freq: Every day | ORAL | 3 refills | Status: DC

## 2018-04-07 NOTE — Patient Instructions (Signed)
Your Plan:  Increase seroquel to 50 mg at bedtime Decrease trazodone to 50 mg at bedtime- may discontinue this in the future If your symptoms worsen or you develop new symptoms please let us know.   Thank you for coming to see [place] at [facility]. I hope we have been able to provide you high quality care today.  You may receive a patient satisfaction survey over the next few weeks. We would appreciate your feedback and comments so that we may continue to improve ourselves and the health of our patients.

## 2018-04-07 NOTE — Progress Notes (Signed)
PATIENT: Heather Floyd DOB: Oct 07, 1932  REASON FOR VISIT: follow up HISTORY FROM: patient  HISTORY OF PRESENT ILLNESS: Today 04/07/18: Heather Floyd is an 84 year old female with a history of progressive memory disturbance.  She returns today for follow-up.  Her daughter reports that things have continuously gotten worse.  They have a caregiver that stays with her during the day but family states that her night.  She states that the patient is up most of the night. she states the patient gets very agitated if told what to do.  She requires assistance with all ADLs.  Reports appetite has decreased.  Reports that they have to crush most of her meds in order to get her to take them.  She feels that Seroquel has given her some benefit.  She does not think trazodone has helped much.  She is no longer taking Namenda.  They are considering placement in a long-term care facility, patient returns today for evaluation.  HISTORY 09/21/17 Heather Floyd is an 84 year old female with a history of progressive memory disturbance.  She returns today for follow-up.  She is on Namenda and is tolerating it well.  Her daughter is with her today.  Reports that the main issue is her behavior.  She states that she cannot take her out in public because she says inappropriate things to people.  She is on Risperdal 0.5 mg taking it 3 times a day but this is offering her no benefit.  She has a daughter that lives with her.  She requires assistance with all ADLs.  Is able to feed herself but they may have to help cut up food if needed.  Daughter reports that she is having hallucinations typically at night.  She reports that the patient believes someone is breaking into a car outside.  In the past the patient has had issues with her behavior and has been to [facility]  Daughter reports that this was beneficial for the patient however in order to get her back there she would have to be taken to the emergency room  and she does not want to do that.  Patient returns today for follow-up.   REVIEW OF SYSTEMS: Out of a complete 14 system review of symptoms, the patient complains only of the following symptoms, and all other reviewed systems are negative.  See HPI  ALLERGIES: Allergies  Allergen Reactions  . Aspirin Other (See Comments)    Makes blood too thin  . Codeine Nausea Only    HOME MEDICATIONS: Outpatient Medications Prior to Visit  Medication Sig Dispense Refill  . amLODipine (NORVASC) 5 MG tablet Take 5 mg by mouth daily.    Marland Kitchen gabapentin (NEURONTIN) 100 MG capsule Take 100 mg by mouth 3 (three) times daily.  3  . hydrOXYzine (ATARAX/VISTARIL) 25 MG tablet Take 1 tablet (25 mg total) by mouth 3 (three) times daily as needed. 90 tablet 3  . lisinopril (PRINIVIL,ZESTRIL) 40 MG tablet Take 40 mg by mouth daily.    . QUEtiapine (SEROQUEL) 25 MG tablet Take 0.5 tablets (12.5 mg total) by mouth 2 (two) times daily. (Patient taking differently: Take 25 mg by mouth at bedtime. ) 30 tablet 5  . sertraline (ZOLOFT) 100 MG tablet Take 1 tablet (100 mg total) by mouth daily. 90 tablet 3  . traZODone (DESYREL) 50 MG tablet Take 1 tablet (50 mg total) by mouth at bedtime. (Patient taking differently: Take 100 mg by mouth at bedtime. ) 90 tablet 3  .  enoxaparin (LOVENOX) 40 MG/0.4ML injection Inject 0.4 mLs (40 mg total) into the skin daily. (Patient not taking: Reported on 04/07/2018) 14 Syringe 0  . HYDROcodone-acetaminophen (NORCO) 7.5-325 MG tablet Take 1-2 tablets by mouth every 6 (six) hours as needed for moderate pain. (Patient not taking: Reported on 04/07/2018) 30 tablet 0  . memantine (NAMENDA XR) 28 MG CP24 24 hr capsule Take 1 capsule (28 mg total) by mouth daily. (Patient not taking: Reported on 04/07/2018) 90 capsule 3  . potassium chloride SA (K-DUR,KLOR-CON) 20 MEQ tablet Take 2 tablets (40 mEq total) by mouth daily for 1 day. 2 tablet 0  . Probiotic Product (PROBIOTIC PEARLS PO) Take 1  tablet by mouth daily.    Marland Kitchen amLODipine (NORVASC) 10 MG tablet Take 1 tablet (10 mg total) by mouth daily. (Patient taking differently: Take 5 mg by mouth daily. ) 30 tablet 0   No facility-administered medications prior to visit.     PAST MEDICAL HISTORY: Past Medical History:  Diagnosis Date  . Abnormality of gait 09/08/2014  . Carotid artery occlusion   . Concussion with loss of consciousness 09/08/2014  . DJD (degenerative joint disease) of cervical spine   . Hyperlipidemia   . Hypertension   . Memory disturbance 03/29/2013  . Osteoporosis   . Personal history of colonic polyps   . RLS (restless legs syndrome)   . Sleep disorder   . TIA (transient ischemic attack)   . Varicose veins     PAST SURGICAL HISTORY: Past Surgical History:  Procedure Laterality Date  . ANTERIOR AND POSTERIOR VAGINAL REPAIR W/ SACROSPINOUS LIGAMENT SUSPENSION    . CESAREAN SECTION    . ENDOVENOUS ABLATION SAPHENOUS VEIN W/ LASER    . HEMORRHOID SURGERY    . JOINT REPLACEMENT    . REDUCTION MAMMAPLASTY    . TOTAL HIP ARTHROPLASTY Left 10/19/2017   Procedure: ANTERIOR HEMI HIP;  Surgeon: Tarry Kos, MD;  Location: [facility] OR;  Service: Orthopedics;  Laterality: Left;    FAMILY HISTORY: Family History  Problem Relation Age of Onset  . Mental illness Mother   . Alzheimer's disease Mother   . Cancer Father   . Bone cancer Father   . Dementia Sister     SOCIAL HISTORY: Social History   Socioeconomic History  . Marital status: Widowed    Spouse name: Not on file  . Number of children: 3  . Years of education: 65  . Highest education level: Not on file  Occupational History  . Occupation: retired  Engineer, production  . Financial resource strain: Not on file  . Food insecurity:    Worry: Not on file    Inability: Not on file  . Transportation needs:    Medical: Not on file    Non-medical: Not on file  Tobacco Use  . Smoking status: Former Smoker    Last attempt to quit: 06/16/1980    Years  since quitting: 37.8  . Smokeless tobacco: Never Used  Substance and Sexual Activity  . Alcohol use: No  . Drug use: No  . Sexual activity: Not on file  Lifestyle  . Physical activity:    Days per week: Not on file    Minutes per session: Not on file  . Stress: Not on file  Relationships  . Social connections:    Talks on phone: Not on file    Gets together: Not on file    Attends religious service: Not on file    Active member  of club or organization: Not on file    Attends meetings of clubs or organizations: Not on file    Relationship status: Not on file  . Intimate partner violence:    Fear of current or ex partner: Not on file    Emotionally abused: Not on file    Physically abused: Not on file    Forced sexual activity: Not on file  Other Topics Concern  . Not on file  Social History Narrative   Lives at home w/ her daughter   Right-handed   Caffeine: coffee in the morning and Coke during the day      PHYSICAL EXAM  Vitals:   04/07/18 1421  BP: 115/65  Pulse: (!) 102  Weight: 110 lb 9.6 oz (50.2 kg)  Height: 5\' 2"  (1.575 m)   Body mass index is 20.23 kg/m.  Generalized: Well developed, in no acute distress   Neurological examination  Mentation: Alert.  Speech is clear but she does not say things related to the conversation at hand. Cranial nerve II-XII: . Extraocular movements were full, visual field were full on confrontational test. Facial sensation and strength were normal. Uvula tongue midline. Head turning and shoulder shrug  were normal and symmetric. Motor: The motor testing reveals 5 over 5 strength of all 4 extremities. Good symmetric motor tone is noted throughout.  Sensory: Sensory testing is intact to soft touch on all 4 extremities. No evidence of extinction is noted.  Coordination: Cerebellar testing reveals good finger-nose-finger and heel-to-shin bilaterally.  Difficulty with directions Gait and station: Gait is normal.  Reflexes: Deep  tendon reflexes are symmetric and normal bilaterally.   DIAGNOSTIC DATA (LABS, IMAGING, TESTING) - I reviewed patient records, labs, notes, testing and imaging myself where available.  Lab Results  Component Value Date   WBC 5.4 10/23/2017   HGB 10.4 (L) 10/23/2017   HCT 30.1 (L) 10/23/2017   MCV 85.8 10/23/2017   PLT 218 10/23/2017      Component Value Date/Time   NA 140 10/23/2017 0541   K 2.9 (L) 10/23/2017 0541   CL 99 (L) 10/23/2017 0541   CO2 31 10/23/2017 0541   GLUCOSE 87 10/23/2017 0541   BUN 8 10/23/2017 0541   CREATININE 0.61 10/23/2017 0541   CALCIUM 8.5 (L) 10/23/2017 0541   PROT 4.6 (L) 10/21/2017 0709   ALBUMIN 2.3 (L) 10/21/2017 0709   AST 24 10/21/2017 0709   ALT 9 (L) 10/21/2017 0709   ALKPHOS 50 10/21/2017 0709   BILITOT 0.9 10/21/2017 0709   GFRNONAA >60 10/23/2017 0541   GFRAA >60 10/23/2017 0541   Lab Results  Component Value Date   CHOL 122 10/01/2014   HDL 20 (L) 10/01/2014   LDLCALC 64 10/01/2014   TRIG 188 (H) 10/01/2014   CHOLHDL 6.1 10/01/2014   Lab Results  Component Value Date   HGBA1C 5.3 09/30/2014   Lab Results  Component Value Date   VITAMINB12 709 03/29/2013   Lab Results  Component Value Date   TSH 1.551 12/11/2015      ASSESSMENT AND PLAN 84 y.o. year old female  has a past medical history of Abnormality of gait (09/08/2014), Carotid artery occlusion, Concussion with loss of consciousness (09/08/2014), DJD (degenerative joint disease) of cervical spine, Hyperlipidemia, Hypertension, Memory disturbance (03/29/2013), Osteoporosis, Personal history of colonic polyps, RLS (restless legs syndrome), Sleep disorder, TIA (transient ischemic attack), and Varicose veins. here with:  1.  Alzheimer's disease  The patient's memory has continued to progress.  We will increase Seroquel to 50 mg at bedtime to see if it offers her any additional benefit.  She will reduce trazodone to 50 mg.  We may discontinue this in the future.  I have  given her several resources to help with potential placement if needed.  Patient is advised that if her symptoms worsen or she develops new symptoms she should let us know.  She will follow-up in 6 months or sooner if needed.  I spent 15 minutes with the patient. 50% of this time was spent discussing plan of care   Butch Penny, MSN, NP-C 04/07/2018, 2:31 PM [facility] [address], [address] [place], [place] [address] [phone]

## 2018-04-07 NOTE — Progress Notes (Signed)
I have read the note, and I agree with the clinical assessment and plan.  Charles K Willis   

## 2018-05-15 ENCOUNTER — Other Ambulatory Visit: Payer: Self-pay | Admitting: Adult Health

## 2018-06-07 ENCOUNTER — Telehealth: Payer: Self-pay | Admitting: Neurology

## 2018-06-07 NOTE — Telephone Encounter (Signed)
Dr. Terrace ArabiaYan verbally agreed to sign letter. Letter completed and Dr. Terrace ArabiaYan Signed. I contacted the patients daughter Bonita QuinLinda and advised the letter is up front ready for pick up. MB RN

## 2018-06-07 NOTE — Telephone Encounter (Signed)
Pts daughter Bonita [place]Linda( [place]) requesting a call stating that the pts insurance is needing a letter from Dr. Anne HahnWillis with specific dates and documents on it, requesting a call to discus further.

## 2018-06-07 NOTE — Telephone Encounter (Signed)
I contacted Bonita QuinLinda ([facility]). She stated she trying to get the patient's mail fwd to her address and the Social Security office is stating a letter from her Neurologist is needed stating the patient cannot handle personal affairs and finances and all mail should be forwarded to her daughter, Tempie HoistLinda Robertson.   I advised Bonita QuinLinda Dr. Anne HahnWillis is out of town until 06/14/2018 and Bonita QuinLinda wanted to know if the covering MD would be willing to sign this letter during Dr. Anne HahnWillis absence.

## 2018-06-28 ENCOUNTER — Telehealth: Payer: Self-pay | Admitting: Adult Health

## 2018-06-28 NOTE — Telephone Encounter (Addendum)
[employer], spoke with pharmacist to inquire about PA needed for hydroxyzine. She stated the patient transferred her prescriptions to [facility] [address] in Dec 2019.  Called [facility] [address], spoke with Liborio Nixon who confirmed PA is needed; the patient did pick up medication using discount card, cost slightly less than $20. Patient has Medicare ID [id number]. Call [phone] for PA.  Called [employer] Rx prescriber PA line, spoke with Ryn and answered clinical questions. The preferred formulary alternatives for anxiety areBuspirone, Cymbalta, lexapro, citalopram. Advised Ryn these are contraindicated because the patient is taking sertraline (Zoloft). Hydroxyzine approved non formulary unntil 06/16/2019 Ref pa [id number]

## 2018-06-28 NOTE — Telephone Encounter (Signed)
Daughter Tempie HoistLinda Robertson calling about Hydroxyzine no longer being covered by insurance. Please call to discuss alternatives.

## 2018-06-29 NOTE — Telephone Encounter (Addendum)
Faxed approval letter to [facility] [address]

## 2018-06-30 NOTE — Telephone Encounter (Addendum)
Called daughter, [name] (on [place]) and informed her that I go Hydroxyzine approved yesterday. She stated it will still cost $60 -65, it is a tier 3 medication. She stated the pharmacist told her there are 5 alternatives or preferred medications: Escitalopram, Buspirone, Duloxetine, Venlafaxine, desloratadine.  [name] stated she doesn't want a new medication prescribed that will interfere with her other medications. I advised the pharmacist would be able to tell her if the medication was contraindicated.  [name] also reported her mother is not sleeping. She tried melatonin, but it was not helping. I confirmed she's taking trazodone 50 mg nightly and seroquel 50 mg nightly. She also takes gabapentin at night for RLS. [name] is asking if there is something else to help her mother sleep.  I advised [name] I will send this message to NP and call her back. She verbalized understanding, appreciation. [facility], [facility] to check on price of Hydroyzine. Byrd Hesselbach ran Rx through; cost is $11.31 for 30 days. I will let [name] know after receiving reply on sleep med from NP.

## 2018-06-30 NOTE — Telephone Encounter (Signed)
Pt daughter is calling stating that she is still waiting on a call back regarding her mothers hydrOXYzine (ATARAX/VISTARIL) 25 MG tablet and she also has other things that she would like to address but would not share what they were she would like to only speak to [facility]. Please advise.

## 2018-07-02 MED ORDER — HYDROXYZINE HCL 25 MG PO TABS: 25.0000 mg | ORAL_TABLET | Freq: Three times a day (TID) | ORAL | 3 refills | Status: AC | PRN

## 2018-07-02 MED ORDER — TRAZODONE HCL 50 MG PO TABS: 75.0000 mg | ORAL_TABLET | Freq: Every day | ORAL | 5 refills | Status: AC

## 2018-07-02 NOTE — Telephone Encounter (Signed)
I called the daughters cell phone no answer. Left another message.   Called the home number- someone picked up the phone and then hung it up.   Call the home number a 2nd time. Daughter answered and advised she was on a conference call and would need to call me back.    Late entry 07/01/17: called daughter cell phone after 5PM. Call went straight to VM.

## 2018-07-02 NOTE — Telephone Encounter (Signed)
The patients daughter called back. Advised that her mother was not sleeping well at night. Up every hour despite taking seroquel and trazodone. Daughter reports that she does sleep during the day. I advised that caregiver should try to keep the patient active during the day. I suggested things such as having her fold towels etc. I will increase trazodone to 1.5 tablets (75 mg) at bedtime. Continue seroquel. They are looking at [facility]. Daughter hoping this well help as well. Advised to call back if needed.

## 2018-07-02 NOTE — Telephone Encounter (Signed)
Pt daughter on [place](Robertson,Linda) has called expressing her disapointment in not hearing back from NP [facility], she states it has been a week with no response.  Daughter states that pt is having trouble sleeping, on 2 different types of sleeping medications.  Daughter states they are unable to afford the hydrOXYzine (ATARAX/VISTARIL) 25 MG tablet  Pt daughter is also asking for a FL2 to be completed as they are considering a nursing facility placement for pt.  Daughter is asking that on the [facility] it be mentioned that pills for pt must be crushed and capsules must be open.  Pt daughter is asking for a call from Dr [name]

## 2018-07-02 NOTE — Addendum Note (Signed)
Addended by: Enedina Finner on: 07/02/2018 02:43 PM   Modules accepted: Orders

## 2018-07-02 NOTE — Telephone Encounter (Signed)
I called daughter no answer. Left a message. Advised I will try again in 5-10 minutes

## 2018-07-07 ENCOUNTER — Telehealth: Payer: Self-pay | Admitting: *Deleted

## 2018-07-07 NOTE — Telephone Encounter (Signed)
Pt daughter, calling about mom FL2 form. Need it turn in by Friday. On the FL2 form, daughter states that pt pills must be crushed and capsules must be open. Please call with update [phone]

## 2018-07-09 ENCOUNTER — Telehealth: Payer: Self-pay | Admitting: Adult Health

## 2018-07-09 NOTE — Telephone Encounter (Signed)
Pts daughter states they will be moving pt to a facility and they are needing the FL2 form filled out but would like to speak to provider before the forms are filled out. Please advise.

## 2018-07-09 NOTE — Telephone Encounter (Signed)
Unable to get in contact with patient's daughter to discuss FL2. I will attempt to call back at a later time.

## 2018-07-12 NOTE — Telephone Encounter (Signed)
Forms given to Dr. [name] to review/advise.

## 2018-07-12 NOTE — Addendum Note (Signed)
Addended by: [place] Pink on: 07/12/2018 09:22 AM   Modules accepted: Orders

## 2018-07-12 NOTE — Telephone Encounter (Signed)
Daughter [name] came to pick up FL2 forms. She stated after looking at it that it may be the wrong form. She will check and let us know.

## 2018-07-12 NOTE — Telephone Encounter (Signed)
Pts daughter called again. States this is urgent and is really needing to speak to someone. Please advise.

## 2018-07-12 NOTE — Telephone Encounter (Signed)
I called the daughter, the FL-2 form has been filled out.  She will come by to pick it up.

## 2018-07-12 NOTE — Telephone Encounter (Addendum)
Called daughter, [place]inda on [place] and apologized for [facility], explained the NP has been out sick. We reviewed FL2 patient information. She had specific things to be noted on FL2, stating they must have details. I reviewed all medications: names/strengths/ doses, directions for taking, and Bonita QuinLinda confirmed all medication information.  Bonita QuinLinda stated she must have all tablets crushed and capsules opened then mixed with small amount of chocolate Ensure. Her mother is moving into memory care this coming Sat, and Bonita QuinLinda must have completed, signed form by then. I advised will get form ready and give to Dr Anne HahnWillis to sign.  She will get a call when it is ready to be picked up.  She  verbalized understanding, appreciation.

## 2018-07-12 NOTE — Telephone Encounter (Signed)
See other phone note. FL2 on Dr Anne HahnWillis'  Desk for review, signature. Megan RN aware.

## 2018-07-22 ENCOUNTER — Other Ambulatory Visit: Payer: Self-pay

## 2018-07-22 ENCOUNTER — Emergency Department (HOSPITAL_COMMUNITY)
Admission: EM | Admit: 2018-07-22 | Discharge: 2018-07-22 | Disposition: A | Discharge status: Home / Self Care | Payer: Medicare Other | Source: Home / Self Care | Attending: Emergency Medicine | Admitting: Emergency Medicine

## 2018-07-22 ENCOUNTER — Emergency Department (HOSPITAL_COMMUNITY): Payer: Medicare Other

## 2018-07-22 ENCOUNTER — Encounter (HOSPITAL_COMMUNITY): Payer: Self-pay

## 2018-07-22 DIAGNOSIS — G309 Alzheimer's disease, unspecified: Secondary | ICD-10-CM

## 2018-07-22 DIAGNOSIS — S0003XA Contusion of scalp, initial encounter: Secondary | ICD-10-CM | POA: Insufficient documentation

## 2018-07-22 DIAGNOSIS — S52032A Displaced fracture of olecranon process with intraarticular extension of left ulna, initial encounter for closed fracture: Secondary | ICD-10-CM | POA: Diagnosis not present

## 2018-07-22 DIAGNOSIS — Y92128 Other place in nursing home as the place of occurrence of the external cause: Secondary | ICD-10-CM

## 2018-07-22 DIAGNOSIS — Z87891 Personal history of nicotine dependence: Secondary | ICD-10-CM

## 2018-07-22 DIAGNOSIS — Y92129 Unspecified place in nursing home as the place of occurrence of the external cause: Principal | ICD-10-CM

## 2018-07-22 DIAGNOSIS — Y9389 Activity, other specified: Secondary | ICD-10-CM

## 2018-07-22 DIAGNOSIS — W19XXXA Unspecified fall, initial encounter: Secondary | ICD-10-CM

## 2018-07-22 DIAGNOSIS — Z79899 Other long term (current) drug therapy: Secondary | ICD-10-CM

## 2018-07-22 DIAGNOSIS — F0280 Dementia in other diseases classified elsewhere without behavioral disturbance: Secondary | ICD-10-CM | POA: Insufficient documentation

## 2018-07-22 DIAGNOSIS — I10 Essential (primary) hypertension: Secondary | ICD-10-CM

## 2018-07-22 DIAGNOSIS — Y999 Unspecified external cause status: Secondary | ICD-10-CM

## 2018-07-22 DIAGNOSIS — R0902 Hypoxemia: Secondary | ICD-10-CM | POA: Diagnosis not present

## 2018-07-22 MED ORDER — ACETAMINOPHEN 160 MG/5ML PO SOLN: 500.0000 mg | Freq: Four times a day (QID) | ORAL | 0 refills | Status: DC | PRN

## 2018-07-22 MED ORDER — ACETAMINOPHEN 160 MG/5ML PO SOLN
500.0000 mg | Freq: Once | ORAL | Status: AC
  Administered 2018-07-22: 500 mg via ORAL
  Filled 2018-07-22: qty 20

## 2018-07-22 NOTE — ED Triage Notes (Signed)
Pt to ED with c/o of an unwitnessed fall. Pt is from [facility] facility and was in the shower this morning, pt fell to the bottom of the shower. Staff was not present but heard it, and found her sitting up in the tub. Pt has hx of alz dementia. When [facility] arrived patient was walking around but complaining of right hip and right knee pain 10/10. No deformities present. Pt daughter is present with her.

## 2018-07-22 NOTE — Discharge Instructions (Addendum)
She can take 500mg  of tylenol every 4-6 hours as needed for pain. Follow up with her primary care provider. Return to the ED if she develops vision changes, severe headache, vomiting, or new or concerning symptoms.

## 2018-07-22 NOTE — ED Notes (Signed)
Bed: [facility] Expected date:  Expected time:  Means of arrival:  Comments: EMS-fall 

## 2018-07-22 NOTE — ED Provider Notes (Signed)
[facility] Note   CSN: [id number] Arrival date & time: 07/22/18  1359     History   Chief Complaint Chief Complaint  Patient presents with  . Fall    HPI Heather Floyd is a 85 y.o. female with past medical history of hypertension, dementia, TIA, presenting to the emergency department from [facility] after mechanical fall.  Patient's daughter states that patient was found in another resident's room after falling.  She was wearing another person's shoes that were too large, and this was likely the cause of her fall.  Fall was not witnessed, however was heard and she was attended to immediately.  Per EMS, patient was ambulating however complaining of right hip and knee pain.  Patient's daughter states she is currently at her baseline and without complaints.  Patient states she thinks she hit her head and points out pain to the right scalp.  Denies abdominal pain, neck or back pain.  The history is provided by a relative, the EMS personnel and the patient. The history is limited by the condition of the patient (Dementia).    Past Medical History:  Diagnosis Date  . Abnormality of gait 09/08/2014  . Carotid artery occlusion   . Concussion with loss of consciousness 09/08/2014  . DJD (degenerative joint disease) of cervical spine   . Hyperlipidemia   . Hypertension   . Memory disturbance 03/29/2013  . Osteoporosis   . Personal history of colonic polyps   . RLS (restless legs syndrome)   . Sleep disorder   . TIA (transient ischemic attack)   . Varicose veins     Patient Active Problem List   Diagnosis Date Noted  . Left displaced femoral neck fracture (HCC) 10/18/2017  . Palliative care encounter   . Colitis 08/25/2017  . Abdominal pain 04/08/2017  . Leukocytosis 04/08/2017  . Dementia (HCC) 04/08/2017  . Dehydration 04/08/2017  . Abnormal urinalysis 04/08/2017  . Enteritis 04/07/2017  . Abnormality of gait 09/08/2014    . Concussion with loss of consciousness 09/08/2014  . Subdural hematoma (HCC) 08/11/2014  . Hypokalemia 08/11/2014  . Patellar fracture 08/11/2014  . SDH (subdural hematoma) (HCC) 08/11/2014  . Hyperlipidemia   . Hypertension   . RLS (restless legs syndrome)   . DJD (degenerative joint disease) of cervical spine   . TIA (transient ischemic attack)   . Carotid artery occlusion   . Essential hypertension   . Memory disturbance 03/29/2013  . Near syncope 01/13/2013  . Accelerated hypertension 01/13/2013  . Hyperkalemia 01/13/2013    Past Surgical History:  Procedure Laterality Date  . ANTERIOR AND POSTERIOR VAGINAL REPAIR W/ SACROSPINOUS LIGAMENT SUSPENSION    . CESAREAN SECTION    . ENDOVENOUS ABLATION SAPHENOUS VEIN W/ LASER    . HEMORRHOID SURGERY    . JOINT REPLACEMENT    . REDUCTION MAMMAPLASTY    . TOTAL HIP ARTHROPLASTY Left 10/19/2017   Procedure: ANTERIOR HEMI HIP;  Surgeon: Tarry Kos, MD;  Location: [facility] OR;  Service: Orthopedics;  Laterality: Left;     OB History   No obstetric history on file.      Home Medications    Prior to Admission medications   Medication Sig Start Date End Date Taking? Authorizing Provider  amLODipine (NORVASC) 5 MG tablet Take 5 mg by mouth daily. 02/01/18  Yes [provider]  gabapentin (NEURONTIN) 100 MG capsule Take 100 mg by mouth 3 (three) times daily. 03/30/18  Yes [provider]  hydrOXYzine (ATARAX/VISTARIL) 25 MG tablet Take 1 tablet (25 mg total) by mouth 3 (three) times daily as needed. 07/02/18  Yes Butch Penny, NP  lisinopril (PRINIVIL,ZESTRIL) 40 MG tablet Take 40 mg by mouth daily.   Yes [provider]  QUEtiapine (SEROQUEL) 50 MG tablet Take 1 tablet (50 mg total) by mouth at bedtime. 04/07/18  Yes Butch Penny, NP  sertraline (ZOLOFT) 100 MG tablet Take 1 tablet (100 mg total) by mouth daily. 09/21/17  Yes Butch Penny, NP  traZODone (DESYREL) 50 MG tablet Take 1.5 tablets (75 mg  total) by mouth at bedtime. 07/02/18  Yes Butch Penny, NP  acetaminophen (TYLENOL) 160 MG/5ML solution Take 15.6 mLs (500 mg total) by mouth every 6 (six) hours as needed for mild pain. 07/22/18   , [place] N, PA-C    Family History Family History  Problem Relation Age of Onset  . Mental illness Mother   . Alzheimer's disease Mother   . Cancer Father   . Bone cancer Father   . Dementia Sister     Social History Social History   Tobacco Use  . Smoking status: Former Smoker    Last attempt to quit: 06/16/1980    Years since quitting: 38.1  . Smokeless tobacco: Never Used  Substance Use Topics  . Alcohol use: No  . Drug use: No     Allergies   Aspirin and Codeine   Review of Systems Review of Systems  Unable to perform ROS: Dementia  Musculoskeletal: Positive for arthralgias.  Neurological: Positive for headaches.     Physical Exam Updated Vital Signs BP 134/78 (BP Location: Right Arm)   Pulse 97   Temp 98.6 F (37 C) (Oral)   Resp 17   Ht 5\' 2"  (1.575 m)   Wt 54.4 kg   SpO2 97%   BMI 21.95 kg/m   Physical Exam Vitals signs and nursing note reviewed.  Constitutional:      General: She is not in acute distress.    Appearance: She is well-developed.     Comments: Patient resting comfortably in bed.  HENT:     Head: Normocephalic.     Comments: There is a 3 cm hematoma to the right frontal parietal scalp with associated localized tenderness.  No crepitus or obvious deformity.  No wounds to the head or scalp.  Face appears atraumatic.  No battle sign or raccoon eyes. Eyes:     Extraocular Movements: Extraocular movements intact.     Conjunctiva/sclera: Conjunctivae normal.     Pupils: Pupils are equal, round, and reactive to light.  Neck:     Musculoskeletal: Normal range of motion.  Cardiovascular:     Rate and Rhythm: Normal rate and regular rhythm.  Pulmonary:     Effort: Pulmonary effort is normal.     Breath sounds: Normal breath sounds.    Abdominal:     General: Bowel sounds are normal.     Palpations: Abdomen is soft.     Tenderness: There is no abdominal tenderness.  Musculoskeletal:     Comments: No midline C, T, or L-spine tenderness, no bony step-offs or gross deformities.  Right anterior knee with superficial abrasion with associated tenderness.  Patient actively ranging bilateral ankles, knees, and hips without complaint.  No deformity.  Skin:    General: Skin is warm.  Neurological:     Mental Status: She is alert. Mental status is at baseline.     Comments: Patient difficult to follow  simple commands, however is redirectable.  Grossly normal coordination.  Normal tone.  Cranial nerves grossly normal.  Psychiatric:        Behavior: Behavior normal.      ED Treatments / Results  Labs (all labs ordered are listed, but only abnormal results are displayed) Labs Reviewed - No data to display  EKG None  Radiology Dg Knee 2 Views Right  Result Date: 07/22/2018 CLINICAL DATA:  Pt to ED with c/o of an unwitnessed fall. Pt is from [facility] facility and was in the shower this morning, pt fell to the bottom of the shower. Staff was not present but heard it, and found her sitting up in the tub. Pt has hx of alz dementia. When [facility] arrived patient was walking around but complaining of right hip and right knee pain 10/10. No deformities present. EXAM: RIGHT KNEE - 1-2 VIEW COMPARISON:  None. FINDINGS: No evidence of fracture, dislocation, or joint effusion. No evidence of arthropathy or other focal bone abnormality. Soft tissues are unremarkable. IMPRESSION: Negative. Electronically Signed   By: Amie Portland M.D.   On: 07/22/2018 15:17   Ct Head Wo Contrast  Result Date: 07/22/2018 CLINICAL DATA:  Unwitnessed fall this morning. History of Alzheimer's dementia. RIGHT hip pain and RIGHT knee pain. EXAM: CT HEAD WITHOUT CONTRAST CT CERVICAL SPINE WITHOUT CONTRAST TECHNIQUE: Multidetector CT imaging of the head and  cervical spine was performed following the standard protocol without intravenous contrast. Multiplanar CT image reconstructions of the cervical spine were also generated. COMPARISON:  Head CT dated 03/16/2017. FINDINGS: CT HEAD FINDINGS Brain: Ventricles are stable in size and configuration. Mild chronic small vessel ischemic change noted in the deep periventricular white matter regions. No mass, hemorrhage, edema or other evidence of acute parenchymal abnormality. No extra-axial hemorrhage. Vascular: Chronic calcified atherosclerotic changes of the large vessels at the skull base. No unexpected hyperdense vessel. Skull: Normal. Negative for fracture or focal lesion. Sinuses/Orbits: No acute finding. Other: Scalp edema overlying the RIGHT frontoparietal bone. No underlying fracture. CT CERVICAL SPINE FINDINGS Alignment: Dextroscoliosis of the cervicothoracic spine, possibly accentuated by patient positioning. No evidence of acute vertebral body subluxation. Skull base and vertebrae: No fracture line or displaced fracture fragment appreciated. Facet joints appear appropriately aligned. Soft tissues and spinal canal: No prevertebral fluid or swelling. No visible canal hematoma. Disc levels: Mild degenerative spondylosis at the C5-6 and C6-7 levels. No significant central canal stenosis at any level. Upper chest: No acute findings. Other: Bilateral carotid atherosclerosis. IMPRESSION: 1. Scalp edema overlying the RIGHT frontoparietal bone. No underlying fracture. 2. No acute intracranial abnormality. No intracranial mass, hemorrhage or edema. No skull fracture. 3. No fracture or acute subluxation within the cervical spine. 4. Carotid atherosclerosis. Electronically Signed   By: Bary Richard M.D.   On: 07/22/2018 16:15   Ct Cervical Spine Wo Contrast  Result Date: 07/22/2018 CLINICAL DATA:  Unwitnessed fall this morning. History of Alzheimer's dementia. RIGHT hip pain and RIGHT knee pain. EXAM: CT HEAD WITHOUT  CONTRAST CT CERVICAL SPINE WITHOUT CONTRAST TECHNIQUE: Multidetector CT imaging of the head and cervical spine was performed following the standard protocol without intravenous contrast. Multiplanar CT image reconstructions of the cervical spine were also generated. COMPARISON:  Head CT dated 03/16/2017. FINDINGS: CT HEAD FINDINGS Brain: Ventricles are stable in size and configuration. Mild chronic small vessel ischemic change noted in the deep periventricular white matter regions. No mass, hemorrhage, edema or other evidence of acute parenchymal abnormality. No extra-axial hemorrhage. Vascular: Chronic  calcified atherosclerotic changes of the large vessels at the skull base. No unexpected hyperdense vessel. Skull: Normal. Negative for fracture or focal lesion. Sinuses/Orbits: No acute finding. Other: Scalp edema overlying the RIGHT frontoparietal bone. No underlying fracture. CT CERVICAL SPINE FINDINGS Alignment: Dextroscoliosis of the cervicothoracic spine, possibly accentuated by patient positioning. No evidence of acute vertebral body subluxation. Skull base and vertebrae: No fracture line or displaced fracture fragment appreciated. Facet joints appear appropriately aligned. Soft tissues and spinal canal: No prevertebral fluid or swelling. No visible canal hematoma. Disc levels: Mild degenerative spondylosis at the C5-6 and C6-7 levels. No significant central canal stenosis at any level. Upper chest: No acute findings. Other: Bilateral carotid atherosclerosis. IMPRESSION: 1. Scalp edema overlying the RIGHT frontoparietal bone. No underlying fracture. 2. No acute intracranial abnormality. No intracranial mass, hemorrhage or edema. No skull fracture. 3. No fracture or acute subluxation within the cervical spine. 4. Carotid atherosclerosis. Electronically Signed   By: Bary RichardStan  Maynard M.D.   On: 07/22/2018 16:15   Dg Hip Unilat  With Pelvis 2-3 Views Right  Result Date: 07/22/2018 CLINICAL DATA:  Pt to ED with c/o  of an unwitnessed fall. Pt is from [facility] and was in the shower this morning, pt fell to the bottom of the shower. Staff was not present but heard it, and found her sitting up in the tub. Pt has hx of alz dementia. When [facility] arrived patient was walking around but complaining of right hip and right knee pain 10/10. No deformities present. EXAM: DG HIP (WITH OR WITHOUT PELVIS) 2-3V RIGHT COMPARISON:  None. FINDINGS: No fracture.  No bone lesion. Left hip prosthesis is well seated and aligned. Right hip joint, SI joints and symphysis pubis are normally spaced and aligned. Bones are demineralized. Soft tissues are unremarkable. IMPRESSION: No fracture, dislocation or acute finding. Electronically Signed   By: Amie Portlandavid  Ormond M.D.   On: 07/22/2018 15:17    Procedures Procedures (including critical care time)  Medications Ordered in ED Medications  acetaminophen (TYLENOL) solution 500 mg (500 mg Oral Given 07/22/18 1648)     Initial Impression / Assessment and Plan / ED Course  I have reviewed the triage vital signs and the nursing notes.  Pertinent labs & imaging results that were available during my care of the patient were reviewed by me and considered in my medical decision making (see chart for details).     [facility].  Per daughter, at baseline, with Alzheimer's dementia.  Not on anticoagulation.  Small hematoma to right scalp and superficial abrasion to right knee.  Patient with minimal complaints in the ED, is well-appearing and not in distress.  No obvious neuro deficits on exam, however patient difficult to follow simple commands.  CT of head and C-spine are without acute pathology.  X-ray of right hip and knee are negative.  Exam is overall reassuring.  Patient's daughter agreeable to discharge with symptomatic management and PCP follow-up.  Patient discussed with and evaluated by Dr. Jeraldine LootsLockwood, who agrees with care  plan.  Discussed results, findings, treatment and follow up. Family advised of return precautions. Family verbalized understanding and agreed with plan.   Final Clinical Impressions(s) / ED Diagnoses   Final diagnoses:  Fall at nursing home, initial encounter  Contusion of scalp, initial encounter    ED Discharge Orders         Ordered    acetaminophen (TYLENOL) 160 MG/5ML solution  Every 6 hours PRN  07/22/18 1700           , [place] N, PA-C 07/22/18 Merrily Brittle    Gerhard Munch, MD 07/22/18 [phone]

## 2018-07-23 ENCOUNTER — Inpatient Hospital Stay (HOSPITAL_COMMUNITY)
Admission: EM | Admit: 2018-07-23 | Discharge: 2018-07-29 | DRG: 500 | Disposition: A | Discharge status: Skilled Nursing Facility | Payer: Medicare Other | Source: Skilled Nursing Facility | Attending: Internal Medicine | Admitting: Internal Medicine

## 2018-07-23 ENCOUNTER — Emergency Department (HOSPITAL_COMMUNITY): Payer: Medicare Other

## 2018-07-23 ENCOUNTER — Encounter (HOSPITAL_COMMUNITY): Payer: Self-pay

## 2018-07-23 DIAGNOSIS — B9620 Unspecified Escherichia coli [E. coli] as the cause of diseases classified elsewhere: Secondary | ICD-10-CM | POA: Diagnosis present

## 2018-07-23 DIAGNOSIS — N390 Urinary tract infection, site not specified: Secondary | ICD-10-CM | POA: Diagnosis present

## 2018-07-23 DIAGNOSIS — N179 Acute kidney failure, unspecified: Secondary | ICD-10-CM | POA: Diagnosis not present

## 2018-07-23 DIAGNOSIS — Z96642 Presence of left artificial hip joint: Secondary | ICD-10-CM | POA: Diagnosis present

## 2018-07-23 DIAGNOSIS — Z818 Family history of other mental and behavioral disorders: Secondary | ICD-10-CM

## 2018-07-23 DIAGNOSIS — J9601 Acute respiratory failure with hypoxia: Secondary | ICD-10-CM | POA: Diagnosis not present

## 2018-07-23 DIAGNOSIS — G459 Transient cerebral ischemic attack, unspecified: Secondary | ICD-10-CM | POA: Diagnosis present

## 2018-07-23 DIAGNOSIS — S42302A Unspecified fracture of shaft of humerus, left arm, initial encounter for closed fracture: Secondary | ICD-10-CM

## 2018-07-23 DIAGNOSIS — S52032A Displaced fracture of olecranon process with intraarticular extension of left ulna, initial encounter for closed fracture: Principal | ICD-10-CM | POA: Diagnosis present

## 2018-07-23 DIAGNOSIS — Z87891 Personal history of nicotine dependence: Secondary | ICD-10-CM

## 2018-07-23 DIAGNOSIS — K5900 Constipation, unspecified: Secondary | ICD-10-CM | POA: Diagnosis not present

## 2018-07-23 DIAGNOSIS — M810 Age-related osteoporosis without current pathological fracture: Secondary | ICD-10-CM | POA: Diagnosis present

## 2018-07-23 DIAGNOSIS — S52022A Displaced fracture of olecranon process without intraarticular extension of left ulna, initial encounter for closed fracture: Secondary | ICD-10-CM

## 2018-07-23 DIAGNOSIS — I10 Essential (primary) hypertension: Secondary | ICD-10-CM | POA: Diagnosis not present

## 2018-07-23 DIAGNOSIS — Z886 Allergy status to analgesic agent status: Secondary | ICD-10-CM

## 2018-07-23 DIAGNOSIS — E876 Hypokalemia: Secondary | ICD-10-CM | POA: Diagnosis not present

## 2018-07-23 DIAGNOSIS — S46312A Strain of muscle, fascia and tendon of triceps, left arm, initial encounter: Secondary | ICD-10-CM

## 2018-07-23 DIAGNOSIS — Z808 Family history of malignant neoplasm of other organs or systems: Secondary | ICD-10-CM

## 2018-07-23 DIAGNOSIS — Z820 Family history of epilepsy and other diseases of the nervous system: Secondary | ICD-10-CM

## 2018-07-23 DIAGNOSIS — W182XXA Fall in (into) shower or empty bathtub, initial encounter: Secondary | ICD-10-CM | POA: Diagnosis not present

## 2018-07-23 DIAGNOSIS — R000 Tachycardia, unspecified: Secondary | ICD-10-CM | POA: Diagnosis not present

## 2018-07-23 DIAGNOSIS — Z885 Allergy status to narcotic agent status: Secondary | ICD-10-CM

## 2018-07-23 DIAGNOSIS — Y92121 Bathroom in nursing home as the place of occurrence of the external cause: Secondary | ICD-10-CM

## 2018-07-23 DIAGNOSIS — Z8673 Personal history of transient ischemic attack (TIA), and cerebral infarction without residual deficits: Secondary | ICD-10-CM

## 2018-07-23 DIAGNOSIS — R413 Other amnesia: Secondary | ICD-10-CM | POA: Diagnosis present

## 2018-07-23 DIAGNOSIS — E785 Hyperlipidemia, unspecified: Secondary | ICD-10-CM | POA: Diagnosis present

## 2018-07-23 DIAGNOSIS — Z9181 History of falling: Secondary | ICD-10-CM

## 2018-07-23 DIAGNOSIS — W19XXXA Unspecified fall, initial encounter: Secondary | ICD-10-CM | POA: Diagnosis present

## 2018-07-23 DIAGNOSIS — G2581 Restless legs syndrome: Secondary | ICD-10-CM | POA: Diagnosis present

## 2018-07-23 DIAGNOSIS — Z9119 Patient's noncompliance with other medical treatment and regimen: Secondary | ICD-10-CM

## 2018-07-23 DIAGNOSIS — F05 Delirium due to known physiological condition: Secondary | ICD-10-CM | POA: Diagnosis present

## 2018-07-23 DIAGNOSIS — Y93E1 Activity, personal bathing and showering: Secondary | ICD-10-CM

## 2018-07-23 DIAGNOSIS — R0902 Hypoxemia: Secondary | ICD-10-CM | POA: Diagnosis not present

## 2018-07-23 DIAGNOSIS — R2681 Unsteadiness on feet: Secondary | ICD-10-CM | POA: Diagnosis present

## 2018-07-23 DIAGNOSIS — Z8679 Personal history of other diseases of the circulatory system: Secondary | ICD-10-CM

## 2018-07-23 DIAGNOSIS — Z66 Do not resuscitate: Secondary | ICD-10-CM | POA: Diagnosis present

## 2018-07-23 DIAGNOSIS — F0390 Unspecified dementia without behavioral disturbance: Secondary | ICD-10-CM | POA: Diagnosis present

## 2018-07-23 DIAGNOSIS — S32592A Other specified fracture of left pubis, initial encounter for closed fracture: Secondary | ICD-10-CM

## 2018-07-23 DIAGNOSIS — E860 Dehydration: Secondary | ICD-10-CM | POA: Diagnosis present

## 2018-07-23 DIAGNOSIS — Z79899 Other long term (current) drug therapy: Secondary | ICD-10-CM

## 2018-07-23 DIAGNOSIS — R296 Repeated falls: Secondary | ICD-10-CM | POA: Diagnosis not present

## 2018-07-23 DIAGNOSIS — S32599A Other specified fracture of unspecified pubis, initial encounter for closed fracture: Secondary | ICD-10-CM | POA: Diagnosis present

## 2018-07-23 LAB — URINALYSIS, ROUTINE W REFLEX MICROSCOPIC
Bilirubin Urine: NEGATIVE
GLUCOSE, UA: NEGATIVE mg/dL
Hgb urine dipstick: NEGATIVE
Ketones, ur: 5 mg/dL — AB
Leukocytes, UA: NEGATIVE
Nitrite: POSITIVE — AB
Protein, ur: NEGATIVE mg/dL
Specific Gravity, Urine: 1.02 (ref 1.005–1.030)
pH: 5 (ref 5.0–8.0)

## 2018-07-23 LAB — CBC WITH DIFFERENTIAL/PLATELET
Abs Immature Granulocytes: 0.13 10*3/uL — ABNORMAL HIGH (ref 0.00–0.07)
Basophils Absolute: 0 10*3/uL (ref 0.0–0.1)
Basophils Relative: 0 %
Eosinophils Absolute: 0 10*3/uL (ref 0.0–0.5)
Eosinophils Relative: 0 %
HCT: 37.2 % (ref 36.0–46.0)
Hemoglobin: 12.3 g/dL (ref 12.0–15.0)
Immature Granulocytes: 1 %
Lymphocytes Relative: 4 %
Lymphs Abs: 0.5 10*3/uL — ABNORMAL LOW (ref 0.7–4.0)
MCH: 30.2 pg (ref 26.0–34.0)
MCHC: 33.1 g/dL (ref 30.0–36.0)
MCV: 91.4 fL (ref 80.0–100.0)
Monocytes Absolute: 0.7 10*3/uL (ref 0.1–1.0)
Monocytes Relative: 6 %
NRBC: 0 % (ref 0.0–0.2)
Neutro Abs: 11 10*3/uL — ABNORMAL HIGH (ref 1.7–7.7)
Neutrophils Relative %: 89 %
Platelets: 188 10*3/uL (ref 150–400)
RBC: 4.07 MIL/uL (ref 3.87–5.11)
RDW: 13 % (ref 11.5–15.5)
WBC: 12.3 10*3/uL — ABNORMAL HIGH (ref 4.0–10.5)

## 2018-07-23 LAB — COMPREHENSIVE METABOLIC PANEL
ALT: 24 U/L (ref 0–44)
AST: 39 U/L (ref 15–41)
Albumin: 4.2 g/dL (ref 3.5–5.0)
Alkaline Phosphatase: 51 U/L (ref 38–126)
Anion gap: 11 (ref 5–15)
BUN: 37 mg/dL — ABNORMAL HIGH (ref 8–23)
CHLORIDE: 103 mmol/L (ref 98–111)
CO2: 24 mmol/L (ref 22–32)
CREATININE: 1.75 mg/dL — AB (ref 0.44–1.00)
Calcium: 9 mg/dL (ref 8.9–10.3)
GFR, EST AFRICAN AMERICAN: 31 mL/min — AB (ref >60)
GFR, EST NON AFRICAN AMERICAN: 26 mL/min — AB (ref >60)
Glucose, Bld: 133 mg/dL — ABNORMAL HIGH (ref 70–99)
Potassium: 3.8 mmol/L (ref 3.5–5.1)
Sodium: 138 mmol/L (ref 135–145)
Total Bilirubin: 0.6 mg/dL (ref 0.3–1.2)
Total Protein: 7.1 g/dL (ref 6.5–8.1)

## 2018-07-23 LAB — INFLUENZA PANEL BY PCR (TYPE A & B)
Influenza A By PCR: NEGATIVE
Influenza B By PCR: NEGATIVE

## 2018-07-23 MED ORDER — ENOXAPARIN SODIUM 30 MG/0.3ML ~~LOC~~ SOLN
30.0000 mg | SUBCUTANEOUS | Status: AC
  Administered 2018-07-23 – 2018-07-24 (×2): 30 mg via SUBCUTANEOUS
  Filled 2018-07-23 (×3): qty 0.3

## 2018-07-23 MED ORDER — MORPHINE SULFATE (PF) 2 MG/ML IV SOLN
2.0000 mg | Freq: Once | INTRAVENOUS | Status: AC
  Administered 2018-07-23: 2 mg via INTRAVENOUS
  Filled 2018-07-23: qty 1

## 2018-07-23 MED ORDER — ACETAMINOPHEN 500 MG PO TABS
1000.0000 mg | ORAL_TABLET | Freq: Once | ORAL | Status: AC
  Administered 2018-07-23: 1000 mg via ORAL
  Filled 2018-07-23: qty 2

## 2018-07-23 MED ORDER — GABAPENTIN 100 MG PO CAPS
100.0000 mg | ORAL_CAPSULE | Freq: Three times a day (TID) | ORAL | Status: DC
  Administered 2018-07-23 – 2018-07-29 (×13): 100 mg via ORAL
  Filled 2018-07-23 (×15): qty 1

## 2018-07-23 MED ORDER — SODIUM CHLORIDE 0.9 % IV SOLN
INTRAVENOUS | Status: DC
  Administered 2018-07-23 – 2018-07-27 (×5): via INTRAVENOUS

## 2018-07-23 MED ORDER — TRAZODONE HCL 50 MG PO TABS
75.0000 mg | ORAL_TABLET | Freq: Every day | ORAL | Status: DC
  Administered 2018-07-23 – 2018-07-28 (×5): 75 mg via ORAL
  Filled 2018-07-23 (×7): qty 2

## 2018-07-23 MED ORDER — HALOPERIDOL LACTATE 5 MG/ML IJ SOLN
2.0000 mg | Freq: Once | INTRAMUSCULAR | Status: AC
  Administered 2018-07-23: 2 mg via INTRAVENOUS
  Filled 2018-07-23: qty 1

## 2018-07-23 MED ORDER — HYDROXYZINE HCL 25 MG PO TABS
25.0000 mg | ORAL_TABLET | Freq: Three times a day (TID) | ORAL | Status: DC | PRN
  Administered 2018-07-25 – 2018-07-29 (×6): 25 mg via ORAL
  Filled 2018-07-23 (×6): qty 1

## 2018-07-23 MED ORDER — SODIUM CHLORIDE 0.9 % IV BOLUS
1000.0000 mL | Freq: Once | INTRAVENOUS | Status: AC
  Administered 2018-07-25: 1000 mL via INTRAVENOUS

## 2018-07-23 MED ORDER — QUETIAPINE FUMARATE 50 MG PO TABS
50.0000 mg | ORAL_TABLET | Freq: Every day | ORAL | Status: DC
  Administered 2018-07-23 – 2018-07-28 (×5): 50 mg via ORAL
  Filled 2018-07-23 (×9): qty 1

## 2018-07-23 MED ORDER — SERTRALINE HCL 100 MG PO TABS
100.0000 mg | ORAL_TABLET | Freq: Every day | ORAL | Status: DC
  Administered 2018-07-24 – 2018-07-29 (×5): 100 mg via ORAL
  Filled 2018-07-23 (×5): qty 1

## 2018-07-23 MED ORDER — MORPHINE SULFATE (PF) 2 MG/ML IV SOLN
1.0000 mg | INTRAVENOUS | Status: DC | PRN
  Administered 2018-07-23: 2 mg via INTRAVENOUS
  Administered 2018-07-24 (×3): 1 mg via INTRAVENOUS
  Administered 2018-07-25: 2 mg via INTRAVENOUS
  Administered 2018-07-25 (×2): 1 mg via INTRAVENOUS
  Administered 2018-07-26 – 2018-07-29 (×8): 2 mg via INTRAVENOUS
  Filled 2018-07-23 (×16): qty 1

## 2018-07-23 NOTE — ED Triage Notes (Signed)
Per EMS: Pt has increased number of falls: 3x in 24 hours.pt seen yesterday for first fall.  Pt c/o of left arm pain from 3rd fall, no deformities or bruising noted.  Pain increased upon range of motion. Possible UTI- staff noticed pungent odor in urine. Pt hx of dementia, A&Ox1 at baseline.

## 2018-07-23 NOTE — ED Notes (Signed)
Bed: WA21 Expected date:  Expected time:  Means of arrival:  Comments: EMS fall  

## 2018-07-23 NOTE — ED Provider Notes (Signed)
Emergency Department Provider Note   I have reviewed the triage vital signs and the nursing notes.   HISTORY  Chief Complaint No chief complaint on file.   HPI Heather Floyd is a 85 y.o. female with PMH of HTN, HLD, and Dementia currently in a memory care unit presents to the emergency department for evaluation after 2 additional falls.  The patient had 2, unwitnessed falls last night and this morning.  Unknown head trauma.  Patient unable to provide significant history but is complaining of left arm pain.  She was seen in the emergency department yesterday after fall and had CT imaging of the head and cervical spine along with plain film of the hip.  The patient's daughter, at bedside, states that she is at her baseline mental status but that the frequent falls are not typical for her.  She is usually able to walk unassisted and wander throughout the unit which is allowed.  She questions if the patient may have a UTI as she has been told that her urine is foul-smelling.  She has not been told regarding any fevers or shaking chills.  They have not noticed coughing.  No vomiting or diarrhea.   Level 5 caveat: Dementia.   Past Medical History:  Diagnosis Date  . Abnormality of gait 09/08/2014  . Carotid artery occlusion   . Concussion with loss of consciousness 09/08/2014  . DJD (degenerative joint disease) of cervical spine   . Hyperlipidemia   . Hypertension   . Memory disturbance 03/29/2013  . Osteoporosis   . Personal history of colonic polyps   . RLS (restless legs syndrome)   . Sleep disorder   . TIA (transient ischemic attack)   . Varicose veins     Patient Active Problem List   Diagnosis Date Noted  . Fall 07/23/2018  . Left displaced femoral neck fracture (HCC) 10/18/2017  . Palliative care encounter   . Colitis 08/25/2017  . Abdominal pain 04/08/2017  . Leukocytosis 04/08/2017  . Dementia (HCC) 04/08/2017  . Dehydration 04/08/2017  . Abnormal urinalysis  04/08/2017  . Enteritis 04/07/2017  . Abnormality of gait 09/08/2014  . Concussion with loss of consciousness 09/08/2014  . Subdural hematoma (HCC) 08/11/2014  . Hypokalemia 08/11/2014  . Patellar fracture 08/11/2014  . SDH (subdural hematoma) (HCC) 08/11/2014  . Hyperlipidemia   . Hypertension   . RLS (restless legs syndrome)   . DJD (degenerative joint disease) of cervical spine   . TIA (transient ischemic attack)   . Carotid artery occlusion   . Essential hypertension   . Memory disturbance 03/29/2013  . Near syncope 01/13/2013  . Accelerated hypertension 01/13/2013  . Hyperkalemia 01/13/2013    Past Surgical History:  Procedure Laterality Date  . ANTERIOR AND POSTERIOR VAGINAL REPAIR W/ SACROSPINOUS LIGAMENT SUSPENSION    . CESAREAN SECTION    . ENDOVENOUS ABLATION SAPHENOUS VEIN W/ LASER    . HEMORRHOID SURGERY    . JOINT REPLACEMENT    . REDUCTION MAMMAPLASTY    . TOTAL HIP ARTHROPLASTY Left 10/19/2017   Procedure: ANTERIOR HEMI HIP;  Surgeon: Tarry KosXu, Naiping M, MD;  Location: [facility] OR;  Service: Orthopedics;  Laterality: Left;   Allergies Aspirin and Codeine  Family History  Problem Relation Age of Onset  . Mental illness Mother   . Alzheimer's disease Mother   . Cancer Father   . Bone cancer Father   . Dementia Sister     Social History Social History   Tobacco Use  .  Smoking status: Former Smoker    Last attempt to quit: 06/16/1980    Years since quitting: 38.1  . Smokeless tobacco: Never Used  Substance Use Topics  . Alcohol use: No  . Drug use: No    Review of Systems  Level 5 caveat: Dementia.   ____________________________________________   PHYSICAL EXAM:  VITAL SIGNS: ED Triage Vitals [07/23/18 1151]  Enc Vitals Group     BP 133/77     Pulse Rate (!) 102     Resp (!) 28     Temp 98 F (36.7 C)     Temp Source Oral     SpO2 90 %   Constitutional: Alert with mild agitation and baseline confusion, per family. Well appearing and in no acute  distress. Eyes: Conjunctivae are normal.  Head: Atraumatic. Nose: No congestion/rhinnorhea. Mouth/Throat: Mucous membranes are moist.  Neck: No stridor. No cervical spine tenderness to palpation. Cardiovascular: Tachycardia. Good peripheral circulation. Grossly normal heart sounds.   Respiratory: Normal respiratory effort.  No retractions. Lungs CTAB. Gastrointestinal: Soft and nontender. No distention.  Musculoskeletal: Normal passive ROM of the bilateral LEs. Diminished ROM of the left shoulder, elbow, and wrist. No deformity. The extremity is neurovascularly intact. Normal ROM of the right shoulder, elbow, and wrist.  Neurologic:  Normal speech and language. No gross focal neurologic deficits are appreciated.  Skin:  Skin is warm, dry and intact. No rash noted.   ____________________________________________   LABS (all labs ordered are listed, but only abnormal results are displayed)  Labs Reviewed  COMPREHENSIVE METABOLIC PANEL - Abnormal; Notable for the following components:      Result Value   Glucose, Bld 133 (*)    BUN 37 (*)    Creatinine, Ser 1.75 (*)    GFR calc non Af Amer 26 (*)    GFR calc Af Amer 31 (*)    All other components within normal limits  CBC WITH DIFFERENTIAL/PLATELET - Abnormal; Notable for the following components:   WBC 12.3 (*)    Neutro Abs 11.0 (*)    Lymphs Abs 0.5 (*)    Abs Immature Granulocytes 0.13 (*)    All other components within normal limits  URINE CULTURE  CULTURE, BLOOD (ROUTINE X 2)  CULTURE, BLOOD (ROUTINE X 2)  URINALYSIS, ROUTINE W REFLEX MICROSCOPIC  INFLUENZA PANEL BY PCR (TYPE A & B)   ____________________________________________  RADIOLOGY  Dg Chest 1 View  Result Date: 07/23/2018 CLINICAL DATA:  Shortness of breath, multiple recent falls EXAM: CHEST  1 VIEW COMPARISON:  10/21/2017 FINDINGS: Normal heart size and mediastinal contours. Atherosclerotic calcification aorta. Bronchitic changes with minimal bibasilar  atelectasis. Tiny LEFT pleural effusion, decreased. No acute infiltrate or pneumothorax. Bones demineralized. IMPRESSION: Bronchitic changes with minimal bibasilar atelectasis and improved LEFT pleural effusion. Electronically Signed   By: Ulyses SouthwardMark  Boles M.D.   On: 07/23/2018 13:35   Dg Elbow 2 Views Left  Result Date: 07/23/2018 CLINICAL DATA:  Severe LEFT arm pain, multiple recent falls EXAM: LEFT ELBOW - 2 VIEW COMPARISON:  None FINDINGS: Osseous demineralization. Radiocapitellar alignment normal. No true lateral view. Displaced intra-articular olecranon fracture with significant dorsal soft tissue swelling. IMPRESSION: Displaced intra-articular olecranon fracture with significant soft tissue swelling. Electronically Signed   By: Ulyses SouthwardMark  Boles M.D.   On: 07/23/2018 13:31   Dg Wrist Complete Left  Result Date: 07/23/2018 CLINICAL DATA:  Multiple recent falls, severe LEFT arm pain EXAM: LEFT WRIST - COMPLETE 3+ VIEW COMPARISON:  04/12/2018 FINDINGS: Osseous demineralization. Joint  space narrowing throughout carpus. No acute fracture, dislocation, or bone destruction. IMPRESSION: Osseous demineralization with advanced degenerative changes of the LEFT wrist. No acute abnormalities. Electronically Signed   By: Ulyses Southward M.D.   On: 07/23/2018 13:34   Ct Head Wo Contrast  Result Date: 07/23/2018 CLINICAL DATA:  LU, EDISON V on 07/23/2018 at  1:03 PM EXAM: CT HEAD WITHOUT CONTRAST CT CERVICAL SPINE WITHOUT CONTRAST TECHNIQUE: Multidetector CT imaging of the head and cervical spine was performed following the standard protocol without intravenous contrast. Multiplanar CT image reconstructions of the cervical spine were also generated. COMPARISON:  07/22/2018. FINDINGS: CT HEAD FINDINGS Brain: No evidence of acute infarction, hemorrhage, hydrocephalus, extra-axial collection or mass lesion/mass effect. Mild ventricular sulcal enlargement reflecting age-appropriate volume loss. Mild white matter hypoattenuation  consistent with chronic microvascular ischemic change. Vascular: No hyperdense vessel or unexpected calcification. Skull: Normal. Negative for fracture or focal lesion. Sinuses/Orbits: No acute finding. Other: None. CT CERVICAL SPINE FINDINGS Images degraded by motion. Alignment: Normal. Skull base and vertebrae: No acute fracture. No primary bone lesion or focal pathologic process. Soft tissues and spinal canal: No prevertebral fluid or swelling. No visible canal hematoma. No soft tissue masses.  No adenopathy. Disc levels: Mild loss of disc height at C4-C5. Moderate loss of disc height at C5-C6 and C6-C7. Facet degenerative changes noted greatest on the right at C3-C4 and C4-C5. No convincing disc herniation. Upper chest: No acute findings.  Clear lung apices. Other: None. IMPRESSION: HEAD CT 1. No acute intracranial abnormalities. 2. No skull fracture. CERVICAL CT 1. No fracture or acute finding. Electronically Signed   By: Amie Portland M.D.   On: 07/23/2018 13:10   Ct Head Wo Contrast  Result Date: 07/22/2018 CLINICAL DATA:  Unwitnessed fall this morning. History of Alzheimer's dementia. RIGHT hip pain and RIGHT knee pain. EXAM: CT HEAD WITHOUT CONTRAST CT CERVICAL SPINE WITHOUT CONTRAST TECHNIQUE: Multidetector CT imaging of the head and cervical spine was performed following the standard protocol without intravenous contrast. Multiplanar CT image reconstructions of the cervical spine were also generated. COMPARISON:  Head CT dated 03/16/2017. FINDINGS: CT HEAD FINDINGS Brain: Ventricles are stable in size and configuration. Mild chronic small vessel ischemic change noted in the deep periventricular white matter regions. No mass, hemorrhage, edema or other evidence of acute parenchymal abnormality. No extra-axial hemorrhage. Vascular: Chronic calcified atherosclerotic changes of the large vessels at the skull base. No unexpected hyperdense vessel. Skull: Normal. Negative for fracture or focal lesion.  Sinuses/Orbits: No acute finding. Other: Scalp edema overlying the RIGHT frontoparietal bone. No underlying fracture. CT CERVICAL SPINE FINDINGS Alignment: Dextroscoliosis of the cervicothoracic spine, possibly accentuated by patient positioning. No evidence of acute vertebral body subluxation. Skull base and vertebrae: No fracture line or displaced fracture fragment appreciated. Facet joints appear appropriately aligned. Soft tissues and spinal canal: No prevertebral fluid or swelling. No visible canal hematoma. Disc levels: Mild degenerative spondylosis at the C5-6 and C6-7 levels. No significant central canal stenosis at any level. Upper chest: No acute findings. Other: Bilateral carotid atherosclerosis. IMPRESSION: 1. Scalp edema overlying the RIGHT frontoparietal bone. No underlying fracture. 2. No acute intracranial abnormality. No intracranial mass, hemorrhage or edema. No skull fracture. 3. No fracture or acute subluxation within the cervical spine. 4. Carotid atherosclerosis. Electronically Signed   By: Bary Richard M.D.   On: 07/22/2018 16:15   Ct Cervical Spine Wo Contrast  Result Date: 07/23/2018 CLINICAL DATA:  LU, EDISON V on 07/23/2018 at  1:03 PM EXAM:  CT HEAD WITHOUT CONTRAST CT CERVICAL SPINE WITHOUT CONTRAST TECHNIQUE: Multidetector CT imaging of the head and cervical spine was performed following the standard protocol without intravenous contrast. Multiplanar CT image reconstructions of the cervical spine were also generated. COMPARISON:  07/22/2018. FINDINGS: CT HEAD FINDINGS Brain: No evidence of acute infarction, hemorrhage, hydrocephalus, extra-axial collection or mass lesion/mass effect. Mild ventricular sulcal enlargement reflecting age-appropriate volume loss. Mild white matter hypoattenuation consistent with chronic microvascular ischemic change. Vascular: No hyperdense vessel or unexpected calcification. Skull: Normal. Negative for fracture or focal lesion. Sinuses/Orbits: No acute  finding. Other: None. CT CERVICAL SPINE FINDINGS Images degraded by motion. Alignment: Normal. Skull base and vertebrae: No acute fracture. No primary bone lesion or focal pathologic process. Soft tissues and spinal canal: No prevertebral fluid or swelling. No visible canal hematoma. No soft tissue masses.  No adenopathy. Disc levels: Mild loss of disc height at C4-C5. Moderate loss of disc height at C5-C6 and C6-C7. Facet degenerative changes noted greatest on the right at C3-C4 and C4-C5. No convincing disc herniation. Upper chest: No acute findings.  Clear lung apices. Other: None. IMPRESSION: HEAD CT 1. No acute intracranial abnormalities. 2. No skull fracture. CERVICAL CT 1. No fracture or acute finding. Electronically Signed   By: Amie Portland M.D.   On: 07/23/2018 13:10   Ct Cervical Spine Wo Contrast  Result Date: 07/22/2018 CLINICAL DATA:  Unwitnessed fall this morning. History of Alzheimer's dementia. RIGHT hip pain and RIGHT knee pain. EXAM: CT HEAD WITHOUT CONTRAST CT CERVICAL SPINE WITHOUT CONTRAST TECHNIQUE: Multidetector CT imaging of the head and cervical spine was performed following the standard protocol without intravenous contrast. Multiplanar CT image reconstructions of the cervical spine were also generated. COMPARISON:  Head CT dated 03/16/2017. FINDINGS: CT HEAD FINDINGS Brain: Ventricles are stable in size and configuration. Mild chronic small vessel ischemic change noted in the deep periventricular white matter regions. No mass, hemorrhage, edema or other evidence of acute parenchymal abnormality. No extra-axial hemorrhage. Vascular: Chronic calcified atherosclerotic changes of the large vessels at the skull base. No unexpected hyperdense vessel. Skull: Normal. Negative for fracture or focal lesion. Sinuses/Orbits: No acute finding. Other: Scalp edema overlying the RIGHT frontoparietal bone. No underlying fracture. CT CERVICAL SPINE FINDINGS Alignment: Dextroscoliosis of the  cervicothoracic spine, possibly accentuated by patient positioning. No evidence of acute vertebral body subluxation. Skull base and vertebrae: No fracture line or displaced fracture fragment appreciated. Facet joints appear appropriately aligned. Soft tissues and spinal canal: No prevertebral fluid or swelling. No visible canal hematoma. Disc levels: Mild degenerative spondylosis at the C5-6 and C6-7 levels. No significant central canal stenosis at any level. Upper chest: No acute findings. Other: Bilateral carotid atherosclerosis. IMPRESSION: 1. Scalp edema overlying the RIGHT frontoparietal bone. No underlying fracture. 2. No acute intracranial abnormality. No intracranial mass, hemorrhage or edema. No skull fracture. 3. No fracture or acute subluxation within the cervical spine. 4. Carotid atherosclerosis. Electronically Signed   By: Bary Richard M.D.   On: 07/22/2018 16:15   Dg Shoulder Left  Result Date: 07/23/2018 CLINICAL DATA:  Severe LEFT arm pain, multiple recent falls EXAM: LEFT SHOULDER - 2+ VIEW COMPARISON:  12/29/2008 FINDINGS: Osseous demineralization. AC joint alignment normal. No acute fracture, dislocation, or bone destruction.  Fall Visualized ribs intact. IMPRESSION: No acute abnormalities. Electronically Signed   By: Ulyses Southward M.D.   On: 07/23/2018 13:29   Dg Hips Bilat With Pelvis Min 5 Views  Result Date: 07/23/2018 CLINICAL DATA:  Multiple recent falls,  BILATERAL hip pain EXAM: DG HIP (WITH OR WITHOUT PELVIS) 5+V BILAT COMPARISON:  07/22/2018 FINDINGS: Osseous demineralization. LEFT hip prosthesis. Minimal narrowing of RIGHT hip joint. SI joints preserved. Nondisplaced fracture of the LEFT inferior pubic ramus. No additional fracture, dislocation, or bone destruction. IMPRESSION: Fracture LEFT inferior pubic ramus. Electronically Signed   By: Ulyses Southward M.D.   On: 07/23/2018 13:37    ____________________________________________   PROCEDURES  Procedure(s) performed:    Procedures  None ____________________________________________   INITIAL IMPRESSION / ASSESSMENT AND PLAN / ED COURSE  Pertinent labs & imaging results that were available during my care of the patient were reviewed by me and considered in my medical decision making (see chart for details).  Patient returns to the emergency department with 2 additional falls overnight.  She is afebrile with some tachycardia and documented hypoxemia.  The patient's O2 sat is on the same arm as the blood pressure cuff.  Question error but will move sensor and continue to follow.  Given her frequent falls I do plan for additional imaging of the head and cervical spine along with plain films of the left arm and bilateral hips.  Plan to obtain baseline labs and cath UA.  Doubt developing sepsis or PE.   02:25 PM Spoke with Dr. Roda Shutters who reviewed the images. Requesting a posterior splint on the left arm and repeat elbow film for better lateral view, which was ordered. WBAT for the pubic ramus fx. Patient could f/u as outpatient on Tuesday if she is safe for discharge. Updated family. They are concerned with her frequent falling. Patient does have tachycardia and continued mild hypoxemia. May benefit from obs admit, PT/OT, and Ortho evaluation. Dr. Roda Shutters states that if admitted he would want her admitted to [facility] in case surgery on the elbow is required.   03:30 PM Discussed patient's case with Hospitalist to request admission. Patient and family (if present) updated with plan. Care transferred to Hospitalist service.  I reviewed all nursing notes, vitals, pertinent old records, EKGs, labs, imaging (as available).  Primary team will need to call [facility] to request inpatient consult. Paged Dr. Roda Shutters regarding plan to admit but did not get confirmatory message. Discussed this with the Hospitalist at admission.   ____________________________________________  FINAL CLINICAL IMPRESSION(S) / ED DIAGNOSES  Final  diagnoses:  Closed fracture of left upper extremity, initial encounter  Closed fracture of left inferior pubic ramus, initial encounter (HCC)  Fall, initial encounter  Hypoxemia  AKI (acute kidney injury) (HCC)    MEDICATIONS GIVEN DURING THIS VISIT:  Medications  acetaminophen (TYLENOL) tablet 1,000 mg (1,000 mg Oral Given 07/23/18 1445)  morphine 2 MG/ML injection 2 mg (2 mg Intravenous Given 07/23/18 1455)  haloperidol lactate (HALDOL) injection 2 mg (2 mg Intravenous Given 07/23/18 1535)    Note:  This document was prepared using Dragon voice recognition software and may include unintentional dictation errors.  Alona Bene, MD Emergency Medicine    , Arlyss Repress, MD 07/23/18 1600

## 2018-07-23 NOTE — ED Notes (Signed)
Pt's ring given to daughter

## 2018-07-23 NOTE — H&P (Addendum)
History and Physical    Heather PandaGail M Muehl XBM:[id number] DOB: 06/06/1933 DOA: 07/23/2018  PCP: [name], Charles Alan, MD   Patient [facility].   I have personally briefly reviewed patient's [facility]  Chief Complaint: brought in for repeated falls since yesterday.   HPI: Heather Floyd is a 85 y.o. female with medical history significant of dementia, hypertension, hyperlipidemia, RLS, TIA, was initially brought in yesterday after a fall yesterday afternoon, she underwent CT head, and cervical spine and was discharged back to the memory care unit. She was brought in for another fall last night and this morning and complained about pain in the left elbow and unable to walk. They were unwitnessed and patient is unable to provide any significant history due to dementia, and left arm pain. As per the daughter at bedside, pt's mental status is at baseline. Her care giver from 2 days ago reports that patient had urinary frequency and foul smelling urine. No history of fevers or dysuria. No other history is available as pt has dementia.   ED Course: On arrival to ED, patient was afebrile, slightly tachycardic, tachypnea, oxygen sats at 87% on RA.  CXR showed Bronchitic changes with minimal bibasilar atelectasis and improved LEFT pleural effusion. CT head and cervical spine without contrast showed No acute intracranial abnormalities.  No skull fracture.  X rays of the hips show Fracture LEFT inferior pubic ramus. X rays of the Elbow Displaced intra-articular olecranon fracture with significant soft tissue swelling.   DR Roda ShuttersXu with [facility] consulted, who recommended left arm splint and [facility], so they can see the patient tomorrow and determine if she needs surgery.   Lab work is significant for mild leukocytosis, creatinine of 1.5 , BUN OF 37.   UA show positive nitrites, with many bacteria.  Urine cultures sent.  Influenza PCR  is negative.   She was referred to medical service for admission for falls, left elbow fracture and possible UTI.    Review of Systems:  Pt confused , detailed ROS couldn't be obtained.   Past Medical History:  Diagnosis Date  . Abnormality of gait 09/08/2014  . Carotid artery occlusion   . Concussion with loss of consciousness 09/08/2014  . DJD (degenerative joint disease) of cervical spine   . Hyperlipidemia   . Hypertension   . Memory disturbance 03/29/2013  . Osteoporosis   . Personal history of colonic polyps   . RLS (restless legs syndrome)   . Sleep disorder   . TIA (transient ischemic attack)   . Varicose veins     Past Surgical History:  Procedure Laterality Date  . ANTERIOR AND POSTERIOR VAGINAL REPAIR W/ SACROSPINOUS LIGAMENT SUSPENSION    . CESAREAN SECTION    . ENDOVENOUS ABLATION SAPHENOUS VEIN W/ LASER    . HEMORRHOID SURGERY    . JOINT REPLACEMENT    . REDUCTION MAMMAPLASTY    . TOTAL HIP ARTHROPLASTY Left 10/19/2017   Procedure: ANTERIOR HEMI HIP;  Surgeon: Tarry KosXu, Naiping M, MD;  Location: [facility] OR;  Service: Orthopedics;  Laterality: Left;     reports that she quit smoking about 38 years ago. She has never used smokeless tobacco. She reports that she does not drink alcohol or use drugs.  Allergies  Allergen Reactions  . Aspirin Other (See Comments)    Makes blood too thin  . Codeine Nausea Only    Family History  Problem Relation Age of Onset  .  Mental illness Mother   . Alzheimer's disease Mother   . Cancer Father   . Bone cancer Father   . Dementia Sister     Family history reviewed and not pertinent  Prior to Admission medications   Medication Sig Start Date End Date Taking? Authorizing Provider  acetaminophen (TYLENOL) 160 MG/5ML solution Take 15.6 mLs (500 mg total) by mouth every 6 (six) hours as needed for mild pain. 07/22/18  Yes Robinson, [place] N, PA-C  amLODipine (NORVASC) 5 MG tablet Take 5 mg by mouth daily. 02/01/18  Yes [provider]  gabapentin (NEURONTIN) 100 MG capsule Take 100 mg by mouth 3 (three) times daily. 03/30/18  Yes [provider]  hydrOXYzine (ATARAX/VISTARIL) 25 MG tablet Take 1 tablet (25 mg total) by mouth 3 (three) times daily as needed. 07/02/18  Yes Butch Penny, NP  lisinopril (PRINIVIL,ZESTRIL) 40 MG tablet Take 40 mg by mouth daily.   Yes [provider]  QUEtiapine (SEROQUEL) 50 MG tablet Take 1 tablet (50 mg total) by mouth at bedtime. 04/07/18  Yes Butch Penny, NP  sertraline (ZOLOFT) 100 MG tablet Take 1 tablet (100 mg total) by mouth daily. 09/21/17  Yes Butch Penny, NP  traZODone (DESYREL) 50 MG tablet Take 1.5 tablets (75 mg total) by mouth at bedtime. 07/02/18  Yes Butch Penny, NP    Physical Exam: Vitals:   07/23/18 1151 07/23/18 1155  BP: 133/77   Pulse: (!) 102   Resp: (!) 28   Temp: 98 F (36.7 C)   TempSrc: Oral   SpO2: 90% (!) 87%    Constitutional: confused, in mod distress from pain,  Vitals:   07/23/18 1151 07/23/18 1155  BP: 133/77   Pulse: (!) 102   Resp: (!) 28   Temp: 98 F (36.7 C)   TempSrc: Oral   SpO2: 90% (!) 87%   Eyes: PERRL, lids and conjunctivae normal ENMT: Mucous membranes are dry  Neck: normal, supple, no masses, no thyromegaly Respiratory: clear to auscultation bilaterally,  Normal respiratory effort.   Cardiovascular: Regular rate and rhythm, no murmurs   Abdomen:  Soft, non distended, no tenderness,. Bowel sounds positive.  Musculoskeletal: no clubbing / cyanosis. Painful ROM of the left upper extremity.   Skin: no rashes, lesions, ulcers. No induration Neurologic: alert , able to  Move all extremities, to some extent.  Psychiatric: pt confused and delirious.    Labs on Admission: I have personally reviewed following labs and imaging studies  CBC: Recent Labs  Lab 07/23/18 1205  WBC 12.3*  NEUTROABS 11.0*  HGB 12.3  HCT 37.2  MCV 91.4  PLT 188   Basic Metabolic Panel: Recent Labs  Lab  07/23/18 1205  NA 138  K 3.8  CL 103  CO2 24  GLUCOSE 133*  BUN 37*  CREATININE 1.75*  CALCIUM 9.0   GFR: Estimated Creatinine Clearance: 19.3 mL/min (A) (by C-G formula based on SCr of 1.75 mg/dL (H)). Liver Function Tests: Recent Labs  Lab 07/23/18 1205  AST 39  ALT 24  ALKPHOS 51  BILITOT 0.6  PROT 7.1  ALBUMIN 4.2   No results for input(s): LIPASE, AMYLASE in the last 168 hours. No results for input(s): AMMONIA in the last 168 hours. Coagulation Profile: No results for input(s): INR, PROTIME in the last 168 hours. Cardiac Enzymes: No results for input(s): CKTOTAL, CKMB, CKMBINDEX, TROPONINI in the last 168 hours. BNP (last 3 results) No results for input(s): PROBNP in the last 8760 hours. HbA1C:  No results for input(s): HGBA1C in the last 72 hours. CBG: No results for input(s): GLUCAP in the last 168 hours. Lipid Profile: No results for input(s): CHOL, HDL, LDLCALC, TRIG, CHOLHDL, LDLDIRECT in the last 72 hours. Thyroid Function Tests: No results for input(s): TSH, T4TOTAL, FREET4, T3FREE, THYROIDAB in the last 72 hours. Anemia Panel: No results for input(s): VITAMINB12, FOLATE, FERRITIN, TIBC, IRON, RETICCTPCT in the last 72 hours. Urine analysis:    Component Value Date/Time   COLORURINE YELLOW 07/23/2018 1640   APPEARANCEUR HAZY (A) 07/23/2018 1640   LABSPEC 1.020 07/23/2018 1640   PHURINE 5.0 07/23/2018 1640   GLUCOSEU NEGATIVE 07/23/2018 1640   HGBUR NEGATIVE 07/23/2018 1640   BILIRUBINUR NEGATIVE 07/23/2018 1640   KETONESUR 5 (A) 07/23/2018 1640   PROTEINUR NEGATIVE 07/23/2018 1640   UROBILINOGEN 1.0 09/30/2014 1211   NITRITE POSITIVE (A) 07/23/2018 1640   LEUKOCYTESUR NEGATIVE 07/23/2018 1640    Radiological Exams on Admission: Dg Chest 1 View  Result Date: 07/23/2018 CLINICAL DATA:  Shortness of breath, multiple recent falls EXAM: CHEST  1 VIEW COMPARISON:  10/21/2017 FINDINGS: Normal heart size and mediastinal contours. Atherosclerotic  calcification aorta. Bronchitic changes with minimal bibasilar atelectasis. Tiny LEFT pleural effusion, decreased. No acute infiltrate or pneumothorax. Bones demineralized. IMPRESSION: Bronchitic changes with minimal bibasilar atelectasis and improved LEFT pleural effusion. Electronically Signed   By: Ulyses Southward M.D.   On: 07/23/2018 13:35   Dg Elbow 2 Views Left  Result Date: 07/23/2018 CLINICAL DATA:  Proximal ulna fracture. EXAM: LEFT ELBOW - 2 VIEW 4:43 p.m. COMPARISON:  Radiographs dated 07/23/2018 at 1:05 p.m. FINDINGS: The patient is been placed in a splint since the prior study. There is a distracted fracture of the olecranon process of the proximal ulna. Distraction is approximately 12 mm. There is also slight rotation of the proximal fragment. There is also an impaction fracture of the articular surface of the proximal ulna seen on the lateral view. Hemarthrosis.  Distal humerus and proximal radius are intact. IMPRESSION: Distracted slightly rotated fracture of the olecranon process of the proximal ulna. Impaction fracture of the articular surface of ulna anterior to the olecranon process. Electronically Signed   By: Francene Boyers M.D.   On: 07/23/2018 16:57   Dg Elbow 2 Views Left  Result Date: 07/23/2018 CLINICAL DATA:  Severe LEFT arm pain, multiple recent falls EXAM: LEFT ELBOW - 2 VIEW COMPARISON:  None FINDINGS: Osseous demineralization. Radiocapitellar alignment normal. No true lateral view. Displaced intra-articular olecranon fracture with significant dorsal soft tissue swelling. IMPRESSION: Displaced intra-articular olecranon fracture with significant soft tissue swelling. Electronically Signed   By: Ulyses Southward M.D.   On: 07/23/2018 13:31   Dg Wrist Complete Left  Result Date: 07/23/2018 CLINICAL DATA:  Multiple recent falls, severe LEFT arm pain EXAM: LEFT WRIST - COMPLETE 3+ VIEW COMPARISON:  04/12/2018 FINDINGS: Osseous demineralization. Joint space narrowing throughout carpus. No  acute fracture, dislocation, or bone destruction. IMPRESSION: Osseous demineralization with advanced degenerative changes of the LEFT wrist. No acute abnormalities. Electronically Signed   By: Ulyses Southward M.D.   On: 07/23/2018 13:34   Dg Knee 2 Views Right  Result Date: 07/22/2018 CLINICAL DATA:  Pt to ED with c/o of an unwitnessed fall. Pt is from [facility] facility and was in the shower this morning, pt fell to the bottom of the shower. Staff was not present but heard it, and found her sitting up in the tub. Pt has hx of alz dementia. When [facility] arrived patient  was walking around but complaining of right hip and right knee pain 10/10. No deformities present. EXAM: RIGHT KNEE - 1-2 VIEW COMPARISON:  None. FINDINGS: No evidence of fracture, dislocation, or joint effusion. No evidence of arthropathy or other focal bone abnormality. Soft tissues are unremarkable. IMPRESSION: Negative. Electronically Signed   By: Amie Portlandavid  Ormond M.D.   On: 07/22/2018 15:17   Ct Head Wo Contrast  Result Date: 07/23/2018 CLINICAL DATA:  LU, EDISON V on 07/23/2018 at  1:03 PM EXAM: CT HEAD WITHOUT CONTRAST CT CERVICAL SPINE WITHOUT CONTRAST TECHNIQUE: Multidetector CT imaging of the head and cervical spine was performed following the standard protocol without intravenous contrast. Multiplanar CT image reconstructions of the cervical spine were also generated. COMPARISON:  07/22/2018. FINDINGS: CT HEAD FINDINGS Brain: No evidence of acute infarction, hemorrhage, hydrocephalus, extra-axial collection or mass lesion/mass effect. Mild ventricular sulcal enlargement reflecting age-appropriate volume loss. Mild white matter hypoattenuation consistent with chronic microvascular ischemic change. Vascular: No hyperdense vessel or unexpected calcification. Skull: Normal. Negative for fracture or focal lesion. Sinuses/Orbits: No acute finding. Other: None. CT CERVICAL SPINE FINDINGS Images degraded by motion. Alignment: Normal. Skull base and  vertebrae: No acute fracture. No primary bone lesion or focal pathologic process. Soft tissues and spinal canal: No prevertebral fluid or swelling. No visible canal hematoma. No soft tissue masses.  No adenopathy. Disc levels: Mild loss of disc height at C4-C5. Moderate loss of disc height at C5-C6 and C6-C7. Facet degenerative changes noted greatest on the right at C3-C4 and C4-C5. No convincing disc herniation. Upper chest: No acute findings.  Clear lung apices. Other: None. IMPRESSION: HEAD CT 1. No acute intracranial abnormalities. 2. No skull fracture. CERVICAL CT 1. No fracture or acute finding. Electronically Signed   By: Amie Portlandavid  Ormond M.D.   On: 07/23/2018 13:10   Ct Head Wo Contrast  Result Date: 07/22/2018 CLINICAL DATA:  Unwitnessed fall this morning. History of Alzheimer's dementia. RIGHT hip pain and RIGHT knee pain. EXAM: CT HEAD WITHOUT CONTRAST CT CERVICAL SPINE WITHOUT CONTRAST TECHNIQUE: Multidetector CT imaging of the head and cervical spine was performed following the standard protocol without intravenous contrast. Multiplanar CT image reconstructions of the cervical spine were also generated. COMPARISON:  Head CT dated 03/16/2017. FINDINGS: CT HEAD FINDINGS Brain: Ventricles are stable in size and configuration. Mild chronic small vessel ischemic change noted in the deep periventricular white matter regions. No mass, hemorrhage, edema or other evidence of acute parenchymal abnormality. No extra-axial hemorrhage. Vascular: Chronic calcified atherosclerotic changes of the large vessels at the skull base. No unexpected hyperdense vessel. Skull: Normal. Negative for fracture or focal lesion. Sinuses/Orbits: No acute finding. Other: Scalp edema overlying the RIGHT frontoparietal bone. No underlying fracture. CT CERVICAL SPINE FINDINGS Alignment: Dextroscoliosis of the cervicothoracic spine, possibly accentuated by patient positioning. No evidence of acute vertebral body subluxation. Skull base and  vertebrae: No fracture line or displaced fracture fragment appreciated. Facet joints appear appropriately aligned. Soft tissues and spinal canal: No prevertebral fluid or swelling. No visible canal hematoma. Disc levels: Mild degenerative spondylosis at the C5-6 and C6-7 levels. No significant central canal stenosis at any level. Upper chest: No acute findings. Other: Bilateral carotid atherosclerosis. IMPRESSION: 1. Scalp edema overlying the RIGHT frontoparietal bone. No underlying fracture. 2. No acute intracranial abnormality. No intracranial mass, hemorrhage or edema. No skull fracture. 3. No fracture or acute subluxation within the cervical spine. 4. Carotid atherosclerosis. Electronically Signed   By: Bary RichardStan  Maynard M.D.   On: 07/22/2018 16:15  Ct Cervical Spine Wo Contrast  Result Date: 07/23/2018 CLINICAL DATA:  LU, EDISON V on 07/23/2018 at  1:03 PM EXAM: CT HEAD WITHOUT CONTRAST CT CERVICAL SPINE WITHOUT CONTRAST TECHNIQUE: Multidetector CT imaging of the head and cervical spine was performed following the standard protocol without intravenous contrast. Multiplanar CT image reconstructions of the cervical spine were also generated. COMPARISON:  07/22/2018. FINDINGS: CT HEAD FINDINGS Brain: No evidence of acute infarction, hemorrhage, hydrocephalus, extra-axial collection or mass lesion/mass effect. Mild ventricular sulcal enlargement reflecting age-appropriate volume loss. Mild white matter hypoattenuation consistent with chronic microvascular ischemic change. Vascular: No hyperdense vessel or unexpected calcification. Skull: Normal. Negative for fracture or focal lesion. Sinuses/Orbits: No acute finding. Other: None. CT CERVICAL SPINE FINDINGS Images degraded by motion. Alignment: Normal. Skull base and vertebrae: No acute fracture. No primary bone lesion or focal pathologic process. Soft tissues and spinal canal: No prevertebral fluid or swelling. No visible canal hematoma. No soft tissue masses.  No  adenopathy. Disc levels: Mild loss of disc height at C4-C5. Moderate loss of disc height at C5-C6 and C6-C7. Facet degenerative changes noted greatest on the right at C3-C4 and C4-C5. No convincing disc herniation. Upper chest: No acute findings.  Clear lung apices. Other: None. IMPRESSION: HEAD CT 1. No acute intracranial abnormalities. 2. No skull fracture. CERVICAL CT 1. No fracture or acute finding. Electronically Signed   By: Amie Portland M.D.   On: 07/23/2018 13:10   Ct Cervical Spine Wo Contrast  Result Date: 07/22/2018 CLINICAL DATA:  Unwitnessed fall this morning. History of Alzheimer's dementia. RIGHT hip pain and RIGHT knee pain. EXAM: CT HEAD WITHOUT CONTRAST CT CERVICAL SPINE WITHOUT CONTRAST TECHNIQUE: Multidetector CT imaging of the head and cervical spine was performed following the standard protocol without intravenous contrast. Multiplanar CT image reconstructions of the cervical spine were also generated. COMPARISON:  Head CT dated 03/16/2017. FINDINGS: CT HEAD FINDINGS Brain: Ventricles are stable in size and configuration. Mild chronic small vessel ischemic change noted in the deep periventricular white matter regions. No mass, hemorrhage, edema or other evidence of acute parenchymal abnormality. No extra-axial hemorrhage. Vascular: Chronic calcified atherosclerotic changes of the large vessels at the skull base. No unexpected hyperdense vessel. Skull: Normal. Negative for fracture or focal lesion. Sinuses/Orbits: No acute finding. Other: Scalp edema overlying the RIGHT frontoparietal bone. No underlying fracture. CT CERVICAL SPINE FINDINGS Alignment: Dextroscoliosis of the cervicothoracic spine, possibly accentuated by patient positioning. No evidence of acute vertebral body subluxation. Skull base and vertebrae: No fracture line or displaced fracture fragment appreciated. Facet joints appear appropriately aligned. Soft tissues and spinal canal: No prevertebral fluid or swelling. No visible  canal hematoma. Disc levels: Mild degenerative spondylosis at the C5-6 and C6-7 levels. No significant central canal stenosis at any level. Upper chest: No acute findings. Other: Bilateral carotid atherosclerosis. IMPRESSION: 1. Scalp edema overlying the RIGHT frontoparietal bone. No underlying fracture. 2. No acute intracranial abnormality. No intracranial mass, hemorrhage or edema. No skull fracture. 3. No fracture or acute subluxation within the cervical spine. 4. Carotid atherosclerosis. Electronically Signed   By: Bary Richard M.D.   On: 07/22/2018 16:15   Dg Shoulder Left  Result Date: 07/23/2018 CLINICAL DATA:  Severe LEFT arm pain, multiple recent falls EXAM: LEFT SHOULDER - 2+ VIEW COMPARISON:  12/29/2008 FINDINGS: Osseous demineralization. AC joint alignment normal. No acute fracture, dislocation, or bone destruction.  Fall Visualized ribs intact. IMPRESSION: No acute abnormalities. Electronically Signed   By: Ulyses Southward M.D.   On:  07/23/2018 13:29   Dg Hip Unilat  With Pelvis 2-3 Views Right  Result Date: 07/22/2018 CLINICAL DATA:  Pt to ED with c/o of an unwitnessed fall. Pt is from [facility] facility and was in the shower this morning, pt fell to the bottom of the shower. Staff was not present but heard it, and found her sitting up in the tub. Pt has hx of alz dementia. When [facility] arrived patient was walking around but complaining of right hip and right knee pain 10/10. No deformities present. EXAM: DG HIP (WITH OR WITHOUT PELVIS) 2-3V RIGHT COMPARISON:  None. FINDINGS: No fracture.  No bone lesion. Left hip prosthesis is well seated and aligned. Right hip joint, SI joints and symphysis pubis are normally spaced and aligned. Bones are demineralized. Soft tissues are unremarkable. IMPRESSION: No fracture, dislocation or acute finding. Electronically Signed   By: Amie Portland M.D.   On: 07/22/2018 15:17   Dg Hips Bilat With Pelvis Min 5 Views  Result Date: 07/23/2018 CLINICAL DATA:   Multiple recent falls, BILATERAL hip pain EXAM: DG HIP (WITH OR WITHOUT PELVIS) 5+V BILAT COMPARISON:  07/22/2018 FINDINGS: Osseous demineralization. LEFT hip prosthesis. Minimal narrowing of RIGHT hip joint. SI joints preserved. Nondisplaced fracture of the LEFT inferior pubic ramus. No additional fracture, dislocation, or bone destruction. IMPRESSION: Fracture LEFT inferior pubic ramus. Electronically Signed   By: Ulyses Southward M.D.   On: 07/23/2018 13:37    EKG: not done on admission.   Assessment/Plan Active Problems:   Memory disturbance   Hyperlipidemia   Hypertension   RLS (restless legs syndrome)   TIA (transient ischemic attack)   Dementia (HCC)   Dehydration   Fall   Acute lower UTI   Multiple falls over the last 24 hours with resultant left elbow fracture and left inferior pubic ramus fracture:  Splint applied by EDP over the left upper extremity,  Orthopedics consulted and plan to see the patient tomorrow and request transfer the patient to [facility].  Pain control and further recommendations as per orthopedics.    AKI;  Probably from dehydration.  Gently hydrate and repeat renal parameters in am.  If not improving get US renal to rule out obstruction.    Positive nitrites, bacteria in the urin,  increased urinary frequency, foul smelling urine and multiple falls Would treat this as UTI,  Get urine cultures, blood cultures and start her on rocephin.    Hypertension  Well controlled.    Hypoxia on arrival, but she denies any cough, fever or sob.  On 3 lit of [place] oxygen and CXR shows bronchitis changes.  Influenza PCR is negative.  Monitor.     Dementia:  Stable. No agitation seen.      DVT prophylaxis: Lovenox.  Code Status: DNR Family Communication: daughter at bedside.  Disposition Plan: pending orthopedics eva, physical therapy.  Consults called:  Dr Roda Shutters with [facility].  Admission status: obs/ tele.  Kathlen Mody MD [facility] Pager  [phone]  If 7PM-7AM, please contact night-coverage [email or web address] Password [email or web address]  07/23/2018, 5:26 PM

## 2018-07-23 NOTE — Progress Notes (Signed)
Pt had a run of SVT HR got up to the 150's MD notified Pt not symptomatic will continue to monitor

## 2018-07-24 DIAGNOSIS — Z66 Do not resuscitate: Secondary | ICD-10-CM | POA: Diagnosis present

## 2018-07-24 DIAGNOSIS — F0390 Unspecified dementia without behavioral disturbance: Secondary | ICD-10-CM | POA: Diagnosis present

## 2018-07-24 DIAGNOSIS — W182XXA Fall in (into) shower or empty bathtub, initial encounter: Secondary | ICD-10-CM | POA: Diagnosis present

## 2018-07-24 DIAGNOSIS — R000 Tachycardia, unspecified: Secondary | ICD-10-CM | POA: Diagnosis present

## 2018-07-24 DIAGNOSIS — E860 Dehydration: Secondary | ICD-10-CM | POA: Diagnosis present

## 2018-07-24 DIAGNOSIS — N179 Acute kidney failure, unspecified: Secondary | ICD-10-CM | POA: Diagnosis present

## 2018-07-24 DIAGNOSIS — I10 Essential (primary) hypertension: Secondary | ICD-10-CM | POA: Diagnosis present

## 2018-07-24 DIAGNOSIS — S52022A Displaced fracture of olecranon process without intraarticular extension of left ulna, initial encounter for closed fracture: Secondary | ICD-10-CM

## 2018-07-24 DIAGNOSIS — R2681 Unsteadiness on feet: Secondary | ICD-10-CM | POA: Diagnosis present

## 2018-07-24 DIAGNOSIS — S52032A Displaced fracture of olecranon process with intraarticular extension of left ulna, initial encounter for closed fracture: Secondary | ICD-10-CM | POA: Diagnosis present

## 2018-07-24 DIAGNOSIS — S32599A Other specified fracture of unspecified pubis, initial encounter for closed fracture: Secondary | ICD-10-CM | POA: Diagnosis present

## 2018-07-24 DIAGNOSIS — Y92121 Bathroom in nursing home as the place of occurrence of the external cause: Secondary | ICD-10-CM | POA: Diagnosis not present

## 2018-07-24 DIAGNOSIS — R296 Repeated falls: Secondary | ICD-10-CM | POA: Diagnosis present

## 2018-07-24 DIAGNOSIS — G2581 Restless legs syndrome: Secondary | ICD-10-CM | POA: Diagnosis present

## 2018-07-24 DIAGNOSIS — E876 Hypokalemia: Secondary | ICD-10-CM | POA: Diagnosis not present

## 2018-07-24 DIAGNOSIS — K5900 Constipation, unspecified: Secondary | ICD-10-CM | POA: Diagnosis not present

## 2018-07-24 DIAGNOSIS — Z8673 Personal history of transient ischemic attack (TIA), and cerebral infarction without residual deficits: Secondary | ICD-10-CM | POA: Diagnosis not present

## 2018-07-24 DIAGNOSIS — Z9119 Patient's noncompliance with other medical treatment and regimen: Secondary | ICD-10-CM | POA: Diagnosis not present

## 2018-07-24 DIAGNOSIS — Y93E1 Activity, personal bathing and showering: Secondary | ICD-10-CM | POA: Diagnosis not present

## 2018-07-24 DIAGNOSIS — M810 Age-related osteoporosis without current pathological fracture: Secondary | ICD-10-CM | POA: Diagnosis present

## 2018-07-24 DIAGNOSIS — S32592A Other specified fracture of left pubis, initial encounter for closed fracture: Secondary | ICD-10-CM | POA: Diagnosis present

## 2018-07-24 DIAGNOSIS — N390 Urinary tract infection, site not specified: Secondary | ICD-10-CM | POA: Diagnosis present

## 2018-07-24 DIAGNOSIS — Z9181 History of falling: Secondary | ICD-10-CM | POA: Diagnosis not present

## 2018-07-24 DIAGNOSIS — F05 Delirium due to known physiological condition: Secondary | ICD-10-CM | POA: Diagnosis present

## 2018-07-24 DIAGNOSIS — R0902 Hypoxemia: Secondary | ICD-10-CM | POA: Diagnosis present

## 2018-07-24 DIAGNOSIS — Z96642 Presence of left artificial hip joint: Secondary | ICD-10-CM | POA: Diagnosis present

## 2018-07-24 DIAGNOSIS — S46312A Strain of muscle, fascia and tendon of triceps, left arm, initial encounter: Secondary | ICD-10-CM | POA: Diagnosis present

## 2018-07-24 DIAGNOSIS — J9601 Acute respiratory failure with hypoxia: Secondary | ICD-10-CM | POA: Diagnosis present

## 2018-07-24 DIAGNOSIS — B9620 Unspecified Escherichia coli [E. coli] as the cause of diseases classified elsewhere: Secondary | ICD-10-CM | POA: Diagnosis present

## 2018-07-24 MED ORDER — SODIUM CHLORIDE 0.9 % IV SOLN
1.0000 g | INTRAVENOUS | Status: DC
  Administered 2018-07-24 – 2018-07-27 (×3): 1 g via INTRAVENOUS
  Filled 2018-07-24 (×5): qty 10

## 2018-07-24 MED ORDER — ENSURE ENLIVE PO LIQD
237.0000 mL | Freq: Three times a day (TID) | ORAL | Status: DC
  Administered 2018-07-25 – 2018-07-29 (×10): 237 mL via ORAL

## 2018-07-24 NOTE — Evaluation (Signed)
Physical Therapy Evaluation Patient Details Name: Heather Floyd MRN: [id number] DOB: [birth year] Today's Date: 07/24/2018   History of Present Illness  85 y.o. female admitted on 07/23/18 due to repeated falls and inability to walk, possible UTI.  Pt dx with L inferior pubic rami fx and L elbow displaced intra articular olecranon fx (plan for ORIF on Monday 07/26/18) with Dr. Roda Shutters.  Pt with significant PMH of TIA, RLS, memory disturbance, HTN, concussion, carotid artery occlusion, and L THA (anterior hemi hip).  Clinical Impression  Limited bed level assessment completed.  I am familiar with this pt from a previous admission.  Extra time needed due to dementia and no understanding of her current situation.  She tolerated LE exercises and elevating the HOB higher than she has previously, but significant extra time taken due to pain and dementia.  I educated daughter that our next progression will be to attempt to sit EOB (hopefully before she goes to surgery on Monday AM).  She will need SNF placement for rehab at discharge.   PT to follow acutely for deficits listed below.    Follow Up Recommendations SNF    Equipment Recommendations  Hospital bed;Wheelchair (measurements PT);Wheelchair cushion (measurements PT)    Recommendations for Other Services   NA    Precautions / Restrictions Precautions Precautions: Fall Required Braces or Orthoses: Sling(can have off in the bed, more for when up) Restrictions Weight Bearing Restrictions: Yes LUE Weight Bearing: Non weight bearing(this may change post op) LLE Weight Bearing: Weight bearing as tolerated      Mobility  Bed Mobility               General bed mobility comments: Positioned bed in chair like position with knees completely flat and we were able to get up higher than 45 degrees (per daughter this is the highest she has been since she has been here.  Did not attempt EOB due to pt's reports of pain in partial sitting in the bed, but  informed daughter that would be our next steps (attempting EOB.                 Pertinent Vitals/Pain Pain Assessment: Faces Faces Pain Scale: Hurts even more Pain Location: reports back, but seems to be left hip/arm Pain Descriptors / Indicators: Grimacing;Guarding Pain Intervention(s): Limited activity within patient's tolerance;Monitored during session;Repositioned    Home Living Family/patient expects to be discharged to:: Skilled nursing facility                 Additional Comments: Pt is from memory care ALF, but daughter is aware she will likely need SNF placement.     Prior Function Level of Independence: Needs assistance   Gait / Transfers Assistance Needed: Ambulates without assistive devices and has been non ambulatory since her multiple falls PTA (so less than a week).   ADL's / Homemaking Assistance Needed: assist from memory care staff        Hand Dominance   Dominant Hand: Right    Extremity/Trunk Assessment   Upper Extremity Assessment Upper Extremity Assessment: Defer to OT evaluation    Lower Extremity Assessment Lower Extremity Assessment: LLE deficits/detail LLE Deficits / Details: left leg is limited by pain with ROM, but she is actively moving her ankle at least 3/5, knee 3-/5, hip 2/5 grossly per bed level assessment. Limited hip and knee AAROM to ~90/90 painful when higher    Cervical / Trunk Assessment Cervical / Trunk Assessment: Other exceptions;Kyphotic Cervical /  Trunk Exceptions: Pt also tipping her head to the left for some reason, daughter reports this is not baseline.   Communication   Communication: No difficulties  Cognition Arousal/Alertness: Awake/alert Behavior During Therapy: Restless Overall Cognitive Status: History of cognitive impairments - at baseline                                 General Comments: Cognition is baseline         Exercises Total Joint Exercises Ankle Circles/Pumps:  AROM;Both;20 reps Heel Slides: AAROM;Both;10 reps Hip ABduction/ADduction: AAROM;10 reps;Both   Assessment/Plan    PT Assessment Patient needs continued PT services  PT Problem List Decreased strength;Decreased range of motion;Decreased activity tolerance;Decreased balance;Decreased mobility;Decreased knowledge of use of DME;Pain;Decreased knowledge of precautions;Decreased safety awareness;Decreased cognition       PT Treatment Interventions DME instruction;Gait training;Functional mobility training;Therapeutic activities;Therapeutic exercise;Balance training;Patient/family education;Modalities    PT Goals (Current goals can be found in the Care Plan section)  Acute Rehab PT Goals Patient Stated Goal: daughter wants her mother to go to rehab to get back on her feet and walking again PT Goal Formulation: With family Time For Goal Achievement: 08/07/18 Potential to Achieve Goals: Good    Frequency Min 3X/week           AM-PAC PT "6 Clicks" Mobility  Outcome Measure Help needed turning from your back to your side while in a flat bed without using bedrails?: Total Help needed moving from lying on your back to sitting on the side of a flat bed without using bedrails?: Total Help needed moving to and from a bed to a chair (including a wheelchair)?: Total Help needed standing up from a chair using your arms (e.g., wheelchair or bedside chair)?: Total Help needed to walk in hospital room?: Total Help needed climbing 3-5 steps with a railing? : Total 6 Click Score: 6    End of Session Equipment Utilized During Treatment: Oxygen Activity Tolerance: Patient limited by pain Patient left: in bed;with call bell/phone within reach;with bed alarm set;with family/visitor present   PT Visit Diagnosis: Muscle weakness (generalized) (M62.81);Difficulty in walking, not elsewhere classified (R26.2);Pain Pain - Right/Left: Left Pain - part of body: Leg    Time: 3094-0768 PT Time Calculation  (min) (ACUTE ONLY): 24 min   Charges:          Lurena Joiner B. Quantavia Frith, PT, DPT  Acute Rehabilitation #[phone] pager #[phone] office   PT Evaluation $PT Eval Moderate Complexity: 1 Mod PT Treatments $Therapeutic Activity: 8-22 mins        07/24/2018, 1:51 PM

## 2018-07-24 NOTE — Progress Notes (Signed)
Initial Nutrition Assessment  DOCUMENTATION CODES:  Not applicable  INTERVENTION:  Ensure Enlive po TID, each supplement provides 350 kcal and 20 grams of protein   Gravy with L+D  NUTRITION DIAGNOSIS:  Increased nutrient needs related to post-op healing as evidenced by estimated nutritional requirements for this outcome  GOAL:  Patient will meet greater than or equal to 90% of their needs  MONITOR:  PO intake, Supplement acceptance, Labs, I & O's  REASON FOR ASSESSMENT:  Malnutrition Screening Tool    ASSESSMENT:  85 y/o female PMHx dementia, HTN/HLD. Fell at [facility] 2/6, but imaging in ED was negative and was d/cd back. Returned to ED 2/7 w/ severe elbow pain and inability to walk. Reimaging showed pelvic fx and displaced intra-articular olecranon fx w/ soft tissue swelling. Referred for admission.   Pt with dementia. All information obtained from individual at bedside. Karma Greaser says she has been the pts caregiver for "almost 40 years". Noted the caregiver refers to pt as "mom".   Caregiver says the pt was only just placed into a SNF the first of this month. Prior to that she was living at home under caregiver support. Caregiver says the pt's intake largely consists of oral supplements as, due to her dementia, the pt will not eat many solid foods. The patient will not eat any meat at all, and most of the solid items she eats are either dry cereals, breads and other "soft" items (mashed potatoes, creamy textures). Caregiver notes the pt would eat better with gravy. RD will add to trays. When pt was at home, caregiver could get patient to drink 2-3 Ensures/day. She is very much agreeable to starting this up while admitted.    Caregiver notes the pt has a "long history of trouble swallowing". However, the pt does not require altered consistencies and caregiver says this really is only an issue if pt takes too big of bites. When she does this, the pt will feel like she is choking and get  panicked. As such, the pt just needs to take very small bites.  Weight wise, the pt was reported 103 lbs when she entered the SNF several days ago. Daughter reports she was last weighed at 114 lbs (in hospital?). Per chart, the pt was 117.5 lbs 11 months ago and 108 9 months ago. Gradual decline expected with end stage dementia.   At this time, the pt is perseverating on Left arm splint, continually requesting it to be removed throughout discussion. Pt also notes severe pain. RD noted he would ask RN if pt has any prn meds to help calm down.   Labs: Glu: 133, WBC:12.3, BUN/Creat:37/1.75 Meds: Seroquel, ivf, iv abx  Recent Labs  Lab 07/23/18 1205  NA 138  K 3.8  CL 103  CO2 24  BUN 37*  CREATININE 1.75*  CALCIUM 9.0  GLUCOSE 133*   NUTRITION - FOCUSED PHYSICAL EXAM: Deferred d/t pt severe pain/agitation  Diet Order:   Diet Order            Diet regular Room service appropriate? Yes; Fluid consistency: Thin  Diet effective now             EDUCATION NEEDS:  No education needs have been identified at this time  Skin:  Skin tear to Knee. Splint to LUE.   Last BM:  Unknown  Height:  Ht Readings from Last 1 Encounters:  07/22/18 5\' 2"  (1.575 m)   Weight:  Wt Readings from Last 1 Encounters:  07/24/18  49 kg   Wt Readings from Last 10 Encounters:  07/24/18 49 kg  07/22/18 54.4 kg  04/07/18 50.2 kg  10/18/17 49 kg  09/21/17 53.1 kg  08/26/17 53.3 kg  04/07/17 56.7 kg  03/23/17 56.4 kg  09/16/16 53.5 kg  01/21/16 51.7 kg   Ideal Body Weight:  50 kg  BMI:  Body mass index is 19.75 kg/m.  Estimated Nutritional Needs:  Kcal:  1500-1700 (31-35 kcal/kg bw) Protein:  70-80g Pro (1.4-1.6g/kg bw) Fluid:  1.2-1.5 L fluid (25-30 ml/kg bw)  Christophe Louis RD, LDN, CNSC Clinical Nutrition Available Tues-Sat via Pager: [phone] 07/24/2018 6:04 PM

## 2018-07-24 NOTE — Consult Note (Signed)
ORTHOPAEDIC CONSULTATION  REQUESTING PHYSICIAN: Barnetta Chapelgbata, Sylvester I, MD  Chief Complaint: Left olecranon fracture, left inferior pubic ramus fracture  HPI: Heather Floyd is a 85 y.o. female who presents with above mentioned conditions s/p multiple mechanical falls at her [facility].  She is accompanied by her caretaker and daughter.  I performed a left partial hip replacement approximately 9 months ago from which the patient did quite well.  Patient is a pleasantly demented female.  She was placed in a long arm splint.  She was admitted due to inability to comfortably ambulate due to the pubic ramus fracture.  Past Medical History:  Diagnosis Date  . Abnormality of gait 09/08/2014  . Carotid artery occlusion   . Concussion with loss of consciousness 09/08/2014  . DJD (degenerative joint disease) of cervical spine   . Hyperlipidemia   . Hypertension   . Memory disturbance 03/29/2013  . Osteoporosis   . Personal history of colonic polyps   . RLS (restless legs syndrome)   . Sleep disorder   . TIA (transient ischemic attack)   . Varicose veins    Past Surgical History:  Procedure Laterality Date  . ANTERIOR AND POSTERIOR VAGINAL REPAIR W/ SACROSPINOUS LIGAMENT SUSPENSION    . CESAREAN SECTION    . ENDOVENOUS ABLATION SAPHENOUS VEIN W/ LASER    . HEMORRHOID SURGERY    . JOINT REPLACEMENT    . REDUCTION MAMMAPLASTY    . TOTAL HIP ARTHROPLASTY Left 10/19/2017   Procedure: ANTERIOR HEMI HIP;  Surgeon: Tarry KosXu, Naiping M, MD;  Location: [facility] OR;  Service: Orthopedics;  Laterality: Left;   Social History   Socioeconomic History  . Marital status: Widowed    Spouse name: Not on file  . Number of children: 3  . Years of education: 3812  . Highest education level: Not on file  Occupational History  . Occupation: retired  Engineer, productionocial Needs  . Financial resource strain: Not on file  . Food insecurity:    Worry: Not on file    Inability: Not on file  . Transportation needs:    Medical: Not on file    Non-medical: Not on file  Tobacco Use  . Smoking status: Former Smoker    Last attempt to quit: 06/16/1980    Years since quitting: 38.1  . Smokeless tobacco: Never Used  Substance and Sexual Activity  . Alcohol use: No  . Drug use: No  . Sexual activity: Not on file  Lifestyle  . Physical activity:    Days per week: Not on file    Minutes per session: Not on file  . Stress: Not on file  Relationships  . Social connections:    Talks on phone: Not on file    Gets together: Not on file    Attends religious service: Not on file    Active member of club or organization: Not on file    Attends meetings of clubs or organizations: Not on file    Relationship status: Not on file  Other Topics Concern  . Not on file  Social History Narrative   Lives at home w/ her daughter   Right-handed   Caffeine: coffee in the morning and Coke during the day   Family History  Problem Relation Age of Onset  . Mental illness Mother   . Alzheimer's disease Mother   . Cancer Father   . Bone cancer Father   . Dementia Sister    - negative except otherwise stated in the family  history section Allergies  Allergen Reactions  . Aspirin Other (See Comments)    Makes blood too thin  . Codeine Nausea Only   Prior to Admission medications   Medication Sig Start Date End Date Taking? Authorizing Provider  acetaminophen (TYLENOL) 160 MG/5ML solution Take 15.6 mLs (500 mg total) by mouth every 6 (six) hours as needed for mild pain. 07/22/18  Yes Robinson, [place] N, PA-C  amLODipine (NORVASC) 5 MG tablet Take 5 mg by mouth daily. 02/01/18  Yes [provider]  gabapentin (NEURONTIN) 100 MG capsule Take 100 mg by mouth 3 (three) times daily. 03/30/18  Yes [provider]  hydrOXYzine (ATARAX/VISTARIL) 25 MG tablet Take 1 tablet (25 mg total) by mouth 3 (three) times daily as needed. 07/02/18  Yes Butch Penny, NP  lisinopril (PRINIVIL,ZESTRIL) 40 MG tablet Take 40 mg by mouth daily.   Yes  [provider]  QUEtiapine (SEROQUEL) 50 MG tablet Take 1 tablet (50 mg total) by mouth at bedtime. 04/07/18  Yes Butch Penny, NP  sertraline (ZOLOFT) 100 MG tablet Take 1 tablet (100 mg total) by mouth daily. 09/21/17  Yes Butch Penny, NP  traZODone (DESYREL) 50 MG tablet Take 1.5 tablets (75 mg total) by mouth at bedtime. 07/02/18  Yes Butch Penny, NP   Dg Chest 1 View  Result Date: 07/23/2018 CLINICAL DATA:  Shortness of breath, multiple recent falls EXAM: CHEST  1 VIEW COMPARISON:  10/21/2017 FINDINGS: Normal heart size and mediastinal contours. Atherosclerotic calcification aorta. Bronchitic changes with minimal bibasilar atelectasis. Tiny LEFT pleural effusion, decreased. No acute infiltrate or pneumothorax. Bones demineralized. IMPRESSION: Bronchitic changes with minimal bibasilar atelectasis and improved LEFT pleural effusion. Electronically Signed   By: Ulyses Southward M.D.   On: 07/23/2018 13:35   Dg Elbow 2 Views Left  Result Date: 07/23/2018 CLINICAL DATA:  Proximal ulna fracture. EXAM: LEFT ELBOW - 2 VIEW 4:43 p.m. COMPARISON:  Radiographs dated 07/23/2018 at 1:05 p.m. FINDINGS: The patient is been placed in a splint since the prior study. There is a distracted fracture of the olecranon process of the proximal ulna. Distraction is approximately 12 mm. There is also slight rotation of the proximal fragment. There is also an impaction fracture of the articular surface of the proximal ulna seen on the lateral view. Hemarthrosis.  Distal humerus and proximal radius are intact. IMPRESSION: Distracted slightly rotated fracture of the olecranon process of the proximal ulna. Impaction fracture of the articular surface of ulna anterior to the olecranon process. Electronically Signed   By: Francene Boyers M.D.   On: 07/23/2018 16:57   Dg Elbow 2 Views Left  Result Date: 07/23/2018 CLINICAL DATA:  Severe LEFT arm pain, multiple recent falls EXAM: LEFT ELBOW - 2 VIEW COMPARISON:  None  FINDINGS: Osseous demineralization. Radiocapitellar alignment normal. No true lateral view. Displaced intra-articular olecranon fracture with significant dorsal soft tissue swelling. IMPRESSION: Displaced intra-articular olecranon fracture with significant soft tissue swelling. Electronically Signed   By: Ulyses Southward M.D.   On: 07/23/2018 13:31   Dg Wrist Complete Left  Result Date: 07/23/2018 CLINICAL DATA:  Multiple recent falls, severe LEFT arm pain EXAM: LEFT WRIST - COMPLETE 3+ VIEW COMPARISON:  04/12/2018 FINDINGS: Osseous demineralization. Joint space narrowing throughout carpus. No acute fracture, dislocation, or bone destruction. IMPRESSION: Osseous demineralization with advanced degenerative changes of the LEFT wrist. No acute abnormalities. Electronically Signed   By: Ulyses Southward M.D.   On: 07/23/2018 13:34   Dg Knee 2 Views Right  Result Date:  07/22/2018 CLINICAL DATA:  Pt to ED with c/o of an unwitnessed fall. Pt is from [facility] facility and was in the shower this morning, pt fell to the bottom of the shower. Staff was not present but heard it, and found her sitting up in the tub. Pt has hx of alz dementia. When [facility] arrived patient was walking around but complaining of right hip and right knee pain 10/10. No deformities present. EXAM: RIGHT KNEE - 1-2 VIEW COMPARISON:  None. FINDINGS: No evidence of fracture, dislocation, or joint effusion. No evidence of arthropathy or other focal bone abnormality. Soft tissues are unremarkable. IMPRESSION: Negative. Electronically Signed   By: Amie Portland M.D.   On: 07/22/2018 15:17   Ct Head Wo Contrast  Result Date: 07/23/2018 CLINICAL DATA:  LU, EDISON V on 07/23/2018 at  1:03 PM EXAM: CT HEAD WITHOUT CONTRAST CT CERVICAL SPINE WITHOUT CONTRAST TECHNIQUE: Multidetector CT imaging of the head and cervical spine was performed following the standard protocol without intravenous contrast. Multiplanar CT image reconstructions of the cervical spine were  also generated. COMPARISON:  07/22/2018. FINDINGS: CT HEAD FINDINGS Brain: No evidence of acute infarction, hemorrhage, hydrocephalus, extra-axial collection or mass lesion/mass effect. Mild ventricular sulcal enlargement reflecting age-appropriate volume loss. Mild white matter hypoattenuation consistent with chronic microvascular ischemic change. Vascular: No hyperdense vessel or unexpected calcification. Skull: Normal. Negative for fracture or focal lesion. Sinuses/Orbits: No acute finding. Other: None. CT CERVICAL SPINE FINDINGS Images degraded by motion. Alignment: Normal. Skull base and vertebrae: No acute fracture. No primary bone lesion or focal pathologic process. Soft tissues and spinal canal: No prevertebral fluid or swelling. No visible canal hematoma. No soft tissue masses.  No adenopathy. Disc levels: Mild loss of disc height at C4-C5. Moderate loss of disc height at C5-C6 and C6-C7. Facet degenerative changes noted greatest on the right at C3-C4 and C4-C5. No convincing disc herniation. Upper chest: No acute findings.  Clear lung apices. Other: None. IMPRESSION: HEAD CT 1. No acute intracranial abnormalities. 2. No skull fracture. CERVICAL CT 1. No fracture or acute finding. Electronically Signed   By: Amie Portland M.D.   On: 07/23/2018 13:10   Ct Head Wo Contrast  Result Date: 07/22/2018 CLINICAL DATA:  Unwitnessed fall this morning. History of Alzheimer's dementia. RIGHT hip pain and RIGHT knee pain. EXAM: CT HEAD WITHOUT CONTRAST CT CERVICAL SPINE WITHOUT CONTRAST TECHNIQUE: Multidetector CT imaging of the head and cervical spine was performed following the standard protocol without intravenous contrast. Multiplanar CT image reconstructions of the cervical spine were also generated. COMPARISON:  Head CT dated 03/16/2017. FINDINGS: CT HEAD FINDINGS Brain: Ventricles are stable in size and configuration. Mild chronic small vessel ischemic change noted in the deep periventricular white matter  regions. No mass, hemorrhage, edema or other evidence of acute parenchymal abnormality. No extra-axial hemorrhage. Vascular: Chronic calcified atherosclerotic changes of the large vessels at the skull base. No unexpected hyperdense vessel. Skull: Normal. Negative for fracture or focal lesion. Sinuses/Orbits: No acute finding. Other: Scalp edema overlying the RIGHT frontoparietal bone. No underlying fracture. CT CERVICAL SPINE FINDINGS Alignment: Dextroscoliosis of the cervicothoracic spine, possibly accentuated by patient positioning. No evidence of acute vertebral body subluxation. Skull base and vertebrae: No fracture line or displaced fracture fragment appreciated. Facet joints appear appropriately aligned. Soft tissues and spinal canal: No prevertebral fluid or swelling. No visible canal hematoma. Disc levels: Mild degenerative spondylosis at the C5-6 and C6-7 levels. No significant central canal stenosis at any level. Upper chest: No  acute findings. Other: Bilateral carotid atherosclerosis. IMPRESSION: 1. Scalp edema overlying the RIGHT frontoparietal bone. No underlying fracture. 2. No acute intracranial abnormality. No intracranial mass, hemorrhage or edema. No skull fracture. 3. No fracture or acute subluxation within the cervical spine. 4. Carotid atherosclerosis. Electronically Signed   By: Bary Richard M.D.   On: 07/22/2018 16:15   Ct Cervical Spine Wo Contrast  Result Date: 07/23/2018 CLINICAL DATA:  LU, EDISON V on 07/23/2018 at  1:03 PM EXAM: CT HEAD WITHOUT CONTRAST CT CERVICAL SPINE WITHOUT CONTRAST TECHNIQUE: Multidetector CT imaging of the head and cervical spine was performed following the standard protocol without intravenous contrast. Multiplanar CT image reconstructions of the cervical spine were also generated. COMPARISON:  07/22/2018. FINDINGS: CT HEAD FINDINGS Brain: No evidence of acute infarction, hemorrhage, hydrocephalus, extra-axial collection or mass lesion/mass effect. Mild  ventricular sulcal enlargement reflecting age-appropriate volume loss. Mild white matter hypoattenuation consistent with chronic microvascular ischemic change. Vascular: No hyperdense vessel or unexpected calcification. Skull: Normal. Negative for fracture or focal lesion. Sinuses/Orbits: No acute finding. Other: None. CT CERVICAL SPINE FINDINGS Images degraded by motion. Alignment: Normal. Skull base and vertebrae: No acute fracture. No primary bone lesion or focal pathologic process. Soft tissues and spinal canal: No prevertebral fluid or swelling. No visible canal hematoma. No soft tissue masses.  No adenopathy. Disc levels: Mild loss of disc height at C4-C5. Moderate loss of disc height at C5-C6 and C6-C7. Facet degenerative changes noted greatest on the right at C3-C4 and C4-C5. No convincing disc herniation. Upper chest: No acute findings.  Clear lung apices. Other: None. IMPRESSION: HEAD CT 1. No acute intracranial abnormalities. 2. No skull fracture. CERVICAL CT 1. No fracture or acute finding. Electronically Signed   By: Amie Portland M.D.   On: 07/23/2018 13:10   Ct Cervical Spine Wo Contrast  Result Date: 07/22/2018 CLINICAL DATA:  Unwitnessed fall this morning. History of Alzheimer's dementia. RIGHT hip pain and RIGHT knee pain. EXAM: CT HEAD WITHOUT CONTRAST CT CERVICAL SPINE WITHOUT CONTRAST TECHNIQUE: Multidetector CT imaging of the head and cervical spine was performed following the standard protocol without intravenous contrast. Multiplanar CT image reconstructions of the cervical spine were also generated. COMPARISON:  Head CT dated 03/16/2017. FINDINGS: CT HEAD FINDINGS Brain: Ventricles are stable in size and configuration. Mild chronic small vessel ischemic change noted in the deep periventricular white matter regions. No mass, hemorrhage, edema or other evidence of acute parenchymal abnormality. No extra-axial hemorrhage. Vascular: Chronic calcified atherosclerotic changes of the large  vessels at the skull base. No unexpected hyperdense vessel. Skull: Normal. Negative for fracture or focal lesion. Sinuses/Orbits: No acute finding. Other: Scalp edema overlying the RIGHT frontoparietal bone. No underlying fracture. CT CERVICAL SPINE FINDINGS Alignment: Dextroscoliosis of the cervicothoracic spine, possibly accentuated by patient positioning. No evidence of acute vertebral body subluxation. Skull base and vertebrae: No fracture line or displaced fracture fragment appreciated. Facet joints appear appropriately aligned. Soft tissues and spinal canal: No prevertebral fluid or swelling. No visible canal hematoma. Disc levels: Mild degenerative spondylosis at the C5-6 and C6-7 levels. No significant central canal stenosis at any level. Upper chest: No acute findings. Other: Bilateral carotid atherosclerosis. IMPRESSION: 1. Scalp edema overlying the RIGHT frontoparietal bone. No underlying fracture. 2. No acute intracranial abnormality. No intracranial mass, hemorrhage or edema. No skull fracture. 3. No fracture or acute subluxation within the cervical spine. 4. Carotid atherosclerosis. Electronically Signed   By: Bary Richard M.D.   On: 07/22/2018 16:15  Dg Shoulder Left  Result Date: 07/23/2018 CLINICAL DATA:  Severe LEFT arm pain, multiple recent falls EXAM: LEFT SHOULDER - 2+ VIEW COMPARISON:  12/29/2008 FINDINGS: Osseous demineralization. AC joint alignment normal. No acute fracture, dislocation, or bone destruction.  Fall Visualized ribs intact. IMPRESSION: No acute abnormalities. Electronically Signed   By: Ulyses SouthwardMark  Boles M.D.   On: 07/23/2018 13:29   Dg Hip Unilat  With Pelvis 2-3 Views Right  Result Date: 07/22/2018 CLINICAL DATA:  Pt to ED with c/o of an unwitnessed fall. [facility] and was in the shower this morning, pt fell to the bottom of the shower. Staff was not present but heard it, and found her sitting up in the tub. Pt has hx of alz dementia. When [facility]  arrived patient was walking around but complaining of right hip and right knee pain 10/10. No deformities present. EXAM: DG HIP (WITH OR WITHOUT PELVIS) 2-3V RIGHT COMPARISON:  None. FINDINGS: No fracture.  No bone lesion. Left hip prosthesis is well seated and aligned. Right hip joint, SI joints and symphysis pubis are normally spaced and aligned. Bones are demineralized. Soft tissues are unremarkable. IMPRESSION: No fracture, dislocation or acute finding. Electronically Signed   By: Amie Portlandavid  Ormond M.D.   On: 07/22/2018 15:17   Dg Hips Bilat With Pelvis Min 5 Views  Result Date: 07/23/2018 CLINICAL DATA:  Multiple recent falls, BILATERAL hip pain EXAM: DG HIP (WITH OR WITHOUT PELVIS) 5+V BILAT COMPARISON:  07/22/2018 FINDINGS: Osseous demineralization. LEFT hip prosthesis. Minimal narrowing of RIGHT hip joint. SI joints preserved. Nondisplaced fracture of the LEFT inferior pubic ramus. No additional fracture, dislocation, or bone destruction. IMPRESSION: Fracture LEFT inferior pubic ramus. Electronically Signed   By: Ulyses SouthwardMark  Boles M.D.   On: 07/23/2018 13:37   - pertinent xrays, CT, MRI studies were reviewed and independently interpreted  Positive ROS: All other systems have been reviewed and were otherwise negative with the exception of those mentioned in the HPI and as above.  Physical Exam: General: no acute distress Cardiovascular: No pedal edema Respiratory: No cyanosis, no use of accessory musculature GI: No organomegaly, abdomen is soft and non-tender Skin: No lesions in the area of chief complaint Neurologic: Sensation intact distally Psychiatric: Patient has advanced dementia Lymphatic: No axillary or cervical lymphadenopathy  MUSCULOSKELETAL:  - moderate swelling of left arm, no skin compromise, NVI - LLE NVI  Assessment: 1. Displaced left olecranon fracture 2. Left inferior pubic ramus fracture  Plan: - pain control and mobilization with PT in regards to the ramus fracture -  nonop treatment - olecranon fracture would benefit from ORIF - r/b/a reviewed with daughter - will plan for surgery Monday afternoon  Thank you for the consult and the opportunity to see Ms. Schimming  N. Glee ArvinMichael Xu, MD [facility] [phone] 10:49 AM

## 2018-07-24 NOTE — Progress Notes (Signed)
PROGRESS NOTE    Heather PandaGail M Varon  ZOX:[id number] DOB: 01/14/1933 DOA: 07/23/2018 PCP: [name], Charles Alan, MD  Outpatient Specialists:   Brief Narrative: Patient is an 85 year old Caucasian female, past medical history significant for dementia, TIA, restless leg syndrome, hypertension, hyperlipidemia, carotid artery occlusion and abnormal gait.  Patient was admitted following recurrent falls.  Imaging work-up done revealed left elbow (displaced left olecranon) fracture and left inferior pubic ramus fracture.  Patient also has acute kidney injury and likely UTI.  The plan is to proceed with open reduction and internal fixation of the left elbow fracture.  Pubic ramus fracture will be managed conservatively.  Patient was seen alongside patient's care.  Patient could not or would not give any significant history due to dementing illness.  Assessment & Plan:   Principal Problem:   Closed olecranon fracture, left, initial encounter Active Problems:   Memory disturbance   Hyperlipidemia   Hypertension   RLS (restless legs syndrome)   TIA (transient ischemic attack)   Dementia (HCC)   Dehydration   Fall   Acute lower UTI   Closed fracture of left inferior pubic ramus (HCC)   Multiple falls over the last 24 hours with resultant left elbow fracture and left inferior pubic ramus fracture:  Splint applied by EDP over the left upper extremity,  Orthopedics consulted  For ORIF of left elbow fracture.   Inferior pubic ramus fracture being managed conservatively.    AKI: Probably related to volume depletion. Continue IV fluids. Renal panel in the morning. Further work-up if serum creatinine remained significantly elevated.  Likely UTI:  Follow urine cultures.   Start IV Rocephin.   Urine cultures may not grow any organism as patient has been on antibiotics for UTI recently (as per collateral informant).    Hypertension: Well controlled.   Hypoxia on arrival, but she denies any cough,  fever or sob.  On 3 lit of Hauser oxygen and CXR shows bronchitis changes.  Influenza PCR is negative.  Monitor.   Dementia:  Stable. No agitation seen.   DVT prophylaxis: Lovenox.  Code Status: DNR Family Communication:  Carer at bedside Disposition Plan:  Depends on hospital course..  Consults called:  Dr Roda ShuttersXu with [facility].   Procedures:   ORIF of left elbow on Monday, 07/26/2018  Antimicrobials:   Start IV Rocephin   Subjective: No significant history from patient.  Objective: Vitals:   07/23/18 1820 07/23/18 2008 07/24/18 0424 07/24/18 1139  BP: 133/63 121/80 114/62 (!) 114/56  Pulse: (!) 103 87 (!) 120 100  Resp: 16 16 14 16   Temp: 97.7 F (36.5 C) (!) 97.5 F (36.4 C)  98.9 F (37.2 C)  TempSrc: Oral Oral  Oral  SpO2: (!) 88% 95% (!) 78% 94%    Intake/Output Summary (Last 24 hours) at 07/24/2018 1342 Last data filed at 07/24/2018 0900 Gross per 24 hour  Intake 120 ml  Output 200 ml  Net -80 ml   There were no vitals filed for this visit.  Examination:  General exam: Appears calm and comfortable. Respiratory system: Clear to auscultation.  Cardiovascular system: S1 & S2 heard. Gastrointestinal system: Abdomen is nondistended, soft and nontender. No organomegaly or masses felt. Normal bowel sounds heard. Central nervous system: Awake and alert.   Extremities: No leg edema.  Data Reviewed: I have personally reviewed following labs and imaging studies  CBC: Recent Labs  Lab 07/23/18 1205  WBC 12.3*  NEUTROABS 11.0*  HGB 12.3  HCT 37.2  MCV  91.4  PLT 188   Basic Metabolic Panel: Recent Labs  Lab 07/23/18 1205  NA 138  K 3.8  CL 103  CO2 24  GLUCOSE 133*  BUN 37*  CREATININE 1.75*  CALCIUM 9.0   GFR: Estimated Creatinine Clearance: 19.3 mL/min (A) (by C-G formula based on SCr of 1.75 mg/dL (H)). Liver Function Tests: Recent Labs  Lab 07/23/18 1205  AST 39  ALT 24  ALKPHOS 51  BILITOT 0.6  PROT 7.1  ALBUMIN 4.2   No  results for input(s): LIPASE, AMYLASE in the last 168 hours. No results for input(s): AMMONIA in the last 168 hours. Coagulation Profile: No results for input(s): INR, PROTIME in the last 168 hours. Cardiac Enzymes: No results for input(s): CKTOTAL, CKMB, CKMBINDEX, TROPONINI in the last 168 hours. BNP (last 3 results) No results for input(s): PROBNP in the last 8760 hours. HbA1C: No results for input(s): HGBA1C in the last 72 hours. CBG: No results for input(s): GLUCAP in the last 168 hours. Lipid Profile: No results for input(s): CHOL, HDL, LDLCALC, TRIG, CHOLHDL, LDLDIRECT in the last 72 hours. Thyroid Function Tests: No results for input(s): TSH, T4TOTAL, FREET4, T3FREE, THYROIDAB in the last 72 hours. Anemia Panel: No results for input(s): VITAMINB12, FOLATE, FERRITIN, TIBC, IRON, RETICCTPCT in the last 72 hours. Urine analysis:    Component Value Date/Time   COLORURINE YELLOW 07/23/2018 1640   APPEARANCEUR HAZY (A) 07/23/2018 1640   LABSPEC 1.020 07/23/2018 1640   PHURINE 5.0 07/23/2018 1640   GLUCOSEU NEGATIVE 07/23/2018 1640   HGBUR NEGATIVE 07/23/2018 1640   BILIRUBINUR NEGATIVE 07/23/2018 1640   KETONESUR 5 (A) 07/23/2018 1640   PROTEINUR NEGATIVE 07/23/2018 1640   UROBILINOGEN 1.0 09/30/2014 1211   NITRITE POSITIVE (A) 07/23/2018 1640   LEUKOCYTESUR NEGATIVE 07/23/2018 1640   Sepsis Labs: @LABRCNTIP (procalcitonin:4,lacticidven:4)  )No results found for this or any previous visit (from the past 240 hour(s)).       Radiology Studies: Dg Chest 1 View  Result Date: 07/23/2018 CLINICAL DATA:  Shortness of breath, multiple recent falls EXAM: CHEST  1 VIEW COMPARISON:  10/21/2017 FINDINGS: Normal heart size and mediastinal contours. Atherosclerotic calcification aorta. Bronchitic changes with minimal bibasilar atelectasis. Tiny LEFT pleural effusion, decreased. No acute infiltrate or pneumothorax. Bones demineralized. IMPRESSION: Bronchitic changes with minimal  bibasilar atelectasis and improved LEFT pleural effusion. Electronically Signed   By: Ulyses Southward M.D.   On: 07/23/2018 13:35   Dg Elbow 2 Views Left  Result Date: 07/23/2018 CLINICAL DATA:  Proximal ulna fracture. EXAM: LEFT ELBOW - 2 VIEW 4:43 p.m. COMPARISON:  Radiographs dated 07/23/2018 at 1:05 p.m. FINDINGS: The patient is been placed in a splint since the prior study. There is a distracted fracture of the olecranon process of the proximal ulna. Distraction is approximately 12 mm. There is also slight rotation of the proximal fragment. There is also an impaction fracture of the articular surface of the proximal ulna seen on the lateral view. Hemarthrosis.  Distal humerus and proximal radius are intact. IMPRESSION: Distracted slightly rotated fracture of the olecranon process of the proximal ulna. Impaction fracture of the articular surface of ulna anterior to the olecranon process. Electronically Signed   By: Francene Boyers M.D.   On: 07/23/2018 16:57   Dg Elbow 2 Views Left  Result Date: 07/23/2018 CLINICAL DATA:  Severe LEFT arm pain, multiple recent falls EXAM: LEFT ELBOW - 2 VIEW COMPARISON:  None FINDINGS: Osseous demineralization. Radiocapitellar alignment normal. No true lateral view. Displaced intra-articular olecranon fracture  with significant dorsal soft tissue swelling. IMPRESSION: Displaced intra-articular olecranon fracture with significant soft tissue swelling. Electronically Signed   By: Ulyses Southward M.D.   On: 07/23/2018 13:31   Dg Wrist Complete Left  Result Date: 07/23/2018 CLINICAL DATA:  Multiple recent falls, severe LEFT arm pain EXAM: LEFT WRIST - COMPLETE 3+ VIEW COMPARISON:  04/12/2018 FINDINGS: Osseous demineralization. Joint space narrowing throughout carpus. No acute fracture, dislocation, or bone destruction. IMPRESSION: Osseous demineralization with advanced degenerative changes of the LEFT wrist. No acute abnormalities. Electronically Signed   By: Ulyses Southward M.D.   On:  07/23/2018 13:34   Dg Knee 2 Views Right  Result Date: 07/22/2018 CLINICAL DATA:  Pt to ED with c/o of an unwitnessed fall. Pt is from [facility] facility and was in the shower this morning, pt fell to the bottom of the shower. Staff was not present but heard it, and found her sitting up in the tub. Pt has hx of alz dementia. When [facility] arrived patient was walking around but complaining of right hip and right knee pain 10/10. No deformities present. EXAM: RIGHT KNEE - 1-2 VIEW COMPARISON:  None. FINDINGS: No evidence of fracture, dislocation, or joint effusion. No evidence of arthropathy or other focal bone abnormality. Soft tissues are unremarkable. IMPRESSION: Negative. Electronically Signed   By: Amie Portland M.D.   On: 07/22/2018 15:17   Ct Head Wo Contrast  Result Date: 07/23/2018 CLINICAL DATA:  LU, EDISON V on 07/23/2018 at  1:03 PM EXAM: CT HEAD WITHOUT CONTRAST CT CERVICAL SPINE WITHOUT CONTRAST TECHNIQUE: Multidetector CT imaging of the head and cervical spine was performed following the standard protocol without intravenous contrast. Multiplanar CT image reconstructions of the cervical spine were also generated. COMPARISON:  07/22/2018. FINDINGS: CT HEAD FINDINGS Brain: No evidence of acute infarction, hemorrhage, hydrocephalus, extra-axial collection or mass lesion/mass effect. Mild ventricular sulcal enlargement reflecting age-appropriate volume loss. Mild white matter hypoattenuation consistent with chronic microvascular ischemic change. Vascular: No hyperdense vessel or unexpected calcification. Skull: Normal. Negative for fracture or focal lesion. Sinuses/Orbits: No acute finding. Other: None. CT CERVICAL SPINE FINDINGS Images degraded by motion. Alignment: Normal. Skull base and vertebrae: No acute fracture. No primary bone lesion or focal pathologic process. Soft tissues and spinal canal: No prevertebral fluid or swelling. No visible canal hematoma. No soft tissue masses.  No adenopathy.  Disc levels: Mild loss of disc height at C4-C5. Moderate loss of disc height at C5-C6 and C6-C7. Facet degenerative changes noted greatest on the right at C3-C4 and C4-C5. No convincing disc herniation. Upper chest: No acute findings.  Clear lung apices. Other: None. IMPRESSION: HEAD CT 1. No acute intracranial abnormalities. 2. No skull fracture. CERVICAL CT 1. No fracture or acute finding. Electronically Signed   By: Amie Portland M.D.   On: 07/23/2018 13:10   Ct Head Wo Contrast  Result Date: 07/22/2018 CLINICAL DATA:  Unwitnessed fall this morning. History of Alzheimer's dementia. RIGHT hip pain and RIGHT knee pain. EXAM: CT HEAD WITHOUT CONTRAST CT CERVICAL SPINE WITHOUT CONTRAST TECHNIQUE: Multidetector CT imaging of the head and cervical spine was performed following the standard protocol without intravenous contrast. Multiplanar CT image reconstructions of the cervical spine were also generated. COMPARISON:  Head CT dated 03/16/2017. FINDINGS: CT HEAD FINDINGS Brain: Ventricles are stable in size and configuration. Mild chronic small vessel ischemic change noted in the deep periventricular white matter regions. No mass, hemorrhage, edema or other evidence of acute parenchymal abnormality. No extra-axial hemorrhage. Vascular: Chronic  calcified atherosclerotic changes of the large vessels at the skull base. No unexpected hyperdense vessel. Skull: Normal. Negative for fracture or focal lesion. Sinuses/Orbits: No acute finding. Other: Scalp edema overlying the RIGHT frontoparietal bone. No underlying fracture. CT CERVICAL SPINE FINDINGS Alignment: Dextroscoliosis of the cervicothoracic spine, possibly accentuated by patient positioning. No evidence of acute vertebral body subluxation. Skull base and vertebrae: No fracture line or displaced fracture fragment appreciated. Facet joints appear appropriately aligned. Soft tissues and spinal canal: No prevertebral fluid or swelling. No visible canal hematoma. Disc  levels: Mild degenerative spondylosis at the C5-6 and C6-7 levels. No significant central canal stenosis at any level. Upper chest: No acute findings. Other: Bilateral carotid atherosclerosis. IMPRESSION: 1. Scalp edema overlying the RIGHT frontoparietal bone. No underlying fracture. 2. No acute intracranial abnormality. No intracranial mass, hemorrhage or edema. No skull fracture. 3. No fracture or acute subluxation within the cervical spine. 4. Carotid atherosclerosis. Electronically Signed   By: Bary Richard M.D.   On: 07/22/2018 16:15   Ct Cervical Spine Wo Contrast  Result Date: 07/23/2018 CLINICAL DATA:  LU, EDISON V on 07/23/2018 at  1:03 PM EXAM: CT HEAD WITHOUT CONTRAST CT CERVICAL SPINE WITHOUT CONTRAST TECHNIQUE: Multidetector CT imaging of the head and cervical spine was performed following the standard protocol without intravenous contrast. Multiplanar CT image reconstructions of the cervical spine were also generated. COMPARISON:  07/22/2018. FINDINGS: CT HEAD FINDINGS Brain: No evidence of acute infarction, hemorrhage, hydrocephalus, extra-axial collection or mass lesion/mass effect. Mild ventricular sulcal enlargement reflecting age-appropriate volume loss. Mild white matter hypoattenuation consistent with chronic microvascular ischemic change. Vascular: No hyperdense vessel or unexpected calcification. Skull: Normal. Negative for fracture or focal lesion. Sinuses/Orbits: No acute finding. Other: None. CT CERVICAL SPINE FINDINGS Images degraded by motion. Alignment: Normal. Skull base and vertebrae: No acute fracture. No primary bone lesion or focal pathologic process. Soft tissues and spinal canal: No prevertebral fluid or swelling. No visible canal hematoma. No soft tissue masses.  No adenopathy. Disc levels: Mild loss of disc height at C4-C5. Moderate loss of disc height at C5-C6 and C6-C7. Facet degenerative changes noted greatest on the right at C3-C4 and C4-C5. No convincing disc herniation.  Upper chest: No acute findings.  Clear lung apices. Other: None. IMPRESSION: HEAD CT 1. No acute intracranial abnormalities. 2. No skull fracture. CERVICAL CT 1. No fracture or acute finding. Electronically Signed   By: Amie Portland M.D.   On: 07/23/2018 13:10   Ct Cervical Spine Wo Contrast  Result Date: 07/22/2018 CLINICAL DATA:  Unwitnessed fall this morning. History of Alzheimer's dementia. RIGHT hip pain and RIGHT knee pain. EXAM: CT HEAD WITHOUT CONTRAST CT CERVICAL SPINE WITHOUT CONTRAST TECHNIQUE: Multidetector CT imaging of the head and cervical spine was performed following the standard protocol without intravenous contrast. Multiplanar CT image reconstructions of the cervical spine were also generated. COMPARISON:  Head CT dated 03/16/2017. FINDINGS: CT HEAD FINDINGS Brain: Ventricles are stable in size and configuration. Mild chronic small vessel ischemic change noted in the deep periventricular white matter regions. No mass, hemorrhage, edema or other evidence of acute parenchymal abnormality. No extra-axial hemorrhage. Vascular: Chronic calcified atherosclerotic changes of the large vessels at the skull base. No unexpected hyperdense vessel. Skull: Normal. Negative for fracture or focal lesion. Sinuses/Orbits: No acute finding. Other: Scalp edema overlying the RIGHT frontoparietal bone. No underlying fracture. CT CERVICAL SPINE FINDINGS Alignment: Dextroscoliosis of the cervicothoracic spine, possibly accentuated by patient positioning. No evidence of acute vertebral body subluxation. Skull  base and vertebrae: No fracture line or displaced fracture fragment appreciated. Facet joints appear appropriately aligned. Soft tissues and spinal canal: No prevertebral fluid or swelling. No visible canal hematoma. Disc levels: Mild degenerative spondylosis at the C5-6 and C6-7 levels. No significant central canal stenosis at any level. Upper chest: No acute findings. Other: Bilateral carotid atherosclerosis.  IMPRESSION: 1. Scalp edema overlying the RIGHT frontoparietal bone. No underlying fracture. 2. No acute intracranial abnormality. No intracranial mass, hemorrhage or edema. No skull fracture. 3. No fracture or acute subluxation within the cervical spine. 4. Carotid atherosclerosis. Electronically Signed   By: Bary RichardStan  Maynard M.D.   On: 07/22/2018 16:15   Dg Shoulder Left  Result Date: 07/23/2018 CLINICAL DATA:  Severe LEFT arm pain, multiple recent falls EXAM: LEFT SHOULDER - 2+ VIEW COMPARISON:  12/29/2008 FINDINGS: Osseous demineralization. AC joint alignment normal. No acute fracture, dislocation, or bone destruction.  Fall Visualized ribs intact. IMPRESSION: No acute abnormalities. Electronically Signed   By: Ulyses SouthwardMark  Boles M.D.   On: 07/23/2018 13:29   Dg Hip Unilat  With Pelvis 2-3 Views Right  Result Date: 07/22/2018 CLINICAL DATA:  Pt to ED with c/o of an unwitnessed fall. Pt is from [facility] facility and was in the shower this morning, pt fell to the bottom of the shower. Staff was not present but heard it, and found her sitting up in the tub. Pt has hx of alz dementia. When [facility] arrived patient was walking around but complaining of right hip and right knee pain 10/10. No deformities present. EXAM: DG HIP (WITH OR WITHOUT PELVIS) 2-3V RIGHT COMPARISON:  None. FINDINGS: No fracture.  No bone lesion. Left hip prosthesis is well seated and aligned. Right hip joint, SI joints and symphysis pubis are normally spaced and aligned. Bones are demineralized. Soft tissues are unremarkable. IMPRESSION: No fracture, dislocation or acute finding. Electronically Signed   By: Amie Portlandavid  Ormond M.D.   On: 07/22/2018 15:17   Dg Hips Bilat With Pelvis Min 5 Views  Result Date: 07/23/2018 CLINICAL DATA:  Multiple recent falls, BILATERAL hip pain EXAM: DG HIP (WITH OR WITHOUT PELVIS) 5+V BILAT COMPARISON:  07/22/2018 FINDINGS: Osseous demineralization. LEFT hip prosthesis. Minimal narrowing of RIGHT hip joint. SI joints  preserved. Nondisplaced fracture of the LEFT inferior pubic ramus. No additional fracture, dislocation, or bone destruction. IMPRESSION: Fracture LEFT inferior pubic ramus. Electronically Signed   By: Ulyses SouthwardMark  Boles M.D.   On: 07/23/2018 13:37        Scheduled Meds: . enoxaparin (LOVENOX) injection  30 mg Subcutaneous Q24H  . gabapentin  100 mg Oral TID  . QUEtiapine  50 mg Oral QHS  . sertraline  100 mg Oral Daily  . traZODone  75 mg Oral QHS   Continuous Infusions: . sodium chloride 75 mL/hr at 07/24/18 0534  . sodium chloride       LOS: 0 days    Time spent: 25 minutes.   Berton MountSylvester Oziah Vitanza, MD  [facility] Pager #: [phone] 7PM-7AM contact night coverage as above

## 2018-07-24 NOTE — Evaluation (Signed)
Occupational Therapy Evaluation Patient Details Name: Heather Floyd MRN: [id number] DOB: 02-27-1933 Today's Date: 07/24/2018    History of Present Illness 85 y.o. female admitted on 07/23/18 due to repeated falls and inability to walk, possible UTI.  Pt dx with L inferior pubic rami fx and L elbow displaced intra articular olecranon fx (plan for ORIF on Monday 07/26/18) with Dr. Roda Shutters.  Pt with significant PMH of TIA, RLS, memory disturbance, HTN, concussion, carotid artery occlusion, and L THA (anterior hemi hip).   Clinical Impression   Pt admitted with above. She demonstrates the below listed deficits and will benefit from continued OT to maximize safety and independence with BADLs.  Pt with significant cognitive deficits due to h/o dementia.  She only tolerated HOB to 37* for up to 5 mins.  AAROM performed Lt shoulder and hand.  Lt UE elevated on 2 pillows.  She is able to perform simple grooming and feeding with max A, otherwise requires total A for ADLs.  PTA, she was residing in a memory care unit and required assist with ADLs - sounds like min - mod A, per caregiver.  Recommend SNF at discharge.       Follow Up Recommendations  SNF    Equipment Recommendations  None recommended by OT    Recommendations for Other Services       Precautions / Restrictions Precautions Precautions: Fall Required Braces or Orthoses: Sling(can have off in the bed, more for when up) Restrictions Weight Bearing Restrictions: Yes LUE Weight Bearing: Non weight bearing(this may change post op) LLE Weight Bearing: Weight bearing as tolerated      Mobility Bed Mobility               General bed mobility comments: Pt tolerated HOB raised to 37* for up to 5 mins before requesting to "lean back"  Transfers                 General transfer comment: unable     Balance                                           ADL either performed or assessed with clinical judgement   ADL  Overall ADL's : Needs assistance/impaired Eating/Feeding: Maximal assistance;Bed level   Grooming: Wash/dry face;Minimal assistance;Bed level   Upper Body Bathing: Total assistance;Bed level   Lower Body Bathing: Total assistance;Bed level   Upper Body Dressing : Total assistance;Bed level   Lower Body Dressing: Total assistance;Bed level   Toilet Transfer: Total assistance Toilet Transfer Details (indicate cue type and reason): unable          Functional mobility during ADLs: Total assistance       Vision         Perception     Praxis      Pertinent Vitals/Pain Pain Assessment: Faces Faces Pain Scale: Hurts even more Pain Location: back, and Lt UE  Pain Descriptors / Indicators: Grimacing;Guarding Pain Intervention(s): Monitored during session     Hand Dominance Right   Extremity/Trunk Assessment Upper Extremity Assessment Upper Extremity Assessment: LUE deficits/detail LUE Deficits / Details: Lt UE in long arm splint.  Arthritic deformity of hands noted.  She demonstrates ~60% of composite flexion of hand.  Shoulder AAROM [facility]    Lower Extremity Assessment Lower Extremity Assessment: Defer to PT evaluation   Cervical / Trunk Assessment Cervical /  Trunk Assessment: Other exceptions;Kyphotic Cervical / Trunk Exceptions: Pt with Lt lateral flexion of head/neck    Communication Communication Communication: No difficulties   Cognition Arousal/Alertness: Awake/alert Behavior During Therapy: Restless Overall Cognitive Status: History of cognitive impairments - at baseline                                 General Comments: Cognition is baseline   General Comments       Exercises Exercises: Total Joint;General Upper Extremity General Exercises - Upper Extremity Shoulder Flexion: AAROM;Left;10 reps;Supine Digit Composite Flexion: Left;10 reps;AAROM;Supine Composite Extension: AAROM;Left;10 reps;Supine   Shoulder Instructions      Home  Living Family/patient expects to be discharged to:: Skilled nursing facility                                 Additional Comments: Pt is from memory care ALF, but daughter is aware she will likely need SNF placement.       Prior Functioning/Environment Level of Independence: Needs assistance  Gait / Transfers Assistance Needed: Ambulates without assistive devices and has been non ambulatory since her multiple falls PTA (so less than a week).  ADL's / Homemaking Assistance Needed: Caregiver reports pt was able to independently toilet self, but later states pt wore a depends.  She reports pt was able to dress self with direct cues and supervision, and bathed self with step by step instruction  Communication / Swallowing Assistance Needed: NA- most of what she says is conversational, but non sensical to the context          OT Problem List: Decreased strength;Decreased range of motion;Decreased activity tolerance;Impaired balance (sitting and/or standing);Decreased cognition;Decreased coordination;Decreased safety awareness;Decreased knowledge of use of DME or AE;Impaired UE functional use;Pain      OT Treatment/Interventions: Self-care/ADL training;Therapeutic exercise;DME and/or AE instruction;Therapeutic activities;Cognitive remediation/compensation;Patient/family education;Balance training;Splinting    OT Goals(Current goals can be found in the care plan section) Acute Rehab OT Goals OT Goal Formulation: Patient unable to participate in goal setting Time For Goal Achievement: 08/07/18 Potential to Achieve Goals: Good ADL Goals Pt Will Perform Eating: with min assist;sitting Pt Will Perform Grooming: with min assist;sitting Pt Will Transfer to Toilet: with max assist;stand pivot transfer;bedside commode  OT Frequency: Min 2X/week   Barriers to D/C: Decreased caregiver support          Co-evaluation              AM-PAC OT "6 Clicks" Daily Activity     Outcome  Measure Help from another person eating meals?: A Lot Help from another person taking care of personal grooming?: A Lot Help from another person toileting, which includes using toliet, bedpan, or urinal?: Total Help from another person bathing (including washing, rinsing, drying)?: Total Help from another person to put on and taking off regular upper body clothing?: Total Help from another person to put on and taking off regular lower body clothing?: Total 6 Click Score: 8   End of Session    Activity Tolerance: Patient limited by pain;Other (comment)(impaired cognition ) Patient left: in bed;with call bell/phone within reach;with family/visitor present  OT Visit Diagnosis: Pain;Muscle weakness (generalized) (M62.81);Cognitive communication deficit (R41.841) Pain - Right/Left: Left Pain - part of body: Arm                Time: [id number] OT Time Calculation (min): 16  min Charges:  OT General Charges $OT Visit: 1 Visit OT Evaluation $OT Eval Moderate Complexity: 1 Mod  Jeani Hawking, OTR/L Acute Rehabilitation Services Pager [phone] Office [phone]   Jeani Hawking M 07/24/2018, 7:02 PM

## 2018-07-25 LAB — CBC WITH DIFFERENTIAL/PLATELET
Abs Immature Granulocytes: 0.07 10*3/uL (ref 0.00–0.07)
Basophils Absolute: 0 10*3/uL (ref 0.0–0.1)
Basophils Relative: 0 %
Eosinophils Absolute: 0 10*3/uL (ref 0.0–0.5)
Eosinophils Relative: 0 %
HCT: 32.7 % — ABNORMAL LOW (ref 36.0–46.0)
Hemoglobin: 11.4 g/dL — ABNORMAL LOW (ref 12.0–15.0)
Immature Granulocytes: 1 %
Lymphocytes Relative: 10 %
Lymphs Abs: 1 10*3/uL (ref 0.7–4.0)
MCH: 30.7 pg (ref 26.0–34.0)
MCHC: 34.9 g/dL (ref 30.0–36.0)
MCV: 88.1 fL (ref 80.0–100.0)
Monocytes Absolute: 1 10*3/uL (ref 0.1–1.0)
Monocytes Relative: 10 %
Neutro Abs: 7.7 10*3/uL (ref 1.7–7.7)
Neutrophils Relative %: 79 %
Platelets: 180 10*3/uL (ref 150–400)
RBC: 3.71 MIL/uL — ABNORMAL LOW (ref 3.87–5.11)
RDW: 12.7 % (ref 11.5–15.5)
WBC: 9.8 10*3/uL (ref 4.0–10.5)
nRBC: 0 % (ref 0.0–0.2)

## 2018-07-25 LAB — RENAL FUNCTION PANEL
Albumin: 3 g/dL — ABNORMAL LOW (ref 3.5–5.0)
Anion gap: 10 (ref 5–15)
BUN: 17 mg/dL (ref 8–23)
CO2: 22 mmol/L (ref 22–32)
Calcium: 8.1 mg/dL — ABNORMAL LOW (ref 8.9–10.3)
Chloride: 107 mmol/L (ref 98–111)
Creatinine, Ser: 1.19 mg/dL — ABNORMAL HIGH (ref 0.44–1.00)
GFR calc Af Amer: 49 mL/min — ABNORMAL LOW (ref >60)
GFR calc non Af Amer: 42 mL/min — ABNORMAL LOW (ref >60)
Glucose, Bld: 110 mg/dL — ABNORMAL HIGH (ref 70–99)
Phosphorus: 2 mg/dL — ABNORMAL LOW (ref 2.5–4.6)
Potassium: 3.6 mmol/L (ref 3.5–5.1)
Sodium: 139 mmol/L (ref 135–145)

## 2018-07-25 MED ORDER — POTASSIUM CHLORIDE 20 MEQ PO PACK
40.0000 meq | PACK | Freq: Once | ORAL | Status: AC
  Administered 2018-07-25: 40 meq via ORAL
  Filled 2018-07-25: qty 2

## 2018-07-25 MED ORDER — OXYCODONE HCL 5 MG PO TABS
5.0000 mg | ORAL_TABLET | ORAL | Status: DC | PRN
  Administered 2018-07-25 – 2018-07-29 (×8): 5 mg via ORAL
  Filled 2018-07-25 (×9): qty 1

## 2018-07-25 NOTE — Progress Notes (Signed)
PROGRESS NOTE    Heather Floyd  NHA:[id number] DOB: 1933-02-17 DOA: 07/23/2018 PCP: Daisy Floro, MD  Outpatient Specialists:   Brief Narrative: Patient is an 85 year old Caucasian female, past medical history significant for dementia, TIA, restless leg syndrome, hypertension, hyperlipidemia, carotid artery occlusion and abnormal gait.  Patient was admitted following recurrent falls.  Imaging work-up done revealed left elbow (displaced left olecranon) fracture and left inferior pubic ramus fracture.  Patient also has acute kidney injury and likely UTI.  The plan is to proceed with open reduction and internal fixation of the left elbow fracture.  Pubic ramus fracture will be managed conservatively.  Patient was seen alongside patient's care.  Patient could not or would not give any significant history due to dementing illness.  07/25/2018: Patient seen.  Patient looks a lot better today.  Urine culture is growing E. coli.  Volume depletion is resolving.  AKI has resolved significantly.  For surgery (ORIF of left elbow fracture) tomorrow.  Assessment & Plan:   Principal Problem:   Closed olecranon fracture, left, initial encounter Active Problems:   Memory disturbance   Hyperlipidemia   Hypertension   RLS (restless legs syndrome)   TIA (transient ischemic attack)   Dementia (HCC)   Dehydration   Fall   Acute lower UTI   Closed fracture of left inferior pubic ramus (HCC)   Pubic ramus fracture (HCC)   Multiple falls over the last 24 hours with resultant left elbow fracture and left inferior pubic ramus fracture:  Splint applied by EDP over the left upper extremity,  Orthopedics consulted  For ORIF of left elbow fracture.   Inferior pubic ramus fracture being managed conservatively.   07/25/2018: For ORIF left elbow fracture tomorrow.  AKI: Probably related to volume depletion. Continue IV fluids. Renal panel in the morning. Further work-up if serum creatinine remained  significantly elevated. 07/25/2018: Resolved significantly.  Likely prerenal.  Likely UTI:  Follow urine cultures.   Start IV Rocephin.   Urine cultures may not grow any organism as patient has been on antibiotics for UTI recently (as per collateral informant).   07/25/2018: Urine culture is growing E. coli.  Continue IV Rocephin for now.  Hypertension: Well controlled.   Hypoxia on arrival, but she denies any cough, fever or sob.  On 3 lit of [place] oxygen and CXR shows bronchitis changes.  Influenza PCR is negative.  Monitor.   Dementia:  Stable. No agitation seen.   DVT prophylaxis: Lovenox.  Code Status: DNR Family Communication:  Carer at bedside Disposition Plan:  Depends on hospital course..  Consults called:  Dr Roda Shutters with [facility].   Procedures:   ORIF of left elbow on Monday, 07/26/2018  Antimicrobials:   Continue IV Rocephin   Subjective: No significant history from patient.  Objective: Vitals:   07/24/18 1800 07/24/18 2001 07/25/18 0351 07/25/18 1407  BP:  136/90 135/70 124/66  Pulse:  (!) 122 (!) 116 93  Resp:  15 15 16   Temp:   99.3 F (37.4 C) 98.5 F (36.9 C)  TempSrc:   Axillary Oral  SpO2:  91% (!) 85% 98%  Weight: 49 kg       Intake/Output Summary (Last 24 hours) at 07/25/2018 1636 Last data filed at 07/25/2018 1242 Gross per 24 hour  Intake 2469.09 ml  Output 600 ml  Net 1869.09 ml   Filed Weights   07/24/18 1800  Weight: 49 kg    Examination:  General exam: Appears calm and comfortable. Respiratory  system: Clear to auscultation.  Cardiovascular system: S1 & S2 heard. Gastrointestinal system: Abdomen is nondistended, soft and nontender. No organomegaly or masses felt. Normal bowel sounds heard. Central nervous system: Awake and alert.   Extremities: No leg edema.  Data Reviewed: I have personally reviewed following labs and imaging studies  CBC: Recent Labs  Lab 07/23/18 1205 07/25/18 0420  WBC 12.3* 9.8    NEUTROABS 11.0* 7.7  HGB 12.3 11.4*  HCT 37.2 32.7*  MCV 91.4 88.1  PLT 188 180   Basic Metabolic Panel: Recent Labs  Lab 07/23/18 1205 07/25/18 0420  NA 138 139  K 3.8 3.6  CL 103 107  CO2 24 22  GLUCOSE 133* 110*  BUN 37* 17  CREATININE 1.75* 1.19*  CALCIUM 9.0 8.1*  PHOS  --  2.0*   GFR: Estimated Creatinine Clearance: 27.7 mL/min (A) (by C-G formula based on SCr of 1.19 mg/dL (H)). Liver Function Tests: Recent Labs  Lab 07/23/18 1205 07/25/18 0420  AST 39  --   ALT 24  --   ALKPHOS 51  --   BILITOT 0.6  --   PROT 7.1  --   ALBUMIN 4.2 3.0*   No results for input(s): LIPASE, AMYLASE in the last 168 hours. No results for input(s): AMMONIA in the last 168 hours. Coagulation Profile: No results for input(s): INR, PROTIME in the last 168 hours. Cardiac Enzymes: No results for input(s): CKTOTAL, CKMB, CKMBINDEX, TROPONINI in the last 168 hours. BNP (last 3 results) No results for input(s): PROBNP in the last 8760 hours. HbA1C: No results for input(s): HGBA1C in the last 72 hours. CBG: No results for input(s): GLUCAP in the last 168 hours. Lipid Profile: No results for input(s): CHOL, HDL, LDLCALC, TRIG, CHOLHDL, LDLDIRECT in the last 72 hours. Thyroid Function Tests: No results for input(s): TSH, T4TOTAL, FREET4, T3FREE, THYROIDAB in the last 72 hours. Anemia Panel: No results for input(s): VITAMINB12, FOLATE, FERRITIN, TIBC, IRON, RETICCTPCT in the last 72 hours. Urine analysis:    Component Value Date/Time   COLORURINE YELLOW 07/23/2018 1640   APPEARANCEUR HAZY (A) 07/23/2018 1640   LABSPEC 1.020 07/23/2018 1640   PHURINE 5.0 07/23/2018 1640   GLUCOSEU NEGATIVE 07/23/2018 1640   HGBUR NEGATIVE 07/23/2018 1640   BILIRUBINUR NEGATIVE 07/23/2018 1640   KETONESUR 5 (A) 07/23/2018 1640   PROTEINUR NEGATIVE 07/23/2018 1640   UROBILINOGEN 1.0 09/30/2014 1211   NITRITE POSITIVE (A) 07/23/2018 1640   LEUKOCYTESUR NEGATIVE 07/23/2018 1640   Sepsis  Labs: @LABRCNTIP (procalcitonin:4,lacticidven:4)  ) Recent Results (from the past 240 hour(s))  Urine culture     Status: Abnormal (Preliminary result)   Collection Time: 07/23/18  4:40 PM  Result Value Ref Range Status   Specimen Description URINE, CATHETERIZED  Final   Special Requests   Final    Normal Performed at [facility], [address], [place], [place] [address]    Culture >=100,000 COLONIES/mL ESCHERICHIA COLI (A)  Final   Report Status PENDING  Incomplete         Radiology Studies: Dg Elbow 2 Views Left  Result Date: 07/23/2018 CLINICAL DATA:  Proximal ulna fracture. EXAM: LEFT ELBOW - 2 VIEW 4:43 p.m. COMPARISON:  Radiographs dated 07/23/2018 at 1:05 p.m. FINDINGS: The patient is been placed in a splint since the prior study. There is a distracted fracture of the olecranon process of the proximal ulna. Distraction is approximately 12 mm. There is also slight rotation of the proximal fragment. There is also an impaction  fracture of the articular surface of the proximal ulna seen on the lateral view. Hemarthrosis.  Distal humerus and proximal radius are intact. IMPRESSION: Distracted slightly rotated fracture of the olecranon process of the proximal ulna. Impaction fracture of the articular surface of ulna anterior to the olecranon process. Electronically Signed   By: Francene BoyersJames  Maxwell M.D.   On: 07/23/2018 16:57        Scheduled Meds: . feeding supplement (ENSURE ENLIVE)  237 mL Oral TID BM  . gabapentin  100 mg Oral TID  . potassium chloride  40 mEq Oral Once  . QUEtiapine  50 mg Oral QHS  . sertraline  100 mg Oral Daily  . traZODone  75 mg Oral QHS   Continuous Infusions: . sodium chloride 75 mL/hr at 07/24/18 0534  . cefTRIAXone (ROCEPHIN)  IV 1 g (07/25/18 1238)  . sodium chloride       LOS: 1 day    Time spent: 25 minutes.   Berton MountSylvester Ivan Lacher, MD  [facility] Pager #: [phone] 7PM-7AM contact night coverage as  above

## 2018-07-25 NOTE — Progress Notes (Signed)
Patient refusing oxygen therapy throughout the night.   Daughter at bedside is aware of refusal.   Patient combative when RN attempted to place oxygen therapy.   Doctors notified.   Nursing will continue to monitor.

## 2018-07-26 ENCOUNTER — Encounter (HOSPITAL_COMMUNITY): Payer: Self-pay | Admitting: Certified Registered"

## 2018-07-26 ENCOUNTER — Inpatient Hospital Stay (HOSPITAL_COMMUNITY): Payer: Medicare Other | Admitting: Certified Registered Nurse Anesthetist

## 2018-07-26 ENCOUNTER — Encounter (HOSPITAL_COMMUNITY)
Admission: EM | Disposition: A | Discharge status: Skilled Nursing Facility | Payer: Self-pay | Source: Skilled Nursing Facility | Attending: Internal Medicine

## 2018-07-26 DIAGNOSIS — S46312A Strain of muscle, fascia and tendon of triceps, left arm, initial encounter: Secondary | ICD-10-CM

## 2018-07-26 HISTORY — PX: ORIF ELBOW FRACTURE: SHX5031

## 2018-07-26 LAB — URINE CULTURE
Culture: >=100000 — AB
Special Requests: NORMAL

## 2018-07-26 LAB — SURGICAL PCR SCREEN
MRSA, PCR: NEGATIVE
Staphylococcus aureus: NEGATIVE

## 2018-07-26 SURGERY — OPEN REDUCTION INTERNAL FIXATION (ORIF) ELBOW/OLECRANON FRACTURE
Anesthesia: General | Site: Elbow | Laterality: Left

## 2018-07-26 MED ORDER — BUPIVACAINE HCL (PF) 0.25 % IJ SOLN
INTRAMUSCULAR | Status: DC | PRN
  Administered 2018-07-26: 10 mL

## 2018-07-26 MED ORDER — CEFAZOLIN SODIUM-DEXTROSE 2-4 GM/100ML-% IV SOLN
2.0000 g | Freq: Three times a day (TID) | INTRAVENOUS | Status: AC
  Administered 2018-07-27 (×3): 2 g via INTRAVENOUS
  Filled 2018-07-26 (×3): qty 100

## 2018-07-26 MED ORDER — BUPIVACAINE HCL (PF) 0.25 % IJ SOLN
INTRAMUSCULAR | Status: AC
  Filled 2018-07-26: qty 30

## 2018-07-26 MED ORDER — LIDOCAINE 2% (20 MG/ML) 5 ML SYRINGE
INTRAMUSCULAR | Status: DC | PRN
  Administered 2018-07-26: 40 mg via INTRAVENOUS

## 2018-07-26 MED ORDER — ACETAMINOPHEN 10 MG/ML IV SOLN
1000.0000 mg | Freq: Once | INTRAVENOUS | Status: AC
  Administered 2018-07-26: 1000 mg via INTRAVENOUS
  Filled 2018-07-26: qty 100

## 2018-07-26 MED ORDER — ROCURONIUM BROMIDE 50 MG/5ML IV SOSY
PREFILLED_SYRINGE | INTRAVENOUS | Status: DC | PRN
  Administered 2018-07-26: 50 mg via INTRAVENOUS

## 2018-07-26 MED ORDER — 0.9 % SODIUM CHLORIDE (POUR BTL) OPTIME
TOPICAL | Status: DC | PRN
  Administered 2018-07-26: 1000 mL

## 2018-07-26 MED ORDER — MORPHINE SULFATE (PF) 2 MG/ML IV SOLN
INTRAVENOUS | Status: AC
  Filled 2018-07-26: qty 1

## 2018-07-26 MED ORDER — CEFAZOLIN SODIUM-DEXTROSE 2-4 GM/100ML-% IV SOLN
2.0000 g | Freq: Once | INTRAVENOUS | Status: AC
  Administered 2018-07-26: 2 g via INTRAVENOUS
  Filled 2018-07-26: qty 100

## 2018-07-26 MED ORDER — ROCURONIUM BROMIDE 50 MG/5ML IV SOSY
PREFILLED_SYRINGE | INTRAVENOUS | Status: AC
  Filled 2018-07-26: qty 5

## 2018-07-26 MED ORDER — DEXAMETHASONE SODIUM PHOSPHATE 10 MG/ML IJ SOLN
INTRAMUSCULAR | Status: DC | PRN
  Administered 2018-07-26: 6 mg via INTRAVENOUS

## 2018-07-26 MED ORDER — FENTANYL CITRATE (PF) 100 MCG/2ML IJ SOLN
INTRAMUSCULAR | Status: AC
  Administered 2018-07-26: 25 ug via INTRAVENOUS
  Filled 2018-07-26: qty 2

## 2018-07-26 MED ORDER — ONDANSETRON HCL 4 MG/2ML IJ SOLN
4.0000 mg | Freq: Once | INTRAMUSCULAR | Status: AC | PRN
  Administered 2018-07-26: 4 mg via INTRAVENOUS

## 2018-07-26 MED ORDER — LIDOCAINE 2% (20 MG/ML) 5 ML SYRINGE
INTRAMUSCULAR | Status: AC
  Filled 2018-07-26: qty 5

## 2018-07-26 MED ORDER — ONDANSETRON HCL 4 MG/2ML IJ SOLN
INTRAMUSCULAR | Status: AC
  Filled 2018-07-26: qty 2

## 2018-07-26 MED ORDER — ONDANSETRON HCL 4 MG/2ML IJ SOLN
INTRAMUSCULAR | Status: DC | PRN
  Administered 2018-07-26: 4 mg via INTRAVENOUS

## 2018-07-26 MED ORDER — FENTANYL CITRATE (PF) 250 MCG/5ML IJ SOLN
INTRAMUSCULAR | Status: AC
  Filled 2018-07-26: qty 5

## 2018-07-26 MED ORDER — PROPOFOL 10 MG/ML IV BOLUS
INTRAVENOUS | Status: DC | PRN
  Administered 2018-07-26: 70 mg via INTRAVENOUS

## 2018-07-26 MED ORDER — FENTANYL CITRATE (PF) 100 MCG/2ML IJ SOLN
25.0000 ug | INTRAMUSCULAR | Status: DC | PRN
  Administered 2018-07-26: 25 ug via INTRAVENOUS
  Administered 2018-07-26: 50 ug via INTRAVENOUS
  Administered 2018-07-26: 25 ug via INTRAVENOUS

## 2018-07-26 MED ORDER — LACTATED RINGERS IV SOLN
INTRAVENOUS | Status: DC
  Administered 2018-07-26: 14:00:00 via INTRAVENOUS

## 2018-07-26 MED ORDER — FENTANYL CITRATE (PF) 100 MCG/2ML IJ SOLN
INTRAMUSCULAR | Status: DC | PRN
  Administered 2018-07-26: 50 ug via INTRAVENOUS
  Administered 2018-07-26: 25 ug via INTRAVENOUS
  Administered 2018-07-26: 50 ug via INTRAVENOUS
  Administered 2018-07-26: 25 ug via INTRAVENOUS
  Administered 2018-07-26: 50 ug via INTRAVENOUS

## 2018-07-26 MED ORDER — LACTATED RINGERS IV SOLN
INTRAVENOUS | Status: DC | PRN
  Administered 2018-07-26 (×2): via INTRAVENOUS

## 2018-07-26 MED ORDER — PROPOFOL 10 MG/ML IV BOLUS
INTRAVENOUS | Status: AC
  Filled 2018-07-26: qty 20

## 2018-07-26 MED ORDER — DEXAMETHASONE SODIUM PHOSPHATE 10 MG/ML IJ SOLN
INTRAMUSCULAR | Status: AC
  Filled 2018-07-26: qty 1

## 2018-07-26 MED ORDER — SUGAMMADEX SODIUM 200 MG/2ML IV SOLN
INTRAVENOUS | Status: DC | PRN
  Administered 2018-07-26: 150 mg via INTRAVENOUS

## 2018-07-26 MED ORDER — CEFAZOLIN SODIUM 1 G IJ SOLR
INTRAMUSCULAR | Status: AC
  Filled 2018-07-26: qty 20

## 2018-07-26 MED ORDER — ACETAMINOPHEN 10 MG/ML IV SOLN
INTRAVENOUS | Status: AC
  Administered 2018-07-26: 1000 mg
  Filled 2018-07-26: qty 100

## 2018-07-26 SURGICAL SUPPLY — 58 items
ANCH SUT 2.9 KNTLS QUATTRO (Anchor) ×2 IMPLANT
ANCHOR QUATTRO LINK KNTLS 2.9 (Anchor) ×4 IMPLANT
BANDAGE ACE 3X5.8 VEL STRL LF (GAUZE/BANDAGES/DRESSINGS) ×3 IMPLANT
BANDAGE ACE 4X5 VEL STRL LF (GAUZE/BANDAGES/DRESSINGS) ×3 IMPLANT
BIT DRILL HARD BONE 2.9 ANCHOR (DRILL) IMPLANT
BNDG CMPR 9X4 STRL LF SNTH (GAUZE/BANDAGES/DRESSINGS) ×1
BNDG COHESIVE 4X5 TAN STRL (GAUZE/BANDAGES/DRESSINGS) ×3 IMPLANT
BNDG ESMARK 4X9 LF (GAUZE/BANDAGES/DRESSINGS) ×3 IMPLANT
BNDG GAUZE ELAST 4 BULKY (GAUZE/BANDAGES/DRESSINGS) ×7 IMPLANT
CORDS BIPOLAR (ELECTRODE) ×3 IMPLANT
COVER MAYO STAND STRL (DRAPES) ×3 IMPLANT
COVER SURGICAL LIGHT HANDLE (MISCELLANEOUS) ×3 IMPLANT
COVER WAND RF STERILE (DRAPES) ×3 IMPLANT
CUFF TOURNIQUET SINGLE 18IN (TOURNIQUET CUFF) ×3 IMPLANT
DRAPE INCISE IOBAN 66X45 STRL (DRAPES) ×3 IMPLANT
DRAPE OEC MINIVIEW 54X84 (DRAPES) ×2 IMPLANT
DRILL HARD BONE 2.9 ANCHOR (DRILL) ×3
DRSG MEPILEX BORDER 4X4 (GAUZE/BANDAGES/DRESSINGS) ×2 IMPLANT
DRSG XEROFORM 1X8 (GAUZE/BANDAGES/DRESSINGS) ×2 IMPLANT
GAUZE SPONGE 4X4 12PLY STRL (GAUZE/BANDAGES/DRESSINGS) ×3 IMPLANT
GAUZE XEROFORM 1X8 LF (GAUZE/BANDAGES/DRESSINGS) ×3 IMPLANT
GAUZE XEROFORM 5X9 LF (GAUZE/BANDAGES/DRESSINGS) ×2 IMPLANT
GLOVE ECLIPSE 7.0 STRL STRAW (GLOVE) ×8 IMPLANT
GLOVE INDICATOR 7.0 STRL GRN (GLOVE) ×2 IMPLANT
GOWN STRL REUS W/ TWL LRG LVL3 (GOWN DISPOSABLE) ×2 IMPLANT
GOWN STRL REUS W/ TWL XL LVL3 (GOWN DISPOSABLE) ×3 IMPLANT
GOWN STRL REUS W/TWL LRG LVL3 (GOWN DISPOSABLE) ×6
GOWN STRL REUS W/TWL XL LVL3 (GOWN DISPOSABLE) ×9
K-WIRE FIXATION 2.0X6 (WIRE) ×3
KIT BASIN OR (CUSTOM PROCEDURE TRAY) ×3 IMPLANT
KIT TURNOVER KIT B (KITS) ×3 IMPLANT
KWIRE FIXATION 2.0X6 (WIRE) IMPLANT
LOOP VESSEL MAXI BLUE (MISCELLANEOUS) IMPLANT
MANIFOLD NEPTUNE II (INSTRUMENTS) ×3 IMPLANT
NDL HYPO 25GX1X1/2 BEV (NEEDLE) IMPLANT
NEEDLE HYPO 25GX1X1/2 BEV (NEEDLE) IMPLANT
NS IRRIG 1000ML POUR BTL (IV SOLUTION) ×3 IMPLANT
PACK ORTHO EXTREMITY (CUSTOM PROCEDURE TRAY) ×3 IMPLANT
PAD ARMBOARD 7.5X6 YLW CONV (MISCELLANEOUS) ×6 IMPLANT
PAD CAST 4YDX4 CTTN HI CHSV (CAST SUPPLIES) ×2 IMPLANT
PADDING CAST COTTON 4X4 STRL (CAST SUPPLIES) ×6
SCRUB BETADINE 4OZ XXX (MISCELLANEOUS) ×3 IMPLANT
SLING ARM FOAM STRAP MED (SOFTGOODS) ×2 IMPLANT
SOL PREP POV-IOD 4OZ 10% (MISCELLANEOUS) ×6 IMPLANT
SPECIMEN JAR SMALL (MISCELLANEOUS) ×3 IMPLANT
SUT ETHILON 4 0 PS 2 18 (SUTURE) ×2 IMPLANT
SUT MAXBRAID (SUTURE) ×4 IMPLANT
SUT PROLENE 3 0 PS 2 (SUTURE) IMPLANT
SUT VIC AB 2-0 CT1 27 (SUTURE) ×6
SUT VIC AB 2-0 CT1 TAPERPNT 27 (SUTURE) IMPLANT
SUT VIC AB 2-0 CT2 27 (SUTURE) ×2 IMPLANT
SUT VIC AB 3-0 FS2 27 (SUTURE) ×3 IMPLANT
SYR CONTROL 10ML LL (SYRINGE) IMPLANT
TOWEL OR 17X26 10 PK STRL BLUE (TOWEL DISPOSABLE) ×4 IMPLANT
TRAY FOLEY MTR SLVR 14FR STAT (SET/KITS/TRAYS/PACK) ×2 IMPLANT
TUBE CONNECTING 12'X1/4 (SUCTIONS)
TUBE CONNECTING 12X1/4 (SUCTIONS) IMPLANT
UNDERPAD 30X30 (UNDERPADS AND DIAPERS) ×3 IMPLANT

## 2018-07-26 NOTE — Anesthesia Postprocedure Evaluation (Signed)
Anesthesia Post Note  Patient: Heather Floyd  Procedure(s) Performed: OPEN REDUCTION INTERNAL FIXATION (ORIF) ELBOW/OLECRANON FRACTURE (Left Elbow)     Patient location during evaluation: PACU Anesthesia Type: General Level of consciousness: awake and alert Pain management: pain level controlled Vital Signs Assessment: post-procedure vital signs reviewed and stable Respiratory status: spontaneous breathing, nonlabored ventilation, respiratory function stable and patient connected to nasal cannula oxygen Cardiovascular status: blood pressure returned to baseline and stable Postop Assessment: no apparent nausea or vomiting Anesthetic complications: no    Last Vitals:  Vitals:   07/26/18 1845 07/26/18 1900  BP: (!) 161/75 (!) 155/69  Pulse: (!) 115 (!) 117  Resp: 12 (!) 26  Temp:    SpO2: 97% 95%    Last Pain:  Vitals:   07/26/18 1900  TempSrc:   PainSc: Asleep                 Somara Frymire COKER

## 2018-07-26 NOTE — Progress Notes (Signed)
Received report from Brett Canales, RN in PACU.

## 2018-07-26 NOTE — Anesthesia Procedure Notes (Signed)
Procedure Name: Intubation Date/Time: 07/26/2018 3:04 PM Performed by: Julian Reil, CRNA Pre-anesthesia Checklist: Patient identified, Emergency Drugs available, Suction available and Patient being monitored Patient Re-evaluated:Patient Re-evaluated prior to induction Oxygen Delivery Method: Circle system utilized Preoxygenation: Pre-oxygenation with 100% oxygen Induction Type: IV induction Ventilation: Mask ventilation without difficulty and Oral airway inserted - appropriate to patient size Laryngoscope Size: Hyacinth Meeker and 3 Grade View: Grade I Tube type: Oral Tube size: 6.5 mm Number of attempts: 1 Airway Equipment and Method: Stylet Placement Confirmation: ETT inserted through vocal cords under direct vision,  positive ETCO2 and breath sounds checked- equal and bilateral Secured at: 21 cm Tube secured with: Tape Dental Injury: Teeth and Oropharynx as per pre-operative assessment

## 2018-07-26 NOTE — Progress Notes (Signed)
Pre-op report called and given to [facility], RN in [employer]. All questions answered to satisfaction. OR personnel transported pt off the unit via bed.

## 2018-07-26 NOTE — Transfer of Care (Signed)
Immediate Anesthesia Transfer of Care Note  Patient: Heather Floyd  Procedure(s) Performed: OPEN REDUCTION INTERNAL FIXATION (ORIF) ELBOW/OLECRANON FRACTURE (Left Elbow)  Patient Location: PACU  Anesthesia Type:General  Level of Consciousness: awake and patient cooperative  Airway & Oxygen Therapy: Patient Spontanous Breathing and Patient connected to face mask oxygen  Post-op Assessment: Report given to RN, Post -op Vital signs reviewed and stable and Patient moving all extremities X 4  Post vital signs: Reviewed and stable  Last Vitals:  Vitals Value Taken Time  BP 161/98 07/26/2018  5:00 PM  Temp    Pulse 108 07/26/2018  5:02 PM  Resp 20 07/26/2018  5:02 PM  SpO2 94 % 07/26/2018  5:02 PM  Vitals shown include unvalidated device data.  Last Pain:  Vitals:   07/26/18 0704  TempSrc:   PainSc: Asleep         Complications: No apparent anesthesia complications

## 2018-07-26 NOTE — Anesthesia Preprocedure Evaluation (Signed)
Anesthesia Evaluation  Patient identified by MRN, date of birth, ID band Patient awake    Reviewed: Allergy & Precautions, NPO status , Patient's Chart, lab work & pertinent test results  Airway Mallampati: II  TM Distance: >3 FB Neck ROM: Full    Dental  (+) Edentulous Upper, Edentulous Lower   Pulmonary former smoker,    breath sounds clear to auscultation       Cardiovascular hypertension,  Rhythm:Regular Rate:Normal     Neuro/Psych    GI/Hepatic   Endo/Other    Renal/GU      Musculoskeletal   Abdominal   Peds  Hematology   Anesthesia Other Findings   Reproductive/Obstetrics                             Anesthesia Physical Anesthesia Plan  ASA: III  Anesthesia Plan:    Post-op Pain Management:    Induction: Intravenous  PONV Risk Score and Plan:   Airway Management Planned: Oral ETT  Additional Equipment:   Intra-op Plan:   Post-operative Plan:   Informed Consent: I have reviewed the patients History and Physical, chart, labs and discussed the procedure including the risks, benefits and alternatives for the proposed anesthesia with the patient or authorized representative who has indicated his/her understanding and acceptance.       Plan Discussed with: CRNA and Anesthesiologist  Anesthesia Plan Comments:         Anesthesia Quick Evaluation

## 2018-07-26 NOTE — H&P (Signed)
H&P update  The surgical history has been reviewed and remains accurate without interval change.  The patient was re-examined and patient's physiologic condition has not changed significantly in the last 30 days. The condition still exists that makes this procedure necessary. The treatment plan remains the same, without new options for care.  No new pharmacological allergies or types of therapy has been initiated that would change the plan or the appropriateness of the plan.  The patient and/or family understand the potential benefits and risks.  [name], MD 07/26/2018 7:08 AM

## 2018-07-26 NOTE — Discharge Instructions (Signed)
Postoperative instructions: ° °Weightbearing instructions: non weight bearing ° °Dressing instructions: Keep your dressing and/or splint clean and dry at all times.  It will be removed at your first post-operative appointment.  Your stitches and/or staples will be removed at this visit. ° °Incision instructions:  Do not soak your incision for 3 weeks after surgery.  If the incision gets wet, pat dry and do not scrub the incision. ° °Pain control:  You have been given a prescription to be taken as directed for post-operative pain control.  In addition, elevate the operative extremity above the heart at all times to prevent swelling and throbbing pain. ° °Take over-the-counter Colace, 100mg by mouth twice a day while taking narcotic pain medications to help prevent constipation. ° °Follow up appointments: °1) 12-14 days for suture removal and wound check. °2) Dr. Muskaan Smet as scheduled. ° ° ------------------------------------------------------------------------------------------------------------- ° °After Surgery Pain Control: ° °After your surgery, post-surgical discomfort or pain is likely. This discomfort can last several days to a few weeks. At certain times of the day your discomfort may be more intense.  °Did you receive a nerve block?  °A nerve block can provide pain relief for one hour to two days after your surgery. As long as the nerve block is working, you will experience little or no sensation in the area the surgeon operated on.  °As the nerve block wears off, you will begin to experience pain or discomfort. It is very important that you begin taking your prescribed pain medication before the nerve block fully wears off. Treating your pain at the first sign of the block wearing off will ensure your pain is better controlled and more tolerable when full-sensation returns. Do not wait until the pain is intolerable, as the medicine will be less effective. It is better to treat pain in advance than to try and  catch up.  °General Anesthesia:  °If you did not receive a nerve block during your surgery, you will need to start taking your pain medication shortly after your surgery and should continue to do so as prescribed by your surgeon.  °Pain Medication:  °Most commonly we prescribe Vicodin and Percocet for post-operative pain. Both of these medications contain a combination of acetaminophen (Tylenol®) and a narcotic to help control pain.  °· It takes between 30 and 45 minutes before pain medication starts to work. It is important to take your medication before your pain level gets too intense.  °· Nausea is a common side effect of many pain medications. You will want to eat something before taking your pain medicine to help prevent nausea.  °· If you are taking a prescription pain medication that contains acetaminophen, we recommend that you do not take additional over the counter acetaminophen (Tylenol®).  °Other pain relieving options:  °· Using a cold pack to ice the affected area a few times a day (15 to 20 minutes at a time) can help to relieve pain, reduce swelling and bruising.  °· Elevation of the affected area can also help to reduce pain and swelling. ° ° °

## 2018-07-26 NOTE — Plan of Care (Signed)
  Problem: Activity: Goal: Ability to ambulate and perform ADLs will improve Outcome: Progressing   Problem: Pain Management: Goal: Pain level will decrease Outcome: Progressing   

## 2018-07-26 NOTE — Progress Notes (Signed)
PROGRESS NOTE    Heather Floyd  ZOX:[id number] DOB: 02/07/1933 DOA: 07/23/2018 PCP: [name], Charles Alan, MD  Outpatient Specialists:   Brief Narrative: Patient is an 85 year old Caucasian female, past medical history significant for dementia, TIA, restless leg syndrome, hypertension, hyperlipidemia, carotid artery occlusion and abnormal gait.  Patient was admitted following recurrent falls.  Imaging work-up done revealed left elbow (displaced left olecranon) fracture and left inferior pubic ramus fracture.  Patient also has acute kidney injury and likely UTI.  The plan is to proceed with open reduction and internal fixation of the left elbow fracture.  Pubic ramus fracture will be managed conservatively.  Patient was seen alongside patient's care.  Patient could not or would not give any significant history due to dementing illness.  07/25/2018: Patient seen.  Patient looks a lot better today.  Urine culture is growing E. coli.  Volume depletion is resolving.  AKI has resolved significantly.  For surgery (ORIF of left elbow fracture) tomorrow.  07/26/2018: 4 left elbow surgery today.  Urine culture grew pansensitive E. coli.  No new changes.  Seen alongside patient's 2 daughters.  Assessment & Plan:   Principal Problem:   Closed olecranon fracture, left, initial encounter Active Problems:   Memory disturbance   Hyperlipidemia   Hypertension   RLS (restless legs syndrome)   TIA (transient ischemic attack)   Dementia (HCC)   Dehydration   Fall   Acute lower UTI   Closed fracture of left inferior pubic ramus (HCC)   Pubic ramus fracture (HCC)   Rupture of left triceps tendon   Multiple falls over the last 24 hours with resultant left elbow fracture and left inferior pubic ramus fracture:  Splint applied by EDP over the left upper extremity,  Orthopedics consulted  For ORIF of left elbow fracture.   Inferior pubic ramus fracture being managed conservatively.   07/25/2018: For ORIF left  elbow fracture tomorrow. 07/26/2018: For surgery today.  AKI: Probably related to volume depletion. Continue IV fluids. Renal panel in the morning. Further work-up if serum creatinine remained significantly elevated. 07/25/2018: Resolved significantly.  Likely prerenal.  Likely UTI:  Follow urine cultures.   Start IV Rocephin.   Urine cultures may not grow any organism as patient has been on antibiotics for UTI recently (as per collateral informant).   07/25/2018: Urine culture is growing E. coli.  Continue IV Rocephin for now. 07/26/2018: Urine culture grew pansensitive E. coli.  Complete course of antibiotics.  Hypertension: Well controlled.   Hypoxia on arrival, but she denies any cough, fever or sob.  On 3 lit of [place] oxygen and CXR shows bronchitis changes.  Influenza PCR is negative.  Monitor.   Dementia:  Stable. No agitation seen.   DVT prophylaxis: Lovenox.  Code Status: DNR Family Communication:  Carer at bedside Disposition Plan:  Depends on hospital course..  Consults called:  Dr Roda ShuttersXu with [facility].   Procedures:   ORIF of left elbow on Monday, 07/26/2018  Antimicrobials:   Continue IV Rocephin   Subjective: No significant history from patient.  Objective: Vitals:   07/24/18 2001 07/25/18 0351 07/25/18 1407 07/26/18 0602  BP: 136/90 135/70 124/66 104/69  Pulse: (!) 122 (!) 116 93   Resp: 15 15 16    Temp:  99.3 F (37.4 C) 98.5 F (36.9 C) 99.5 F (37.5 C)  TempSrc:  Axillary Oral Oral  SpO2: 91% (!) 85% 98% 90%  Weight:        Intake/Output Summary (Last 24 hours)  at 07/26/2018 1655 Last data filed at 07/26/2018 1616 Gross per 24 hour  Intake 120 ml  Output 325 ml  Net -205 ml   Filed Weights   07/24/18 1800  Weight: 49 kg    Examination:  General exam: Appears calm and comfortable. Respiratory system: Clear to auscultation.  Cardiovascular system: S1 & S2 heard. Gastrointestinal system: Abdomen is nondistended, soft  and nontender. No organomegaly or masses felt. Normal bowel sounds heard. Central nervous system: Awake and alert.   Extremities: No leg edema.  Data Reviewed: I have personally reviewed following labs and imaging studies  CBC: Recent Labs  Lab 07/23/18 1205 07/25/18 0420  WBC 12.3* 9.8  NEUTROABS 11.0* 7.7  HGB 12.3 11.4*  HCT 37.2 32.7*  MCV 91.4 88.1  PLT 188 180   Basic Metabolic Panel: Recent Labs  Lab 07/23/18 1205 07/25/18 0420  NA 138 139  K 3.8 3.6  CL 103 107  CO2 24 22  GLUCOSE 133* 110*  BUN 37* 17  CREATININE 1.75* 1.19*  CALCIUM 9.0 8.1*  PHOS  --  2.0*   GFR: Estimated Creatinine Clearance: 27.7 mL/min (A) (by C-G formula based on SCr of 1.19 mg/dL (H)). Liver Function Tests: Recent Labs  Lab 07/23/18 1205 07/25/18 0420  AST 39  --   ALT 24  --   ALKPHOS 51  --   BILITOT 0.6  --   PROT 7.1  --   ALBUMIN 4.2 3.0*   No results for input(s): LIPASE, AMYLASE in the last 168 hours. No results for input(s): AMMONIA in the last 168 hours. Coagulation Profile: No results for input(s): INR, PROTIME in the last 168 hours. Cardiac Enzymes: No results for input(s): CKTOTAL, CKMB, CKMBINDEX, TROPONINI in the last 168 hours. BNP (last 3 results) No results for input(s): PROBNP in the last 8760 hours. HbA1C: No results for input(s): HGBA1C in the last 72 hours. CBG: No results for input(s): GLUCAP in the last 168 hours. Lipid Profile: No results for input(s): CHOL, HDL, LDLCALC, TRIG, CHOLHDL, LDLDIRECT in the last 72 hours. Thyroid Function Tests: No results for input(s): TSH, T4TOTAL, FREET4, T3FREE, THYROIDAB in the last 72 hours. Anemia Panel: No results for input(s): VITAMINB12, FOLATE, FERRITIN, TIBC, IRON, RETICCTPCT in the last 72 hours. Urine analysis:    Component Value Date/Time   COLORURINE YELLOW 07/23/2018 1640   APPEARANCEUR HAZY (A) 07/23/2018 1640   LABSPEC 1.020 07/23/2018 1640   PHURINE 5.0 07/23/2018 1640   GLUCOSEU NEGATIVE  07/23/2018 1640   HGBUR NEGATIVE 07/23/2018 1640   BILIRUBINUR NEGATIVE 07/23/2018 1640   KETONESUR 5 (A) 07/23/2018 1640   PROTEINUR NEGATIVE 07/23/2018 1640   UROBILINOGEN 1.0 09/30/2014 1211   NITRITE POSITIVE (A) 07/23/2018 1640   LEUKOCYTESUR NEGATIVE 07/23/2018 1640   Sepsis Labs: @LABRCNTIP (procalcitonin:4,lacticidven:4)  ) Recent Results (from the past 240 hour(s))  Urine culture     Status: Abnormal   Collection Time: 07/23/18  4:40 PM  Result Value Ref Range Status   Specimen Description URINE, CATHETERIZED  Final   Special Requests   Final    Normal Performed at [facility], [address] [place], [place] [address]    Culture >=100,000 COLONIES/mL ESCHERICHIA COLI (A)  Final   Report Status 07/26/2018 FINAL  Final   Organism ID, Bacteria ESCHERICHIA COLI (A)  Final      Susceptibility   Escherichia coli - MIC*    AMPICILLIN <=2 SENSITIVE Sensitive     CEFAZOLIN <=4 SENSITIVE Sensitive  CEFTRIAXONE <=1 SENSITIVE Sensitive     CIPROFLOXACIN <=0.25 SENSITIVE Sensitive     GENTAMICIN <=1 SENSITIVE Sensitive     IMIPENEM <=0.25 SENSITIVE Sensitive     NITROFURANTOIN <=16 SENSITIVE Sensitive     TRIMETH/SULFA <=20 SENSITIVE Sensitive     AMPICILLIN/SULBACTAM <=2 SENSITIVE Sensitive     PIP/TAZO <=4 SENSITIVE Sensitive     Extended ESBL NEGATIVE Sensitive     * >=100,000 COLONIES/mL ESCHERICHIA COLI  Surgical pcr screen     Status: None   Collection Time: 07/26/18  1:33 PM  Result Value Ref Range Status   MRSA, PCR NEGATIVE NEGATIVE Final   Staphylococcus aureus NEGATIVE NEGATIVE Final    Comment: (NOTE) The Xpert SA Assay (FDA approved for NASAL specimens in patients 74 years of age and older), is one component of a comprehensive surveillance program. It is not intended to diagnose infection nor to guide or monitor treatment. Performed at [facility], [address] [place], [place] [address]          Radiology  Studies: No results found.      Scheduled Meds: . [MAR Hold] feeding supplement (ENSURE ENLIVE)  237 mL Oral TID BM  . [MAR Hold] gabapentin  100 mg Oral TID  . [MAR Hold] QUEtiapine  50 mg Oral QHS  . [MAR Hold] sertraline  100 mg Oral Daily  . [MAR Hold] traZODone  75 mg Oral QHS   Continuous Infusions: . sodium chloride 75 mL/hr at 07/26/18 1232  . [MAR Hold] cefTRIAXone (ROCEPHIN)  IV 1 g (07/25/18 1238)  . lactated ringers 50 mL/hr at 07/26/18 1353     LOS: 2 days    Time spent: 25 minutes.   Berton Mount, MD  [facility] Pager #: [phone] 7PM-7AM contact night coverage as above

## 2018-07-26 NOTE — Plan of Care (Signed)
  Problem: Education: Goal: Verbalization of understanding the information provided (i.e., activity precautions, restrictions, etc) will improve Outcome: Progressing   Problem: Self-Concept: Goal: Ability to maintain and perform role responsibilities to the fullest extent possible will improve Outcome: Progressing   Problem: Pain Management: Goal: Pain level will decrease Outcome: Progressing   

## 2018-07-26 NOTE — Op Note (Signed)
   Date of Surgery: 07/26/2018  INDICATIONS: Heather Floyd is a 85 y.o.-year-old female with a left olecranon fracture;  The family did consent to the procedure after discussion of the risks and benefits.  PREOPERATIVE DIAGNOSIS:  1. Displaced comminuted left olecranon fracture 2. Partial tear of triceps tendon   POSTOPERATIVE DIAGNOSIS: Same.  PROCEDURE:  1. Open reduction internal fixation of left olecranon fracture 2. Repair of triceps tendon  SURGEON: N. Glee Arvin, M.D.  ASSIST: Starlyn [place], [place]; necessary for the timely completion of procedure and due to complexity of procedure.  ANESTHESIA:  general, local  IV FLUIDS AND URINE: See anesthesia.  ESTIMATED BLOOD LOSS: minimal mL.  IMPLANTS: 2.9 mm quattro link x 2 anchors  DRAINS: none  COMPLICATIONS: None.  DESCRIPTION OF PROCEDURE: The patient was brought to the operating room and placed supine on the operating table.  The patient had been signed prior to the procedure and this was documented. The patient had the anesthesia placed by the anesthesiologist.  A time-out was performed to confirm that this was the correct patient, site, side and location. The patient did receive antibiotics prior to the incision and was re-dosed during the procedure as needed at indicated intervals.  A tourniquet was placed.  The patient had the operative extremity prepped and draped in the standard surgical fashion.    An incision was made directly posterior to the elbow.  Full-thickness flaps were raised.  Subperiosteal elevation was performed.  The olecranon fracture turned out to be slightly more comminuted than what the x-rays displayed.  There was part of the triceps tendon that had avulsed off of the olecranon.  I first attempted to fix the fracture by using a olecranon plate but given the fact that the bone quality was severely osteoporotic and comminuted this was not going to be successful therefore I decided to use a #2 max braid  suture in order to pull the piece of the olecranon back to the proximal ulna.  I used a Mason-Allen stitch into the portion of the olecranon that was still attached to the triceps and the suture was then brought down to the proximal ulna.  Unfortunately I was not able to achieve anatomic reduction but I was able to approximate the main olecranon fragment back to the proximal ulna.  A single 2.9 mm Quatro link anchor was placed in the proximal ulna.  I then used a second #2 max braid suture for the triceps tendon repair.  A Krakw stitch was placed in the triceps tendon and this was then brought down to the proximal ulna using another 2.9 mm Quatro link anchor.  The tension was appropriately set.  X-rays confirmed approximation of the olecranon fragment.  The elbow joint was concentrically reduced.  There was no instability.  The surgical wound was then thoroughly irrigated and closed in layered fashion using 2-0 Vicryl and 4-0 nylon.  Sterile dressings were applied.  The arm was placed in a long-arm splint at 90 degrees.  Patient tolerated the procedure well and had no immediate complications.  POSTOPERATIVE PLAN: Patient will remain in a splint for 2 weeks to protect the repair.  She will be nonweightbearing.  [name], MD [facility] [phone] 4:29 PM

## 2018-07-27 LAB — CBC WITH DIFFERENTIAL/PLATELET
Abs Immature Granulocytes: 0.05 10*3/uL (ref 0.00–0.07)
Basophils Absolute: 0 10*3/uL (ref 0.0–0.1)
Basophils Relative: 0 %
Eosinophils Absolute: 0 10*3/uL (ref 0.0–0.5)
Eosinophils Relative: 0 %
HCT: 30.9 % — ABNORMAL LOW (ref 36.0–46.0)
Hemoglobin: 10.4 g/dL — ABNORMAL LOW (ref 12.0–15.0)
Immature Granulocytes: 1 %
Lymphocytes Relative: 5 %
Lymphs Abs: 0.4 10*3/uL — ABNORMAL LOW (ref 0.7–4.0)
MCH: 29.1 pg (ref 26.0–34.0)
MCHC: 33.7 g/dL (ref 30.0–36.0)
MCV: 86.3 fL (ref 80.0–100.0)
Monocytes Absolute: 0.5 10*3/uL (ref 0.1–1.0)
Monocytes Relative: 8 %
Neutro Abs: 5.7 10*3/uL (ref 1.7–7.7)
Neutrophils Relative %: 86 %
Platelets: 216 10*3/uL (ref 150–400)
RBC: 3.58 MIL/uL — ABNORMAL LOW (ref 3.87–5.11)
RDW: 12.5 % (ref 11.5–15.5)
WBC: 6.7 10*3/uL (ref 4.0–10.5)
nRBC: 0 % (ref 0.0–0.2)

## 2018-07-27 LAB — RENAL FUNCTION PANEL
Albumin: 2.8 g/dL — ABNORMAL LOW (ref 3.5–5.0)
Anion gap: 13 (ref 5–15)
BUN: 13 mg/dL (ref 8–23)
CO2: 25 mmol/L (ref 22–32)
Calcium: 8.4 mg/dL — ABNORMAL LOW (ref 8.9–10.3)
Chloride: 104 mmol/L (ref 98–111)
Creatinine, Ser: 1.02 mg/dL — ABNORMAL HIGH (ref 0.44–1.00)
GFR calc Af Amer: 59 mL/min — ABNORMAL LOW (ref >60)
GFR calc non Af Amer: 51 mL/min — ABNORMAL LOW (ref >60)
Glucose, Bld: 125 mg/dL — ABNORMAL HIGH (ref 70–99)
Phosphorus: 3 mg/dL (ref 2.5–4.6)
Potassium: 3 mmol/L — ABNORMAL LOW (ref 3.5–5.1)
Sodium: 142 mmol/L (ref 135–145)

## 2018-07-27 LAB — MAGNESIUM: Magnesium: 1.9 mg/dL (ref 1.7–2.4)

## 2018-07-27 MED ORDER — POTASSIUM CHLORIDE CRYS ER 20 MEQ PO TBCR: 40.0000 meq | EXTENDED_RELEASE_TABLET | ORAL | Status: DC

## 2018-07-27 MED ORDER — POTASSIUM CHLORIDE 20 MEQ PO PACK
40.0000 meq | PACK | Freq: Two times a day (BID) | ORAL | Status: AC
  Administered 2018-07-27 – 2018-07-28 (×3): 40 meq via ORAL
  Filled 2018-07-27 (×3): qty 2

## 2018-07-27 NOTE — Progress Notes (Signed)
Physical Therapy Treatment Patient Details Name: Heather Floyd MRN: [id number] DOB: Apr 05, 1933 Today's Date: 07/27/2018    History of Present Illness 85 y.o. female admitted on 07/23/18 due to repeated falls and inability to walk, possible UTI.  Pt dx with L inferior pubic rami fx and L elbow displaced intra articular olecranon fx. ORIF L olecranon fx with repair of triceps tear 07/26/18.  Pt with significant PMH of TIA, RLS, memory disturbance, HTN, concussion, carotid artery occlusion, and L THA (anterior hemi hip).    PT Comments    Patient progressing to OOB this session, though still rather confused and agitated.  Daughter present to assist with calming and occupying her to avoid pulling out foley or taking of dressing on elbow.  Feel she remains appropriate for SNF level rehab.  Will need +2 A for back to bed, RN aware.    Follow Up Recommendations  SNF     Equipment Recommendations  Hospital bed;Wheelchair (measurements PT);Wheelchair cushion (measurements PT)    Recommendations for Other Services       Precautions / Restrictions Precautions Precautions: Fall Required Braces or Orthoses: Sling(for comfort) Restrictions Weight Bearing Restrictions: Yes LUE Weight Bearing: Non weight bearing LLE Weight Bearing: Weight bearing as tolerated    Mobility  Bed Mobility Overal bed mobility: Needs Assistance Bed Mobility: Supine to Sit     Supine to sit: +2 for physical assistance;Max assist     General bed mobility comments: resistive at times; reaching for therapist's arm; apparently due to fear and pain  Transfers Overall transfer level: Needs assistance   Transfers: Sit to/from Stand;Stand Pivot Transfers Sit to Stand: Mod assist;+2 physical assistance Stand pivot transfers: Max assist;+2 physical assistance       General transfer comment: flexed posture after assisted in to standing, antalgia on L LE with attempted gait and L knee  buckling  Ambulation/Gait Ambulation/Gait assistance: Max assist;+2 physical assistance Gait Distance (Feet): 2 Feet Assistive device: 2 person hand held assist Gait Pattern/deviations: Step-to pattern;Shuffle;Trunk flexed;Antalgic     General Gait Details: took about 4 steps toward door and daughter brought chair up after pt not safe with buckling L LE due to pain   Stairs             Wheelchair Mobility    Modified Rankin (Stroke Patients Only)       Balance Overall balance assessment: History of Falls;Needs assistance   Sitting balance-Leahy Scale: Poor Sitting balance - Comments: heavily leaning back     Standing balance-Leahy Scale: Poor Standing balance comment: +2 A to take some steps                            Cognition Arousal/Alertness: Awake/alert Behavior During Therapy: Restless Overall Cognitive Status: History of cognitive impairments - at baseline                                 General Comments: Daughter states pt is more restless and agitated than her baseline; states she was pulling everything off lst light      Exercises General Exercises - Upper Extremity Digit Composite Flexion: Left;10 reps;AAROM;Supine(as tolerated)    General Comments General comments (skin integrity, edema, etc.): daughter in room and pt agitated wanting to go sleep in her mother and daddy's room downt the hall; once in chair able to push in recliner in hallway to allow her  to see more of her environment, then requested back to the room and left with daughter assisting her to eat after taking pain medicaiton      Pertinent Vitals/Pain Pain Assessment: Faces Faces Pain Scale: Hurts even more Pain Location: back, and Lt UE  Pain Descriptors / Indicators: Grimacing;Guarding Pain Intervention(s): Repositioned;RN gave pain meds during session    Home Living Family/patient expects to be discharged to:: Skilled nursing facility                Additional Comments: Pt is from memory care ALF, but daughter is aware she will likely need SNF placement.     Prior Function Level of Independence: Needs assistance  Gait / Transfers Assistance Needed: Ambulates without assistive devices and has been non ambulatory since her multiple falls PTA (so less than a week).  ADL's / Homemaking Assistance Needed: Caregiver reports pt was able to independently toilet self, but later states pt wore a depends.  She reports pt was able to dress self with direct cues and supervision, and bathed self with step by step instruction  Comments: Daughter home with pt at night. Private personal care attendant 8 hours during the day.    PT Goals (current goals can now be found in the care plan section) Acute Rehab PT Goals Patient Stated Goal: daughter wants her mother to go to rehab to get back on her feet and walking again Progress towards PT goals: Progressing toward goals    Frequency    Min 3X/week      PT Plan Current plan remains appropriate    Co-evaluation PT/OT/SLP Co-Evaluation/Treatment: Yes Reason for Co-Treatment: Complexity of the patient's impairments (multi-system involvement);To address functional/ADL transfers;Necessary to address cognition/behavior during functional activity PT goals addressed during session: Balance;Mobility/safety with mobility OT goals addressed during session: ADL's and self-care      AM-PAC PT "6 Clicks" Mobility   Outcome Measure  Help needed turning from your back to your side while in a flat bed without using bedrails?: Total Help needed moving from lying on your back to sitting on the side of a flat bed without using bedrails?: Total Help needed moving to and from a bed to a chair (including a wheelchair)?: Total Help needed standing up from a chair using your arms (e.g., wheelchair or bedside chair)?: Total Help needed to walk in hospital room?: Total   6 Click Score: 5    End of Session    Activity Tolerance: Patient limited by pain;Treatment limited secondary to agitation Patient left: in chair;with family/visitor present;with call bell/phone within reach   PT Visit Diagnosis: Muscle weakness (generalized) (M62.81);Difficulty in walking, not elsewhere classified (R26.2);Pain Pain - Right/Left: Left Pain - part of body: Arm     Time: 2257-5051 PT Time Calculation (min) (ACUTE ONLY): 19 min  Charges:  $Therapeutic Activity: 8-22 mins                     Magda Kiel, [place] [facility] [phone] 07/27/2018    Reginia Naas 07/27/2018, 11:01 AM

## 2018-07-27 NOTE — Progress Notes (Signed)
Occupational Therapy Treatment Patient Details Name: Heather Floyd MRN: [id number] DOB: Apr 24, 1933 Today's Date: 07/27/2018    History of present illness 85 y.o. female admitted on 07/23/18 due to repeated falls and inability to walk, possible UTI.  Pt dx with L inferior pubic rami fx and L elbow displaced intra articular olecranon fx. ORIF L olecranon fx with repair of triceps tear 07/26/18.  Pt with significant PMH of TIA, RLS, memory disturbance, HTN, concussion, carotid artery occlusion, and L THA (anterior hemi hip).   OT comments  Pt mobilized to chair with +2 Max A. Difficulty weight bearing through LLE. Able to redirect pt for brief periods at time and participate with self feeding and washing her face. Pt with increased restlessness as pain increases. Nsg notified of mobility status and need to have +2 A for transfer. Pt will need sitter if family not available. May try use of "busy blanket". Recommend measures to reduce hospital delirium. Discussed progress and continued recommendation for SNF with pt's daughter. Continue to recommend rehab at [facility]. Will continue to follow.   Follow Up Recommendations  SNF    Equipment Recommendations  None recommended by OT    Recommendations for Other Services      Precautions / Restrictions Precautions Precautions: Fall Required Braces or Orthoses: Sling(for comfort) Restrictions Weight Bearing Restrictions: Yes LUE Weight Bearing: Non weight bearing LLE Weight Bearing: Weight bearing as tolerated       Mobility Bed Mobility Overal bed mobility: Needs Assistance Bed Mobility: Supine to Sit     Supine to sit: +2 for physical assistance;Max assist     General bed mobility comments: resitvie at times; reaching for therapist's arm; apparently due to fear and pain  Transfers Overall transfer level: Needs assistance   Transfers: Sit to/from Stand;Stand Pivot Transfers Sit to Stand: Mod assist;+2 physical assistance Stand pivot  transfers: Max assist;+2 physical assistance       General transfer comment: Pt powering up but unable to achieve full upright posture; Pt buckling LLE due to apparent pain; Able to take a few shufflinf steps with +2 Max A    Balance Overall balance assessment: History of Falls(3 falls in 24 hr period)                                         ADL either performed or assessed with clinical judgement   ADL Overall ADL's : Needs assistance/impaired Eating/Feeding: Bed level;Moderate assistance   Grooming: Wash/dry face;Minimal assistance;Bed level   Upper Body Bathing: Bed level;Maximal assistance   Lower Body Bathing: Total assistance;Bed level   Upper Body Dressing : Bed level;Maximal assistance   Lower Body Dressing: Total assistance;Bed level   Toilet Transfer: Maximal assistance;+2 for physical assistance Toilet Transfer Details (indicate cue type and reason): simulated         Functional mobility during ADLs: +2 for physical assistance;Maximal assistance General ADL Comments: Difficulty sustaining attention to task     Vision       Perception     Praxis      Cognition Arousal/Alertness: Awake/alert Behavior During Therapy: Restless Overall Cognitive Status: History of cognitive impairments - at baseline                                 General Comments: Daughter states pt is more restless and agitated  than her baseline; states she was pulling everything off lst light        Exercises General Exercises - Upper Extremity Digit Composite Flexion: Left;10 reps;AAROM;Supine(as tolerated)   Shoulder Instructions       General Comments      Pertinent Vitals/ Pain       Pain Assessment: Faces Faces Pain Scale: Hurts even more Pain Location: back, and Lt UE  Pain Descriptors / Indicators: Grimacing;Guarding Pain Intervention(s): Limited activity within patient's tolerance;Repositioned  Home Living Family/patient expects to  be discharged to:: Skilled nursing facility                                 Additional Comments: Pt is from memory care ALF, but daughter is aware she will likely need SNF placement.       Prior Functioning/Environment Level of Independence: Needs assistance  Gait / Transfers Assistance Needed: Ambulates without assistive devices and has been non ambulatory since her multiple falls PTA (so less than a week).  ADL's / Homemaking Assistance Needed: Caregiver reports pt was able to independently toilet self, but later states pt wore a depends.  She reports pt was able to dress self with direct cues and supervision, and bathed self with step by step instruction    Comments: Daughter home with pt at night. Private personal care attendant 8 hours during the day.    Frequency  Min 2X/week        Progress Toward Goals  OT Goals(current goals can now be found in the care plan section)     Acute Rehab OT Goals Patient Stated Goal: daughter wants her mother to go to rehab to get back on her feet and walking again OT Goal Formulation: Patient unable to participate in goal setting Time For Goal Achievement: 08/07/18 Potential to Achieve Goals: Good ADL Goals Pt Will Perform Eating: with min assist;sitting Pt Will Perform Grooming: with min assist;sitting Pt Will Transfer to Toilet: with max assist;stand pivot transfer;bedside commode  Plan Discharge plan remains appropriate    Co-evaluation    PT/OT/SLP Co-Evaluation/Treatment: Yes Reason for Co-Treatment: Complexity of the patient's impairments (multi-system involvement);For patient/therapist safety;To address functional/ADL transfers   OT goals addressed during session: ADL's and self-care      AM-PAC OT "6 Clicks" Daily Activity     Outcome Measure   Help from another person eating meals?: A Lot Help from another person taking care of personal grooming?: A Lot Help from another person toileting, which includes  using toliet, bedpan, or urinal?: Total Help from another person bathing (including washing, rinsing, drying)?: A Lot Help from another person to put on and taking off regular upper body clothing?: Total Help from another person to put on and taking off regular lower body clothing?: Total 6 Click Score: 9    End of Session Equipment Utilized During Treatment: Gait belt  OT Visit Diagnosis: Pain;Muscle weakness (generalized) (M62.81);Cognitive communication deficit (R41.841) Pain - Right/Left: Left Pain - part of body: Arm   Activity Tolerance Patient limited by pain(impaired cognition )   Patient Left with call bell/phone within reach;with family/visitor present;in chair;with chair alarm set   Nurse Communication Mobility status        Time: 7342-8768 OT Time Calculation (min): 36 min  Charges: OT General Charges $OT Visit: 1 Visit OT Treatments $Self Care/Home Management : 8-22 mins  Maurie Boettcher, OT/L   Acute OT Clinical Specialist Acute Rehabilitation Services  Pager [phone] Office [phone]    [place] 07/27/2018, 10:15 AM

## 2018-07-27 NOTE — Care Management Important Message (Signed)
Important Message  Patient Details  Name: Heather Floyd MRN: [id number] Date of Birth: Nov 12, 1932   Medicare Important Message Given:  Yes    Dorena Bodo 07/27/2018, 4:23 PM

## 2018-07-27 NOTE — Progress Notes (Signed)
Subjective: 1 Day Post-Op Procedure(s) (LRB): OPEN REDUCTION INTERNAL FIXATION (ORIF) ELBOW/OLECRANON FRACTURE (Left) Patient reports pain as moderate.  Patient has advanced dementia and has been non-compliant all night with wearing sling.  No reported falls overnight  Objective: Vital signs in last 24 hours: Temp:  [98.1 F (36.7 C)-99.5 F (37.5 C)] 98.1 F (36.7 C) (02/11 0107) Pulse Rate:  [49-120] 80 (02/11 0107) Resp:  [12-26] 18 (02/11 0107) BP: (149-161)/(69-98) 150/80 (02/11 0107) SpO2:  [91 %-97 %] 93 % (02/11 0107)  Intake/Output from previous day: 02/10 0701 - 02/11 0700 In: 2256.5 [I.V.:2056.5; IV Piggyback:200] Out: 2425 [Urine:2425] Intake/Output this shift: No intake/output data recorded.  Recent Labs    07/25/18 0420  HGB 11.4*   Recent Labs    07/25/18 0420  WBC 9.8  RBC 3.71*  HCT 32.7*  PLT 180   Recent Labs    07/25/18 0420  NA 139  K 3.6  CL 107  CO2 22  BUN 17  CREATININE 1.19*  GLUCOSE 110*  CALCIUM 8.1*   No results for input(s): LABPT, INR in the last 72 hours.  Neurovascular intact Sensation intact distally  Sling has been removed as it was found multiple times wrapped around her neck.   Splint is well fitting and still in place LUE- wwp    Assessment/Plan: 1 Day Post-Op Procedure(s) (LRB): OPEN REDUCTION INTERNAL FIXATION (ORIF) ELBOW/OLECRANON FRACTURE (Left)  Ok to d/c sling as it sounds more like a hazard Continue with the splint at all times NWB LUE Elevate for swelling if possible F/u with Dr. Roda Shutters 2 weeks post-op for suture removal       Cristie Hem 07/27/2018, 7:40 AM

## 2018-07-27 NOTE — Plan of Care (Signed)
  Problem: Activity: Goal: Ability to ambulate and perform ADLs will improve Outcome: Progressing   Problem: Clinical Measurements: Goal: Postoperative complications will be avoided or minimized Outcome: Progressing   Problem: Pain Management: Goal: Pain level will decrease Outcome: Progressing   

## 2018-07-27 NOTE — NC FL2 (Signed)
[place] MEDICAID FL2 LEVEL OF CARE SCREENING TOOL     IDENTIFICATION  Patient Name: Heather Floyd Birthdate: [birth year] Sex: female Admission Date (Current Location): 07/23/2018  [facility] and [place] Number:  Producer, television/film/video and Address:  [facility], [address], [place], [place] [address]      Provider Number: [id number]  Attending Physician Name and Address:  Barnetta Chapel, MD  Relative Name and Phone Number:  [name] (daughter) [phone]    Current Level of Care: Hospital Recommended Level of Care: Skilled Nursing Facility Prior Approval Number:    Date Approved/Denied: 08/14/14 PASRR Number: [id number]  Discharge Plan: SNF    Current Diagnoses: Patient Active Problem List   Diagnosis Date Noted  . Rupture of left triceps tendon 07/26/2018  . Closed olecranon fracture, left, initial encounter 07/24/2018  . Pubic ramus fracture (HCC) 07/24/2018  . Closed fracture of left inferior pubic ramus (HCC)   . Fall 07/23/2018  . Acute lower UTI 07/23/2018  . Palliative care encounter   . Colitis 08/25/2017  . Abdominal pain 04/08/2017  . Leukocytosis 04/08/2017  . Dementia (HCC) 04/08/2017  . Dehydration 04/08/2017  . Abnormal urinalysis 04/08/2017  . Enteritis 04/07/2017  . Abnormality of gait 09/08/2014  . Concussion with loss of consciousness 09/08/2014  . Subdural hematoma (HCC) 08/11/2014  . Hypokalemia 08/11/2014  . SDH (subdural hematoma) (HCC) 08/11/2014  . Hyperlipidemia   . Hypertension   . RLS (restless legs syndrome)   . DJD (degenerative joint disease) of cervical spine   . TIA (transient ischemic attack)   . Carotid artery occlusion   . Essential hypertension   . Memory disturbance 03/29/2013  . Near syncope 01/13/2013  . Accelerated hypertension 01/13/2013  . Hyperkalemia 01/13/2013    Orientation RESPIRATION BLADDER Height & Weight     Self  Normal Incontinent, External catheter Weight: 49 kg(Per  caregiver) Height:     BEHAVIORAL SYMPTOMS/MOOD NEUROLOGICAL BOWEL NUTRITION STATUS      Continent Diet(see discharge summary)  AMBULATORY STATUS COMMUNICATION OF NEEDS Skin   Extensive Assist Verbally Other (Comment), Surgical wounds(ecchymosis head, skin tear right knee, right knee, left elbow and left hip closed surgical incisions)                       Personal Care Assistance Level of Assistance  Bathing, Feeding, Dressing, Total care Bathing Assistance: Maximum assistance Feeding assistance: Limited assistance(need assist) Dressing Assistance: Maximum assistance Total Care Assistance: Maximum assistance   Functional Limitations Info  Sight, Hearing, Speech Sight Info: Adequate Hearing Info: Adequate Speech Info: Adequate    SPECIAL CARE FACTORS FREQUENCY  PT (By licensed PT), OT (By licensed OT)     PT Frequency: min 5x weekly OT Frequency: min 5x weekly            Contractures Contractures Info: Not present    Additional Factors Info  Code Status, Allergies Code Status Info: DNR Allergies Info: Aspirin, Codeine           Current Medications (07/27/2018):  This is the current hospital active medication list Current Facility-Administered Medications  Medication Dose Route Frequency Provider Last Rate Last Dose  . 0.9 %  sodium chloride infusion   Intravenous Continuous Tarry Kos, MD 75 mL/hr at 07/27/18 0500    . ceFAZolin (ANCEF) IVPB 2g/100 mL premix  2 g Intravenous Q8H Tarry Kos, MD 200 mL/hr at 07/27/18 0514 2 g at 07/27/18 0514  .  cefTRIAXone (ROCEPHIN) 1 g in sodium chloride 0.9 % 100 mL IVPB  1 g Intravenous Q24H Tarry Kos, MD   Stopped at 07/25/18 1308  . feeding supplement (ENSURE ENLIVE) (ENSURE ENLIVE) liquid 237 mL  237 mL Oral TID BM Tarry Kos, MD   237 mL at 07/26/18 2323  . gabapentin (NEURONTIN) capsule 100 mg  100 mg Oral TID Tarry Kos, MD   100 mg at 07/27/18 1010  . hydrOXYzine (ATARAX/VISTARIL) tablet 25 mg  25 mg  Oral TID PRN Tarry Kos, MD   25 mg at 07/27/18 1019  . lactated ringers infusion   Intravenous Continuous Tarry Kos, MD 50 mL/hr at 07/26/18 1353    . morphine 2 MG/ML injection 1-2 mg  1-2 mg Intravenous Q4H PRN Tarry Kos, MD   2 mg at 07/27/18 6294  . oxyCODONE (Oxy IR/ROXICODONE) immediate release tablet 5 mg  5 mg Oral Q4H PRN Tarry Kos, MD   5 mg at 07/27/18 1018  . potassium chloride SA (K-DUR,KLOR-CON) CR tablet 40 mEq  40 mEq Oral Q4H Berton Mount I, MD      . QUEtiapine (SEROQUEL) tablet 50 mg  50 mg Oral QHS Tarry Kos, MD   50 mg at 07/26/18 2320  . sertraline (ZOLOFT) tablet 100 mg  100 mg Oral Daily Tarry Kos, MD   100 mg at 07/27/18 1018  . traZODone (DESYREL) tablet 75 mg  75 mg Oral QHS Tarry Kos, MD   75 mg at 07/26/18 2319     Discharge Medications: Please see discharge summary for a list of discharge medications.  Relevant Imaging Results:  Relevant Lab Results:   Additional Information SSN: [id number]  Gildardo Griffes, LCSW

## 2018-07-27 NOTE — Progress Notes (Signed)
PROGRESS NOTE    Heather Floyd  VOP:[id number] DOB: 1932-10-25 DOA: 07/23/2018 PCP: Daisy Floro, MD  Outpatient Specialists:   Brief Narrative: Patient is an 85 year old Caucasian female, past medical history significant for dementia, TIA, restless leg syndrome, hypertension, hyperlipidemia, carotid artery occlusion and abnormal gait.  Patient was admitted following recurrent falls.  Imaging work-up done revealed left elbow (displaced left olecranon) fracture and left inferior pubic ramus fracture.  Patient also has acute kidney injury that is likely prerenal and UTI secondary to E. coli.  Patient underwent ORIF of the left elbow/olecranon fracture and repair of tendon yesterday, 07/26/2018.  Pubic ramus fracture will be managed conservatively.  Plan is to discharge patient to skilled nursing facility when patient is more stable.  Hypokalemia is noted.  We will continue to replete and monitor.  Continue IV antibiotics for UTI after tomorrow's dose.  Acute kidney injury has resolved.   Assessment & Plan:   Principal Problem:   Closed olecranon fracture, left, initial encounter Active Problems:   Memory disturbance   Hyperlipidemia   Hypertension   RLS (restless legs syndrome)   TIA (transient ischemic attack)   Dementia (HCC)   Dehydration   Fall   Acute lower UTI   Closed fracture of left inferior pubic ramus (HCC)   Pubic ramus fracture (HCC)   Rupture of left triceps tendon   Multiple falls over the last 24 hours with resultant left elbow fracture and left inferior pubic ramus fracture:  Splint applied by EDP over the left upper extremity,  Orthopedics consulted  For ORIF of left elbow fracture.   Inferior pubic ramus fracture being managed conservatively.   07/26/2018: Patient underwent ORIF left elbow/olecranon fracture and repair of tendon.    AKI: Probably related to volume depletion. Continue IV fluids. Renal panel in the morning. Further work-up if serum  creatinine remained significantly elevated. 07/25/2018: Resolved significantly.  Likely prerenal. 07/27/2018: AKI has resolved.  Likely UTI:  Urine cultures grew E. Coli. Patient is on IV Rocephin. Kindly discontinue IV Rocephin after the dose on 07/28/2018.  Hypokalemia: Continue to replete and monitor. Renal panel and magnesium in a.m.   Hypertension: Continue to optimize.  Hypoxia on arrival, but she denies any cough, fever or sob.  On 3 lit of [place] oxygen and CXR shows bronchitis changes.  Influenza PCR is negative.  Monitor.   Dementia:  Intermittent agitation since surgery.  DVT prophylaxis: Lovenox.  Code Status: DNR Family Communication:  Carer at bedside Disposition Plan:  Depends on hospital course..  Consults called:  Dr Roda Shutters with [facility].   Procedures:   ORIF of left elbow on Monday, 07/26/2018  Antimicrobials:   Continue IV Rocephin (complete dose on 07/28/2018)   Subjective: No significant history from patient.  Objective: Vitals:   07/27/18 1031 07/27/18 1201 07/27/18 1415 07/27/18 1619  BP:  (!) 148/88 126/64 (!) 143/68  Pulse: (!) 103 97 (!) 103 (!) 101  Resp:  18 16 18   Temp:  98.8 F (37.1 C) 98.5 F (36.9 C) 98.6 F (37 C)  TempSrc:  Oral Oral Oral  SpO2:  96% 91% 94%  Weight:        Intake/Output Summary (Last 24 hours) at 07/27/2018 1733 Last data filed at 07/27/2018 1557 Gross per 24 hour  Intake 1376.49 ml  Output 2625 ml  Net -1248.51 ml   Filed Weights   07/24/18 1800  Weight: 49 kg    Examination:  General exam: Appears calm and comfortable.  Respiratory system: Clear to auscultation.  Cardiovascular system: S1 & S2 heard. Gastrointestinal system: Abdomen is nondistended, soft and nontender. No organomegaly or masses felt. Normal bowel sounds heard. Central nervous system: Awake and alert.   Extremities: No leg edema.  Data Reviewed: I have personally reviewed following labs and imaging  studies  CBC: Recent Labs  Lab 07/23/18 1205 07/25/18 0420 07/27/18 0711  WBC 12.3* 9.8 6.7  NEUTROABS 11.0* 7.7 5.7  HGB 12.3 11.4* 10.4*  HCT 37.2 32.7* 30.9*  MCV 91.4 88.1 86.3  PLT 188 180 216   Basic Metabolic Panel: Recent Labs  Lab 07/23/18 1205 07/25/18 0420 07/27/18 0711  NA 138 139 142  K 3.8 3.6 3.0*  CL 103 107 104  CO2 24 22 25   GLUCOSE 133* 110* 125*  BUN 37* 17 13  CREATININE 1.75* 1.19* 1.02*  CALCIUM 9.0 8.1* 8.4*  MG  --   --  1.9  PHOS  --  2.0* 3.0   GFR: Estimated Creatinine Clearance: 32.3 mL/min (A) (by C-G formula based on SCr of 1.02 mg/dL (H)). Liver Function Tests: Recent Labs  Lab 07/23/18 1205 07/25/18 0420 07/27/18 0711  AST 39  --   --   ALT 24  --   --   ALKPHOS 51  --   --   BILITOT 0.6  --   --   PROT 7.1  --   --   ALBUMIN 4.2 3.0* 2.8*   No results for input(s): LIPASE, AMYLASE in the last 168 hours. No results for input(s): AMMONIA in the last 168 hours. Coagulation Profile: No results for input(s): INR, PROTIME in the last 168 hours. Cardiac Enzymes: No results for input(s): CKTOTAL, CKMB, CKMBINDEX, TROPONINI in the last 168 hours. BNP (last 3 results) No results for input(s): PROBNP in the last 8760 hours. HbA1C: No results for input(s): HGBA1C in the last 72 hours. CBG: No results for input(s): GLUCAP in the last 168 hours. Lipid Profile: No results for input(s): CHOL, HDL, LDLCALC, TRIG, CHOLHDL, LDLDIRECT in the last 72 hours. Thyroid Function Tests: No results for input(s): TSH, T4TOTAL, FREET4, T3FREE, THYROIDAB in the last 72 hours. Anemia Panel: No results for input(s): VITAMINB12, FOLATE, FERRITIN, TIBC, IRON, RETICCTPCT in the last 72 hours. Urine analysis:    Component Value Date/Time   COLORURINE YELLOW 07/23/2018 1640   APPEARANCEUR HAZY (A) 07/23/2018 1640   LABSPEC 1.020 07/23/2018 1640   PHURINE 5.0 07/23/2018 1640   GLUCOSEU NEGATIVE 07/23/2018 1640   HGBUR NEGATIVE 07/23/2018 1640    BILIRUBINUR NEGATIVE 07/23/2018 1640   KETONESUR 5 (A) 07/23/2018 1640   PROTEINUR NEGATIVE 07/23/2018 1640   UROBILINOGEN 1.0 09/30/2014 1211   NITRITE POSITIVE (A) 07/23/2018 1640   LEUKOCYTESUR NEGATIVE 07/23/2018 1640   Sepsis Labs: @LABRCNTIP (procalcitonin:4,lacticidven:4)  ) Recent Results (from the past 240 hour(s))  Urine culture     Status: Abnormal   Collection Time: 07/23/18  4:40 PM  Result Value Ref Range Status   Specimen Description URINE, CATHETERIZED  Final   Special Requests   Final    Normal Performed at [facility], [address] [place], [place] [address]    Culture >=100,000 COLONIES/mL ESCHERICHIA COLI (A)  Final   Report Status 07/26/2018 FINAL  Final   Organism ID, Bacteria ESCHERICHIA COLI (A)  Final      Susceptibility   Escherichia coli - MIC*    AMPICILLIN <=2 SENSITIVE Sensitive     CEFAZOLIN <=4 SENSITIVE Sensitive  CEFTRIAXONE <=1 SENSITIVE Sensitive     CIPROFLOXACIN <=0.25 SENSITIVE Sensitive     GENTAMICIN <=1 SENSITIVE Sensitive     IMIPENEM <=0.25 SENSITIVE Sensitive     NITROFURANTOIN <=16 SENSITIVE Sensitive     TRIMETH/SULFA <=20 SENSITIVE Sensitive     AMPICILLIN/SULBACTAM <=2 SENSITIVE Sensitive     PIP/TAZO <=4 SENSITIVE Sensitive     Extended ESBL NEGATIVE Sensitive     * >=100,000 COLONIES/mL ESCHERICHIA COLI  Surgical pcr screen     Status: None   Collection Time: 07/26/18  1:33 PM  Result Value Ref Range Status   MRSA, PCR NEGATIVE NEGATIVE Final   Staphylococcus aureus NEGATIVE NEGATIVE Final    Comment: (NOTE) The Xpert SA Assay (FDA approved for NASAL specimens in patients 64 years of age and older), is one component of a comprehensive surveillance program. It is not intended to diagnose infection nor to guide or monitor treatment. Performed at [facility], [address] [place], [place] [address]          Radiology Studies: No results found.      Scheduled Meds: .  feeding supplement (ENSURE ENLIVE)  237 mL Oral TID BM  . gabapentin  100 mg Oral TID  . potassium chloride  40 mEq Oral BID  . QUEtiapine  50 mg Oral QHS  . sertraline  100 mg Oral Daily  . traZODone  75 mg Oral QHS   Continuous Infusions: . sodium chloride 75 mL/hr at 07/27/18 0500  . cefTRIAXone (ROCEPHIN)  IV 1 g (07/27/18 1547)  . lactated ringers 50 mL/hr at 07/26/18 1353     LOS: 3 days    Time spent: 25 minutes.   Berton Mount, MD  [facility] Pager #: [phone] 7PM-7AM contact night coverage as above

## 2018-07-28 ENCOUNTER — Encounter (HOSPITAL_COMMUNITY): Payer: Self-pay | Admitting: Orthopaedic Surgery

## 2018-07-28 LAB — CBC WITH DIFFERENTIAL/PLATELET
Abs Immature Granulocytes: 0.04 10*3/uL (ref 0.00–0.07)
Basophils Absolute: 0 10*3/uL (ref 0.0–0.1)
Basophils Relative: 0 %
Eosinophils Absolute: 0.2 10*3/uL (ref 0.0–0.5)
Eosinophils Relative: 3 %
HCT: 28.9 % — ABNORMAL LOW (ref 36.0–46.0)
Hemoglobin: 9.5 g/dL — ABNORMAL LOW (ref 12.0–15.0)
Immature Granulocytes: 1 %
Lymphocytes Relative: 13 %
Lymphs Abs: 0.9 10*3/uL (ref 0.7–4.0)
MCH: 29.1 pg (ref 26.0–34.0)
MCHC: 32.9 g/dL (ref 30.0–36.0)
MCV: 88.4 fL (ref 80.0–100.0)
Monocytes Absolute: 0.7 10*3/uL (ref 0.1–1.0)
Monocytes Relative: 10 %
Neutro Abs: 5.1 10*3/uL (ref 1.7–7.7)
Neutrophils Relative %: 73 %
Platelets: 249 10*3/uL (ref 150–400)
RBC: 3.27 MIL/uL — ABNORMAL LOW (ref 3.87–5.11)
RDW: 13.1 % (ref 11.5–15.5)
WBC: 7 10*3/uL (ref 4.0–10.5)
nRBC: 0 % (ref 0.0–0.2)

## 2018-07-28 LAB — RENAL FUNCTION PANEL
Albumin: 2.5 g/dL — ABNORMAL LOW (ref 3.5–5.0)
Anion gap: 6 (ref 5–15)
BUN: 12 mg/dL (ref 8–23)
CO2: 28 mmol/L (ref 22–32)
Calcium: 8.3 mg/dL — ABNORMAL LOW (ref 8.9–10.3)
Chloride: 109 mmol/L (ref 98–111)
Creatinine, Ser: 0.88 mg/dL (ref 0.44–1.00)
GFR calc Af Amer: >60 mL/min (ref >60)
GFR calc non Af Amer: >60 mL/min (ref >60)
Glucose, Bld: 116 mg/dL — ABNORMAL HIGH (ref 70–99)
Phosphorus: 1.8 mg/dL — ABNORMAL LOW (ref 2.5–4.6)
Potassium: 4 mmol/L (ref 3.5–5.1)
Sodium: 143 mmol/L (ref 135–145)

## 2018-07-28 LAB — MAGNESIUM: Magnesium: 1.8 mg/dL (ref 1.7–2.4)

## 2018-07-28 MED ORDER — BISACODYL 5 MG PO TBEC
10.0000 mg | DELAYED_RELEASE_TABLET | Freq: Once | ORAL | Status: AC
  Administered 2018-07-28: 10 mg via ORAL
  Filled 2018-07-28: qty 2

## 2018-07-28 MED ORDER — SENNOSIDES-DOCUSATE SODIUM 8.6-50 MG PO TABS
3.0000 | ORAL_TABLET | Freq: Two times a day (BID) | ORAL | Status: DC
  Administered 2018-07-28 – 2018-07-29 (×3): 3 via ORAL
  Filled 2018-07-28 (×2): qty 3

## 2018-07-28 MED ORDER — SODIUM PHOSPHATES 45 MMOLE/15ML IV SOLN
20.0000 mmol | Freq: Once | INTRAVENOUS | Status: AC
  Administered 2018-07-28: 20 mmol via INTRAVENOUS
  Filled 2018-07-28 (×2): qty 6.67

## 2018-07-28 MED ORDER — POLYETHYLENE GLYCOL 3350 17 G PO PACK
34.0000 g | PACK | Freq: Once | ORAL | Status: AC
  Administered 2018-07-28: 34 g via ORAL
  Filled 2018-07-28: qty 2

## 2018-07-28 NOTE — Progress Notes (Signed)
Patient very confused and combative toward the staff.  Right hand IV infiltrated and has been discontinued. Right hand wrapped with Kerlix for compression for the small amount of edema noted. An order for the IV team to restart has been sent.  The patient's purewick was not positioned correctly due to much movement.  Incont of urine, peri care with bed linens have been changed.  The patient screamed and was swinging at staff during care.  The patient's daughter is at the bedside.  Purewick was replaced.  The patient was medicated for pain and the nurse has encouraged the daughter to allow patient to calm down and allow rest.  Room lights have been dimmed.  Fall precautions continued.

## 2018-07-28 NOTE — Progress Notes (Signed)
Patient has had moments of agitation and confusion.  The patient's daughters are at the bedside.  Right hand mitten in place in attempts to keep the patient from pulling out her IV.  Siderails up x 3 for safety.  Bed alarm on.

## 2018-07-28 NOTE — Clinical Social Work Note (Signed)
Clinical Social Work Assessment  Patient Details  Name: Heather Floyd MRN: [id number] Date of Birth: 08/17/1932  Date of referral:  07/28/18               Reason for consult:  Discharge Planning                Permission sought to share information with:  Case Manager, Facility Medical sales representativeContact Representative Permission granted to share information::  Yes, Verbal Permission Granted  Name::     Bonita QuinLinda  Agency::  SNFs  Relationship::  daughter  Contact Information:  [phone]  Housing/Transportation Living arrangements for the past 2 months:  Single Family Home Source of Information:  Adult Children Patient Interpreter Needed:  None Criminal Activity/Legal Involvement Pertinent to Current Situation/Hospitalization:  No - Comment as needed Significant Relationships:  Adult Children Lives with:  Other (Comment), Facility Resident([facility] memory care) Do you feel safe going back to the place where you live?  No Need for family participation in patient care:  Yes (Comment)  Care giving concerns:  CSW received referral for possible SNF placement at time of discharge. Spoke with patient's daughter Bonita QuinLinda as patient is pleasantly confused. Patient's daughter was very hostile both over the phone and in person to CSW. RNCM will be communicating with daughter for all future communication. Daughter Bonita QuinLinda expressed understanding of PT recommendation and are agreeable to SNF placement at time of discharge. CSW and RNCM will  continue to follow and assist with discharge planning needs.     Social Worker assessment / plan:  Spoke with patient's daughter Bonita QuinLinda  concerning [facility] before returning home. Due to hospitality from family, RNCM will be communicating with daughter Bonita QuinLinda for all needs.    Employment status:  Retired Database administratornsurance information:  Managed Medicare PT Recommendations:  Skilled Nursing Facility Information / Referral to community resources:  Skilled Nursing  Facility  Patient/Family's Response to care:  Patient's daughter Bonita QuinLinda recognizes need for rehab before returning home and are agreeable to a SNF in [place] or [place]. They report preference for [facility], [facility], or [facility]. CSW explained insurance authorization process. Patient's family reported that they want patient to get stronger to be able to come back home to [facility].   Patient/Family's Understanding of and Emotional Response to Diagnosis, Current Treatment, and Prognosis:  Patient/family is realistic regarding therapy needs and expressed being hopeful for SNF placement. Patient expressed understanding of CSW role and discharge process as well as medical condition. No questions/concerns about plan or treatment.    Emotional Assessment Appearance:  Appears stated age Attitude/Demeanor/Rapport:  Unable to Assess Affect (typically observed):  Unable to Assess Orientation:  Oriented to Self Alcohol / Substance use:  Not Applicable Psych involvement (Current and /or in the community):  No (Comment)  Discharge Needs  Concerns to be addressed:  Discharge Planning Concerns Readmission within the last 30 days:  No Current discharge risk:  Dependent with Mobility Barriers to Discharge:  Continued Medical Work up   Dynegyshley M Rheba Diamond, LCSW 07/28/2018, 10:44 AM

## 2018-07-28 NOTE — Progress Notes (Signed)
[facility] has initiated insurance auth today.   CSW will continue to follow up.   SchoeneckAshley Keane Martelli, [place] [phone]

## 2018-07-28 NOTE — Progress Notes (Signed)
PROGRESS NOTE    Heather Floyd  YBO:[id number] DOB: 11-Feb-1933 DOA: 07/23/2018 PCP: Daisy Floro, MD  Outpatient Specialists:   Brief Narrative:  Patient is an 85 year old Caucasian female, past medical history significant for dementia, TIA, restless leg syndrome, hypertension, hyperlipidemia, carotid artery occlusion and abnormal gait.  Patient was admitted following recurrent falls.  Imaging work-up done revealed left elbow (displaced left olecranon) fracture and left inferior pubic ramus fracture.  Patient also has acute kidney injury that is likely prerenal and UTI secondary to E. coli.  Patient underwent ORIF of the left elbow/olecranon fracture and repair of tendon yesterday, 07/26/2018.  Pubic ramus fracture will be managed conservatively.  Plan is to discharge patient to skilled nursing facility when patient is more stable.  Hypokalemia is noted.  We will continue to replete and monitor.  Continue IV antibiotics for UTI after tomorrow's dose.  Acute kidney injury has resolved.   Assessment & Plan:   Principal Problem:   Closed olecranon fracture, left, initial encounter Active Problems:   Memory disturbance   Hyperlipidemia   Hypertension   RLS (restless legs syndrome)   TIA (transient ischemic attack)   Dementia (HCC)   Dehydration   Fall   Acute lower UTI   Closed fracture of left inferior pubic ramus (HCC)   Pubic ramus fracture (HCC)   Rupture of left triceps tendon   Left Elbow fracture -due to fall, Ortho consult greatly appreciated, status post ORIF for left elbow/olecranon fracture and repair of tendon on 07/26/2018 by Dr.Xu -Further management per orthopedic team, pain medicine and DVT prophylaxis -Okay to DC sling  Unsteady gait, with history of multiple falls with resultant left elbow fracture and left inferior pubic ramus fracture:  -PT/OT consulted, recommendation for SNF placement, discussed with social worker   AKI: -creatinine1.75 on admission, most  likely volume depletion, resolved   UTI:  -Urine culture growing E. coli, treated with Rocephin -DC Foley catheter today  Hypokalemia: Continue to replete and monitor. Renal panel and magnesium in a.m.   Hypertension: Continue to optimize.  Hypophosphatemia -Being repleted today with sodium phosphate  Hypoxia on arrival, but she denies any cough, fever or sob.  On 3 lit of [place] oxygen and CXR shows bronchitis changes.  Influenza PCR is negative.  Monitor.   Dementia:  Intermittent agitation since surgery.  DVT prophylaxis: Lovenox.  Code Status: DNR Family Communication:  C caregiver at bedside Disposition Plan:  Will need SNF placement Consults called:  Dr Roda Shutters with [facility].   Procedures:   ORIF of left elbow on Monday, 07/26/2018  Antimicrobials:   Continue IV Rocephin (complete dose on 07/28/2018)   Subjective: No significant history from patient.  Objective: Vitals:   07/27/18 1201 07/27/18 1415 07/27/18 1619 07/28/18 0339  BP: (!) 148/88 126/64 (!) 143/68 (!) 168/87  Pulse: 97 (!) 103 (!) 101 97  Resp: 18 16 18 12   Temp: 98.8 F (37.1 C) 98.5 F (36.9 C) 98.6 F (37 C) 98.1 F (36.7 C)  TempSrc: Oral Oral Oral Oral  SpO2: 96% 91% 94% 92%  Weight:        Intake/Output Summary (Last 24 hours) at 07/28/2018 1230 Last data filed at 07/27/2018 1630 Gross per 24 hour  Intake 540 ml  Output 225 ml  Net 315 ml   Filed Weights   07/24/18 1800  Weight: 49 kg    Examination:  Awake , demented, poor cognition and insight  Good air movement bilaterally, CTAB RRR,No Gallops,Rubs or  new Murmurs, No Parasternal Heave +ve B.Sounds, Abd Soft, No tenderness, No rebound - guarding or rigidity. No Cyanosis, Clubbing or edema, No new Rash or bruise     .  Data Reviewed: I have personally reviewed following labs and imaging studies  CBC: Recent Labs  Lab 07/23/18 1205 07/25/18 0420 07/27/18 0711 07/28/18 0155  WBC 12.3* 9.8 6.7 7.0    NEUTROABS 11.0* 7.7 5.7 5.1  HGB 12.3 11.4* 10.4* 9.5*  HCT 37.2 32.7* 30.9* 28.9*  MCV 91.4 88.1 86.3 88.4  PLT 188 180 216 249   Basic Metabolic Panel: Recent Labs  Lab 07/23/18 1205 07/25/18 0420 07/27/18 0711 07/28/18 0155  NA 138 139 142 143  K 3.8 3.6 3.0* 4.0  CL 103 107 104 109  CO2 24 22 25 28   GLUCOSE 133* 110* 125* 116*  BUN 37* 17 13 12   CREATININE 1.75* 1.19* 1.02* 0.88  CALCIUM 9.0 8.1* 8.4* 8.3*  MG  --   --  1.9 1.8  PHOS  --  2.0* 3.0 1.8*   GFR: Estimated Creatinine Clearance: 37.5 mL/min (by C-G formula based on SCr of 0.88 mg/dL). Liver Function Tests: Recent Labs  Lab 07/23/18 1205 07/25/18 0420 07/27/18 0711 07/28/18 0155  AST 39  --   --   --   ALT 24  --   --   --   ALKPHOS 51  --   --   --   BILITOT 0.6  --   --   --   PROT 7.1  --   --   --   ALBUMIN 4.2 3.0* 2.8* 2.5*   No results for input(s): LIPASE, AMYLASE in the last 168 hours. No results for input(s): AMMONIA in the last 168 hours. Coagulation Profile: No results for input(s): INR, PROTIME in the last 168 hours. Cardiac Enzymes: No results for input(s): CKTOTAL, CKMB, CKMBINDEX, TROPONINI in the last 168 hours. BNP (last 3 results) No results for input(s): PROBNP in the last 8760 hours. HbA1C: No results for input(s): HGBA1C in the last 72 hours. CBG: No results for input(s): GLUCAP in the last 168 hours. Lipid Profile: No results for input(s): CHOL, HDL, LDLCALC, TRIG, CHOLHDL, LDLDIRECT in the last 72 hours. Thyroid Function Tests: No results for input(s): TSH, T4TOTAL, FREET4, T3FREE, THYROIDAB in the last 72 hours. Anemia Panel: No results for input(s): VITAMINB12, FOLATE, FERRITIN, TIBC, IRON, RETICCTPCT in the last 72 hours. Urine analysis:    Component Value Date/Time   COLORURINE YELLOW 07/23/2018 1640   APPEARANCEUR HAZY (A) 07/23/2018 1640   LABSPEC 1.020 07/23/2018 1640   PHURINE 5.0 07/23/2018 1640   GLUCOSEU NEGATIVE 07/23/2018 1640   HGBUR NEGATIVE  07/23/2018 1640   BILIRUBINUR NEGATIVE 07/23/2018 1640   KETONESUR 5 (A) 07/23/2018 1640   PROTEINUR NEGATIVE 07/23/2018 1640   UROBILINOGEN 1.0 09/30/2014 1211   NITRITE POSITIVE (A) 07/23/2018 1640   LEUKOCYTESUR NEGATIVE 07/23/2018 1640   Sepsis Labs: @LABRCNTIP (procalcitonin:4,lacticidven:4)  ) Recent Results (from the past 240 hour(s))  Urine culture     Status: Abnormal   Collection Time: 07/23/18  4:40 PM  Result Value Ref Range Status   Specimen Description URINE, CATHETERIZED  Final   Special Requests   Final    Normal Performed at [facility], [address] [place], [place] [address]    Culture >=100,000 COLONIES/mL ESCHERICHIA COLI (A)  Final   Report Status 07/26/2018 FINAL  Final   Organism ID, Bacteria ESCHERICHIA COLI (A)  Final  Susceptibility   Escherichia coli - MIC*    AMPICILLIN <=2 SENSITIVE Sensitive     CEFAZOLIN <=4 SENSITIVE Sensitive     CEFTRIAXONE <=1 SENSITIVE Sensitive     CIPROFLOXACIN <=0.25 SENSITIVE Sensitive     GENTAMICIN <=1 SENSITIVE Sensitive     IMIPENEM <=0.25 SENSITIVE Sensitive     NITROFURANTOIN <=16 SENSITIVE Sensitive     TRIMETH/SULFA <=20 SENSITIVE Sensitive     AMPICILLIN/SULBACTAM <=2 SENSITIVE Sensitive     PIP/TAZO <=4 SENSITIVE Sensitive     Extended ESBL NEGATIVE Sensitive     * >=100,000 COLONIES/mL ESCHERICHIA COLI  Surgical pcr screen     Status: None   Collection Time: 07/26/18  1:33 PM  Result Value Ref Range Status   MRSA, PCR NEGATIVE NEGATIVE Final   Staphylococcus aureus NEGATIVE NEGATIVE Final    Comment: (NOTE) The Xpert SA Assay (FDA approved for NASAL specimens in patients 83 years of age and older), is one component of a comprehensive surveillance program. It is not intended to diagnose infection nor to guide or monitor treatment. Performed at [facility], [address] [place], [place] [address]          Radiology Studies: No results  found.      Scheduled Meds: . bisacodyl  10 mg Oral Once  . feeding supplement (ENSURE ENLIVE)  237 mL Oral TID BM  . gabapentin  100 mg Oral TID  . polyethylene glycol  34 g Oral Once  . QUEtiapine  50 mg Oral QHS  . senna-docusate  3 tablet Oral BID  . sertraline  100 mg Oral Daily  . traZODone  75 mg Oral QHS   Continuous Infusions: . sodium chloride 75 mL/hr at 07/27/18 1430  . cefTRIAXone (ROCEPHIN)  IV Stopped (07/27/18 1630)  . lactated ringers 50 mL/hr at 07/26/18 1353  . sodium phosphate  Dextrose 5% IVPB       LOS: 4 days     Huey Bienenstockawood Isobella Ascher, MD  [facility] Pager #: [phone] 7PM-7AM contact night coverage as above

## 2018-07-28 NOTE — Care Management (Signed)
Case manager spoke with patient's daughter [name] concerning short term rehab for her mom. [name] asked that the search for a bed be extended to the [place] area. She says that at this point a locked memory care unit isn't required, she will have her mom moved when she gets better. CM provided unit SW, Morrie Sheldon with names of facilities [name] is interested in: [facility], [facility], [facility].

## 2018-07-29 MED ORDER — ENSURE ENLIVE PO LIQD: 237.0000 mL | Freq: Three times a day (TID) | ORAL | 12 refills | Status: AC

## 2018-07-29 MED ORDER — BISACODYL 5 MG PO TBEC: 5.0000 mg | DELAYED_RELEASE_TABLET | Freq: Every day | ORAL | 0 refills | Status: DC | PRN

## 2018-07-29 MED ORDER — SENNOSIDES-DOCUSATE SODIUM 8.6-50 MG PO TABS: 3.0000 | ORAL_TABLET | Freq: Two times a day (BID) | ORAL | Status: AC

## 2018-07-29 MED ORDER — OXYCODONE HCL 5 MG PO TABS: 5.0000 mg | ORAL_TABLET | Freq: Four times a day (QID) | ORAL | 0 refills | Status: DC | PRN

## 2018-07-29 MED ORDER — BISACODYL 5 MG PO TBEC
5.0000 mg | DELAYED_RELEASE_TABLET | Freq: Every day | ORAL | Status: DC | PRN
  Administered 2018-07-29: 5 mg via ORAL
  Filled 2018-07-29: qty 1

## 2018-07-29 MED ORDER — POLYETHYLENE GLYCOL 3350 17 G PO PACK: 17.0000 g | PACK | Freq: Every day | ORAL | 0 refills | Status: AC | PRN

## 2018-07-29 MED ORDER — MAGNESIUM CITRATE PO SOLN
1.0000 | Freq: Once | ORAL | Status: AC
  Administered 2018-07-29: 1 via ORAL
  Filled 2018-07-29: qty 296

## 2018-07-29 NOTE — Progress Notes (Signed)
RN Spoke to Dawson, Charity fundraiser at [employer] and gave report. Pt awaiting PTAR will remove IV

## 2018-07-29 NOTE — Progress Notes (Signed)
Physical Therapy Treatment Patient Details Name: Heather Floyd MRN: [id number] DOB: 1932-08-14 Today's Date: 07/29/2018    History of Present Illness 85 y.o. female admitted on 07/23/18 due to repeated falls and inability to walk, possible UTI.  Pt dx with L inferior pubic rami fx and L elbow displaced intra articular olecranon fx. ORIF L olecranon fx with repair of triceps tear 07/26/18.  Pt with significant PMH of TIA, RLS, memory disturbance, HTN, concussion, carotid artery occlusion, and L THA (anterior hemi hip).    PT Comments    Patient seen for mobility progression. Pt requires max/total A +2 for bed mobility and functional transfers this session. Pt continues to become agitated and combative with mobility. Continue to progress as tolerated.   Follow Up Recommendations  SNF     Equipment Recommendations  Hospital bed;Wheelchair (measurements PT);Wheelchair cushion (measurements PT)    Recommendations for Other Services       Precautions / Restrictions Precautions Precautions: Fall Required Braces or Orthoses: Sling(for comfort) Restrictions Weight Bearing Restrictions: Yes LUE Weight Bearing: Non weight bearing LLE Weight Bearing: Weight bearing as tolerated    Mobility  Bed Mobility Overal bed mobility: Needs Assistance Bed Mobility: Supine to Sit     Supine to sit: +2 for physical assistance;Total assist     General bed mobility comments: total A for all bed mobility; pt agreeable to sit up and then when moving becomes resistive and pushes   Transfers Overall transfer level: Needs assistance Equipment used: (gait belt) Transfers: Sit to/from Stand;Stand Pivot Transfers Sit to Stand: Max assist;+2 physical assistance Stand pivot transfers: Max assist;+2 physical assistance       General transfer comment: cues for sequencing and attempting to calm pt down; pt maintained flexed posture throughout; pt pinching therapist's arm throughout transfer; L knee blocked  during transfer; sling used due to pt weight bearing on L UE   Ambulation/Gait             General Gait Details: unable to attempt   Stairs             Wheelchair Mobility    Modified Rankin (Stroke Patients Only)       Balance Overall balance assessment: History of Falls;Needs assistance   Sitting balance-Leahy Scale: Poor Sitting balance - Comments: due to how restless pt was sitting EOB; pt fearful of falling      Standing balance-Leahy Scale: Poor                              Cognition Arousal/Alertness: Awake/alert Behavior During Therapy: Restless Overall Cognitive Status: History of cognitive impairments - at baseline                                 General Comments: pt becomes agressive and agitated with mobility despite attempts to explain plan and calm down; pt pinched and pushed therapist       Exercises      General Comments        Pertinent Vitals/Pain Pain Assessment: Faces Faces Pain Scale: Hurts little more Pain Location: neck upon arrival and then unspecified with mobility Pain Descriptors / Indicators: Grimacing;Guarding(yelling "i want to lay down") Pain Intervention(s): Limited activity within patient's tolerance;Monitored during session;Repositioned    Home Living  Prior Function            PT Goals (current goals can now be found in the care plan section) Acute Rehab PT Goals Patient Stated Goal: daughter wants her mother to go to rehab to get back on her feet and walking again Progress towards PT goals: Not progressing toward goals - comment(pt becomes agitated with mobility )    Frequency    Min 3X/week      PT Plan Current plan remains appropriate    Co-evaluation              AM-PAC PT "6 Clicks" Mobility   Outcome Measure  Help needed turning from your back to your side while in a flat bed without using bedrails?: Total Help needed moving from  lying on your back to sitting on the side of a flat bed without using bedrails?: Total Help needed moving to and from a bed to a chair (including a wheelchair)?: Total Help needed standing up from a chair using your arms (e.g., wheelchair or bedside chair)?: Total Help needed to walk in hospital room?: Total Help needed climbing 3-5 steps with a railing? : Total 6 Click Score: 6    End of Session Equipment Utilized During Treatment: Gait belt;Other (comment)(L UE sling) Activity Tolerance: Patient limited by pain;Treatment limited secondary to agitation Patient left: in chair;with call bell/phone within reach;with chair alarm set;with nursing/sitter in room;with family/visitor present Nurse Communication: Mobility status;Other (comment)(agitated) PT Visit Diagnosis: Muscle weakness (generalized) (M62.81);Difficulty in walking, not elsewhere classified (R26.2);Pain Pain - Right/Left: Left Pain - part of body: Arm     Time: 4917-9150 PT Time Calculation (min) (ACUTE ONLY): 15 min  Charges:  $Gait Training: 8-22 mins                     Heather Floyd, PTA Acute Rehabilitation Services Pager: [phone] Office: [phone]     Heather Floyd 07/29/2018, 3:32 PM

## 2018-07-29 NOTE — Clinical Social Work Placement (Signed)
   CLINICAL SOCIAL WORK PLACEMENT  NOTE  Date:  07/29/2018  Patient Details  Name: Heather Floyd MRN: [id number] Date of Birth: 1932/11/02  Clinical Social Work is seeking post-discharge placement for this patient at the Skilled  Nursing Facility level of care (*CSW will initial, date and re-position this form in  chart as items are completed):      Patient/family provided with [facility] Clinical Social Work Department's list of facilities offering this level of care within the geographic area requested by the patient (or if unable, by the patient's family).  Yes   Patient/family informed of their freedom to choose among providers that offer the needed level of care, that participate in Medicare, Medicaid or managed care program needed by the patient, have an available bed and are willing to accept the patient.      Patient/family informed of [facility] ownership interest in [facility] and [facility], as well as of the fact that they are under no obligation to receive care at these facilities.  PASRR submitted to EDS on       PASRR number received on 07/27/18     Existing PASRR number confirmed on       FL2 transmitted to all facilities in geographic area requested by pt/family on 07/27/18     FL2 transmitted to all facilities within larger geographic area on       Patient informed that his/her managed care company has contracts with or will negotiate with certain facilities, including the following:        Yes   Patient/family informed of bed offers received.  Patient [facility] bed at [facility]     Physician recommends and patient chooses bed at      Patient to be transferred to [facility] on 07/29/18.  Patient to be transferred to facility by PTAR     Patient family notified on 07/29/18 of transfer.  Name of family member notified:  [name]     PHYSICIAN       Additional Comment:     _______________________________________________ Gildardo Griffes, LCSW 07/29/2018, 2:44 PM

## 2018-07-29 NOTE — Progress Notes (Signed)
RN called [facility] and attempted to give report no answer from the Nurse will try again

## 2018-07-29 NOTE — Progress Notes (Signed)
[facility] and attempted to give report still no answer from the receiving RN.

## 2018-07-29 NOTE — Progress Notes (Signed)
RN notified sitter that we would be doing an enema to help her have a BM and she called the patients  daughter and told her and her daughter stated " she absolutely did not want her mother to get an enema that we should let her go on her own" . Will notify MD

## 2018-07-29 NOTE — Discharge Summary (Signed)
Heather Floyd, is a 85 y.o. female  DOB December 26, 1932  MRN [id number].  Admission date:  07/23/2018  Admitting Physician  Kathlen Mody, MD  Discharge Date:  07/29/2018   Primary MD  Daisy Floro, MD  Recommendations for primary care physician for things to follow:  -Check CBC, BMP in 3 days -To follow with orthopedic in 2 weeks(Dr Xu)  Admission Diagnosis  Hypoxemia [R09.02] Fall [W19.XXXA] AKI (acute kidney injury) (HCC) [N17.9] Fall, initial encounter [W19.XXXA] Closed fracture of left inferior pubic ramus, initial encounter (HCC) [S32.592A] Closed fracture of left upper extremity, initial encounter [S42.302A]   Discharge Diagnosis  Hypoxemia [R09.02] Fall [W19.XXXA] AKI (acute kidney injury) (HCC) [N17.9] Fall, initial encounter [W19.XXXA] Closed fracture of left inferior pubic ramus, initial encounter (HCC) [S32.592A] Closed fracture of left upper extremity, initial encounter [S42.302A]    Principal Problem:   Closed olecranon fracture, left, initial encounter Active Problems:   Memory disturbance   Hyperlipidemia   Hypertension   RLS (restless legs syndrome)   TIA (transient ischemic attack)   Dementia (HCC)   Dehydration   Fall   Acute lower UTI   Closed fracture of left inferior pubic ramus (HCC)   Pubic ramus fracture (HCC)   Rupture of left triceps tendon      Past Medical History:  Diagnosis Date  . Abnormality of gait 09/08/2014  . Carotid artery occlusion   . Concussion with loss of consciousness 09/08/2014  . DJD (degenerative joint disease) of cervical spine   . Hyperlipidemia   . Hypertension   . Memory disturbance 03/29/2013  . Osteoporosis   . Personal history of colonic polyps   . RLS (restless legs syndrome)   . Sleep disorder   . TIA (transient ischemic attack)   . Varicose veins     Past Surgical History:  Procedure Laterality Date  . ANTERIOR AND  POSTERIOR VAGINAL REPAIR W/ SACROSPINOUS LIGAMENT SUSPENSION    . CESAREAN SECTION    . ENDOVENOUS ABLATION SAPHENOUS VEIN W/ LASER    . HEMORRHOID SURGERY    . JOINT REPLACEMENT    . ORIF ELBOW FRACTURE Left 07/26/2018   Procedure: OPEN REDUCTION INTERNAL FIXATION (ORIF) ELBOW/OLECRANON FRACTURE;  Surgeon: Tarry Kos, MD;  Location: [facility] OR;  Service: Orthopedics;  Laterality: Left;  . REDUCTION MAMMAPLASTY    . TOTAL HIP ARTHROPLASTY Left 10/19/2017   Procedure: ANTERIOR HEMI HIP;  Surgeon: Tarry Kos, MD;  Location: [facility] OR;  Service: Orthopedics;  Laterality: Left;       History of present illness and  Hospital Course:     Kindly see H&P for history of present illness and admission details, please review complete Labs, Consult reports and Test reports for all details in brief  HPI  from the history and physical done on the day of admission 07/23/2018  HPI: Heather Floyd is a 85 y.o. female with medical history significant of dementia, hypertension, hyperlipidemia, RLS, TIA, was initially brought in yesterday after a fall yesterday afternoon, she underwent CT  head, and cervical spine and was discharged back to the memory care unit. She was brought in for another fall last night and this morning and complained about pain in the left elbow and unable to walk. They were unwitnessed and patient is unable to provide any significant history due to dementia, and left arm pain. As per the daughter at bedside, pt's mental status is at baseline. Her care giver from 2 days ago reports that patient had urinary frequency and foul smelling urine. No history of fevers or dysuria. No other history is available as pt has dementia.   ED Course: On arrival to ED, patient was afebrile, slightly tachycardic, tachypnea, oxygen sats at 87% on RA.  CXR showed Bronchitic changes with minimal bibasilar atelectasis and improved LEFT pleural effusion. CT head and cervical spine without contrast showed No acute  intracranial abnormalities.  No skull fracture.  X rays of the hips show Fracture LEFT inferior pubic ramus. X rays of the Elbow Displaced intra-articular olecranon fracture with significant soft tissue swelling.   DR Roda Shutters with [facility] consulted, who recommended left arm splint and transfer to [facility], so they can see the patient tomorrow and determine if she needs surgery.   Lab work is significant for mild leukocytosis, creatinine of 1.5 , BUN OF 37.   UA show positive nitrites, with many bacteria.  Urine cultures sent.  Influenza PCR is negative.   She was referred to medical service for admission for falls, left elbow fracture and possible UTI.    Hospital Course   Patient is an 85 year old Caucasian female, past medical history significant for dementia, TIA, restless leg syndrome, hypertension, hyperlipidemia, carotid artery occlusion and abnormal gait.  Patient was admitted following recurrent falls.  Imaging work-up done revealed left elbow (displaced left olecranon) fracture and left inferior pubic ramus fracture.  Patient also has acute kidney injury that is likely prerenal and UTI secondary to E. coli.  Patient underwent ORIF of the left elbow/olecranon fracture and repair of tendon yesterday, 07/26/2018.  Pubic ramus fracture will be managed conservatively.  Plan is to discharge patient to skilled nursing facility when patient is more stable.  Hypokalemia is noted.  We will continue to replete and monitor.  Continue IV antibiotics for UTI after tomorrow's dose.  Acute kidney injury has resolved.    Left Elbow fracture -due to fall, Ortho consult greatly appreciated, status post ORIF for left elbow/olecranon fracture and repair of tendon on 07/26/2018 by Dr.Xu -To follow as an outpatient with orthopedic Dr. Roda Shutters -Okay to DC sling per ortho  Unsteady gait, with history of multiple falls with resultant left elbow fractureand left inferior pubic ramus fracture:  -PT/OT  consulted, recommendation for SNF placement,   AKI: -creatinine1.75 on admission, most likely volume depletion, resolved   UTI:  -Urine culture growing E. coli, treated with Rocephin -Catheter was inserted on admission, was discontinued yesterday, she has been urinating normal, with no evidence of retention  Hypokalemia: repleted  Hypertension: Blood pressure has been acceptable off medication during hospital stay, she has been resumed on amlodipine, losartan has been discontinued on discharge  Hypophosphatemia-repleted  Acute  hypoxic respiratory failure  -on Presentation was hypoxic requiring nasal cannula, this has resolved, most likely related to bronchitis  - CXR shows bronchitis changes.  Influenza PCR is negative.  Monitor.   Dementia:  Intermittent agitation since surgery. Continue with home regimen  Constipation -Exam is benign, she did receive stool softener, and laxatives, family has declined enemas, will be  discharged on senna/docusate 3 tablets twice daily, with PRN Lasix, she just finished drinking magnesium citrate recently, so I will anticipate she will be having good bowel movement pain today.    Discharge Condition:  Stable  Follow UP  Follow-up Information    Tarry KosXu, Naiping M, MD In 2 weeks.   Specialty:  Orthopedic Surgery Why:  For suture removal, For wound re-check Contact information: [address] [place] [id number] [phone]             Discharge Instructions  and  Discharge Medications     Discharge Instructions    Discharge instructions   Complete by:  As directed    Follow with Primary MD [name], Charles Alan, MD in 7 days   Get CBC, CMP,  checked  by Primary MD next visit.    Activity: As tolerated with Full fall precautions use walker/cane & assistance as needed   Disposition snf   Diet: Regular diet , with feeding assistance and aspiration precautions.  For Heart failure patients - Check your  Weight same time everyday, if you gain over 2 pounds, or you develop in leg swelling, experience more shortness of breath or chest pain, call your Primary MD immediately. Follow Cardiac Low Salt Diet and 1.5 lit/day fluid restriction.   On your next visit with your primary care physician please Get Medicines reviewed and adjusted.   Please request your Prim.MD to go over all Hospital Tests and Procedure/Radiological results at the follow up, please get all Hospital records sent to your Prim MD by signing hospital release before you go home.   If you experience worsening of your admission symptoms, develop shortness of breath, life threatening emergency, suicidal or homicidal thoughts you must seek medical attention immediately by calling 911 or calling your MD immediately  if symptoms less severe.  You Must read complete instructions/literature along with all the possible adverse reactions/side effects for all the Medicines you take and that have been prescribed to you. Take any new Medicines after you have completely understood and accpet all the possible adverse reactions/side effects.   Do not drive, operating heavy machinery, perform activities at heights, swimming or participation in water activities or provide baby sitting services if your were admitted for syncope or siezures until you have seen by Primary MD or a Neurologist and advised to do so again.  Do not drive when taking Pain medications.    Do not take more than prescribed Pain, Sleep and Anxiety Medications  Special Instructions: If you have smoked or chewed Tobacco  in the last 2 yrs please stop smoking, stop any regular Alcohol  and or any Recreational drug use.  Wear Seat belts while driving.   Please note  You were cared for by a hospitalist during your hospital stay. If you have any questions about your discharge medications or the care you received while you were in the hospital after you are discharged, you can call  the unit and asked to speak with the hospitalist on call if the hospitalist that took care of you is not available. Once you are discharged, your primary care physician will handle any further medical issues. Please note that NO REFILLS for any discharge medications will be authorized once you are discharged, as it is imperative that you return to your primary care physician (or establish a relationship with a primary care physician if you do not have one) for your aftercare needs so that they can reassess your need for medications  and monitor your lab values.   Increase activity slowly   Complete by:  As directed      Allergies as of 07/29/2018      Reactions   Aspirin Other (See Comments)   Makes blood too thin   Codeine Nausea Only      Medication List    STOP taking these medications   lisinopril 40 MG tablet Commonly known as:  PRINIVIL,ZESTRIL     TAKE these medications   acetaminophen 160 MG/5ML solution Commonly known as:  TYLENOL Take 15.6 mLs (500 mg total) by mouth every 6 (six) hours as needed for mild pain.   amLODipine 5 MG tablet Commonly known as:  NORVASC Take 5 mg by mouth daily.   bisacodyl 5 MG EC tablet Commonly known as:  DULCOLAX Take 1 tablet (5 mg total) by mouth daily as needed for moderate constipation.   feeding supplement (ENSURE ENLIVE) Liqd Take 237 mLs by mouth 3 (three) times daily between meals.   gabapentin 100 MG capsule Commonly known as:  NEURONTIN Take 100 mg by mouth 3 (three) times daily.   hydrOXYzine 25 MG tablet Commonly known as:  ATARAX/VISTARIL Take 1 tablet (25 mg total) by mouth 3 (three) times daily as needed.   oxyCODONE 5 MG immediate release tablet Commonly known as:  Oxy IR/ROXICODONE Take 1 tablet (5 mg total) by mouth every 6 (six) hours as needed for severe pain.   polyethylene glycol packet Commonly known as:  MIRALAX Take 17 g by mouth daily as needed for moderate constipation.   QUEtiapine 50 MG  tablet Commonly known as:  SEROQUEL Take 1 tablet (50 mg total) by mouth at bedtime.   senna-docusate 8.6-50 MG tablet Commonly known as:  Senokot-S Take 3 tablets by mouth 2 (two) times daily.   sertraline 100 MG tablet Commonly known as:  ZOLOFT Take 1 tablet (100 mg total) by mouth daily.   traZODone 50 MG tablet Commonly known as:  DESYREL Take 1.5 tablets (75 mg total) by mouth at bedtime.         Diet and Activity recommendation: See Discharge Instructions above   Consults obtained - ortho   Major procedures and Radiology Reports - PLEASE review detailed and final reports for all details, in brief -   ORIF of left elbow on Monday, 07/26/2018 by Dr [facility]Xu with [facility].   Dg Chest 1 View  Result Date: 07/23/2018 CLINICAL DATA:  Shortness of breath, multiple recent falls EXAM: CHEST  1 VIEW COMPARISON:  10/21/2017 FINDINGS: Normal heart size and mediastinal contours. Atherosclerotic calcification aorta. Bronchitic changes with minimal bibasilar atelectasis. Tiny LEFT pleural effusion, decreased. No acute infiltrate or pneumothorax. Bones demineralized. IMPRESSION: Bronchitic changes with minimal bibasilar atelectasis and improved LEFT pleural effusion. Electronically Signed   By: Ulyses SouthwardMark  Boles M.D.   On: 07/23/2018 13:35   Dg Elbow 2 Views Left  Result Date: 07/23/2018 CLINICAL DATA:  Proximal ulna fracture. EXAM: LEFT ELBOW - 2 VIEW 4:43 p.m. COMPARISON:  Radiographs dated 07/23/2018 at 1:05 p.m. FINDINGS: The patient is been placed in a splint since the prior study. There is a distracted fracture of the olecranon process of the proximal ulna. Distraction is approximately 12 mm. There is also slight rotation of the proximal fragment. There is also an impaction fracture of the articular surface of the proximal ulna seen on the lateral view. Hemarthrosis.  Distal humerus and proximal radius are intact. IMPRESSION: Distracted slightly rotated fracture of the olecranon  process of  the proximal ulna. Impaction fracture of the articular surface of ulna anterior to the olecranon process. Electronically Signed   By: Francene Boyers M.D.   On: 07/23/2018 16:57   Dg Elbow 2 Views Left  Result Date: 07/23/2018 CLINICAL DATA:  Severe LEFT arm pain, multiple recent falls EXAM: LEFT ELBOW - 2 VIEW COMPARISON:  None FINDINGS: Osseous demineralization. Radiocapitellar alignment normal. No true lateral view. Displaced intra-articular olecranon fracture with significant dorsal soft tissue swelling. IMPRESSION: Displaced intra-articular olecranon fracture with significant soft tissue swelling. Electronically Signed   By: Ulyses Southward M.D.   On: 07/23/2018 13:31   Dg Wrist Complete Left  Result Date: 07/23/2018 CLINICAL DATA:  Multiple recent falls, severe LEFT arm pain EXAM: LEFT WRIST - COMPLETE 3+ VIEW COMPARISON:  04/12/2018 FINDINGS: Osseous demineralization. Joint space narrowing throughout carpus. No acute fracture, dislocation, or bone destruction. IMPRESSION: Osseous demineralization with advanced degenerative changes of the LEFT wrist. No acute abnormalities. Electronically Signed   By: Ulyses Southward M.D.   On: 07/23/2018 13:34   Dg Knee 2 Views Right  Result Date: 07/22/2018 CLINICAL DATA:  Pt to ED with c/o of an unwitnessed fall. Pt is from [facility] facility and was in the shower this morning, pt fell to the bottom of the shower. Staff was not present but heard it, and found her sitting up in the tub. Pt has hx of alz dementia. When [facility] arrived patient was walking around but complaining of right hip and right knee pain 10/10. No deformities present. EXAM: RIGHT KNEE - 1-2 VIEW COMPARISON:  None. FINDINGS: No evidence of fracture, dislocation, or joint effusion. No evidence of arthropathy or other focal bone abnormality. Soft tissues are unremarkable. IMPRESSION: Negative. Electronically Signed   By: Amie [place] M.D.   On: 07/22/2018 15:17   Ct Head Wo  Contrast  Result Date: 07/23/2018 CLINICAL DATA:  LU, EDISON V on 07/23/2018 at  1:03 PM EXAM: CT HEAD WITHOUT CONTRAST CT CERVICAL SPINE WITHOUT CONTRAST TECHNIQUE: Multidetector CT imaging of the head and cervical spine was performed following the standard protocol without intravenous contrast. Multiplanar CT image reconstructions of the cervical spine were also generated. COMPARISON:  07/22/2018. FINDINGS: CT HEAD FINDINGS Brain: No evidence of acute infarction, hemorrhage, hydrocephalus, extra-axial collection or mass lesion/mass effect. Mild ventricular sulcal enlargement reflecting age-appropriate volume loss. Mild white matter hypoattenuation consistent with chronic microvascular ischemic change. Vascular: No hyperdense vessel or unexpected calcification. Skull: Normal. Negative for fracture or focal lesion. Sinuses/Orbits: No acute finding. Other: None. CT CERVICAL SPINE FINDINGS Images degraded by motion. Alignment: Normal. Skull base and vertebrae: No acute fracture. No primary bone lesion or focal pathologic process. Soft tissues and spinal canal: No prevertebral fluid or swelling. No visible canal hematoma. No soft tissue masses.  No adenopathy. Disc levels: Mild loss of disc height at C4-C5. Moderate loss of disc height at C5-C6 and C6-C7. Facet degenerative changes noted greatest on the right at C3-C4 and C4-C5. No convincing disc herniation. Upper chest: No acute findings.  Clear lung apices. Other: None. IMPRESSION: HEAD CT 1. No acute intracranial abnormalities. 2. No skull fracture. CERVICAL CT 1. No fracture or acute finding. Electronically Signed   By: Amie [place] M.D.   On: 07/23/2018 13:10   Ct Head Wo Contrast  Result Date: 07/22/2018 CLINICAL DATA:  Unwitnessed fall this morning. History of Alzheimer's dementia. RIGHT hip pain and RIGHT knee pain. EXAM: CT HEAD WITHOUT CONTRAST CT CERVICAL SPINE WITHOUT CONTRAST TECHNIQUE: Multidetector CT imaging of  the head and cervical spine was  performed following the standard protocol without intravenous contrast. Multiplanar CT image reconstructions of the cervical spine were also generated. COMPARISON:  Head CT dated 03/16/2017. FINDINGS: CT HEAD FINDINGS Brain: Ventricles are stable in size and configuration. Mild chronic small vessel ischemic change noted in the deep periventricular white matter regions. No mass, hemorrhage, edema or other evidence of acute parenchymal abnormality. No extra-axial hemorrhage. Vascular: Chronic calcified atherosclerotic changes of the large vessels at the skull base. No unexpected hyperdense vessel. Skull: Normal. Negative for fracture or focal lesion. Sinuses/Orbits: No acute finding. Other: Scalp edema overlying the RIGHT frontoparietal bone. No underlying fracture. CT CERVICAL SPINE FINDINGS Alignment: Dextroscoliosis of the cervicothoracic spine, possibly accentuated by patient positioning. No evidence of acute vertebral body subluxation. Skull base and vertebrae: No fracture line or displaced fracture fragment appreciated. Facet joints appear appropriately aligned. Soft tissues and spinal canal: No prevertebral fluid or swelling. No visible canal hematoma. Disc levels: Mild degenerative spondylosis at the C5-6 and C6-7 levels. No significant central canal stenosis at any level. Upper chest: No acute findings. Other: Bilateral carotid atherosclerosis. IMPRESSION: 1. Scalp edema overlying the RIGHT frontoparietal bone. No underlying fracture. 2. No acute intracranial abnormality. No intracranial mass, hemorrhage or edema. No skull fracture. 3. No fracture or acute subluxation within the cervical spine. 4. Carotid atherosclerosis. Electronically Signed   By: Bary Richard M.D.   On: 07/22/2018 16:15   Ct Cervical Spine Wo Contrast  Result Date: 07/23/2018 CLINICAL DATA:  LU, EDISON V on 07/23/2018 at  1:03 PM EXAM: CT HEAD WITHOUT CONTRAST CT CERVICAL SPINE WITHOUT CONTRAST TECHNIQUE: Multidetector CT imaging of the  head and cervical spine was performed following the standard protocol without intravenous contrast. Multiplanar CT image reconstructions of the cervical spine were also generated. COMPARISON:  07/22/2018. FINDINGS: CT HEAD FINDINGS Brain: No evidence of acute infarction, hemorrhage, hydrocephalus, extra-axial collection or mass lesion/mass effect. Mild ventricular sulcal enlargement reflecting age-appropriate volume loss. Mild white matter hypoattenuation consistent with chronic microvascular ischemic change. Vascular: No hyperdense vessel or unexpected calcification. Skull: Normal. Negative for fracture or focal lesion. Sinuses/Orbits: No acute finding. Other: None. CT CERVICAL SPINE FINDINGS Images degraded by motion. Alignment: Normal. Skull base and vertebrae: No acute fracture. No primary bone lesion or focal pathologic process. Soft tissues and spinal canal: No prevertebral fluid or swelling. No visible canal hematoma. No soft tissue masses.  No adenopathy. Disc levels: Mild loss of disc height at C4-C5. Moderate loss of disc height at C5-C6 and C6-C7. Facet degenerative changes noted greatest on the right at C3-C4 and C4-C5. No convincing disc herniation. Upper chest: No acute findings.  Clear lung apices. Other: None. IMPRESSION: HEAD CT 1. No acute intracranial abnormalities. 2. No skull fracture. CERVICAL CT 1. No fracture or acute finding. Electronically Signed   By: Amie [place] M.D.   On: 07/23/2018 13:10   Ct Cervical Spine Wo Contrast  Result Date: 07/22/2018 CLINICAL DATA:  Unwitnessed fall this morning. History of Alzheimer's dementia. RIGHT hip pain and RIGHT knee pain. EXAM: CT HEAD WITHOUT CONTRAST CT CERVICAL SPINE WITHOUT CONTRAST TECHNIQUE: Multidetector CT imaging of the head and cervical spine was performed following the standard protocol without intravenous contrast. Multiplanar CT image reconstructions of the cervical spine were also generated. COMPARISON:  Head CT dated 03/16/2017.  FINDINGS: CT HEAD FINDINGS Brain: Ventricles are stable in size and configuration. Mild chronic small vessel ischemic change noted in the deep periventricular white matter regions. No mass, hemorrhage,  edema or other evidence of acute parenchymal abnormality. No extra-axial hemorrhage. Vascular: Chronic calcified atherosclerotic changes of the large vessels at the skull base. No unexpected hyperdense vessel. Skull: Normal. Negative for fracture or focal lesion. Sinuses/Orbits: No acute finding. Other: Scalp edema overlying the RIGHT frontoparietal bone. No underlying fracture. CT CERVICAL SPINE FINDINGS Alignment: Dextroscoliosis of the cervicothoracic spine, possibly accentuated by patient positioning. No evidence of acute vertebral body subluxation. Skull base and vertebrae: No fracture line or displaced fracture fragment appreciated. Facet joints appear appropriately aligned. Soft tissues and spinal canal: No prevertebral fluid or swelling. No visible canal hematoma. Disc levels: Mild degenerative spondylosis at the C5-6 and C6-7 levels. No significant central canal stenosis at any level. Upper chest: No acute findings. Other: Bilateral carotid atherosclerosis. IMPRESSION: 1. Scalp edema overlying the RIGHT frontoparietal bone. No underlying fracture. 2. No acute intracranial abnormality. No intracranial mass, hemorrhage or edema. No skull fracture. 3. No fracture or acute subluxation within the cervical spine. 4. Carotid atherosclerosis. Electronically Signed   By: Bary Richard M.D.   On: 07/22/2018 16:15   Dg Shoulder Left  Result Date: 07/23/2018 CLINICAL DATA:  Severe LEFT arm pain, multiple recent falls EXAM: LEFT SHOULDER - 2+ VIEW COMPARISON:  12/29/2008 FINDINGS: Osseous demineralization. AC joint alignment normal. No acute fracture, dislocation, or bone destruction.  Fall Visualized ribs intact. IMPRESSION: No acute abnormalities. Electronically Signed   By: Ulyses Southward M.D.   On: 07/23/2018 13:29    Dg Hip Unilat  With Pelvis 2-3 Views Right  Result Date: 07/22/2018 CLINICAL DATA:  Pt to ED with c/o of an unwitnessed fall. Pt is from [facility] facility and was in the shower this morning, pt fell to the bottom of the shower. Staff was not present but heard it, and found her sitting up in the tub. Pt has hx of alz dementia. When [facility] arrived patient was walking around but complaining of right hip and right knee pain 10/10. No deformities present. EXAM: DG HIP (WITH OR WITHOUT PELVIS) 2-3V RIGHT COMPARISON:  None. FINDINGS: No fracture.  No bone lesion. Left hip prosthesis is well seated and aligned. Right hip joint, SI joints and symphysis pubis are normally spaced and aligned. Bones are demineralized. Soft tissues are unremarkable. IMPRESSION: No fracture, dislocation or acute finding. Electronically Signed   By: Amie Portland M.D.   On: 07/22/2018 15:17   Dg Hips Bilat With Pelvis Min 5 Views  Result Date: 07/23/2018 CLINICAL DATA:  Multiple recent falls, BILATERAL hip pain EXAM: DG HIP (WITH OR WITHOUT PELVIS) 5+V BILAT COMPARISON:  07/22/2018 FINDINGS: Osseous demineralization. LEFT hip prosthesis. Minimal narrowing of RIGHT hip joint. SI joints preserved. Nondisplaced fracture of the LEFT inferior pubic ramus. No additional fracture, dislocation, or bone destruction. IMPRESSION: Fracture LEFT inferior pubic ramus. Electronically Signed   By: Ulyses Southward M.D.   On: 07/23/2018 13:37    Micro Results     Recent Results (from the past 240 hour(s))  Urine culture     Status: Abnormal   Collection Time: 07/23/18  4:40 PM  Result Value Ref Range Status   Specimen Description URINE, CATHETERIZED  Final   Special Requests   Final    Normal Performed at [facility], [address] [place], [place] [address]    Culture >=100,000 COLONIES/mL ESCHERICHIA COLI (A)  Final   Report Status 07/26/2018 FINAL  Final   Organism ID, Bacteria ESCHERICHIA COLI (A)  Final       Susceptibility  Escherichia coli - MIC*    AMPICILLIN <=2 SENSITIVE Sensitive     CEFAZOLIN <=4 SENSITIVE Sensitive     CEFTRIAXONE <=1 SENSITIVE Sensitive     CIPROFLOXACIN <=0.25 SENSITIVE Sensitive     GENTAMICIN <=1 SENSITIVE Sensitive     IMIPENEM <=0.25 SENSITIVE Sensitive     NITROFURANTOIN <=16 SENSITIVE Sensitive     TRIMETH/SULFA <=20 SENSITIVE Sensitive     AMPICILLIN/SULBACTAM <=2 SENSITIVE Sensitive     PIP/TAZO <=4 SENSITIVE Sensitive     Extended ESBL NEGATIVE Sensitive     * >=100,000 COLONIES/mL ESCHERICHIA COLI  Surgical pcr screen     Status: None   Collection Time: 07/26/18  1:33 PM  Result Value Ref Range Status   MRSA, PCR NEGATIVE NEGATIVE Final   Staphylococcus aureus NEGATIVE NEGATIVE Final    Comment: (NOTE) The Xpert SA Assay (FDA approved for NASAL specimens in patients 57 years of age and older), is one component of a comprehensive surveillance program. It is not intended to diagnose infection nor to guide or monitor treatment. Performed at [facility], [address], [place], [place] [address]        Today   Subjective:   Heather Floyd today denies any complaint, but caregiver at bedside report patient has significant constipation, no bowel movement for last 5 days, already received Kayexalate to yesterday, and she did receive magnesium citrate today.  Objective:   Blood pressure 132/73, pulse (!) 102, temperature 98.6 F (37 C), temperature source Axillary, resp. rate 16, weight 49 kg, SpO2 91 %.   Intake/Output Summary (Last 24 hours) at 07/29/2018 1401 Last data filed at 07/29/2018 0800 Gross per 24 hour  Intake 360 ml  Output 200 ml  Net 160 ml    Exam  Awake , demented, poor cognition and insight  Symmetrical Chest wall movement, Good air movement bilaterally, CTAB RRR,No Gallops,Rubs or new Murmurs, No Parasternal Heave +ve B.Sounds, Abd Soft, No tenderness, No rebound - guarding or rigidity. No Cyanosis, Clubbing or  edema, No new Rash or bruise    Data Review   CBC w Diff:  Lab Results  Component Value Date   WBC 7.0 07/28/2018   HGB 9.5 (L) 07/28/2018   HCT 28.9 (L) 07/28/2018   PLT 249 07/28/2018   LYMPHOPCT 13 07/28/2018   MONOPCT 10 07/28/2018   EOSPCT 3 07/28/2018   BASOPCT 0 07/28/2018    CMP:  Lab Results  Component Value Date   NA 143 07/28/2018   K 4.0 07/28/2018   CL 109 07/28/2018   CO2 28 07/28/2018   BUN 12 07/28/2018   CREATININE 0.88 07/28/2018   PROT 7.1 07/23/2018   ALBUMIN 2.5 (L) 07/28/2018   BILITOT 0.6 07/23/2018   ALKPHOS 51 07/23/2018   AST 39 07/23/2018   ALT 24 07/23/2018  .   Total Time in preparing paper work, data evaluation and todays exam - 35 minutes  Huey Bienenstock M.D on 07/29/2018 at 2:01 PM  [facility]   Office  [phone]

## 2018-07-29 NOTE — Progress Notes (Addendum)
Patient will DC WU:JWJXBJYNto:[facility] Anticipated DC date:07/29/2018 Family notified:Linda Transport WG:NFAOby:PTAR  Per MD [facility] . RN, patient, patient's family, and facility notified of DC. Discharge Summary sent to facility. RN given number for report [phone] Room 109 . DC packet on chart. Ambulance transport requested for patient.  CSW signing off.  [place]ley Tekeya Ge[place], [place] [phone]

## 2018-07-29 NOTE — Progress Notes (Signed)
PROGRESS NOTE    Heather Floyd  VBT:[id number] DOB: 27-Apr-1933 DOA: 07/23/2018 PCP: Daisy Floro, MD  Outpatient Specialists:   Brief Narrative:  Patient is an 85 year old Caucasian female, past medical history significant for dementia, TIA, restless leg syndrome, hypertension, hyperlipidemia, carotid artery occlusion and abnormal gait.  Patient was admitted following recurrent falls.  Imaging work-up done revealed left elbow (displaced left olecranon) fracture and left inferior pubic ramus fracture.  Patient also has acute kidney injury that is likely prerenal and UTI secondary to E. coli.  Patient underwent ORIF of the left elbow/olecranon fracture and repair of tendon yesterday, 07/26/2018.  Pubic ramus fracture will be managed conservatively.  Plan is to discharge patient to skilled nursing facility when patient is more stable.  Hypokalemia is noted.  We will continue to replete and monitor.  Continue IV antibiotics for UTI after tomorrow's dose.  Acute kidney injury has resolved.   Assessment & Plan:   Principal Problem:   Closed olecranon fracture, left, initial encounter Active Problems:   Memory disturbance   Hyperlipidemia   Hypertension   RLS (restless legs syndrome)   TIA (transient ischemic attack)   Dementia (HCC)   Dehydration   Fall   Acute lower UTI   Closed fracture of left inferior pubic ramus (HCC)   Pubic ramus fracture (HCC)   Rupture of left triceps tendon   Left Elbow fracture -due to fall, Ortho consult greatly appreciated, status post ORIF for left elbow/olecranon fracture and repair of tendon on 07/26/2018 by Dr.Xu -Further management per orthopedic team, pain medicine and DVT prophylaxis -Okay to DC sling  Unsteady gait, with history of multiple falls with resultant left elbow fracture and left inferior pubic ramus fracture:  -PT/OT consulted, recommendation for SNF placement, discussed with social worker  AKI: -creatinine1.75 on admission, most  likely volume depletion, resolved   UTI:  -Urine culture growing E. coli, treated with Rocephin -DC Foley catheter today  Hypokalemia: Continue to replete and monitor. Renal panel and magnesium in a.m.   Hypertension: Continue to optimize.  Hypophosphatemia -Being repleted today with sodium phosphate  Acute  hypoxic respiratory failure  -on Presentation was hypoxic requiring nasal cannula, this has resolved, most likely related to bronchitis  - CXR shows bronchitis changes.  Influenza PCR is negative.  Monitor.   Dementia:  Intermittent agitation since surgery.  DVT prophylaxis: Lovenox.  Code Status: DNR Family Communication:   caregiver at bedside Disposition Plan:  Will need SNF placement Consults called:  Dr Roda Shutters with [facility].   Procedures:   ORIF of left elbow on Monday, 07/26/2018  Antimicrobials:   Continue IV Rocephin (complete dose on 07/28/2018)   Subjective: No significant history from patient.  Objective: Vitals:   07/28/18 1418 07/28/18 2328 07/29/18 0422 07/29/18 0852  BP: 136/73 (!) 145/80 (!) 142/66 132/73  Pulse: 98 (!) 105 (!) 106 (!) 102  Resp:  20 16   Temp: 99.2 F (37.3 C) 99.8 F (37.7 C) 98.7 F (37.1 C) 98.6 F (37 C)  TempSrc: Other (Comment) Oral Oral Axillary  SpO2: 95% 91% 93% 91%  Weight:        Intake/Output Summary (Last 24 hours) at 07/29/2018 1159 Last data filed at 07/29/2018 0800 Gross per 24 hour  Intake 360 ml  Output 200 ml  Net 160 ml   Filed Weights   07/24/18 1800  Weight: 49 kg    Examination:  Awake , demented, poor cognition and insight  Symmetrical Chest wall movement,  Good air movement bilaterally, CTAB RRR,No Gallops,Rubs or new Murmurs, No Parasternal Heave +ve B.Sounds, Abd Soft, No tenderness, No rebound - guarding or rigidity. No Cyanosis, Clubbing or edema, No new Rash or bruise      .  Data Reviewed: I have personally reviewed following labs and imaging  studies  CBC: Recent Labs  Lab 07/23/18 1205 07/25/18 0420 07/27/18 0711 07/28/18 0155  WBC 12.3* 9.8 6.7 7.0  NEUTROABS 11.0* 7.7 5.7 5.1  HGB 12.3 11.4* 10.4* 9.5*  HCT 37.2 32.7* 30.9* 28.9*  MCV 91.4 88.1 86.3 88.4  PLT 188 180 216 249   Basic Metabolic Panel: Recent Labs  Lab 07/23/18 1205 07/25/18 0420 07/27/18 0711 07/28/18 0155  NA 138 139 142 143  K 3.8 3.6 3.0* 4.0  CL 103 107 104 109  CO2 24 22 25 28   GLUCOSE 133* 110* 125* 116*  BUN 37* 17 13 12   CREATININE 1.75* 1.19* 1.02* 0.88  CALCIUM 9.0 8.1* 8.4* 8.3*  MG  --   --  1.9 1.8  PHOS  --  2.0* 3.0 1.8*   GFR: Estimated Creatinine Clearance: 37.5 mL/min (by C-G formula based on SCr of 0.88 mg/dL). Liver Function Tests: Recent Labs  Lab 07/23/18 1205 07/25/18 0420 07/27/18 0711 07/28/18 0155  AST 39  --   --   --   ALT 24  --   --   --   ALKPHOS 51  --   --   --   BILITOT 0.6  --   --   --   PROT 7.1  --   --   --   ALBUMIN 4.2 3.0* 2.8* 2.5*   No results for input(s): LIPASE, AMYLASE in the last 168 hours. No results for input(s): AMMONIA in the last 168 hours. Coagulation Profile: No results for input(s): INR, PROTIME in the last 168 hours. Cardiac Enzymes: No results for input(s): CKTOTAL, CKMB, CKMBINDEX, TROPONINI in the last 168 hours. BNP (last 3 results) No results for input(s): PROBNP in the last 8760 hours. HbA1C: No results for input(s): HGBA1C in the last 72 hours. CBG: No results for input(s): GLUCAP in the last 168 hours. Lipid Profile: No results for input(s): CHOL, HDL, LDLCALC, TRIG, CHOLHDL, LDLDIRECT in the last 72 hours. Thyroid Function Tests: No results for input(s): TSH, T4TOTAL, FREET4, T3FREE, THYROIDAB in the last 72 hours. Anemia Panel: No results for input(s): VITAMINB12, FOLATE, FERRITIN, TIBC, IRON, RETICCTPCT in the last 72 hours. Urine analysis:    Component Value Date/Time   COLORURINE YELLOW 07/23/2018 1640   APPEARANCEUR HAZY (A) 07/23/2018 1640    LABSPEC 1.020 07/23/2018 1640   PHURINE 5.0 07/23/2018 1640   GLUCOSEU NEGATIVE 07/23/2018 1640   HGBUR NEGATIVE 07/23/2018 1640   BILIRUBINUR NEGATIVE 07/23/2018 1640   KETONESUR 5 (A) 07/23/2018 1640   PROTEINUR NEGATIVE 07/23/2018 1640   UROBILINOGEN 1.0 09/30/2014 1211   NITRITE POSITIVE (A) 07/23/2018 1640   LEUKOCYTESUR NEGATIVE 07/23/2018 1640   Sepsis Labs: @LABRCNTIP (procalcitonin:4,lacticidven:4)  ) Recent Results (from the past 240 hour(s))  Urine culture     Status: Abnormal   Collection Time: 07/23/18  4:40 PM  Result Value Ref Range Status   Specimen Description URINE, CATHETERIZED  Final   Special Requests   Final    Normal Performed at [facility], [address], [place], [place] [address]    Culture >=100,000 COLONIES/mL ESCHERICHIA COLI (A)  Final   Report Status 07/26/2018 FINAL  Final   Organism ID, Bacteria  ESCHERICHIA COLI (A)  Final      Susceptibility   Escherichia coli - MIC*    AMPICILLIN <=2 SENSITIVE Sensitive     CEFAZOLIN <=4 SENSITIVE Sensitive     CEFTRIAXONE <=1 SENSITIVE Sensitive     CIPROFLOXACIN <=0.25 SENSITIVE Sensitive     GENTAMICIN <=1 SENSITIVE Sensitive     IMIPENEM <=0.25 SENSITIVE Sensitive     NITROFURANTOIN <=16 SENSITIVE Sensitive     TRIMETH/SULFA <=20 SENSITIVE Sensitive     AMPICILLIN/SULBACTAM <=2 SENSITIVE Sensitive     PIP/TAZO <=4 SENSITIVE Sensitive     Extended ESBL NEGATIVE Sensitive     * >=100,000 COLONIES/mL ESCHERICHIA COLI  Surgical pcr screen     Status: None   Collection Time: 07/26/18  1:33 PM  Result Value Ref Range Status   MRSA, PCR NEGATIVE NEGATIVE Final   Staphylococcus aureus NEGATIVE NEGATIVE Final    Comment: (NOTE) The Xpert SA Assay (FDA approved for NASAL specimens in patients 83 years of age and older), is one component of a comprehensive surveillance program. It is not intended to diagnose infection nor to guide or monitor treatment. Performed at [facility], [address] [place], [place] [address]          Radiology Studies: No results found.      Scheduled Meds: . feeding supplement (ENSURE ENLIVE)  237 mL Oral TID BM  . gabapentin  100 mg Oral TID  . QUEtiapine  50 mg Oral QHS  . senna-docusate  3 tablet Oral BID  . sertraline  100 mg Oral Daily  . traZODone  75 mg Oral QHS   Continuous Infusions: . sodium chloride 75 mL/hr at 07/27/18 1430  . lactated ringers 50 mL/hr at 07/26/18 1353     LOS: 5 days     Huey Bienenstockawood Tila Millirons, MD  [facility]

## 2018-08-10 ENCOUNTER — Encounter (INDEPENDENT_AMBULATORY_CARE_PROVIDER_SITE_OTHER): Payer: Self-pay | Admitting: Orthopaedic Surgery

## 2018-08-10 ENCOUNTER — Ambulatory Visit (INDEPENDENT_AMBULATORY_CARE_PROVIDER_SITE_OTHER): Payer: Medicare Other

## 2018-08-10 ENCOUNTER — Ambulatory Visit (INDEPENDENT_AMBULATORY_CARE_PROVIDER_SITE_OTHER): Payer: Medicare Other | Admitting: Physician Assistant

## 2018-08-10 DIAGNOSIS — M25522 Pain in left elbow: Secondary | ICD-10-CM

## 2018-08-10 NOTE — Progress Notes (Signed)
Post-Op Visit Note   Patient: Heather Floyd           Date of Birth: 12/03/1932           MRN: [id number] Visit Date: 08/10/2018 PCP: Daisy Floro, MD   Assessment & Plan:  Chief Complaint:  Chief Complaint  Patient presents with  . Left Elbow - Pain, Follow-up, Routine Post Op   Visit Diagnoses:  1. Pain in left elbow     Plan: Patient is a pleasant 85 year old female with advanced dementia who comes in today 14 days status post ORIF left elbow olecranon fracture, date of surgery 07/26/2018.  She has been residing at [facility] care center.  She has been compliant in her splint.  Examination of the left elbow reveals a well-healing surgical incision with nylon sutures in place.  Mild swelling.  Fingers are warm and well-perfused.  Today, nylon sutures were removed.  We will place the patient back into a long-arm cast for another 2 weeks.  She will follow-up with [place] at that point where we will likely place her in a sling.  She will be nonweightbearing but we will start with gentle range of motion at that point.  Follow-Up Instructions: Return in about 2 weeks (around 08/24/2018).   Orders:  Orders Placed This Encounter  Procedures  . XR Elbow Complete Left (3+View)   No orders of the defined types were placed in this encounter.   Imaging: Xr Elbow Complete Left (3+view)  Result Date: 08/10/2018 Stable alignment of the fracture.    PMFS History: Patient Active Problem List   Diagnosis Date Noted  . Pain in left elbow 08/10/2018  . Rupture of left triceps tendon 07/26/2018  . Closed olecranon fracture, left, initial encounter 07/24/2018  . Pubic ramus fracture (HCC) 07/24/2018  . Closed fracture of left inferior pubic ramus (HCC)   . Fall 07/23/2018  . Acute lower UTI 07/23/2018  . Palliative care encounter   . Colitis 08/25/2017  . Abdominal pain 04/08/2017  . Leukocytosis 04/08/2017  . Dementia (HCC) 04/08/2017  . Dehydration 04/08/2017  . Abnormal  urinalysis 04/08/2017  . Enteritis 04/07/2017  . Abnormality of gait 09/08/2014  . Concussion with loss of consciousness 09/08/2014  . Subdural hematoma (HCC) 08/11/2014  . Hypokalemia 08/11/2014  . SDH (subdural hematoma) (HCC) 08/11/2014  . Hyperlipidemia   . Hypertension   . RLS (restless legs syndrome)   . DJD (degenerative joint disease) of cervical spine   . TIA (transient ischemic attack)   . Carotid artery occlusion   . Essential hypertension   . Memory disturbance 03/29/2013  . Near syncope 01/13/2013  . Accelerated hypertension 01/13/2013  . Hyperkalemia 01/13/2013   Past Medical History:  Diagnosis Date  . Abnormality of gait 09/08/2014  . Carotid artery occlusion   . Concussion with loss of consciousness 09/08/2014  . DJD (degenerative joint disease) of cervical spine   . Hyperlipidemia   . Hypertension   . Memory disturbance 03/29/2013  . Osteoporosis   . Personal history of colonic polyps   . RLS (restless legs syndrome)   . Sleep disorder   . TIA (transient ischemic attack)   . Varicose veins     Family History  Problem Relation Age of Onset  . Mental illness Mother   . Alzheimer's disease Mother   . Cancer Father   . Bone cancer Father   . Dementia Sister     Past Surgical History:  Procedure Laterality Date  .  ANTERIOR AND POSTERIOR VAGINAL REPAIR W/ SACROSPINOUS LIGAMENT SUSPENSION    . CESAREAN SECTION    . ENDOVENOUS ABLATION SAPHENOUS VEIN W/ LASER    . HEMORRHOID SURGERY    . JOINT REPLACEMENT    . ORIF ELBOW FRACTURE Left 07/26/2018   Procedure: OPEN REDUCTION INTERNAL FIXATION (ORIF) ELBOW/OLECRANON FRACTURE;  Surgeon: Tarry Kos, MD;  Location: [facility] OR;  Service: Orthopedics;  Laterality: Left;  . REDUCTION MAMMAPLASTY    . TOTAL HIP ARTHROPLASTY Left 10/19/2017   Procedure: ANTERIOR HEMI HIP;  Surgeon: Tarry Kos, MD;  Location: [facility] OR;  Service: Orthopedics;  Laterality: Left;   Social History   Occupational History  . Occupation:  retired  Tobacco Use  . Smoking status: Former Smoker    Last attempt to quit: 06/16/1980    Years since quitting: 38.1  . Smokeless tobacco: Never Used  Substance and Sexual Activity  . Alcohol use: No  . Drug use: No  . Sexual activity: Not on file

## 2018-08-24 ENCOUNTER — Ambulatory Visit (INDEPENDENT_AMBULATORY_CARE_PROVIDER_SITE_OTHER): Payer: Medicare Other

## 2018-08-24 ENCOUNTER — Ambulatory Visit (INDEPENDENT_AMBULATORY_CARE_PROVIDER_SITE_OTHER): Payer: Medicare Other | Admitting: Orthopaedic Surgery

## 2018-08-24 DIAGNOSIS — S52022A Displaced fracture of olecranon process without intraarticular extension of left ulna, initial encounter for closed fracture: Secondary | ICD-10-CM

## 2018-08-24 DIAGNOSIS — S52022D Displaced fracture of olecranon process without intraarticular extension of left ulna, subsequent encounter for closed fracture with routine healing: Secondary | ICD-10-CM

## 2018-08-24 NOTE — Progress Notes (Signed)
Post-Op Visit Note   Patient: Heather Floyd           Date of Birth: 1933-05-11           MRN: [id number] Visit Date: 08/24/2018 PCP: Heather Floro, MD   Assessment & Plan:  Chief Complaint:  Chief Complaint  Patient presents with  . Left Elbow - Follow-up   Visit Diagnoses:  1. Closed olecranon fracture, left, initial encounter     Plan: [name] is 1 month status post ORIF left olecranon and triceps advancement.  Overall she is doing okay.  She has severe dementia.  Surgical scars well-healed.  X-rays are demonstrating bridging bony callus formation.  At this point we will discontinue the cast and begin elbow range of motion with OT.  Continue nonweightbearing.  Follow-up in 4 weeks with repeat 2 view x-rays of the left elbow.  Follow-Up Instructions: Return in about 4 weeks (around 09/21/2018).   Orders:  Orders Placed This Encounter  Procedures  . XR Elbow Complete Left (3+View)   No orders of the defined types were placed in this encounter.   Imaging: Xr Elbow Complete Left (3+view)  Result Date: 08/24/2018 There is the appearance of increasing bony consolidation bridging bony callus formation   PMFS History: Patient Active Problem List   Diagnosis Date Noted  . Pain in left elbow 08/10/2018  . Rupture of left triceps tendon 07/26/2018  . Closed olecranon fracture, left, initial encounter 07/24/2018  . Pubic ramus fracture (HCC) 07/24/2018  . Closed fracture of left inferior pubic ramus (HCC)   . Fall 07/23/2018  . Acute lower UTI 07/23/2018  . Palliative care encounter   . Colitis 08/25/2017  . Abdominal pain 04/08/2017  . Leukocytosis 04/08/2017  . Dementia (HCC) 04/08/2017  . Dehydration 04/08/2017  . Abnormal urinalysis 04/08/2017  . Enteritis 04/07/2017  . Abnormality of gait 09/08/2014  . Concussion with loss of consciousness 09/08/2014  . Subdural hematoma (HCC) 08/11/2014  . Hypokalemia 08/11/2014  . SDH (subdural hematoma) (HCC) 08/11/2014  .  Hyperlipidemia   . Hypertension   . RLS (restless legs syndrome)   . DJD (degenerative joint disease) of cervical spine   . TIA (transient ischemic attack)   . Carotid artery occlusion   . Essential hypertension   . Memory disturbance 03/29/2013  . Near syncope 01/13/2013  . Accelerated hypertension 01/13/2013  . Hyperkalemia 01/13/2013   Past Medical History:  Diagnosis Date  . Abnormality of gait 09/08/2014  . Carotid artery occlusion   . Concussion with loss of consciousness 09/08/2014  . DJD (degenerative joint disease) of cervical spine   . Hyperlipidemia   . Hypertension   . Memory disturbance 03/29/2013  . Osteoporosis   . Personal history of colonic polyps   . RLS (restless legs syndrome)   . Sleep disorder   . TIA (transient ischemic attack)   . Varicose veins     Family History  Problem Relation Age of Onset  . Mental illness Mother   . Alzheimer's disease Mother   . Cancer Father   . Bone cancer Father   . Dementia Sister     Past Surgical History:  Procedure Laterality Date  . ANTERIOR AND POSTERIOR VAGINAL REPAIR W/ SACROSPINOUS LIGAMENT SUSPENSION    . CESAREAN SECTION    . ENDOVENOUS ABLATION SAPHENOUS VEIN W/ LASER    . HEMORRHOID SURGERY    . JOINT REPLACEMENT    . ORIF ELBOW FRACTURE Left 07/26/2018   Procedure: OPEN REDUCTION  INTERNAL FIXATION (ORIF) ELBOW/OLECRANON FRACTURE;  Surgeon: Heather Kos, MD;  Location: [facility] OR;  Service: Orthopedics;  Laterality: Left;  . REDUCTION MAMMAPLASTY    . TOTAL HIP ARTHROPLASTY Left 10/19/2017   Procedure: ANTERIOR HEMI HIP;  Surgeon: Heather Kos, MD;  Location: [facility] OR;  Service: Orthopedics;  Laterality: Left;   Social History   Occupational History  . Occupation: retired  Tobacco Use  . Smoking status: Former Smoker    Last attempt to quit: 06/16/1980    Years since quitting: 38.2  . Smokeless tobacco: Never Used  Substance and Sexual Activity  . Alcohol use: No  . Drug use: No  . Sexual activity: Not on  file

## 2018-08-27 ENCOUNTER — Non-Acute Institutional Stay: Payer: Medicare Other | Admitting: Adult Health Nurse Practitioner

## 2018-08-27 DIAGNOSIS — Z515 Encounter for palliative care: Secondary | ICD-10-CM

## 2018-08-28 NOTE — Progress Notes (Signed)
Therapist, nutritional Palliative Care Consult Note Telephone: [phone]  Fax: [phone]  PATIENT NAME: Heather Floyd DOB: 05/05/1933 MRN: [id number]  PRIMARY CARE PROVIDER:   Daisy Floro, MD  REFERRING PROVIDER: [place] Blattenberger NP  RESPONSIBLE PARTY:   Daughter: Tempie Hoist  [phone]     RECOMMENDATIONS and PLAN:  1.  Recurrent falls.  Patient had 2 falls on 07/22/2018 and was seen at ER with no acute findings.  Patient had another fall on 07/23/2018 and was admitted to hospital with a closed fracture of left inferior pubic ramus and left olecranon.  Patient had surgical repair of the left olecranon and cast was taken off earlier this week.  She is still NWB on left arm until cleared by ortho.  Patient was also found to have UTI while in the hospital and was treated for this.  Patient has sitter in the room and states that she is unstable on her feet with the pubic ramus fracture but she is able to walk unassisted to the bathroom and within her room.  She does need stand by assistance walking longer distances. Sitter does state that orthopedist told them that she would be sore with walking until it healed in about 4-8 weeks. Continue PT/OT as ordered.   2. Appetite.  Sitter does state that patient had not been eating until this morning.  She did eat some breakfast this morning and some Ensure.  Patient recently treated for pneumonia and this may have decreased her appetite. Continue to offer nutritional supplements and continue to monitor appetite. Patient was 83 on 2/13 and currently weighs 211.  3.  Anxiety/agitation.  Patient is currently on Seroquel 25mg  daily and hydroxyzine Q 8hrs PRN for anxiety.  Staff does report that she has been getting the hydroxyzine every morning and at bedtime and occasionally one more time through the day. Patient has just been taken off any unnecessary meds as she often refuses or it is difficult to get her to take her  meds.  She is currently only on the seroquel and hydroxyzine.  Continue to monitor for effectiveness.  May benefit with the addition of Buspar or zoloft if continues to have agitation.  Daughter is concerned that she will not be able to return to [facility] if she continues to have agitation.  Daughter does state that she can be agitated based on how she is approached.  Patient was calm with provider today and did not display any agitation.   4.  Advanced care planning.  DNR is in place. Daughter states that she is the POA and that she has a living will in place.  Daughter states that years ago her mother filled out the paperwork to have her body donated to science and funeral arrangements preplanned.  I spent 60 minutes providing this consultation,  from 11:00 to 12:00. More than 50% of the time in this consultation was spent coordinating communication.   HISTORY OF PRESENT ILLNESS:  Heather Floyd is a 85 y.o. year old female with multiple medical problems including dementia, HTN, DJD, h/o TIA, HLD. Palliative Care was asked to help address goals of care.   CODE STATUS: DNR  PPS: 60% HOSPICE ELIGIBILITY/DIAGNOSIS: TBD  PHYSICAL EXAM:   General: NAD, frail appearing, thin Cardiovascular: regular rate and rhythm Pulmonary: lung sounds clear with normal respiratory effort Abdomen: soft, nontender, + bowel sounds GU: no suprapubic tenderness Extremities: no edema, no joint deformities.  Patient does have limited ROM  of left arm and is NWB until cleared by ortho Skin: no rashes Neurological: Weakness, A&O to self only  PAST MEDICAL HISTORY:  Past Medical History:  Diagnosis Date   Abnormality of gait 09/08/2014   Carotid artery occlusion    Concussion with loss of consciousness 09/08/2014   DJD (degenerative joint disease) of cervical spine    Hyperlipidemia    Hypertension    Memory disturbance 03/29/2013   Osteoporosis    Personal history of colonic polyps    RLS  (restless legs syndrome)    Sleep disorder    TIA (transient ischemic attack)    Varicose veins     SOCIAL HX:  Social History   Tobacco Use   Smoking status: Former Smoker    Last attempt to quit: 06/16/1980    Years since quitting: 38.2   Smokeless tobacco: Never Used  Substance Use Topics   Alcohol use: No    ALLERGIES:  Allergies  Allergen Reactions   Aspirin Other (See Comments)    Makes blood too thin   Codeine Nausea Only     PERTINENT MEDICATIONS:  Outpatient Encounter Medications as of 08/27/2018  Medication Sig   acetaminophen (TYLENOL) 160 MG/5ML solution Take 15.6 mLs (500 mg total) by mouth every 6 (six) hours as needed for mild pain.   amLODipine (NORVASC) 5 MG tablet Take 5 mg by mouth daily.   bisacodyl (DULCOLAX) 5 MG EC tablet Take 1 tablet (5 mg total) by mouth daily as needed for moderate constipation.   feeding supplement, ENSURE ENLIVE, (ENSURE ENLIVE) LIQD Take 237 mLs by mouth 3 (three) times daily between meals.   gabapentin (NEURONTIN) 100 MG capsule Take 100 mg by mouth 3 (three) times daily.   hydrOXYzine (ATARAX/VISTARIL) 25 MG tablet Take 1 tablet (25 mg total) by mouth 3 (three) times daily as needed.   oxyCODONE (OXY IR/ROXICODONE) 5 MG immediate release tablet Take 1 tablet (5 mg total) by mouth every 6 (six) hours as needed for severe pain.   polyethylene glycol (MIRALAX) packet Take 17 g by mouth daily as needed for moderate constipation.   QUEtiapine (SEROQUEL) 50 MG tablet Take 1 tablet (50 mg total) by mouth at bedtime.   senna-docusate (SENOKOT-S) 8.6-50 MG tablet Take 3 tablets by mouth 2 (two) times daily.   sertraline (ZOLOFT) 100 MG tablet Take 1 tablet (100 mg total) by mouth daily.   traZODone (DESYREL) 50 MG tablet Take 1.5 tablets (75 mg total) by mouth at bedtime.   No facility-administered encounter medications on file as of 08/27/2018.       Chett Taniguchi Marlena Clipper, NP

## 2018-08-30 ENCOUNTER — Other Ambulatory Visit: Payer: Self-pay

## 2018-09-01 ENCOUNTER — Non-Acute Institutional Stay: Payer: Medicare Other | Admitting: Adult Health Nurse Practitioner

## 2018-09-01 ENCOUNTER — Other Ambulatory Visit: Payer: Self-pay

## 2018-09-01 DIAGNOSIS — Z515 Encounter for palliative care: Secondary | ICD-10-CM

## 2018-09-01 NOTE — Progress Notes (Signed)
Therapist, nutritional Palliative Care Consult Note Telephone: [phone]  Fax: [phone]  PATIENT NAME: Heather Floyd DOB: Apr 24, 1933 MRN: [id number]  PRIMARY CARE PROVIDER:   Daisy Floro, MD  REFERRING PROVIDER:  [place] Blattenberger NP  RESPONSIBLE PARTY:   Daughter: Tempie Hoist  [phone]      RECOMMENDATIONS and PLAN:  1.  Recurrent falls.  Patient has not had any falls while at facility.  Sitter does state that she is unsteady on her feet when she first gets up but she is able to ambulate small distances in her room. Did see patient get up while visiting and she was unstable on her feet but she took short steps to get to the bed to her chair.  With her advanced dementia, not sure if she would remember to use or how to use a walker.    2.  Appetite.  Patient still has poor appetite.  Sitter states that she mostly drinks Ensure.  On 07/23/2018 albumin was 4.2 and total protein 7.1.  Recommend weekly weights and repeat albumin and total protein to monitor for protein calorie malnutrition  3.  Anxiety/agitation.  Sitter reports that patient does get "nasty" around 2:00pm when she starts sundowning.  Staff reports that they do not see any difference in her behaviors. Patient is calm with provider. Of note provider has visited twice in the morning and has not seen her in the afternoon when she is sundowning.  Patient currently on seroquel and hydroxyzine.  The hydroxyzine is PRN.  Would recommend giving it at around 1:00 or 1:30 to see if it helps with her sundown behaviors.  A small dose of Ativan given at this time may help with behaviors associated with sundowning if the hydroxyzine does not work.  Ativan gel might be effective as she is at times resistant to taking her meds  4. Advanced care planning.  DNR is in place. Daughter states that she is the POA and that she has a living will in place.  Daughter states that years ago her mother filled out the  paperwork to have her body donated to science and funeral arrangements preplanned.  Spoke with staff who state that at discharge, [facility], where patient was a resident, will not be taking her back due to her repeated falls.  They are in the process of looking for facilities with [facility] units that will take the patient.  Left message with daughter.  I spent 45 minutes providing this consultation,  from 10:00 to 10:45. More than 50% of the time in this consultation was spent coordinating communication.   HISTORY OF PRESENT ILLNESS:  Heather Floyd is a 85 y.o. year old female with multiple medical problems including dementia, HTN, DJD, h/o TIA, HLD. Palliative Care was asked to help address goals of care.   CODE STATUS: DNR  PPS: 60% HOSPICE ELIGIBILITY/DIAGNOSIS: TBD  PHYSICAL EXAM:   General: NAD, frail appearing, thin Cardiovascular: regular rate and rhythm Pulmonary: lung sounds clear with normal respiratory effort Abdomen: soft, nontender, + bowel sounds GU: no suprapubic tenderness Extremities: no edema, no joint deformities.  Patient does have limited ROM of left arm and is NWB until cleared by ortho Skin: no rashes Neurological: Weakness, A&O to self only  PAST MEDICAL HISTORY:  Past Medical History:  Diagnosis Date  . Abnormality of gait 09/08/2014  . Carotid artery occlusion   . Concussion with loss of consciousness 09/08/2014  . DJD (degenerative joint disease) of cervical  spine   . Hyperlipidemia   . Hypertension   . Memory disturbance 03/29/2013  . Osteoporosis   . Personal history of colonic polyps   . RLS (restless legs syndrome)   . Sleep disorder   . TIA (transient ischemic attack)   . Varicose veins     SOCIAL HX:  Social History   Tobacco Use  . Smoking status: Former Smoker    Last attempt to quit: 06/16/1980    Years since quitting: 38.2  . Smokeless tobacco: Never Used  Substance Use Topics  . Alcohol use: No    ALLERGIES:  Allergies  Allergen  Reactions  . Aspirin Other (See Comments)    Makes blood too thin  . Codeine Nausea Only     PERTINENT MEDICATIONS:  Outpatient Encounter Medications as of 09/01/2018  Medication Sig  . acetaminophen (TYLENOL) 160 MG/5ML solution Take 15.6 mLs (500 mg total) by mouth every 6 (six) hours as needed for mild pain.  Marland Kitchen amLODipine (NORVASC) 5 MG tablet Take 5 mg by mouth daily.  . bisacodyl (DULCOLAX) 5 MG EC tablet Take 1 tablet (5 mg total) by mouth daily as needed for moderate constipation.  . feeding supplement, ENSURE ENLIVE, (ENSURE ENLIVE) LIQD Take 237 mLs by mouth 3 (three) times daily between meals.  . gabapentin (NEURONTIN) 100 MG capsule Take 100 mg by mouth 3 (three) times daily.  . hydrOXYzine (ATARAX/VISTARIL) 25 MG tablet Take 1 tablet (25 mg total) by mouth 3 (three) times daily as needed.  Marland Kitchen oxyCODONE (OXY IR/ROXICODONE) 5 MG immediate release tablet Take 1 tablet (5 mg total) by mouth every 6 (six) hours as needed for severe pain.  . polyethylene glycol (MIRALAX) packet Take 17 g by mouth daily as needed for moderate constipation.  . QUEtiapine (SEROQUEL) 50 MG tablet Take 1 tablet (50 mg total) by mouth at bedtime.  . senna-docusate (SENOKOT-S) 8.6-50 MG tablet Take 3 tablets by mouth 2 (two) times daily.  . sertraline (ZOLOFT) 100 MG tablet Take 1 tablet (100 mg total) by mouth daily.  . traZODone (DESYREL) 50 MG tablet Take 1.5 tablets (75 mg total) by mouth at bedtime.   No facility-administered encounter medications on file as of 09/01/2018.       Tannie Koskela Marlena Clipper, NP

## 2018-09-09 ENCOUNTER — Other Ambulatory Visit: Payer: Self-pay

## 2018-09-09 ENCOUNTER — Non-Acute Institutional Stay: Payer: Medicare Other | Admitting: Adult Health Nurse Practitioner

## 2018-09-09 DIAGNOSIS — Z515 Encounter for palliative care: Secondary | ICD-10-CM

## 2018-09-09 NOTE — Progress Notes (Signed)
Therapist, nutritional Palliative Care Consult Note Telephone: [phone]  Fax: [phone]  PATIENT NAME: Heather Floyd DOB: 08-29-1932 MRN: [id number]  PRIMARY CARE PROVIDER:   Daisy Floro, MD  REFERRING PROVIDER:  [place] Blattenberger NP  RESPONSIBLE PARTY:   Daughter: Tempie Hoist [phone]     RECOMMENDATIONS and PLAN:  1. Recurrent falls.  Patient has not had any falls while at facility.  Watched patient ambulate today in the hallway without assistive devices with sitter.  She is somewhat unstable on her feet but is able to walk a few steps before stopping.  Sometimes she grabs onto desk at nursing station or guard rail  2.  Appetite.  Patient still has poor appetite.  Staff and sitter do state that her appetite is getting a little better.  She will eat a little bit at breakfast and then not much for the rest of the day.  She is able to feed herself  3. Advanced care planning. DNR is in place. Daughter states that she is the POA and that she has a living will in place. Daughter states that years ago her mother filled out the paperwork to have her body donated to science and funeral arrangements preplanned.  Patient does seem to be perking up and is more alert today than I have seen her.  She is calm and a bit sassy when interacting with staff and provider today.  I spent 15 minutes providing this consultation,  from 1:50 to 2:05. More than 50% of the time in this consultation was spent coordinating communication.   HISTORY OF PRESENT ILLNESS:  Heather Floyd is a 85 y.o. year old female with multiple medical problems including dementia, HTN, DJD, h/o TIA, HLD. Palliative Care was asked to help address goals of care.   CODE STATUS: DNR  PPS: 60% HOSPICE ELIGIBILITY/DIAGNOSIS: TBD  PHYSICAL EXAM:   General: NAD, frail appearing, thin Extremities: no edema, no joint deformities.   Skin: no rashes Neurological: Weakness, A&O to self only   PAST MEDICAL HISTORY:  Past Medical History:  Diagnosis Date  . Abnormality of gait 09/08/2014  . Carotid artery occlusion   . Concussion with loss of consciousness 09/08/2014  . DJD (degenerative joint disease) of cervical spine   . Hyperlipidemia   . Hypertension   . Memory disturbance 03/29/2013  . Osteoporosis   . Personal history of colonic polyps   . RLS (restless legs syndrome)   . Sleep disorder   . TIA (transient ischemic attack)   . Varicose veins     SOCIAL HX:  Social History   Tobacco Use  . Smoking status: Former Smoker    Last attempt to quit: 06/16/1980    Years since quitting: 38.2  . Smokeless tobacco: Never Used  Substance Use Topics  . Alcohol use: No    ALLERGIES:  Allergies  Allergen Reactions  . Aspirin Other (See Comments)    Makes blood too thin  . Codeine Nausea Only     PERTINENT MEDICATIONS:  Outpatient Encounter Medications as of 09/09/2018  Medication Sig  . acetaminophen (TYLENOL) 160 MG/5ML solution Take 15.6 mLs (500 mg total) by mouth every 6 (six) hours as needed for mild pain.  Marland Kitchen amLODipine (NORVASC) 5 MG tablet Take 5 mg by mouth daily.  . bisacodyl (DULCOLAX) 5 MG EC tablet Take 1 tablet (5 mg total) by mouth daily as needed for moderate constipation.  . feeding supplement, ENSURE ENLIVE, (ENSURE ENLIVE) LIQD Take 237  mLs by mouth 3 (three) times daily between meals.  . gabapentin (NEURONTIN) 100 MG capsule Take 100 mg by mouth 3 (three) times daily.  . hydrOXYzine (ATARAX/VISTARIL) 25 MG tablet Take 1 tablet (25 mg total) by mouth 3 (three) times daily as needed.  Marland Kitchen oxyCODONE (OXY IR/ROXICODONE) 5 MG immediate release tablet Take 1 tablet (5 mg total) by mouth every 6 (six) hours as needed for severe pain.  . polyethylene glycol (MIRALAX) packet Take 17 g by mouth daily as needed for moderate constipation.  . QUEtiapine (SEROQUEL) 50 MG tablet Take 1 tablet (50 mg total) by mouth at bedtime.  . senna-docusate (SENOKOT-S) 8.6-50 MG  tablet Take 3 tablets by mouth 2 (two) times daily.  . sertraline (ZOLOFT) 100 MG tablet Take 1 tablet (100 mg total) by mouth daily.  . traZODone (DESYREL) 50 MG tablet Take 1.5 tablets (75 mg total) by mouth at bedtime.   No facility-administered encounter medications on file as of 09/09/2018.       Yazan Gatling Marlena Clipper, NP

## 2018-09-21 ENCOUNTER — Ambulatory Visit (INDEPENDENT_AMBULATORY_CARE_PROVIDER_SITE_OTHER): Payer: Medicare Other | Admitting: Physician Assistant

## 2018-11-24 ENCOUNTER — Encounter (HOSPITAL_COMMUNITY): Payer: Self-pay

## 2018-11-24 ENCOUNTER — Emergency Department (HOSPITAL_COMMUNITY)
Admission: EM | Admit: 2018-11-24 | Discharge: 2018-11-24 | Disposition: A | Discharge status: Home / Self Care | Payer: Medicare Other | Attending: Emergency Medicine | Admitting: Emergency Medicine

## 2018-11-24 ENCOUNTER — Emergency Department (HOSPITAL_COMMUNITY): Payer: Medicare Other

## 2018-11-24 ENCOUNTER — Other Ambulatory Visit: Payer: Self-pay

## 2018-11-24 DIAGNOSIS — W19XXXA Unspecified fall, initial encounter: Secondary | ICD-10-CM | POA: Insufficient documentation

## 2018-11-24 DIAGNOSIS — Z8673 Personal history of transient ischemic attack (TIA), and cerebral infarction without residual deficits: Secondary | ICD-10-CM | POA: Diagnosis not present

## 2018-11-24 DIAGNOSIS — Z79899 Other long term (current) drug therapy: Secondary | ICD-10-CM | POA: Diagnosis not present

## 2018-11-24 DIAGNOSIS — M545 Low back pain: Secondary | ICD-10-CM | POA: Diagnosis present

## 2018-11-24 DIAGNOSIS — I10 Essential (primary) hypertension: Secondary | ICD-10-CM | POA: Diagnosis not present

## 2018-11-24 DIAGNOSIS — Y92199 Unspecified place in other specified residential institution as the place of occurrence of the external cause: Secondary | ICD-10-CM | POA: Insufficient documentation

## 2018-11-24 DIAGNOSIS — Z87891 Personal history of nicotine dependence: Secondary | ICD-10-CM | POA: Diagnosis not present

## 2018-11-24 DIAGNOSIS — N3000 Acute cystitis without hematuria: Secondary | ICD-10-CM | POA: Insufficient documentation

## 2018-11-24 DIAGNOSIS — Y999 Unspecified external cause status: Secondary | ICD-10-CM | POA: Insufficient documentation

## 2018-11-24 DIAGNOSIS — Z96642 Presence of left artificial hip joint: Secondary | ICD-10-CM | POA: Diagnosis not present

## 2018-11-24 DIAGNOSIS — Y939 Activity, unspecified: Secondary | ICD-10-CM | POA: Diagnosis not present

## 2018-11-24 DIAGNOSIS — F0390 Unspecified dementia without behavioral disturbance: Secondary | ICD-10-CM | POA: Insufficient documentation

## 2018-11-24 LAB — BASIC METABOLIC PANEL
Anion gap: 12 (ref 5–15)
BUN: 27 mg/dL — ABNORMAL HIGH (ref 8–23)
CO2: 28 mmol/L (ref 22–32)
Calcium: 9.4 mg/dL (ref 8.9–10.3)
Chloride: 99 mmol/L (ref 98–111)
Creatinine, Ser: 1.07 mg/dL — ABNORMAL HIGH (ref 0.44–1.00)
GFR calc Af Amer: 56 mL/min — ABNORMAL LOW (ref >60)
GFR calc non Af Amer: 48 mL/min — ABNORMAL LOW (ref >60)
Glucose, Bld: 111 mg/dL — ABNORMAL HIGH (ref 70–99)
Potassium: 4.1 mmol/L (ref 3.5–5.1)
Sodium: 139 mmol/L (ref 135–145)

## 2018-11-24 LAB — CBC
HCT: 39.2 % (ref 36.0–46.0)
Hemoglobin: 12.8 g/dL (ref 12.0–15.0)
MCH: 29.8 pg (ref 26.0–34.0)
MCHC: 32.7 g/dL (ref 30.0–36.0)
MCV: 91.4 fL (ref 80.0–100.0)
Platelets: 228 10*3/uL (ref 150–400)
RBC: 4.29 MIL/uL (ref 3.87–5.11)
RDW: 13.1 % (ref 11.5–15.5)
WBC: 10.5 10*3/uL (ref 4.0–10.5)
nRBC: 0 % (ref 0.0–0.2)

## 2018-11-24 LAB — URINALYSIS, ROUTINE W REFLEX MICROSCOPIC
Bilirubin Urine: NEGATIVE
Glucose, UA: NEGATIVE mg/dL
Hgb urine dipstick: NEGATIVE
Ketones, ur: NEGATIVE mg/dL
Nitrite: NEGATIVE
Protein, ur: NEGATIVE mg/dL
Specific Gravity, Urine: 1.008 (ref 1.005–1.030)
pH: 7 (ref 5.0–8.0)

## 2018-11-24 MED ORDER — SODIUM CHLORIDE 0.9 % IV BOLUS (SEPSIS): 500.0000 mL | Freq: Once | INTRAVENOUS | Status: DC

## 2018-11-24 MED ORDER — CEPHALEXIN 250 MG PO CAPS: 250.0000 mg | ORAL_CAPSULE | Freq: Three times a day (TID) | ORAL | 0 refills | Status: AC

## 2018-11-24 MED ORDER — CEPHALEXIN 250 MG PO CAPS: 250.0000 mg | ORAL_CAPSULE | Freq: Three times a day (TID) | ORAL | 0 refills | Status: DC

## 2018-11-24 MED ORDER — SODIUM CHLORIDE 0.9 % IV SOLN: 1000.0000 mL | INTRAVENOUS | Status: DC

## 2018-11-24 MED ORDER — ACETAMINOPHEN 325 MG PO TABS: 325.0000 mg | ORAL_TABLET | Freq: Four times a day (QID) | ORAL | 0 refills | Status: AC | PRN

## 2018-11-24 MED ORDER — CEPHALEXIN 250 MG PO CAPS
250.0000 mg | ORAL_CAPSULE | Freq: Once | ORAL | Status: AC
  Administered 2018-11-24: 250 mg via ORAL
  Filled 2018-11-24: qty 1

## 2018-11-24 MED ORDER — ACETAMINOPHEN 325 MG PO TABS: 325.0000 mg | ORAL_TABLET | Freq: Four times a day (QID) | ORAL | 0 refills | Status: DC | PRN

## 2018-11-24 NOTE — ED Notes (Signed)
Urine and culture sent to lab  

## 2018-11-24 NOTE — ED Notes (Signed)
Bed: TW44 Expected date:  Expected time:  Means of arrival:  Comments: EMS- 85yo F, fall x2

## 2018-11-24 NOTE — Discharge Instructions (Addendum)
Take the medications as prescribed, follow-up with your primary care doctor.  Consider stopping the tramadol medication as it could be causing some of the balance issues.

## 2018-11-24 NOTE — ED Triage Notes (Signed)
Per ems: Pt coming from [facility] after having 2 non witnessed fall today. Hematoma noted to posterior head and c/o lower back pain. Hx of dementia and at baseline per facility.   Daughter at bedside due to pt dementia.

## 2018-11-24 NOTE — ED Notes (Signed)
Pt's daughter verbalized discharge instructions and follow up care. Alert at baseline and ambulatory. Assisted into daughter's vehicle. No iv

## 2018-11-24 NOTE — ED Provider Notes (Signed)
[facility]-EMERGENCY DEPT Provider Note   CSN: [id number] Arrival date & time: 11/24/18  2015    History   Chief Complaint Chief Complaint  Patient presents with  . Fall    HPI Heather Floyd is a 85 y.o. female.     HPI DAugher is at the bedside to assist in providing history.  Pt resides at a nursing memory care unit.  Pt had a fall over the weekend.  Family tried to get a doctor to evaluate her at the facility.  They were not able to get to her until later this week.  Pt had an additional fall today.  She was complaining of pain in her lower back.  DAughter was concerned she might have a uti as this has caused her these symptoms in the past.  No vomiting or diarrhea.  No fevers or chills.  No known LOC.  No CP or SOB.  Hx is limited though as pt is not able to provide much history herself. Past Medical History:  Diagnosis Date  . Abnormality of gait 09/08/2014  . Carotid artery occlusion   . Concussion with loss of consciousness 09/08/2014  . DJD (degenerative joint disease) of cervical spine   . Hyperlipidemia   . Hypertension   . Memory disturbance 03/29/2013  . Osteoporosis   . Personal history of colonic polyps   . RLS (restless legs syndrome)   . Sleep disorder   . TIA (transient ischemic attack)   . Varicose veins     Patient Active Problem List   Diagnosis Date Noted  . Pain in left elbow 08/10/2018  . Rupture of left triceps tendon 07/26/2018  . Closed olecranon fracture, left, initial encounter 07/24/2018  . Pubic ramus fracture (HCC) 07/24/2018  . Closed fracture of left inferior pubic ramus (HCC)   . Fall 07/23/2018  . Acute lower UTI 07/23/2018  . Palliative care encounter   . Colitis 08/25/2017  . Abdominal pain 04/08/2017  . Leukocytosis 04/08/2017  . Dementia (HCC) 04/08/2017  . Dehydration 04/08/2017  . Abnormal urinalysis 04/08/2017  . Enteritis 04/07/2017  . Abnormality of gait 09/08/2014  . Concussion with loss of  consciousness 09/08/2014  . Subdural hematoma (HCC) 08/11/2014  . Hypokalemia 08/11/2014  . SDH (subdural hematoma) (HCC) 08/11/2014  . Hyperlipidemia   . Hypertension   . RLS (restless legs syndrome)   . DJD (degenerative joint disease) of cervical spine   . TIA (transient ischemic attack)   . Carotid artery occlusion   . Essential hypertension   . Memory disturbance 03/29/2013  . Near syncope 01/13/2013  . Accelerated hypertension 01/13/2013  . Hyperkalemia 01/13/2013    Past Surgical History:  Procedure Laterality Date  . ANTERIOR AND POSTERIOR VAGINAL REPAIR W/ SACROSPINOUS LIGAMENT SUSPENSION    . CESAREAN SECTION    . ENDOVENOUS ABLATION SAPHENOUS VEIN W/ LASER    . HEMORRHOID SURGERY    . JOINT REPLACEMENT    . ORIF ELBOW FRACTURE Left 07/26/2018   Procedure: OPEN REDUCTION INTERNAL FIXATION (ORIF) ELBOW/OLECRANON FRACTURE;  Surgeon: Tarry KosXu, Naiping M, MD;  Location: [facility] OR;  Service: Orthopedics;  Laterality: Left;  . REDUCTION MAMMAPLASTY    . TOTAL HIP ARTHROPLASTY Left 10/19/2017   Procedure: ANTERIOR HEMI HIP;  Surgeon: Tarry KosXu, Naiping M, MD;  Location: [facility] OR;  Service: Orthopedics;  Laterality: Left;     OB History   No obstetric history on file.      Home Medications    Prior to Admission  medications   Medication Sig Start Date End Date Taking? Authorizing Provider  amLODipine (NORVASC) 5 MG tablet Take 5 mg by mouth daily. 02/01/18  Yes [provider]  bisacodyl (DULCOLAX) 5 MG EC tablet Take 1 tablet (5 mg total) by mouth daily as needed for moderate constipation. 07/29/18  Yes Elgergawy, Silver Huguenin, MD  feeding supplement, ENSURE ENLIVE, (ENSURE ENLIVE) LIQD Take 237 mLs by mouth 3 (three) times daily between meals. 07/29/18  Yes Elgergawy, Silver Huguenin, MD  gabapentin (NEURONTIN) 100 MG capsule Take 100 mg by mouth 3 (three) times daily. 03/30/18  Yes [provider]  QUEtiapine (SEROQUEL) 50 MG tablet Take 1 tablet (50 mg total) by mouth at bedtime.  04/07/18  Yes Ward Givens, NP  sertraline (ZOLOFT) 100 MG tablet Take 1 tablet (100 mg total) by mouth daily. 09/21/17  Yes Ward Givens, NP  traMADol (ULTRAM) 50 MG tablet Take 50 mg by mouth at bedtime as needed for moderate pain.   Yes [provider]  traZODone (DESYREL) 50 MG tablet Take 1.5 tablets (75 mg total) by mouth at bedtime. Patient taking differently: Take 75 mg by mouth at bedtime as needed for sleep.  07/02/18  Yes Ward Givens, NP  acetaminophen (TYLENOL) 325 MG tablet Take 1 tablet (325 mg total) by mouth every 6 (six) hours as needed for up to 7 days. 11/24/18 12/01/18  Dorie Rank, MD  cephALEXin (KEFLEX) 250 MG capsule Take 1 capsule (250 mg total) by mouth 3 (three) times daily for 7 days. 11/24/18 12/01/18  Dorie Rank, MD  hydrOXYzine (ATARAX/VISTARIL) 25 MG tablet Take 1 tablet (25 mg total) by mouth 3 (three) times daily as needed. Patient not taking: Reported on 11/24/2018 07/02/18   Ward Givens, NP  oxyCODONE (OXY IR/ROXICODONE) 5 MG immediate release tablet Take 1 tablet (5 mg total) by mouth every 6 (six) hours as needed for severe pain. Patient not taking: Reported on 11/24/2018 07/29/18   Elgergawy, Silver Huguenin, MD  polyethylene glycol [facility]) packet Take 17 g by mouth daily as needed for moderate constipation. Patient not taking: Reported on 11/24/2018 07/29/18   Elgergawy, Silver Huguenin, MD  senna-docusate (SENOKOT-S) 8.6-50 MG tablet Take 3 tablets by mouth 2 (two) times daily. Patient not taking: Reported on 11/24/2018 07/29/18   Elgergawy, Silver Huguenin, MD    Family History Family History  Problem Relation Age of Onset  . Mental illness Mother   . Alzheimer's disease Mother   . Cancer Father   . Bone cancer Father   . Dementia Sister     Social History Social History   Tobacco Use  . Smoking status: Former Smoker    Last attempt to quit: 06/16/1980    Years since quitting: 38.4  . Smokeless tobacco: Never Used  Substance Use Topics  . Alcohol use:  No  . Drug use: No     Allergies   Aspirin and Codeine   Review of Systems Review of Systems  All other systems reviewed and are negative.    Physical Exam Updated Vital Signs BP 140/76 (BP Location: Left Arm)   Pulse 75   Temp 98.3 F (36.8 C) (Oral)   Resp 18   SpO2 100%   Physical Exam Vitals signs and nursing note reviewed.  Constitutional:      Appearance: She is well-developed.     Comments: Elderly frail  HENT:     Head: Normocephalic and atraumatic.     Right Ear: External ear normal.  Left Ear: External ear normal.  Eyes:     General: No scleral icterus.       Right eye: No discharge.        Left eye: No discharge.     Conjunctiva/sclera: Conjunctivae normal.  Neck:     Musculoskeletal: Neck supple.     Trachea: No tracheal deviation.  Cardiovascular:     Rate and Rhythm: Normal rate and regular rhythm.  Pulmonary:     Effort: Pulmonary effort is normal. No respiratory distress.     Breath sounds: Normal breath sounds. No stridor. No wheezing or rales.  Abdominal:     General: Bowel sounds are normal. There is no distension.     Palpations: Abdomen is soft.     Tenderness: There is no abdominal tenderness. There is no guarding or rebound.  Genitourinary:    Comments: Incontinent of brown stool Musculoskeletal:        General: Tenderness (mild lumbar spine) present.  Skin:    General: Skin is warm and dry.     Findings: No rash.  Neurological:     Mental Status: She is alert.     Cranial Nerves: No cranial nerve deficit (no facial droop, extraocular movements intact, no slurred speech).     Sensory: No sensory deficit.     Motor: Weakness present. No abnormal muscle tone or seizure activity.     Coordination: Coordination normal.     Comments: Moves all extremities, pt does not want to comply with full exam, difficult to assess strength      ED Treatments / Results  Labs (all labs ordered are listed, but only abnormal results are  displayed) Labs Reviewed  BASIC METABOLIC PANEL - Abnormal; Notable for the following components:      Result Value   Glucose, Bld 111 (*)    BUN 27 (*)    Creatinine, Ser 1.07 (*)    GFR calc non Af Amer 48 (*)    GFR calc Af Amer 56 (*)    All other components within normal limits  URINALYSIS, ROUTINE W REFLEX MICROSCOPIC - Abnormal; Notable for the following components:   Color, Urine STRAW (*)    Leukocytes,Ua SMALL (*)    Bacteria, UA RARE (*)    All other components within normal limits  CBC    EKG None  Radiology Dg Lumbar Spine Complete  Result Date: 11/24/2018 CLINICAL DATA:  Initial evaluation for acute trauma, fall. EXAM: LUMBAR SPINE - COMPLETE 4+ VIEW COMPARISON:  Prior CT from 08/25/2017 FINDINGS: Five non rib-bearing lumbar type vertebral bodies. Mild dextroscoliosis. Trace grade 1 anterolisthesis of L4 on L5, with trace retrolisthesis of L3 on L4, stable. Alignment otherwise normal. Mild compression deformity at the superior endplate of L2, stable and chronic in nature. Vertebral body heights otherwise maintained. No acute fracture. Age-indeterminate fracture of the left inferior pubic ramus, favored to be at least subacute to chronic in nature. Visualized bony pelvis otherwise intact. Visualized left hip arthroplasty in place. Visualized soft tissues within normal limits. Aortic atherosclerosis. IMPRESSION: 1. No radiographic evidence for acute traumatic injury within the lumbar spine. 2. Mild chronic L2 compression deformity, stable. 3. Age-indeterminate fracture of the left inferior pubic ramus, favored to be at least subacute to chronic in nature. Correlation with physical exam recommended. Electronically Signed   By: Rise MuBenjamin  McClintock M.D.   On: 11/24/2018 21:55   Dg Pelvis 1-2 Views  Result Date: 11/24/2018 CLINICAL DATA:  Initial evaluation for acute trauma, fall. EXAM:  PELVIS - 1-2 VIEW COMPARISON:  Prior radiograph from 10/19/2017 FINDINGS: Left hip  arthroplasty in place. No visible hardware complication. Cortical irregularity at the inferior left pubic ramus, age indeterminate, but favored to be chronic. Bony pelvis otherwise intact. No abnormality about the right hip. SI joints approximated. No pubic diastasis. Degenerative changes noted within lower lumbar spine. No soft tissue abnormality. IMPRESSION: 1. Cortical irregularity at the inferior left pubic ramus, age indeterminate, but favored to be chronic in nature. Correlation with physical exam recommended. 2. Left hip arthroplasty in place without complication. Electronically Signed   By: Rise MuBenjamin  McClintock M.D.   On: 11/24/2018 21:46   Ct Head Wo Contrast  Result Date: 11/24/2018 CLINICAL DATA:  Head injury after unwitnessed falls. EXAM: CT HEAD WITHOUT CONTRAST TECHNIQUE: Contiguous axial images were obtained from the base of the skull through the vertex without intravenous contrast. COMPARISON:  CT scan of July 23, 2018. FINDINGS: Brain: Mild diffuse cortical atrophy is noted. Mild chronic ischemic white matter disease is noted. No mass effect or midline shift is noted. Ventricular size is within normal limits. There is no evidence of mass lesion, hemorrhage or acute infarction. Vascular: No hyperdense vessel or unexpected calcification. Skull: Normal. Negative for fracture or focal lesion. Sinuses/Orbits: No acute finding. Other: Small right posterior scalp hematoma is noted. IMPRESSION: Small right posterior scalp hematoma. Mild diffuse cortical atrophy. Mild chronic ischemic white matter disease. No acute intracranial abnormality seen. Electronically Signed   By: Lupita RaiderJames  Green Jr M.D.   On: 11/24/2018 21:36    Procedures Procedures (including critical care time)  Medications Ordered in ED Medications  sodium chloride 0.9 % bolus 500 mL (500 mLs Intravenous Refused 11/24/18 2137)    Followed by  0.9 %  sodium chloride infusion (1,000 mLs Intravenous Refused 11/24/18 2138)  cephALEXin  (KEFLEX) capsule 250 mg (has no administration in time range)     Initial Impression / Assessment and Plan / ED Course  I have reviewed the triage vital signs and the nursing notes.  Pertinent labs & imaging results that were available during my care of the patient were reviewed by me and considered in my medical decision making (see chart for details).   Patient presented the ED after a fall over the weekend.  Patient's daughter was concerned about possible urinary tract infection.  Patient's laboratory tests show normal CBC.  Electrolyte panel is unremarkable.  Urinalysis does suggest the possibility of UTI although not definitive.  X-rays do show evidence of old fractures but no definite acute fracture.  Family states she did have a prior pelvic fracture so I suspect this is nonacute.  Plan on discharge home with course of antibiotics.  Patient's daughter noted that she is on tramadol and in the past this is going some issues with her balance.  I will have her discontinue that.  Final Clinical Impressions(s) / ED Diagnoses   Final diagnoses:  Fall, initial encounter  Acute cystitis without hematuria    ED Discharge Orders         Ordered    cephALEXin (KEFLEX) 250 MG capsule  3 times daily     11/24/18 2321    acetaminophen (TYLENOL) 325 MG tablet  Every 6 hours PRN     11/24/18 2321           Linwood DibblesKnapp, Roberts Bon, MD 11/24/18 2326

## 2018-11-24 NOTE — ED Notes (Signed)
Pt transported to XR.  

## 2018-11-24 NOTE — ED Notes (Signed)
Daughter stated she would prefer her mother not get an IV and only get blood work done

## 2018-11-28 ENCOUNTER — Emergency Department (HOSPITAL_COMMUNITY)
Admission: EM | Admit: 2018-11-28 | Discharge: 2018-11-29 | Disposition: A | Discharge status: Home / Self Care | Payer: Medicare Other | Attending: Emergency Medicine | Admitting: Emergency Medicine

## 2018-11-28 ENCOUNTER — Other Ambulatory Visit: Payer: Self-pay

## 2018-11-28 ENCOUNTER — Encounter (HOSPITAL_COMMUNITY): Payer: Self-pay | Admitting: Emergency Medicine

## 2018-11-28 DIAGNOSIS — Z87891 Personal history of nicotine dependence: Secondary | ICD-10-CM | POA: Insufficient documentation

## 2018-11-28 DIAGNOSIS — Y92129 Unspecified place in nursing home as the place of occurrence of the external cause: Secondary | ICD-10-CM | POA: Insufficient documentation

## 2018-11-28 DIAGNOSIS — Z8673 Personal history of transient ischemic attack (TIA), and cerebral infarction without residual deficits: Secondary | ICD-10-CM | POA: Insufficient documentation

## 2018-11-28 DIAGNOSIS — Y939 Activity, unspecified: Secondary | ICD-10-CM | POA: Insufficient documentation

## 2018-11-28 DIAGNOSIS — S0990XA Unspecified injury of head, initial encounter: Secondary | ICD-10-CM

## 2018-11-28 DIAGNOSIS — I10 Essential (primary) hypertension: Secondary | ICD-10-CM | POA: Diagnosis not present

## 2018-11-28 DIAGNOSIS — Y999 Unspecified external cause status: Secondary | ICD-10-CM | POA: Insufficient documentation

## 2018-11-28 DIAGNOSIS — W19XXXA Unspecified fall, initial encounter: Secondary | ICD-10-CM | POA: Insufficient documentation

## 2018-11-28 DIAGNOSIS — M25552 Pain in left hip: Secondary | ICD-10-CM | POA: Insufficient documentation

## 2018-11-28 DIAGNOSIS — M25551 Pain in right hip: Secondary | ICD-10-CM | POA: Insufficient documentation

## 2018-11-28 DIAGNOSIS — Z96642 Presence of left artificial hip joint: Secondary | ICD-10-CM | POA: Insufficient documentation

## 2018-11-28 NOTE — ED Triage Notes (Signed)
Patient BIB [facility] from [facility] for unwitnessed fall this evening. Pt c/o pain to posterior head- two hematomas noted, lower back pain likely related to previous fall on 6/10. No new complaints. Hx of dementia, at baseline per facility. Facility was unable to contact pts POA

## 2018-11-29 ENCOUNTER — Emergency Department (HOSPITAL_COMMUNITY): Payer: Medicare Other

## 2018-11-29 NOTE — ED Provider Notes (Signed)
Emergency Department Provider Note   I have reviewed the triage vital signs and the nursing notes.   HISTORY  Chief Complaint Fall   HPI Heather Floyd is a 85 y.o. female with PMH of Dementia, HLD, HTN, and TIA presents to the emergency department for evaluation after an unwitnessed fall.  [facility] is from [facility].  She was found with 2 posterior scalp hematomas without laceration or bleeding.  She is complaining of some lower back discomfort but had a previous fall 5 days ago and pain was present at that time.  Level 5 caveat applies the patient's underlying dementia.  She is unable to give me information regarding her fall. Unable to contact POA.    Past Medical History:  Diagnosis Date   Abnormality of gait 09/08/2014   Carotid artery occlusion    Concussion with loss of consciousness 09/08/2014   DJD (degenerative joint disease) of cervical spine    Hyperlipidemia    Hypertension    Memory disturbance 03/29/2013   Osteoporosis    Personal history of colonic polyps    RLS (restless legs syndrome)    Sleep disorder    TIA (transient ischemic attack)    Varicose veins     Patient Active Problem List   Diagnosis Date Noted   Pain in left elbow 08/10/2018   Rupture of left triceps tendon 07/26/2018   Closed olecranon fracture, left, initial encounter 07/24/2018   Pubic ramus fracture (HCC) 07/24/2018   Closed fracture of left inferior pubic ramus (HCC)    Fall 07/23/2018   Acute lower UTI 07/23/2018   Palliative care encounter    Colitis 08/25/2017   Abdominal pain 04/08/2017   Leukocytosis 04/08/2017   Dementia (HCC) 04/08/2017   Dehydration 04/08/2017   Abnormal urinalysis 04/08/2017   Enteritis 04/07/2017   Abnormality of gait 09/08/2014   Concussion with loss of consciousness 09/08/2014   Subdural hematoma (HCC) 08/11/2014   Hypokalemia 08/11/2014   SDH (subdural hematoma) (HCC) 08/11/2014    Hyperlipidemia    Hypertension    RLS (restless legs syndrome)    DJD (degenerative joint disease) of cervical spine    TIA (transient ischemic attack)    Carotid artery occlusion    Essential hypertension    Memory disturbance 03/29/2013   Near syncope 01/13/2013   Accelerated hypertension 01/13/2013   Hyperkalemia 01/13/2013    Past Surgical History:  Procedure Laterality Date   ANTERIOR AND POSTERIOR VAGINAL REPAIR W/ SACROSPINOUS LIGAMENT SUSPENSION     CESAREAN SECTION     ENDOVENOUS ABLATION SAPHENOUS VEIN W/ LASER     HEMORRHOID SURGERY     JOINT REPLACEMENT     ORIF ELBOW FRACTURE Left 07/26/2018   Procedure: OPEN REDUCTION INTERNAL FIXATION (ORIF) ELBOW/OLECRANON FRACTURE;  Surgeon: Tarry KosXu, Naiping M, MD;  Location: [facility] OR;  Service: Orthopedics;  Laterality: Left;   REDUCTION MAMMAPLASTY     TOTAL HIP ARTHROPLASTY Left 10/19/2017   Procedure: ANTERIOR HEMI HIP;  Surgeon: Tarry KosXu, Naiping M, MD;  Location: [facility] OR;  Service: Orthopedics;  Laterality: Left;    Allergies Aspirin and Codeine  Family History  Problem Relation Age of Onset   Mental illness Mother    Alzheimer's disease Mother    Cancer Father    Bone cancer Father    Dementia Sister     Social History Social History   Tobacco Use   Smoking status: Former Smoker    Quit date: 06/16/1980    Years since quitting: 2138.4  Smokeless tobacco: Never Used  Substance Use Topics   Alcohol use: No   Drug use: No    Review of Systems  Level 5 caveat:   ____________________________________________   PHYSICAL EXAM:  VITAL SIGNS: ED Triage Vitals  Enc Vitals Group     BP 11/29/18 0006 (!) 126/97     Pulse Rate 11/29/18 0006 71     Resp 11/29/18 0006 18     Temp 11/29/18 0006 98 F (36.7 C)     Temp Source 11/29/18 0006 Oral     SpO2 11/28/18 2354 94 %   Constitutional: Alert but confused. Well appearing and in no acute distress. Eyes: Conjunctivae are normal.  Head: 2 cm  hematoma to the posterior scalp without laceration.  Nose: No congestion/rhinnorhea. Mouth/Throat: Mucous membranes are moist.  Neck: No stridor. C collar in place.  Cardiovascular: Normal rate, regular rhythm. Good peripheral circulation. Grossly normal heart sounds.   Respiratory: Normal respiratory effort. No retractions. Lungs CTAB. Gastrointestinal: Soft and nontender. No distention.  Musculoskeletal: Pain with passive ROM of the bilateral hips. No midline thoracic/lumbar tenderness.  Neurologic:  Normal speech and language.  Skin:  Skin is warm, dry and intact. No rash noted.  ____________________________________________  RADIOLOGY  Ct Head Wo Contrast  Result Date: 11/29/2018 CLINICAL DATA:  85 year old post unwitnessed fall. Head trauma, minor, GCS>=13, high clinical risk, initial exam; C-spine trauma, high clinical risk (NEXUS/CCR) EXAM: CT HEAD WITHOUT CONTRAST CT CERVICAL SPINE WITHOUT CONTRAST TECHNIQUE: Multidetector CT imaging of the head and cervical spine was performed following the standard protocol without intravenous contrast. Multiplanar CT image reconstructions of the cervical spine were also generated. COMPARISON:  Head CT 11/24/2018, head and cervical spine CT 07/23/2018 FINDINGS: CT HEAD FINDINGS Brain: Unchanged degree of atrophy and chronic small vessel ischemia. No intracranial hemorrhage, mass effect, or midline shift. No hydrocephalus. The basilar cisterns are patent. No evidence of territorial infarct or acute ischemia. No extra-axial or intracranial fluid collection. Vascular: Atherosclerosis of skullbase vasculature without hyperdense vessel or abnormal calcification. Skull: No fracture or focal lesion. Sinuses/Orbits: Paranasal sinuses and mastoid air cells are clear. The visualized orbits are unremarkable. Bilateral cataract resection. Other: Decreasing size of right parietal scalp hematoma from prior. CT CERVICAL SPINE FINDINGS Alignment: No traumatic subluxation.  Minimal anterolisthesis of C4 on C5 is unchanged from prior and degenerative. Skull base and vertebrae: No acute fracture. Vertebral body heights are maintained. The dens and skull base are intact. Soft tissues and spinal canal: No prevertebral fluid or swelling. No visible canal hematoma. Disc levels: Disc space narrowing and endplate spurring is most prominent at C5-C6 and C6-C7. Multilevel facet hypertrophy. Upper chest: Mild biapical pleuroparenchymal scarring. Other: Carotid calcifications. IMPRESSION: 1. No acute intracranial abnormality. No skull fracture. Stable atrophy and chronic small vessel ischemia. 2. Multilevel degenerative change in the cervical spine without acute fracture or subluxation. 3. Carotid and skullbase atherosclerosis. Electronically Signed   By: Narda RutherfordMelanie  Sanford M.D.   On: 11/29/2018 02:54   Ct Cervical Spine Wo Contrast  Result Date: 11/29/2018 CLINICAL DATA:  85 year old post unwitnessed fall. Head trauma, minor, GCS>=13, high clinical risk, initial exam; C-spine trauma, high clinical risk (NEXUS/CCR) EXAM: CT HEAD WITHOUT CONTRAST CT CERVICAL SPINE WITHOUT CONTRAST TECHNIQUE: Multidetector CT imaging of the head and cervical spine was performed following the standard protocol without intravenous contrast. Multiplanar CT image reconstructions of the cervical spine were also generated. COMPARISON:  Head CT 11/24/2018, head and cervical spine CT 07/23/2018 FINDINGS: CT HEAD FINDINGS Brain: Unchanged  degree of atrophy and chronic small vessel ischemia. No intracranial hemorrhage, mass effect, or midline shift. No hydrocephalus. The basilar cisterns are patent. No evidence of territorial infarct or acute ischemia. No extra-axial or intracranial fluid collection. Vascular: Atherosclerosis of skullbase vasculature without hyperdense vessel or abnormal calcification. Skull: No fracture or focal lesion. Sinuses/Orbits: Paranasal sinuses and mastoid air cells are clear. The visualized  orbits are unremarkable. Bilateral cataract resection. Other: Decreasing size of right parietal scalp hematoma from prior. CT CERVICAL SPINE FINDINGS Alignment: No traumatic subluxation. Minimal anterolisthesis of C4 on C5 is unchanged from prior and degenerative. Skull base and vertebrae: No acute fracture. Vertebral body heights are maintained. The dens and skull base are intact. Soft tissues and spinal canal: No prevertebral fluid or swelling. No visible canal hematoma. Disc levels: Disc space narrowing and endplate spurring is most prominent at C5-C6 and C6-C7. Multilevel facet hypertrophy. Upper chest: Mild biapical pleuroparenchymal scarring. Other: Carotid calcifications. IMPRESSION: 1. No acute intracranial abnormality. No skull fracture. Stable atrophy and chronic small vessel ischemia. 2. Multilevel degenerative change in the cervical spine without acute fracture or subluxation. 3. Carotid and skullbase atherosclerosis. Electronically Signed   By: Keith Rake M.D.   On: 11/29/2018 02:54   Dg Chest Portable 1 View  Result Date: 11/29/2018 CLINICAL DATA:  Fall EXAM: PORTABLE CHEST 1 VIEW COMPARISON:  07/23/2018 FINDINGS: Low lung volumes. Bibasilar opacities, likely atelectasis. Heart is normal size. No visible effusions or pneumothorax. No visible rib fractures. IMPRESSION: Low lung volumes, bibasilar atelectasis. Electronically Signed   By: Rolm Baptise M.D.   On: 11/29/2018 01:20   Dg Hips Bilat W Or Wo Pelvis 3-4 Views  Result Date: 11/29/2018 CLINICAL DATA:  Unwitnessed fall evening. EXAM: DG HIP (WITH OR WITHOUT PELVIS) 3-4V BILAT COMPARISON:  Radiographs 07/23/2018 FINDINGS: Left hip arthroplasty in expected alignment. No periprosthetic lucency or fracture. Prior left inferior pubic ramus fracture has healed. Left acetabular fracture appears remote with cortical thickening. No new or acute fracture. Right femoral head is seated. Pubic symphysis and sacroiliac joints are congruent.  IMPRESSION: No acute pelvic or hip fracture. Electronically Signed   By: Keith Rake M.D.   On: 11/29/2018 03:01    ____________________________________________   PROCEDURES  Procedure(s) performed:   Procedures  None  ____________________________________________   INITIAL IMPRESSION / ASSESSMENT AND PLAN / ED COURSE  Pertinent labs & imaging results that were available during my care of the patient were reviewed by me and considered in my medical decision making (see chart for details).   Patient presents to the emergency department for evaluation of unwitnessed fall.  She has multiple falls at her facility.  Dementia complicating history.  Level 5 caveat applies.  Plan for CT imaging of the head and cervical spine.  C-collar in place.  Will obtain plain films of the bilateral hips along with portable chest given the unwitnessed nature of the falls.  Patient with normal range of motion of her upper extremities without pain.   CT imaging reviewed with no acute findings.  Patient stable for discharge back to facility.  ____________________________________________  FINAL CLINICAL IMPRESSION(S) / ED DIAGNOSES  Final diagnoses:  Fall, initial encounter  Injury of head, initial encounter  Hip pain, bilateral    Note:  This document was prepared using Dragon voice recognition software and may include unintentional dictation errors.  Nanda Quinton, MD Emergency Medicine    Sharrod Achille, [facility], MD 11/29/18 [phone]

## 2018-11-29 NOTE — Discharge Instructions (Signed)
You have been seen in the Emergency Department (ED) today for a fall.  Your work up does not show any concerning injuries.  Please take over-the-counter Tylenol as needed for your pain (unless you have an allergy or your doctor as told you not to take them), or take any prescribed medication as instructed. ° °Please follow up with your doctor regarding today's Emergency Department (ED) visit and your recent fall.   ° °Return to the ED if you have any headache, confusion, slurred speech, weakness/numbness of any arm or leg, or any increased pain. ° °

## 2018-11-29 NOTE — ED Notes (Signed)
Attempted to call facility do give report, no answer.  [facility] called for transportation.

## 2018-12-13 ENCOUNTER — Ambulatory Visit: Payer: Medicare Other | Admitting: Adult Health

## 2018-12-14 ENCOUNTER — Ambulatory Visit: Payer: Medicare Other | Admitting: Adult Health

## 2019-01-22 ENCOUNTER — Encounter (HOSPITAL_COMMUNITY): Payer: Self-pay | Admitting: Emergency Medicine

## 2019-01-22 ENCOUNTER — Emergency Department (HOSPITAL_COMMUNITY)
Admission: EM | Admit: 2019-01-22 | Discharge: 2019-01-22 | Disposition: A | Discharge status: Home / Self Care | Payer: Medicare Other | Attending: Emergency Medicine | Admitting: Emergency Medicine

## 2019-01-22 ENCOUNTER — Emergency Department (HOSPITAL_COMMUNITY): Payer: Medicare Other

## 2019-01-22 DIAGNOSIS — Z87891 Personal history of nicotine dependence: Secondary | ICD-10-CM | POA: Diagnosis not present

## 2019-01-22 DIAGNOSIS — Z79899 Other long term (current) drug therapy: Secondary | ICD-10-CM | POA: Diagnosis not present

## 2019-01-22 DIAGNOSIS — S0990XA Unspecified injury of head, initial encounter: Secondary | ICD-10-CM | POA: Diagnosis present

## 2019-01-22 DIAGNOSIS — Z8673 Personal history of transient ischemic attack (TIA), and cerebral infarction without residual deficits: Secondary | ICD-10-CM | POA: Insufficient documentation

## 2019-01-22 DIAGNOSIS — I10 Essential (primary) hypertension: Secondary | ICD-10-CM | POA: Insufficient documentation

## 2019-01-22 DIAGNOSIS — E785 Hyperlipidemia, unspecified: Secondary | ICD-10-CM | POA: Insufficient documentation

## 2019-01-22 DIAGNOSIS — S0001XA Abrasion of scalp, initial encounter: Secondary | ICD-10-CM | POA: Diagnosis not present

## 2019-01-22 DIAGNOSIS — Z96642 Presence of left artificial hip joint: Secondary | ICD-10-CM | POA: Diagnosis not present

## 2019-01-22 DIAGNOSIS — Y939 Activity, unspecified: Secondary | ICD-10-CM | POA: Insufficient documentation

## 2019-01-22 DIAGNOSIS — Z885 Allergy status to narcotic agent status: Secondary | ICD-10-CM | POA: Insufficient documentation

## 2019-01-22 DIAGNOSIS — S0003XA Contusion of scalp, initial encounter: Secondary | ICD-10-CM

## 2019-01-22 DIAGNOSIS — W51XXXA Accidental striking against or bumped into by another person, initial encounter: Secondary | ICD-10-CM | POA: Diagnosis not present

## 2019-01-22 DIAGNOSIS — Y999 Unspecified external cause status: Secondary | ICD-10-CM | POA: Diagnosis not present

## 2019-01-22 DIAGNOSIS — Y92129 Unspecified place in nursing home as the place of occurrence of the external cause: Secondary | ICD-10-CM | POA: Diagnosis not present

## 2019-01-22 DIAGNOSIS — F0390 Unspecified dementia without behavioral disturbance: Secondary | ICD-10-CM | POA: Diagnosis not present

## 2019-01-22 MED ORDER — LORAZEPAM 2 MG/ML IJ SOLN
1.0000 mg | Freq: Once | INTRAMUSCULAR | Status: DC
  Filled 2019-01-22: qty 1

## 2019-01-22 MED ORDER — MORPHINE SULFATE (PF) 4 MG/ML IV SOLN: 4.0000 mg | Freq: Once | INTRAVENOUS | Status: DC

## 2019-01-22 MED ORDER — OXYCODONE HCL 5 MG PO TABS
5.0000 mg | ORAL_TABLET | Freq: Once | ORAL | Status: AC
  Administered 2019-01-22: 5 mg via ORAL
  Filled 2019-01-22: qty 1

## 2019-01-22 MED ORDER — TRAMADOL HCL 50 MG PO TABS: 50.0000 mg | ORAL_TABLET | Freq: Every day | ORAL | 0 refills | Status: DC

## 2019-01-22 NOTE — ED Notes (Signed)
Unable to obtain vitals at the moment because the patient is very anxious

## 2019-01-22 NOTE — ED Notes (Signed)
Pt has tried to leave the room a couple times now and her daughter and I have had to verbally convince her to go back into her room. She's getting increasingly anxious. I told the daughter the results from the CT were not back yet but that I would let the EDP know about the increased anxiety and desire to leave.  Message sent to Dr.Yao.
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
## 2019-01-22 NOTE — Discharge Instructions (Addendum)
Continue your current meds. Continue tylenol as prescribed   I refilled her tramadol for pain   See your doctor  Return to ER if you have worse headaches, vomiting, lethargy
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
## 2019-05-04 ENCOUNTER — Emergency Department (HOSPITAL_COMMUNITY)
Admission: EM | Admit: 2019-05-04 | Discharge: 2019-05-04 | Disposition: A | Discharge status: Home / Self Care | Payer: Medicare Other | Attending: Emergency Medicine | Admitting: Emergency Medicine

## 2019-05-04 ENCOUNTER — Other Ambulatory Visit: Payer: Self-pay

## 2019-05-04 ENCOUNTER — Emergency Department (HOSPITAL_COMMUNITY): Payer: Medicare Other

## 2019-05-04 DIAGNOSIS — M25561 Pain in right knee: Secondary | ICD-10-CM | POA: Diagnosis not present

## 2019-05-04 DIAGNOSIS — F0390 Unspecified dementia without behavioral disturbance: Secondary | ICD-10-CM | POA: Diagnosis not present

## 2019-05-04 DIAGNOSIS — Z87891 Personal history of nicotine dependence: Secondary | ICD-10-CM | POA: Diagnosis not present

## 2019-05-04 DIAGNOSIS — M25562 Pain in left knee: Secondary | ICD-10-CM | POA: Insufficient documentation

## 2019-05-04 DIAGNOSIS — Z79899 Other long term (current) drug therapy: Secondary | ICD-10-CM | POA: Insufficient documentation

## 2019-05-04 DIAGNOSIS — M25551 Pain in right hip: Secondary | ICD-10-CM | POA: Insufficient documentation

## 2019-05-04 DIAGNOSIS — W19XXXA Unspecified fall, initial encounter: Secondary | ICD-10-CM | POA: Insufficient documentation

## 2019-05-04 DIAGNOSIS — I10 Essential (primary) hypertension: Secondary | ICD-10-CM | POA: Diagnosis not present

## 2019-05-04 DIAGNOSIS — Z8673 Personal history of transient ischemic attack (TIA), and cerebral infarction without residual deficits: Secondary | ICD-10-CM | POA: Insufficient documentation

## 2019-05-04 NOTE — ED Notes (Signed)
X-ray at bedside

## 2019-05-04 NOTE — ED Notes (Signed)
MD Messick at bedside

## 2019-05-04 NOTE — ED Notes (Signed)
This RN called [facility] to give report to [place]   [phone]

## 2019-05-04 NOTE — ED Notes (Signed)
Pt assisted to RR wheelchair

## 2019-05-04 NOTE — ED Notes (Signed)
Pt. Documented in error see above not in chart.

## 2019-05-04 NOTE — ED Triage Notes (Signed)
BIB EMS from [facility]. Unwitnessed fall found on knees. CC of right hip pain no deformities noted no blood thinners.  Able to stand and pivot with ems to stretcher. Hx of dementia. Baseline orientation per facility.    148/72 66 99% 98.7

## 2019-05-04 NOTE — ED Notes (Signed)
Pt continues to remain combative and refuse vitals machine. Daughter at bedside. Will Continue to monitor

## 2019-05-04 NOTE — ED Notes (Addendum)
Attempted vital signs pt. became agitated and cursing,"you bitch," you don't care, take this thing off my arm." pt. Blood pressure cuff removed. Pt. Given warm blankets for comfort. RN, Corey Skains made aware.

## 2019-05-04 NOTE — ED Notes (Signed)
Pt daughter at bedside. Pt expresses need to void. This RN offered bed ban and external urinal cathter but pt refused and was combative. The pts daughter also denied the use of bedpan or cathater stating "she cannot and will not use those she needs to walk to the bathroom" .  Pt is admitted for fall and possible hip/knee injury. Waiting for MD to assess further. Will continue to monitor.

## 2019-05-04 NOTE — ED Provider Notes (Signed)
[facility]-EMERGENCY DEPT Provider Note   CSN: [id number] Arrival date & time: 05/04/19  1943     History   Chief Complaint Chief Complaint  Patient presents with  . Fall    Hip/knee pain    HPI Heather Floyd is a 85 y.o. female.     85 year old female with prior medical history as detailed below presents for evaluation following reported fall.  Patient is accompanied by her daughter.  Patient with longstanding history of dementia.  She is unable to provide significant history.  History is provided by daughter.  Daughter is reporting that the patient fell earlier today at her facility.  She had a witnessed mechanical fall and landed hard on both knees.  Patient has been ambulatory since the fall.  She did not hit her head.  She did not injure her other extremities.  She is currently at her baseline mental status.  The daughter is requesting that we obtain x-rays of her knees and hips to make sure that there is no broken bones.  The history is provided by the patient, medical records and a relative.  Fall This is a new problem. The current episode started 3 to 5 hours ago. The problem occurs constantly. The problem has not changed since onset.Pertinent negatives include no chest pain, no abdominal pain, no headaches and no shortness of breath. Nothing aggravates the symptoms. Nothing relieves the symptoms.    Past Medical History:  Diagnosis Date  . Abnormality of gait 09/08/2014  . Carotid artery occlusion   . Concussion with loss of consciousness 09/08/2014  . DJD (degenerative joint disease) of cervical spine   . Hyperlipidemia   . Hypertension   . Memory disturbance 03/29/2013  . Osteoporosis   . Personal history of colonic polyps   . RLS (restless legs syndrome)   . Sleep disorder   . TIA (transient ischemic attack)   . Varicose veins     Patient Active Problem List   Diagnosis Date Noted  . Pain in left elbow 08/10/2018  . Rupture of left  triceps tendon 07/26/2018  . Closed olecranon fracture, left, initial encounter 07/24/2018  . Pubic ramus fracture (HCC) 07/24/2018  . Closed fracture of left inferior pubic ramus (HCC)   . Fall 07/23/2018  . Acute lower UTI 07/23/2018  . Palliative care encounter   . Colitis 08/25/2017  . Abdominal pain 04/08/2017  . Leukocytosis 04/08/2017  . Dementia (HCC) 04/08/2017  . Dehydration 04/08/2017  . Abnormal urinalysis 04/08/2017  . Enteritis 04/07/2017  . Abnormality of gait 09/08/2014  . Concussion with loss of consciousness 09/08/2014  . Subdural hematoma (HCC) 08/11/2014  . Hypokalemia 08/11/2014  . SDH (subdural hematoma) (HCC) 08/11/2014  . Hyperlipidemia   . Hypertension   . RLS (restless legs syndrome)   . DJD (degenerative joint disease) of cervical spine   . TIA (transient ischemic attack)   . Carotid artery occlusion   . Essential hypertension   . Memory disturbance 03/29/2013  . Near syncope 01/13/2013  . Accelerated hypertension 01/13/2013  . Hyperkalemia 01/13/2013    Past Surgical History:  Procedure Laterality Date  . ANTERIOR AND POSTERIOR VAGINAL REPAIR W/ SACROSPINOUS LIGAMENT SUSPENSION    . CESAREAN SECTION    . ENDOVENOUS ABLATION SAPHENOUS VEIN W/ LASER    . HEMORRHOID SURGERY    . JOINT REPLACEMENT    . ORIF ELBOW FRACTURE Left 07/26/2018   Procedure: OPEN REDUCTION INTERNAL FIXATION (ORIF) ELBOW/OLECRANON FRACTURE;  Surgeon: Tarry Kos,  MD;  Location: [facility];  Service: Orthopedics;  Laterality: Left;  . REDUCTION MAMMAPLASTY    . TOTAL HIP ARTHROPLASTY Left 10/19/2017   Procedure: ANTERIOR HEMI HIP;  Surgeon: Leandrew Koyanagi, MD;  Location: [place];  Service: Orthopedics;  Laterality: Left;     OB History   No obstetric history on file.      Home Medications    Prior to Admission medications   Medication Sig Start Date End Date Taking? Authorizing Provider  acetaminophen (TYLENOL) 325 MG tablet Take 650 mg by mouth every 6 (six) hours as  needed for mild pain or headache.    [provider]  amLODipine (NORVASC) 5 MG tablet Take 5 mg by mouth at bedtime.  02/01/18   [provider]  bisacodyl (DULCOLAX) 5 MG EC tablet Take 1 tablet (5 mg total) by mouth daily as needed for moderate constipation. Patient not taking: Reported on 01/22/2019 07/29/18   Elgergawy, Silver Huguenin, MD  Cholecalciferol (VITAMIN D3) 125 MCG (5000 UT) CAPS Take 1 capsule by mouth every 7 (seven) days.    [provider]  feeding supplement, ENSURE ENLIVE, (ENSURE ENLIVE) LIQD Take 237 mLs by mouth 3 (three) times daily between meals. Patient not taking: Reported on 11/29/2018 07/29/18   Elgergawy, Silver Huguenin, MD  gabapentin (NEURONTIN) 100 MG capsule Take 100 mg by mouth 3 (three) times daily. 03/30/18   [provider]  hydrOXYzine (ATARAX/VISTARIL) 25 MG tablet Take 1 tablet (25 mg total) by mouth 3 (three) times daily as needed. Patient not taking: Reported on 01/22/2019 07/02/18   Ward Givens, NP  oxyCODONE (OXY IR/ROXICODONE) 5 MG immediate release tablet Take 1 tablet (5 mg total) by mouth every 6 (six) hours as needed for severe pain. Patient not taking: Reported on 11/24/2018 07/29/18   Elgergawy, Silver Huguenin, MD  polyethylene glycol [facility]) packet Take 17 g by mouth daily as needed for moderate constipation. Patient not taking: Reported on 01/22/2019 07/29/18   Elgergawy, Silver Huguenin, MD  QUEtiapine (SEROQUEL) 25 MG tablet Take 25 mg by mouth 2 (two) times daily. 12/25/18   [provider]  senna-docusate (SENOKOT-S) 8.6-50 MG tablet Take 3 tablets by mouth 2 (two) times daily. Patient not taking: Reported on 11/24/2018 07/29/18   Elgergawy, Silver Huguenin, MD  sertraline (ZOLOFT) 100 MG tablet Take 1 tablet (100 mg total) by mouth daily. 09/21/17   Ward Givens, NP  traMADol (ULTRAM) 50 MG tablet Take 1 tablet (50 mg total) by mouth at bedtime. 01/22/19   Drenda Freeze, MD  traZODone (DESYREL) 50 MG tablet Take 1.5 tablets (75 mg  total) by mouth at bedtime. 07/02/18   Ward Givens, NP    Family History Family History  Problem Relation Age of Onset  . Mental illness Mother   . Alzheimer's disease Mother   . Cancer Father   . Bone cancer Father   . Dementia Sister     Social History Social History   Tobacco Use  . Smoking status: Former Smoker    Quit date: 06/16/1980    Years since quitting: 38.9  . Smokeless tobacco: Never Used  Substance Use Topics  . Alcohol use: No  . Drug use: No     Allergies   Aspirin and Codeine   Review of Systems Review of Systems  Unable to perform ROS: Dementia  Respiratory: Negative for shortness of breath.   Cardiovascular: Negative for chest pain.  Gastrointestinal: Negative for abdominal pain.  Neurological: Negative for headaches.  Physical Exam Updated Vital Signs Pulse 72   Resp 16   SpO2 96%   Physical Exam Vitals signs and nursing note reviewed.  Constitutional:      General: She is not in acute distress.    Appearance: Normal appearance. She is well-developed.  HENT:     Head: Normocephalic and atraumatic.  Eyes:     Conjunctiva/sclera: Conjunctivae normal.     Pupils: Pupils are equal, round, and reactive to light.  Neck:     Musculoskeletal: Normal range of motion and neck supple.  Cardiovascular:     Rate and Rhythm: Normal rate and regular rhythm.     Heart sounds: Normal heart sounds.  Pulmonary:     Effort: Pulmonary effort is normal. No respiratory distress.     Breath sounds: Normal breath sounds.  Abdominal:     General: There is no distension.     Palpations: Abdomen is soft.     Tenderness: There is no abdominal tenderness.  Musculoskeletal: Normal range of motion.        General: No deformity.  Skin:    General: Skin is warm and dry.  Neurological:     General: No focal deficit present.     Mental Status: She is alert. Mental status is at baseline.      ED Treatments / Results  Labs (all labs ordered are  listed, but only abnormal results are displayed) Labs Reviewed - No data to display  EKG None  Radiology Dg Pelvis 1-2 Views  Result Date: 05/04/2019 CLINICAL DATA:  Fall, pain on the right EXAM: PELVIS - 1-2 VIEW COMPARISON:  11/24/2018 FINDINGS: Prior left hip replacement. Early degenerative changes in the right hip with early joint space narrowing and spurring. SI joints symmetric. Old left inferior pubic ramus fracture. No acute fracture, subluxation or dislocation. IMPRESSION: No acute bony abnormality. Electronically Signed   By: Charlett NoseKevin  Dover M.D.   On: 05/04/2019 21:21   Dg Knee 2 Views Left  Result Date: 05/04/2019 CLINICAL DATA:  Fall EXAM: LEFT KNEE - 1-2 VIEW COMPARISON:  08/11/2014 FINDINGS: No evidence of fracture, dislocation, or joint effusion. No evidence of arthropathy or other focal bone abnormality. Soft tissues are unremarkable. IMPRESSION: Negative. Electronically Signed   By: Charlett NoseKevin  Dover M.D.   On: 05/04/2019 21:20   Dg Knee 2 Views Right  Result Date: 05/04/2019 CLINICAL DATA:  Fall EXAM: RIGHT KNEE - 1-2 VIEW COMPARISON:  None. FINDINGS: No evidence of fracture, dislocation, or joint effusion. No evidence of arthropathy or other focal bone abnormality. Soft tissues are unremarkable. IMPRESSION: Negative. Electronically Signed   By: Charlett NoseKevin  Dover M.D.   On: 05/04/2019 21:20    Procedures Procedures (including critical care time)  Medications Ordered in ED Medications - No data to display   Initial Impression / Assessment and Plan / ED Course  I have reviewed the triage vital signs and the nursing notes.  Pertinent labs & imaging results that were available during my care of the patient were reviewed by me and considered in my medical decision making (see chart for details).        MDM  Screen complete  Heather PandaGail M Noren was evaluated in Emergency Department on 05/04/2019 for the symptoms described in the history of present illness. She was evaluated in  the context of the global COVID-19 pandemic, which necessitated consideration that the patient might be at risk for infection with the SARS-CoV-2 virus that causes COVID-19. Institutional protocols and algorithms that pertain to  the evaluation of patients at risk for COVID-19 are in a state of rapid change based on information released by regulatory bodies including the CDC and federal and state organizations. These policies and algorithms were followed during the patient's care in the ED.   Patient presented following reported mechanical fall.  Patient without evidence of significant traumatic injury on exam.  Screening imaging does not reveal fracture or other acute injury.  Patient is appropriate for discharge.  Patient's daughter understands need for close follow-up.  Strict return precautions given and understood.   Final Clinical Impressions(s) / ED Diagnoses   Final diagnoses:  Fall, initial encounter    ED Discharge Orders    None       Wynetta Fines, MD 05/04/19 [id number]

## 2019-05-04 NOTE — Discharge Instructions (Addendum)
Please return for any problem.  Follow-up with your regular care provider as instructed. °

## 2019-06-14 ENCOUNTER — Other Ambulatory Visit: Payer: Self-pay

## 2019-06-14 ENCOUNTER — Emergency Department (HOSPITAL_COMMUNITY): Payer: Medicare Other

## 2019-06-14 ENCOUNTER — Emergency Department (HOSPITAL_COMMUNITY)
Admission: EM | Admit: 2019-06-14 | Discharge: 2019-06-14 | Disposition: A | Discharge status: Home / Self Care | Payer: Medicare Other | Attending: Emergency Medicine | Admitting: Emergency Medicine

## 2019-06-14 ENCOUNTER — Encounter (HOSPITAL_COMMUNITY): Payer: Self-pay

## 2019-06-14 DIAGNOSIS — F0390 Unspecified dementia without behavioral disturbance: Secondary | ICD-10-CM | POA: Diagnosis not present

## 2019-06-14 DIAGNOSIS — S0101XA Laceration without foreign body of scalp, initial encounter: Secondary | ICD-10-CM | POA: Insufficient documentation

## 2019-06-14 DIAGNOSIS — Y9289 Other specified places as the place of occurrence of the external cause: Secondary | ICD-10-CM | POA: Diagnosis not present

## 2019-06-14 DIAGNOSIS — Z87891 Personal history of nicotine dependence: Secondary | ICD-10-CM | POA: Diagnosis not present

## 2019-06-14 DIAGNOSIS — I10 Essential (primary) hypertension: Secondary | ICD-10-CM | POA: Insufficient documentation

## 2019-06-14 DIAGNOSIS — S0990XA Unspecified injury of head, initial encounter: Secondary | ICD-10-CM

## 2019-06-14 DIAGNOSIS — W2201XA Walked into wall, initial encounter: Secondary | ICD-10-CM | POA: Diagnosis not present

## 2019-06-14 DIAGNOSIS — Y999 Unspecified external cause status: Secondary | ICD-10-CM | POA: Insufficient documentation

## 2019-06-14 DIAGNOSIS — Z79899 Other long term (current) drug therapy: Secondary | ICD-10-CM | POA: Diagnosis not present

## 2019-06-14 DIAGNOSIS — Y9301 Activity, walking, marching and hiking: Secondary | ICD-10-CM | POA: Diagnosis not present

## 2019-06-14 NOTE — ED Triage Notes (Signed)
Pt BIB EMS from [facility]. Pt hit head on a box that is mounted to the wall on the Memory Unit. Pt did not fall, facility denies patient LOC. Pt refused to wear c-collar for EMS. Pt has hx of dementia. Pt has dried blood to top of head upon arrival to room in ED.   95% RA

## 2019-06-14 NOTE — Discharge Instructions (Addendum)
Patient staples need to be removed in 1 week.  There are 2 staples present that need to be removed.  Keep the wound dry for the next 24 hours.  After that you can shampoo her scalp as indicated but do not do any excessive hair maintenance or combing.  Return to the emergency room if she has any change in mental status, vomiting, fevers or drainage from her wound.

## 2019-06-14 NOTE — ED Provider Notes (Signed)
[facility]-EMERGENCY DEPT Provider Note   CSN: [id number] Arrival date & time: 06/14/19  2119     History Chief Complaint  Patient presents with  . Head Laceration    QUINCY PRISCO is a 85 y.o. female.  Patient is a 85 year old female who presents with a head injury.  She has a history of dementia so history is limited.  Reportedly from EMS, she was walking in the hall and hit her head on a mounted box on the wall.  There was no reported loss of consciousness.  She does have a laceration to the top of her head.  She has not had any other reports of injuries or complaints.        Past Medical History:  Diagnosis Date  . Abnormality of gait 09/08/2014  . Carotid artery occlusion   . Concussion with loss of consciousness 09/08/2014  . DJD (degenerative joint disease) of cervical spine   . Hyperlipidemia   . Hypertension   . Memory disturbance 03/29/2013  . Osteoporosis   . Personal history of colonic polyps   . RLS (restless legs syndrome)   . Sleep disorder   . TIA (transient ischemic attack)   . Varicose veins     Patient Active Problem List   Diagnosis Date Noted  . Pain in left elbow 08/10/2018  . Rupture of left triceps tendon 07/26/2018  . Closed olecranon fracture, left, initial encounter 07/24/2018  . Pubic ramus fracture (HCC) 07/24/2018  . Closed fracture of left inferior pubic ramus (HCC)   . Fall 07/23/2018  . Acute lower UTI 07/23/2018  . Palliative care encounter   . Colitis 08/25/2017  . Abdominal pain 04/08/2017  . Leukocytosis 04/08/2017  . Dementia (HCC) 04/08/2017  . Dehydration 04/08/2017  . Abnormal urinalysis 04/08/2017  . Enteritis 04/07/2017  . Abnormality of gait 09/08/2014  . Concussion with loss of consciousness 09/08/2014  . Subdural hematoma (HCC) 08/11/2014  . Hypokalemia 08/11/2014  . SDH (subdural hematoma) (HCC) 08/11/2014  . Hyperlipidemia   . Hypertension   . RLS (restless legs syndrome)   . DJD  (degenerative joint disease) of cervical spine   . TIA (transient ischemic attack)   . Carotid artery occlusion   . Essential hypertension   . Memory disturbance 03/29/2013  . Near syncope 01/13/2013  . Accelerated hypertension 01/13/2013  . Hyperkalemia 01/13/2013    Past Surgical History:  Procedure Laterality Date  . ANTERIOR AND POSTERIOR VAGINAL REPAIR W/ SACROSPINOUS LIGAMENT SUSPENSION    . CESAREAN SECTION    . ENDOVENOUS ABLATION SAPHENOUS VEIN W/ LASER    . HEMORRHOID SURGERY    . JOINT REPLACEMENT    . ORIF ELBOW FRACTURE Left 07/26/2018   Procedure: OPEN REDUCTION INTERNAL FIXATION (ORIF) ELBOW/OLECRANON FRACTURE;  Surgeon: Tarry Kos, MD;  Location: [facility] OR;  Service: Orthopedics;  Laterality: Left;  . REDUCTION MAMMAPLASTY    . TOTAL HIP ARTHROPLASTY Left 10/19/2017   Procedure: ANTERIOR HEMI HIP;  Surgeon: Tarry Kos, MD;  Location: [facility] OR;  Service: Orthopedics;  Laterality: Left;     OB History   No obstetric history on file.     Family History  Problem Relation Age of Onset  . Mental illness Mother   . Alzheimer's disease Mother   . Cancer Father   . Bone cancer Father   . Dementia Sister     Social History   Tobacco Use  . Smoking status: Former Smoker    Quit date: 06/16/1980  Years since quitting: 39.0  . Smokeless tobacco: Never Used  Substance Use Topics  . Alcohol use: No  . Drug use: No    Home Medications Prior to Admission medications   Medication Sig Start Date End Date Taking? Authorizing Provider  acetaminophen (TYLENOL) 325 MG tablet Take 650 mg by mouth every 6 (six) hours as needed for mild pain or headache.   Yes [provider]  amLODipine (NORVASC) 5 MG tablet Take 5 mg by mouth at bedtime.  02/01/18  Yes [provider]  diclofenac Sodium (VOLTAREN) 1 % GEL Apply 2 g topically 3 (three) times daily as needed (right thumb pain).   Yes [provider]  feeding supplement, ENSURE ENLIVE, (ENSURE  ENLIVE) LIQD Take 237 mLs by mouth 3 (three) times daily between meals. Patient taking differently: Take 237 mLs by mouth 3 (three) times daily as needed (feeding supplement).  07/29/18  Yes Elgergawy, Leana Roe, MD  gabapentin (NEURONTIN) 100 MG capsule Take 100 mg by mouth 3 (three) times daily. 03/30/18  Yes [provider]  hydrOXYzine (ATARAX/VISTARIL) 25 MG tablet Take 1 tablet (25 mg total) by mouth 3 (three) times daily as needed. Patient taking differently: Take 25 mg by mouth 2 (two) times daily.  07/02/18  Yes Butch Penny, NP  QUEtiapine (SEROQUEL) 25 MG tablet Take 25 mg by mouth 2 (two) times daily. 12/25/18  Yes [provider]  senna-docusate (SENOKOT-S) 8.6-50 MG tablet Take 3 tablets by mouth 2 (two) times daily. Patient taking differently: Take 1 tablet by mouth at bedtime as needed for mild constipation.  07/29/18  Yes Elgergawy, Leana Roe, MD  sertraline (ZOLOFT) 100 MG tablet Take 1 tablet (100 mg total) by mouth daily. 09/21/17  Yes Butch Penny, NP  traMADol (ULTRAM) 50 MG tablet Take 1 tablet (50 mg total) by mouth at bedtime. 01/22/19  Yes Charlynne Pander, MD  traZODone (DESYREL) 50 MG tablet Take 1.5 tablets (75 mg total) by mouth at bedtime. 07/02/18  Yes Butch Penny, NP  bisacodyl (DULCOLAX) 5 MG EC tablet Take 1 tablet (5 mg total) by mouth daily as needed for moderate constipation. Patient not taking: Reported on 01/22/2019 07/29/18   Elgergawy, Leana Roe, MD  oxyCODONE (OXY IR/ROXICODONE) 5 MG immediate release tablet Take 1 tablet (5 mg total) by mouth every 6 (six) hours as needed for severe pain. Patient not taking: Reported on 11/24/2018 07/29/18   Elgergawy, Leana Roe, MD  polyethylene glycol [facility]) packet Take 17 g by mouth daily as needed for moderate constipation. Patient not taking: Reported on 01/22/2019 07/29/18   Elgergawy, Leana Roe, MD    Allergies    Aspirin and Codeine  Review of Systems   Review of Systems  Unable to perform ROS:  Dementia    Physical Exam Updated Vital Signs BP (!) 149/81   Pulse 80   Temp 97.9 F (36.6 C) (Oral)   Resp 18   SpO2 99%   Physical Exam Constitutional:      Appearance: She is well-developed.  HENT:     Head: Normocephalic.     Comments: 1.5 cm laceration to the parietal scalp area, no active bleeding Eyes:     Pupils: Pupils are equal, round, and reactive to light.  Neck:     Comments: No pain to the cervical, thoracic or lumbosacral spine Cardiovascular:     Rate and Rhythm: Normal rate and regular rhythm.     Heart sounds: Normal heart sounds.  Pulmonary:  Effort: Pulmonary effort is normal. No respiratory distress.     Breath sounds: Normal breath sounds. No wheezing or rales.  Chest:     Chest wall: No tenderness.  Abdominal:     General: Bowel sounds are normal.     Palpations: Abdomen is soft.     Tenderness: There is no abdominal tenderness. There is no guarding or rebound.  Musculoskeletal:        General: Normal range of motion.     Comments: No pain on palpation or range of motion of the extremities  Lymphadenopathy:     Cervical: No cervical adenopathy.  Skin:    General: Skin is warm and dry.     Findings: No rash.  Neurological:     Mental Status: She is alert and oriented to person, place, and time.     ED Results / Procedures / Treatments   Labs (all labs ordered are listed, but only abnormal results are displayed) Labs Reviewed - No data to display  EKG None  Radiology CT Head Wo Contrast  Result Date: 06/14/2019 CLINICAL DATA:  Head trauma, headache; head trauma, high risk on nexus Dementia patient struck head on the box mounted to the wall. EXAM: CT HEAD WITHOUT CONTRAST CT CERVICAL SPINE WITHOUT CONTRAST TECHNIQUE: Multidetector CT imaging of the head and cervical spine was performed following the standard protocol without intravenous contrast. Multiplanar CT image reconstructions of the cervical spine were also generated.  COMPARISON:  Head and cervical spine CT 01/22/2019 FINDINGS: CT HEAD FINDINGS Brain: No intracranial hemorrhage, mass effect, or midline shift. Similar degree of atrophy and chronic small vessel ischemia to prior exam. No hydrocephalus. The basilar cisterns are patent. No evidence of territorial infarct or acute ischemia. No extra-axial or intracranial fluid collection. Vascular: Atherosclerosis of skullbase vasculature without hyperdense vessel or abnormal calcification. Skull: No fracture or focal lesion. Sinuses/Orbits: No acute findings. Bilateral cataract resection. Other: Vertex scalp hematoma, not entirely included in the field of view due to motion. CT CERVICAL SPINE FINDINGS Alignment: Mild broad-based rightward curvature of the cervical spine. Patient's head is rotated, slight offset of C1 on C2 is likely positional, no fractures visualized. Trace anterolisthesis of C4 on C5, likely facet mediated. No traumatic subluxation. Skull base and vertebrae: No acute fracture. No evidence of focal bone lesion or bony destruction. Soft tissues and spinal canal: No prevertebral fluid or swelling. No visible canal hematoma. Disc levels: Disc space narrowing and endplate spurring most prominent at C5-C6 and C6-C7. Multilevel facet hypertrophy. Upper chest: No acute findings. Other: Mild motion artifact. IMPRESSION: 1. Vertex scalp hematoma. No acute intracranial abnormality. No skull fracture. 2. Unchanged atrophy and chronic small vessel ischemia. 3. Multilevel degenerative change in the cervical spine without acute fracture or subluxation. Electronically Signed   By: Narda Rutherford M.D.   On: 06/14/2019 23:05   CT Cervical Spine Wo Contrast  Result Date: 06/14/2019 CLINICAL DATA:  Head trauma, headache; head trauma, high risk on nexus Dementia patient struck head on the box mounted to the wall. EXAM: CT HEAD WITHOUT CONTRAST CT CERVICAL SPINE WITHOUT CONTRAST TECHNIQUE: Multidetector CT imaging of the head and  cervical spine was performed following the standard protocol without intravenous contrast. Multiplanar CT image reconstructions of the cervical spine were also generated. COMPARISON:  Head and cervical spine CT 01/22/2019 FINDINGS: CT HEAD FINDINGS Brain: No intracranial hemorrhage, mass effect, or midline shift. Similar degree of atrophy and chronic small vessel ischemia to prior exam. No hydrocephalus. The basilar cisterns  are patent. No evidence of territorial infarct or acute ischemia. No extra-axial or intracranial fluid collection. Vascular: Atherosclerosis of skullbase vasculature without hyperdense vessel or abnormal calcification. Skull: No fracture or focal lesion. Sinuses/Orbits: No acute findings. Bilateral cataract resection. Other: Vertex scalp hematoma, not entirely included in the field of view due to motion. CT CERVICAL SPINE FINDINGS Alignment: Mild broad-based rightward curvature of the cervical spine. Patient's head is rotated, slight offset of C1 on C2 is likely positional, no fractures visualized. Trace anterolisthesis of C4 on C5, likely facet mediated. No traumatic subluxation. Skull base and vertebrae: No acute fracture. No evidence of focal bone lesion or bony destruction. Soft tissues and spinal canal: No prevertebral fluid or swelling. No visible canal hematoma. Disc levels: Disc space narrowing and endplate spurring most prominent at C5-C6 and C6-C7. Multilevel facet hypertrophy. Upper chest: No acute findings. Other: Mild motion artifact. IMPRESSION: 1. Vertex scalp hematoma. No acute intracranial abnormality. No skull fracture. 2. Unchanged atrophy and chronic small vessel ischemia. 3. Multilevel degenerative change in the cervical spine without acute fracture or subluxation. Electronically Signed   By: Keith Rake M.D.   On: 06/14/2019 23:05    Procedures .Marland KitchenLaceration Repair  Date/Time: 06/14/2019 10:59 PM Performed by: Malvin Johns, MD Authorized by: Malvin Johns, MD    Consent:    Consent obtained:  Verbal   Consent given by:  Patient and guardian   Risks discussed:  Poor wound healing, infection and pain   Alternatives discussed:  No treatment Anesthesia (see MAR for exact dosages):    Anesthesia method:  None Laceration details:    Location:  Scalp   Scalp location:  Crown   Length (cm):  1.5 Repair type:    Repair type:  Simple Pre-procedure details:    Preparation:  Patient was prepped and draped in usual sterile fashion and imaging obtained to evaluate for foreign bodies Exploration:    Wound exploration: entire depth of wound probed and visualized     Wound extent: no fascia violation noted, no foreign bodies/material noted, no muscle damage noted, no underlying fracture noted and no vascular damage noted     Contaminated: no   Treatment:    Area cleansed with:  Saline   Amount of cleaning:  Standard   Irrigation solution:  Sterile saline   Irrigation method:  Syringe   Visualized foreign bodies/material removed: no   Skin repair:    Repair method:  Staples   Number of staples:  2 Approximation:    Approximation:  Close Post-procedure details:    Dressing:  Open (no dressing)   (including critical care time)  Medications Ordered in ED Medications - No data to display  ED Course  I have reviewed the triage vital signs and the nursing notes.  Pertinent labs & imaging results that were available during my care of the patient were reviewed by me and considered in my medical decision making (see chart for details).    MDM Rules/Calculators/A&P                      Patient presents after a minor head injury.  The wound was repaired in the ED with staples.  Her CT scans of her head and cervical spine were negative for acute abnormality.  She was discharged home in good condition.  Wound care instructions were given on the discharge papers as well as to her daughter.  Her daughter states her tetanus shot is up-to-date.  Return  precautions  were given. Final Clinical Impression(s) / ED Diagnoses Final diagnoses:  Laceration of scalp, initial encounter  Injury of head, initial encounter    Rx / DC Orders ED Discharge Orders    None       Rolan BuccoBelfi, Bethaney Oshana, MD 06/14/19 2321

## 2019-07-05 ENCOUNTER — Ambulatory Visit: Payer: Self-pay | Admitting: *Deleted

## 2019-07-05 ENCOUNTER — Telehealth: Payer: Self-pay | Admitting: *Deleted

## 2019-07-05 NOTE — Telephone Encounter (Signed)
Pt allergic to ASA and NSAIDS and is wondering if she would be able to take the vaccine. Informed pt that she would be screened for allergies and kept extra time to make sure she was safe before leaving. Explained all medical equipment and nurses would be there. Verbalized understanding.

## 2019-08-11 ENCOUNTER — Emergency Department (HOSPITAL_COMMUNITY): Payer: Medicare PPO

## 2019-08-11 ENCOUNTER — Other Ambulatory Visit: Payer: Self-pay

## 2019-08-11 ENCOUNTER — Emergency Department (HOSPITAL_COMMUNITY)
Admission: EM | Admit: 2019-08-11 | Discharge: 2019-08-11 | Disposition: A | Discharge status: Skilled Nursing Facility | Payer: Medicare PPO | Attending: Emergency Medicine | Admitting: Emergency Medicine

## 2019-08-11 DIAGNOSIS — R9389 Abnormal findings on diagnostic imaging of other specified body structures: Secondary | ICD-10-CM

## 2019-08-11 DIAGNOSIS — Z8673 Personal history of transient ischemic attack (TIA), and cerebral infarction without residual deficits: Secondary | ICD-10-CM | POA: Diagnosis not present

## 2019-08-11 DIAGNOSIS — Z79899 Other long term (current) drug therapy: Secondary | ICD-10-CM | POA: Diagnosis not present

## 2019-08-11 DIAGNOSIS — Z87891 Personal history of nicotine dependence: Secondary | ICD-10-CM | POA: Insufficient documentation

## 2019-08-11 DIAGNOSIS — R05 Cough: Secondary | ICD-10-CM | POA: Diagnosis not present

## 2019-08-11 DIAGNOSIS — F0390 Unspecified dementia without behavioral disturbance: Secondary | ICD-10-CM | POA: Diagnosis not present

## 2019-08-11 DIAGNOSIS — R059 Cough, unspecified: Secondary | ICD-10-CM

## 2019-08-11 DIAGNOSIS — I10 Essential (primary) hypertension: Secondary | ICD-10-CM | POA: Diagnosis not present

## 2019-08-11 DIAGNOSIS — Z96642 Presence of left artificial hip joint: Secondary | ICD-10-CM | POA: Diagnosis not present

## 2019-08-11 DIAGNOSIS — R0989 Other specified symptoms and signs involving the circulatory and respiratory systems: Secondary | ICD-10-CM | POA: Diagnosis present

## 2019-08-11 MED ORDER — CLINDAMYCIN HCL 300 MG PO CAPS
300.0000 mg | ORAL_CAPSULE | Freq: Once | ORAL | Status: AC
  Administered 2019-08-11: 300 mg via ORAL
  Filled 2019-08-11: qty 1

## 2019-08-11 MED ORDER — CLINDAMYCIN HCL 150 MG PO CAPS: 150.0000 mg | ORAL_CAPSULE | Freq: Four times a day (QID) | ORAL | 0 refills | Status: DC

## 2019-08-11 NOTE — ED Triage Notes (Addendum)
Per [facility], patient had choking episode this morning, involving eggs. Facility wanted patient sent out to be evaluated for possible aspiration. Patient uncooperative during triage, trying to get out of bed. Screaming loudly, has history of dementia

## 2019-08-11 NOTE — ED Notes (Signed)
Patient's daughter at bedside.

## 2019-08-11 NOTE — ED Notes (Signed)
Final vitals refused.

## 2019-08-11 NOTE — ED Notes (Signed)
Nurse Hilda Lias from [facility] called for update, advised o2 sat stable, cxr not resulted yet, patient will be d/c if cxr clear, daughter will transport back to facility. nurse will call facility at [phone] upon patient's d/c.
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
## 2019-08-11 NOTE — Discharge Instructions (Addendum)
Oxygen level and respirations normal here. Chest x-Heather Floyd with some streaky changes but could be shallow breath or scarring. However, if patient develops symptoms, she should be reevaluated.

## 2020-09-04 ENCOUNTER — Emergency Department (HOSPITAL_COMMUNITY)
Admission: EM | Admit: 2020-09-04 | Discharge: 2020-09-04 | Disposition: A | Discharge status: Home / Self Care | Payer: Medicare PPO | Attending: Emergency Medicine | Admitting: Emergency Medicine

## 2020-09-04 ENCOUNTER — Other Ambulatory Visit: Payer: Self-pay

## 2020-09-04 ENCOUNTER — Encounter (HOSPITAL_COMMUNITY): Payer: Self-pay

## 2020-09-04 ENCOUNTER — Emergency Department (HOSPITAL_COMMUNITY): Payer: Medicare PPO

## 2020-09-04 DIAGNOSIS — I10 Essential (primary) hypertension: Secondary | ICD-10-CM | POA: Diagnosis not present

## 2020-09-04 DIAGNOSIS — Z87891 Personal history of nicotine dependence: Secondary | ICD-10-CM | POA: Insufficient documentation

## 2020-09-04 DIAGNOSIS — S42401A Unspecified fracture of lower end of right humerus, initial encounter for closed fracture: Secondary | ICD-10-CM | POA: Insufficient documentation

## 2020-09-04 DIAGNOSIS — Z96642 Presence of left artificial hip joint: Secondary | ICD-10-CM | POA: Insufficient documentation

## 2020-09-04 DIAGNOSIS — W19XXXA Unspecified fall, initial encounter: Secondary | ICD-10-CM | POA: Insufficient documentation

## 2020-09-04 DIAGNOSIS — F0390 Unspecified dementia without behavioral disturbance: Secondary | ICD-10-CM | POA: Diagnosis not present

## 2020-09-04 DIAGNOSIS — S59901A Unspecified injury of right elbow, initial encounter: Secondary | ICD-10-CM | POA: Diagnosis present

## 2020-09-04 DIAGNOSIS — Z464 Encounter for fitting and adjustment of orthodontic device: Secondary | ICD-10-CM | POA: Insufficient documentation

## 2020-09-04 DIAGNOSIS — Z5321 Procedure and treatment not carried out due to patient leaving prior to being seen by health care provider: Secondary | ICD-10-CM | POA: Insufficient documentation

## 2020-09-04 HISTORY — DX: Unspecified dementia, unspecified severity, without behavioral disturbance, psychotic disturbance, mood disturbance, and anxiety: F03.90

## 2020-09-04 MED ORDER — ACETAMINOPHEN 160 MG/5ML PO SOLN
480.0000 mg | Freq: Once | ORAL | Status: AC
  Administered 2020-09-04: 480 mg via ORAL
  Filled 2020-09-04: qty 15

## 2020-09-04 MED ORDER — ACETAMINOPHEN 325 MG PO TABS: 650.0000 mg | ORAL_TABLET | Freq: Four times a day (QID) | ORAL | 0 refills | Status: DC | PRN

## 2020-09-04 NOTE — Discharge Instructions (Addendum)
Please be sure to follow-up with our surgery colleagues next week.  Return here for concerning changes in your condition.

## 2020-09-04 NOTE — ED Provider Notes (Signed)
[facility]-EMERGENCY DEPT Provider Note   CSN: [id number] Arrival date & time: 09/04/20  1038     History Chief Complaint  Patient presents with  . Elbow Pain    Heather Floyd is a 87 y.o. female.  HPI Patient presents via EMS.  Those individuals provide the history as the patient has advanced dementia, cannot provide any details of her history, level 5 caveat.    Seemingly the patient was in her usual state of health until about 8 hours ago.  She had a mechanical fall that was witnessed at her nursing facility.  Since that time she has had apparent pain in the right elbow, with unwillingness to move it or let individual examined.  There is reportedly swelling in that area.  Patient has been ambulatory, walking about in the facility. No report of additional falls, change in interactivity, EMS reports no hemodynamic instability or changes in behavior in route.   Past Medical History:  Diagnosis Date  . Abnormality of gait 09/08/2014  . Carotid artery occlusion   . Concussion with loss of consciousness 09/08/2014  . Dementia (HCC)   . DJD (degenerative joint disease) of cervical spine   . Hyperlipidemia   . Hypertension   . Memory disturbance 03/29/2013  . Osteoporosis   . Personal history of colonic polyps   . RLS (restless legs syndrome)   . Sleep disorder   . TIA (transient ischemic attack)   . Varicose veins     Patient Active Problem List   Diagnosis Date Noted  . Pain in left elbow 08/10/2018  . Rupture of left triceps tendon 07/26/2018  . Closed olecranon fracture, left, initial encounter 07/24/2018  . Pubic ramus fracture (HCC) 07/24/2018  . Closed fracture of left inferior pubic ramus (HCC)   . Fall 07/23/2018  . Acute lower UTI 07/23/2018  . Palliative care encounter   . Colitis 08/25/2017  . Abdominal pain 04/08/2017  . Leukocytosis 04/08/2017  . Dementia (HCC) 04/08/2017  . Dehydration 04/08/2017  . Abnormal urinalysis 04/08/2017   . Enteritis 04/07/2017  . Abnormality of gait 09/08/2014  . Concussion with loss of consciousness 09/08/2014  . Subdural hematoma (HCC) 08/11/2014  . Hypokalemia 08/11/2014  . SDH (subdural hematoma) (HCC) 08/11/2014  . Hyperlipidemia   . Hypertension   . RLS (restless legs syndrome)   . DJD (degenerative joint disease) of cervical spine   . TIA (transient ischemic attack)   . Carotid artery occlusion   . Essential hypertension   . Memory disturbance 03/29/2013  . Near syncope 01/13/2013  . Accelerated hypertension 01/13/2013  . Hyperkalemia 01/13/2013    Past Surgical History:  Procedure Laterality Date  . ANTERIOR AND POSTERIOR VAGINAL REPAIR W/ SACROSPINOUS LIGAMENT SUSPENSION    . CESAREAN SECTION    . ENDOVENOUS ABLATION SAPHENOUS VEIN W/ LASER    . HEMORRHOID SURGERY    . JOINT REPLACEMENT    . ORIF ELBOW FRACTURE Left 07/26/2018   Procedure: OPEN REDUCTION INTERNAL FIXATION (ORIF) ELBOW/OLECRANON FRACTURE;  Surgeon: Tarry Kos, MD;  Location: [facility] OR;  Service: Orthopedics;  Laterality: Left;  . REDUCTION MAMMAPLASTY    . TOTAL HIP ARTHROPLASTY Left 10/19/2017   Procedure: ANTERIOR HEMI HIP;  Surgeon: Tarry Kos, MD;  Location: [facility] OR;  Service: Orthopedics;  Laterality: Left;     OB History   No obstetric history on file.     Family History  Problem Relation Age of Onset  . Mental illness Mother   .  Alzheimer's disease Mother   . Cancer Father   . Bone cancer Father   . Dementia Sister     Social History   Tobacco Use  . Smoking status: Former Smoker    Quit date: 06/16/1980    Years since quitting: 40.2  . Smokeless tobacco: Never Used  Vaping Use  . Vaping Use: Never used  Substance Use Topics  . Alcohol use: No  . Drug use: No    Home Medications Prior to Admission medications   Medication Sig Start Date End Date Taking? Authorizing Provider  acetaminophen (TYLENOL) 325 MG tablet Take 2 tablets (650 mg total) by mouth every 6 (six) hours as  needed for mild pain or headache. Tablet should be crushed and provided in food 09/04/20   Gerhard MunchLockwood, Zala Degrasse, MD  amLODipine (NORVASC) 5 MG tablet Take 5 mg by mouth at bedtime.  02/01/18   [provider]  bisacodyl (DULCOLAX) 5 MG EC tablet Take 1 tablet (5 mg total) by mouth daily as needed for moderate constipation. 07/29/18   Elgergawy, Leana Roeawood S, MD  clindamycin (CLEOCIN) 150 MG capsule Take 1 capsule (150 mg total) by mouth every 6 (six) hours. 08/11/19   Margarita Grizzleay, Danielle, MD  diclofenac Sodium (VOLTAREN) 1 % GEL Apply 2 g topically 3 (three) times daily as needed (right thumb pain).    [provider]  feeding supplement, ENSURE ENLIVE, (ENSURE ENLIVE) LIQD Take 237 mLs by mouth 3 (three) times daily between meals. Patient taking differently: Take 237 mLs by mouth 3 (three) times daily as needed (feeding supplement).  07/29/18   Elgergawy, Leana Roeawood S, MD  gabapentin (NEURONTIN) 100 MG capsule Take 100 mg by mouth 3 (three) times daily. 03/30/18   [provider]  hydrOXYzine (ATARAX/VISTARIL) 25 MG tablet Take 1 tablet (25 mg total) by mouth 3 (three) times daily as needed. Patient taking differently: Take 25 mg by mouth 2 (two) times daily. Additional 25 mg as needed 07/02/18   Butch PennyMillikan, Megan, NP  oxyCODONE (OXY IR/ROXICODONE) 5 MG immediate release tablet Take 1 tablet (5 mg total) by mouth every 6 (six) hours as needed for severe pain. Patient not taking: Reported on 11/24/2018 07/29/18   Elgergawy, Leana Roeawood S, MD  polyethylene glycol [facility](MIRALAX) packet Take 17 g by mouth daily as needed for moderate constipation. 07/29/18   Elgergawy, Leana Roeawood S, MD  QUEtiapine (SEROQUEL) 25 MG tablet Take 25 mg by mouth 2 (two) times daily. 12/25/18   [provider]  senna-docusate (SENOKOT-S) 8.6-50 MG tablet Take 3 tablets by mouth 2 (two) times daily. Patient taking differently: Take 1 tablet by mouth at bedtime as needed for mild constipation.  07/29/18   Elgergawy, Leana Roeawood S, MD   sertraline (ZOLOFT) 100 MG tablet Take 1 tablet (100 mg total) by mouth daily. 09/21/17   Butch PennyMillikan, Megan, NP  traMADol (ULTRAM) 50 MG tablet Take 1 tablet (50 mg total) by mouth at bedtime. Patient taking differently: Take 25 mg by mouth at bedtime.  01/22/19   Charlynne PanderYao, David Hsienta, MD  traZODone (DESYREL) 50 MG tablet Take 1.5 tablets (75 mg total) by mouth at bedtime. 07/02/18   Butch PennyMillikan, Megan, NP  Vitamin D, Ergocalciferol, (DRISDOL) 1.25 MG (50000 UNIT) CAPS capsule Take 50,000 Units by mouth every 7 (seven) days.    [provider]    Allergies    Aspirin and Codeine  Review of Systems   Review of Systems  Unable to perform ROS: Dementia    Physical Exam Updated Vital Signs  BP (!) 130/57 (BP Location: Left Arm)   Pulse 65   Temp 97.9 F (36.6 C) (Oral)   Resp 18   Ht 5\' 2"  (1.575 m)   Wt 50 kg   SpO2 95%   BMI 20.16 kg/m   Physical Exam Vitals and nursing note reviewed.  Constitutional:      General: She is not in acute distress.    Appearance: She is well-developed.  HENT:     Head: Normocephalic and atraumatic.  Eyes:     Conjunctiva/sclera: Conjunctivae normal.  Cardiovascular:     Rate and Rhythm: Normal rate and regular rhythm.  Pulmonary:     Effort: Pulmonary effort is normal. No respiratory distress.     Breath sounds: Normal breath sounds. No stridor.  Abdominal:     General: There is no distension.  Musculoskeletal:     Right shoulder: Normal.     Left shoulder: Normal.     Right elbow: Swelling and deformity present. Decreased range of motion. Tenderness present.     Left elbow: Normal.     Right wrist: Normal.     Left wrist: Normal.     Comments: Lower extremities unremarkable, patient moves both legs spontaneously, and has no guarding to palpation, no tenderness in either hip, knee.  Skin:    General: Skin is warm and dry.  Neurological:     Mental Status: She is alert.     Cranial Nerves: No cranial nerve deficit.     Motor: Atrophy  present.     Comments: Moves all extremities spontaneously, though with minimal motion of the right upper extremity.Does not follow commands reliably, speaks freely, tries to answer questions, but with nonsensical answers.  Psychiatric:        Cognition and Memory: Cognition is impaired. Memory is impaired.     ED Results / Procedures / Treatments   Labs (all labs ordered are listed, but only abnormal results are displayed) Labs Reviewed - No data to display  EKG None  Radiology DG Elbow 2 Views Right  Result Date: 09/04/2020 CLINICAL DATA:  87 year old female status post fall at 0200 hours. Pain and swelling. EXAM: RIGHT ELBOW - 2 VIEW COMPARISON:  Right elbow series 07/03/2009. FINDINGS: Osteopenia since 2011. Positive joint effusion with comminuted but minimally displaced fracture through the olecranon of the proximal ulna. The distal humerus and proximal radius appear to remain intact. Regional superficial soft tissue swelling. IMPRESSION: Comminuted but minimally displaced fracture through the olecranon of the proximal ulna with right elbow hemarthrosis. Electronically Signed   By: 2012 M.D.   On: 09/04/2020 11:44   CT ELBOW RIGHT WO CONTRAST  Result Date: 09/04/2020 CLINICAL DATA:  Fall with right elbow pain. Assess proximal ulnar fracture EXAM: CT OF THE LOWER RIGHT EXTREMITY WITHOUT CONTRAST TECHNIQUE: Multidetector CT imaging of the right lower extremity was performed according to the standard protocol. COMPARISON:  X-ray 09/04/2020 FINDINGS: Bones/Joint/Cartilage Acute comminuted fracture of the olecranon process of the proximal ulna with intra-articular extension to the central trochlear notch. Minimal displacement with approximately 2 mm of articular-surface diastasis. Osseous alignment is maintained without dislocation. No additional fractures. Radial head and neck intact. Small rounded subchondral lucencies of the radial head likely reflective of degenerative subchondral cysts.  Large elbow joint hemarthrosis. Ligaments Suboptimally assessed by CT. Muscles and Tendons No acute musculotendinous abnormality evident by CT. Soft tissues Ill-defined superficial hematoma overlying the posterior elbow measuring approximately 6 x 1 x 4 cm. No soft tissue air to  suggest open fracture. IMPRESSION: 1. Acute comminuted minimally displaced fracture of the olecranon process of the proximal ulna with intra-articular extension to the central trochlear notch. 2. Large elbow joint hemarthrosis. 3. Ill-defined hematoma overlying the posterior elbow measuring approximately 6 x 1 x 4 cm. Electronically Signed   By: Duanne Guess D.O.   On: 09/04/2020 13:08    Procedures Procedures   Medications Ordered in ED Medications  acetaminophen (TYLENOL) 160 MG/5ML solution 480 mg (480 mg Oral Given 09/04/20 1429)    ED Course  I have reviewed the triage vital signs and the nursing notes.  Pertinent labs & imaging results that were available during my care of the patient were reviewed by me and considered in my medical decision making (see chart for details).  On repeat exam the patient is now committed by her daughter. With daughter present discussed today's evaluation, notable for demonstration of right elbow fracture, specifically olecranon fracture. Patient remains otherwise in no distress, is awake, alert, speaking animatedly, no distress. I discussed her case with our orthopedic colleagues, the patient has had CT scan performed as well.   Update:, Patient has tolerated splint application, with additional taping to prevent accidental removal of it by her. After discussing her case with our orthopedic colleagues, patient discharged in stable condition to follow-up as an outpatient for ongoing management of her elbow fracture. Final Clinical Impression(s) / ED Diagnoses Final diagnoses:  Closed fracture of right elbow, initial encounter    Rx / DC Orders ED Discharge Orders          Ordered    acetaminophen (TYLENOL) 325 MG tablet  Every 6 hours PRN        09/04/20 1407           Gerhard Munch, MD 09/05/20 1442

## 2020-09-04 NOTE — ED Notes (Signed)
Assisted pt to restroom, pt has steady gait w assistance 

## 2020-09-04 NOTE — ED Triage Notes (Signed)
Pt w advanced dementia coming from facility after fall at 0200am last night. Pt c/o rt elbow pain & swelling, no visible deformity, skin tear, bruising noted. Pt alert to self, is DNR

## 2020-09-04 NOTE — ED Notes (Signed)
Daughter taking pt back to facility, went over discharge papers and follow-up w daughter, daughter had DNR form and paperwork from facility with her

## 2020-09-04 NOTE — ED Notes (Signed)
Brought pt snack and drink 

## 2020-09-04 NOTE — ED Notes (Signed)
Pt arrived requesting arm bandage reinforced. Does not wish to be triaged or see provider.

## 2020-09-15 ENCOUNTER — Other Ambulatory Visit: Payer: Self-pay

## 2020-09-15 ENCOUNTER — Encounter (HOSPITAL_COMMUNITY): Payer: Self-pay

## 2020-09-15 ENCOUNTER — Emergency Department (HOSPITAL_COMMUNITY)
Admission: EM | Admit: 2020-09-15 | Discharge: 2020-09-15 | Disposition: A | Discharge status: Home / Self Care | Payer: Medicare PPO | Attending: Emergency Medicine | Admitting: Emergency Medicine

## 2020-09-15 ENCOUNTER — Emergency Department (HOSPITAL_COMMUNITY): Payer: Medicare PPO

## 2020-09-15 DIAGNOSIS — I10 Essential (primary) hypertension: Secondary | ICD-10-CM | POA: Diagnosis not present

## 2020-09-15 DIAGNOSIS — R519 Headache, unspecified: Secondary | ICD-10-CM | POA: Diagnosis present

## 2020-09-15 DIAGNOSIS — Z79899 Other long term (current) drug therapy: Secondary | ICD-10-CM | POA: Insufficient documentation

## 2020-09-15 DIAGNOSIS — W19XXXA Unspecified fall, initial encounter: Secondary | ICD-10-CM | POA: Diagnosis not present

## 2020-09-15 DIAGNOSIS — Z96642 Presence of left artificial hip joint: Secondary | ICD-10-CM | POA: Insufficient documentation

## 2020-09-15 DIAGNOSIS — F0390 Unspecified dementia without behavioral disturbance: Secondary | ICD-10-CM | POA: Diagnosis not present

## 2020-09-15 DIAGNOSIS — Z87891 Personal history of nicotine dependence: Secondary | ICD-10-CM | POA: Diagnosis not present

## 2020-09-15 LAB — CBG MONITORING, ED: Glucose-Capillary: 94 mg/dL (ref 70–99)

## 2020-09-15 NOTE — ED Notes (Signed)
Patient transported to CT 

## 2020-09-15 NOTE — ED Notes (Signed)
c-collar removed at this time

## 2020-09-15 NOTE — Discharge Instructions (Addendum)
CT scans of the head and upper spine showed no acute traumatic injuries or brain bleed.  Please make sure she walks with assistance for the next 24 hours, and her primary care provider is notified about her visit to the ER.

## 2020-09-15 NOTE — ED Provider Notes (Addendum)
[facility]-EMERGENCY DEPT Provider Note   CSN: [id number] Arrival date & time: 09/15/20  0719     History Chief Complaint  Patient presents with  . Fall  . Headache    Heather Floyd is a 87 y.o. female with history of dementia, recurrent falls, presenting to emergency department with suspected fall.  Per EMS report, the patient was found on the ground today next to her bed.  She has dementia cannot recall any of the events.  She is denying a headache or any neck pain.  A C-spine collar was placed by EMS in route to the hospital.  Per her medical record review, the patient is not on blood thinners.  Per review of the records, the patient was seen in the emergency department approximately 10 days ago for another witnessed fall at that time, found to have an olecranon fracture of the right elbow.  She had a splint applied initially, presents today with a cast on her right arm.  HPI     Past Medical History:  Diagnosis Date  . Abnormality of gait 09/08/2014  . Carotid artery occlusion   . Concussion with loss of consciousness 09/08/2014  . Dementia (HCC)   . DJD (degenerative joint disease) of cervical spine   . Hyperlipidemia   . Hypertension   . Memory disturbance 03/29/2013  . Osteoporosis   . Personal history of colonic polyps   . RLS (restless legs syndrome)   . Sleep disorder   . TIA (transient ischemic attack)   . Varicose veins     Patient Active Problem List   Diagnosis Date Noted  . Pain in left elbow 08/10/2018  . Rupture of left triceps tendon 07/26/2018  . Closed olecranon fracture, left, initial encounter 07/24/2018  . Pubic ramus fracture (HCC) 07/24/2018  . Closed fracture of left inferior pubic ramus (HCC)   . Fall 07/23/2018  . Acute lower UTI 07/23/2018  . Palliative care encounter   . Colitis 08/25/2017  . Abdominal pain 04/08/2017  . Leukocytosis 04/08/2017  . Dementia (HCC) 04/08/2017  . Dehydration 04/08/2017  . Abnormal  urinalysis 04/08/2017  . Enteritis 04/07/2017  . Abnormality of gait 09/08/2014  . Concussion with loss of consciousness 09/08/2014  . Subdural hematoma (HCC) 08/11/2014  . Hypokalemia 08/11/2014  . SDH (subdural hematoma) (HCC) 08/11/2014  . Hyperlipidemia   . Hypertension   . RLS (restless legs syndrome)   . DJD (degenerative joint disease) of cervical spine   . TIA (transient ischemic attack)   . Carotid artery occlusion   . Essential hypertension   . Memory disturbance 03/29/2013  . Near syncope 01/13/2013  . Accelerated hypertension 01/13/2013  . Hyperkalemia 01/13/2013    Past Surgical History:  Procedure Laterality Date  . ANTERIOR AND POSTERIOR VAGINAL REPAIR W/ SACROSPINOUS LIGAMENT SUSPENSION    . CESAREAN SECTION    . ENDOVENOUS ABLATION SAPHENOUS VEIN W/ LASER    . HEMORRHOID SURGERY    . JOINT REPLACEMENT    . ORIF ELBOW FRACTURE Left 07/26/2018   Procedure: OPEN REDUCTION INTERNAL FIXATION (ORIF) ELBOW/OLECRANON FRACTURE;  Surgeon: Tarry KosXu, Naiping M, MD;  Location: [facility] OR;  Service: Orthopedics;  Laterality: Left;  . REDUCTION MAMMAPLASTY    . TOTAL HIP ARTHROPLASTY Left 10/19/2017   Procedure: ANTERIOR HEMI HIP;  Surgeon: Tarry KosXu, Naiping M, MD;  Location: [facility] OR;  Service: Orthopedics;  Laterality: Left;     OB History   No obstetric history on file.     Family  History  Problem Relation Age of Onset  . Mental illness Mother   . Alzheimer's disease Mother   . Cancer Father   . Bone cancer Father   . Dementia Sister     Social History   Tobacco Use  . Smoking status: Former Smoker    Quit date: 06/16/1980    Years since quitting: 40.2  . Smokeless tobacco: Never Used  Vaping Use  . Vaping Use: Never used  Substance Use Topics  . Alcohol use: No  . Drug use: No    Home Medications Prior to Admission medications   Medication Sig Start Date End Date Taking? Authorizing Provider  acetaminophen (TYLENOL) 325 MG tablet Take 2 tablets (650 mg total) by mouth  every 6 (six) hours as needed for mild pain or headache. Tablet should be crushed and provided in food 09/04/20   Gerhard Munch, MD  amLODipine (NORVASC) 5 MG tablet Take 5 mg by mouth at bedtime.  02/01/18   [provider]  bisacodyl (DULCOLAX) 5 MG EC tablet Take 1 tablet (5 mg total) by mouth daily as needed for moderate constipation. 07/29/18   Elgergawy, Leana Roe, MD  clindamycin (CLEOCIN) 150 MG capsule Take 1 capsule (150 mg total) by mouth every 6 (six) hours. 08/11/19   Margarita Grizzle, MD  diclofenac Sodium (VOLTAREN) 1 % GEL Apply 2 g topically 3 (three) times daily as needed (right thumb pain).    [provider]  feeding supplement, ENSURE ENLIVE, (ENSURE ENLIVE) LIQD Take 237 mLs by mouth 3 (three) times daily between meals. Patient taking differently: Take 237 mLs by mouth 3 (three) times daily as needed (feeding supplement).  07/29/18   Elgergawy, Leana Roe, MD  gabapentin (NEURONTIN) 100 MG capsule Take 100 mg by mouth 3 (three) times daily. 03/30/18   [provider]  hydrOXYzine (ATARAX/VISTARIL) 25 MG tablet Take 1 tablet (25 mg total) by mouth 3 (three) times daily as needed. Patient taking differently: Take 25 mg by mouth 2 (two) times daily. Additional 25 mg as needed 07/02/18   Butch Penny, NP  oxyCODONE (OXY IR/ROXICODONE) 5 MG immediate release tablet Take 1 tablet (5 mg total) by mouth every 6 (six) hours as needed for severe pain. Patient not taking: Reported on 11/24/2018 07/29/18   Elgergawy, Leana Roe, MD  polyethylene glycol [facility]) packet Take 17 g by mouth daily as needed for moderate constipation. 07/29/18   Elgergawy, Leana Roe, MD  QUEtiapine (SEROQUEL) 25 MG tablet Take 25 mg by mouth 2 (two) times daily. 12/25/18   [provider]  senna-docusate (SENOKOT-S) 8.6-50 MG tablet Take 3 tablets by mouth 2 (two) times daily. Patient taking differently: Take 1 tablet by mouth at bedtime as needed for mild constipation.  07/29/18    Elgergawy, Leana Roe, MD  sertraline (ZOLOFT) 100 MG tablet Take 1 tablet (100 mg total) by mouth daily. 09/21/17   Butch Penny, NP  traMADol (ULTRAM) 50 MG tablet Take 1 tablet (50 mg total) by mouth at bedtime. Patient taking differently: Take 25 mg by mouth at bedtime.  01/22/19   Charlynne Pander, MD  traZODone (DESYREL) 50 MG tablet Take 1.5 tablets (75 mg total) by mouth at bedtime. 07/02/18   Butch Penny, NP  Vitamin D, Ergocalciferol, (DRISDOL) 1.25 MG (50000 UNIT) CAPS capsule Take 50,000 Units by mouth every 7 (seven) days.    [provider]    Allergies    Aspirin and Codeine  Review of Systems   Review of  Systems  Unable to perform ROS: Dementia (level 5 caveat)    Physical Exam Updated Vital Signs BP (!) 148/76   Pulse 65   Temp 98.4 F (36.9 C) (Oral)   Resp 16   Ht 5\' 2"  (1.575 m)   Wt 50 kg   SpO2 95%   BMI 20.16 kg/m   Physical Exam Constitutional:      General: She is not in acute distress.    Comments: Pleasantly demented  HENT:     Head: Normocephalic and atraumatic.  Eyes:     Conjunctiva/sclera: Conjunctivae normal.     Pupils: Pupils are equal, round, and reactive to light.  Neck:     Comments: C spine collar in place Cardiovascular:     Rate and Rhythm: Normal rate and regular rhythm.  Pulmonary:     Effort: Pulmonary effort is normal. No respiratory distress.  Abdominal:     General: There is no distension.     Tenderness: There is no abdominal tenderness.  Musculoskeletal:     Comments: Right arm in hard cast Full ROM without pain at bilateral hips, left arm and shoulder No visible swelling or deformities of the remaining extremities or pelvis  Skin:    General: Skin is warm and dry.  Neurological:     General: No focal deficit present.     Mental Status: She is alert. Mental status is at baseline.     ED Results / Procedures / Treatments   Labs (all labs ordered are listed, but only abnormal results are  displayed) Labs Reviewed  CBG MONITORING, ED    EKG None  Radiology CT Head Wo Contrast  Result Date: 09/15/2020 CLINICAL DATA:  Unwitnessed fall with possible head trauma. EXAM: CT HEAD WITHOUT CONTRAST CT CERVICAL SPINE WITHOUT CONTRAST TECHNIQUE: Multidetector CT imaging of the head and cervical spine was performed following the standard protocol without intravenous contrast. Multiplanar CT image reconstructions of the cervical spine were also generated. COMPARISON:  06/14/2019 FINDINGS: CT HEAD FINDINGS Brain: Ventricles, cisterns and other CSF spaces are within normal. Minimal age related atrophic change. Mild chronic ischemic microvascular disease. No mass, mass effect, shift of midline structures or acute hemorrhage. No evidence of acute infarction. Vascular: No hyperdense vessel or unexpected calcification. Skull: Normal. Negative for fracture or focal lesion. Sinuses/Orbits: No acute finding. Other: None. CT CERVICAL SPINE FINDINGS Alignment: Subtle 1-2 mm degenerative anterior subluxation of C4 on C5. No posttraumatic subluxation. Skull base and vertebrae: Vertebral body heights are maintained. There is mild to moderate spondylosis of the cervical spine most prominent over the lower cervical spine. Uncovertebral joint spurring and facet arthropathy is present. Minimal left-sided neural foraminal narrowing at the C5-6 level. No acute fracture. Soft tissues and spinal canal: No prevertebral fluid or swelling. No visible canal hematoma. Disc levels: Disc space narrowing with endplate sclerosis at the C5-6 and C6-7 levels. Upper chest: No acute findings. Other: None. IMPRESSION: 1. No acute brain injury. 2. Mild chronic ischemic microvascular disease and age related atrophic change. 3. No acute cervical spine injury. 4. Mild to moderate spondylosis of the cervical spine with disc disease at the C5-6 and C6-7 levels. Minimal left-sided neural foraminal narrowing at the C5-6 level. Electronically  Signed   By: 06/16/2019 M.D.   On: 09/15/2020 09:08   CT Cervical Spine Wo Contrast  Result Date: 09/15/2020 CLINICAL DATA:  Unwitnessed fall with possible head trauma. EXAM: CT HEAD WITHOUT CONTRAST CT CERVICAL SPINE WITHOUT CONTRAST TECHNIQUE: Multidetector CT imaging  of the head and cervical spine was performed following the standard protocol without intravenous contrast. Multiplanar CT image reconstructions of the cervical spine were also generated. COMPARISON:  06/14/2019 FINDINGS: CT HEAD FINDINGS Brain: Ventricles, cisterns and other CSF spaces are within normal. Minimal age related atrophic change. Mild chronic ischemic microvascular disease. No mass, mass effect, shift of midline structures or acute hemorrhage. No evidence of acute infarction. Vascular: No hyperdense vessel or unexpected calcification. Skull: Normal. Negative for fracture or focal lesion. Sinuses/Orbits: No acute finding. Other: None. CT CERVICAL SPINE FINDINGS Alignment: Subtle 1-2 mm degenerative anterior subluxation of C4 on C5. No posttraumatic subluxation. Skull base and vertebrae: Vertebral body heights are maintained. There is mild to moderate spondylosis of the cervical spine most prominent over the lower cervical spine. Uncovertebral joint spurring and facet arthropathy is present. Minimal left-sided neural foraminal narrowing at the C5-6 level. No acute fracture. Soft tissues and spinal canal: No prevertebral fluid or swelling. No visible canal hematoma. Disc levels: Disc space narrowing with endplate sclerosis at the C5-6 and C6-7 levels. Upper chest: No acute findings. Other: None. IMPRESSION: 1. No acute brain injury. 2. Mild chronic ischemic microvascular disease and age related atrophic change. 3. No acute cervical spine injury. 4. Mild to moderate spondylosis of the cervical spine with disc disease at the C5-6 and C6-7 levels. Minimal left-sided neural foraminal narrowing at the C5-6 level. Electronically Signed   By:  Elberta Fortis M.D.   On: 09/15/2020 09:08    Procedures Procedures   Medications Ordered in ED Medications - No data to display  ED Course  I have reviewed the triage vital signs and the nursing notes.  Pertinent labs & imaging results that were available during my care of the patient were reviewed by me and considered in my medical decision making (see chart for details).  87 year old with a history of dementia, here with no acute distress.  She appears pleasantly demented on exam.  Will obtain a CT of the head and C-spine to evaluate for traumatic injury.  It is unclear whether she actually struck her head.  She is not on blood thinners.  I see no other evidence of acute trauma on my exam.  She has a hard cast in place which I suspect is likely very protective.  She is ambulatory.   Given her history of recurrent mechanical falls, suspect this to was a mechanical fall early in the morning when she was getting out of bed.  There is no indication at this time but this is related to another acute process.  She is afebrile.  No signs of infection.  Her telemetry monitor is stable.  Her vital signs are all normal.  Clinical Course as of 09/15/20 1645  Sat Sep 15, 2020  0917 IMPRESSION: 1. No acute brain injury. 2. Mild chronic ischemic microvascular disease and age related atrophic change. 3. No acute cervical spine injury. 4. Mild to moderate spondylosis of the cervical spine with disc disease at the C5-6 and C6-7 levels. Minimal left-sided neural foraminal narrowing at the C5-6 level [MT]    Clinical Course User Index [MT] Terald Sleeper, MD      Final Clinical Impression(s) / ED Diagnoses Final diagnoses:  Fall, initial encounter    Rx / DC Orders ED Discharge Orders    None       Roseland Braun, [name], MD 09/15/20 1644    Terald Sleeper, MD 09/15/20 [id number]

## 2020-09-15 NOTE — ED Triage Notes (Signed)
Coming from [facility], unwitnessed fall, complaining of head pain, no blood thinners

## 2021-02-16 ENCOUNTER — Other Ambulatory Visit: Payer: Self-pay

## 2021-02-16 ENCOUNTER — Emergency Department (HOSPITAL_COMMUNITY): Payer: Medicare PPO

## 2021-02-16 ENCOUNTER — Encounter (HOSPITAL_COMMUNITY): Payer: Self-pay

## 2021-02-16 ENCOUNTER — Emergency Department (HOSPITAL_COMMUNITY)
Admission: EM | Admit: 2021-02-16 | Discharge: 2021-02-16 | Disposition: A | Discharge status: Skilled Nursing Facility | Payer: Medicare PPO | Attending: Emergency Medicine | Admitting: Emergency Medicine

## 2021-02-16 DIAGNOSIS — T148XXA Other injury of unspecified body region, initial encounter: Secondary | ICD-10-CM

## 2021-02-16 DIAGNOSIS — Z87891 Personal history of nicotine dependence: Secondary | ICD-10-CM | POA: Diagnosis not present

## 2021-02-16 DIAGNOSIS — Z86010 Personal history of colonic polyps: Secondary | ICD-10-CM | POA: Diagnosis not present

## 2021-02-16 DIAGNOSIS — Z79899 Other long term (current) drug therapy: Secondary | ICD-10-CM | POA: Diagnosis not present

## 2021-02-16 DIAGNOSIS — F0390 Unspecified dementia without behavioral disturbance: Secondary | ICD-10-CM | POA: Diagnosis not present

## 2021-02-16 DIAGNOSIS — S0083XA Contusion of other part of head, initial encounter: Secondary | ICD-10-CM | POA: Insufficient documentation

## 2021-02-16 DIAGNOSIS — W19XXXA Unspecified fall, initial encounter: Secondary | ICD-10-CM | POA: Insufficient documentation

## 2021-02-16 DIAGNOSIS — Z96642 Presence of left artificial hip joint: Secondary | ICD-10-CM | POA: Diagnosis not present

## 2021-02-16 DIAGNOSIS — M549 Dorsalgia, unspecified: Secondary | ICD-10-CM | POA: Diagnosis not present

## 2021-02-16 DIAGNOSIS — I10 Essential (primary) hypertension: Secondary | ICD-10-CM | POA: Diagnosis not present

## 2021-02-16 DIAGNOSIS — S0990XA Unspecified injury of head, initial encounter: Secondary | ICD-10-CM | POA: Diagnosis present

## 2021-02-16 LAB — CBC WITH DIFFERENTIAL/PLATELET
Abs Immature Granulocytes: 0.02 10*3/uL (ref 0.00–0.07)
Basophils Absolute: 0.1 10*3/uL (ref 0.0–0.1)
Basophils Relative: 1 %
Eosinophils Absolute: 0.2 10*3/uL (ref 0.0–0.5)
Eosinophils Relative: 4 %
HCT: 40 % (ref 36.0–46.0)
Hemoglobin: 13.8 g/dL (ref 12.0–15.0)
Immature Granulocytes: 0 %
Lymphocytes Relative: 19 %
Lymphs Abs: 1.3 10*3/uL (ref 0.7–4.0)
MCH: 30.2 pg (ref 26.0–34.0)
MCHC: 34.5 g/dL (ref 30.0–36.0)
MCV: 87.5 fL (ref 80.0–100.0)
Monocytes Absolute: 0.7 10*3/uL (ref 0.1–1.0)
Monocytes Relative: 10 %
Neutro Abs: 4.5 10*3/uL (ref 1.7–7.7)
Neutrophils Relative %: 66 %
Platelets: 234 10*3/uL (ref 150–400)
RBC: 4.57 MIL/uL (ref 3.87–5.11)
RDW: 12.5 % (ref 11.5–15.5)
WBC: 6.9 10*3/uL (ref 4.0–10.5)
nRBC: 0 % (ref 0.0–0.2)

## 2021-02-16 LAB — BASIC METABOLIC PANEL
Anion gap: 9 (ref 5–15)
BUN: 11 mg/dL (ref 8–23)
CO2: 28 mmol/L (ref 22–32)
Calcium: 9.6 mg/dL (ref 8.9–10.3)
Chloride: 95 mmol/L — ABNORMAL LOW (ref 98–111)
Creatinine, Ser: 1.16 mg/dL — ABNORMAL HIGH (ref 0.44–1.00)
GFR, Estimated: 46 mL/min — ABNORMAL LOW (ref >60)
Glucose, Bld: 105 mg/dL — ABNORMAL HIGH (ref 70–99)
Potassium: 4.2 mmol/L (ref 3.5–5.1)
Sodium: 132 mmol/L — ABNORMAL LOW (ref 135–145)

## 2021-02-16 MED ORDER — FENTANYL CITRATE PF 50 MCG/ML IJ SOSY
50.0000 ug | PREFILLED_SYRINGE | Freq: Once | INTRAMUSCULAR | Status: AC
  Administered 2021-02-16: 50 ug via INTRAVENOUS
  Filled 2021-02-16: qty 1

## 2021-02-16 NOTE — ED Provider Notes (Signed)
[facility]GENCY [facility] Provider Note   CSN: [id number] Arrival date & time: 02/16/21  0157     History Chief Complaint - fall  Level 5 caveat due to dementia  Jilda PandaGail M Finigan is a 87 y.o. female.  The history is provided by the patient. The history is limited by the condition of the patient.  Fall This is a new problem. The problem has not changed since onset.Nothing aggravates the symptoms. Nothing relieves the symptoms.  Patient with history of dementia, hypertension presents for fall.  Patient stays in a local nursing facility in the memory care unit.  It is reported the patient fell and was found on the floor.  It was an unwitnessed fall.  Patient has  reported back pain and headache.  She had swelling to her back and her forehead.  Patient is not on anticoagulation   Patient is a DNR Past Medical History:  Diagnosis Date  . Abnormality of gait 09/08/2014  . Carotid artery occlusion   . Concussion with loss of consciousness 09/08/2014  . Dementia (HCC)   . DJD (degenerative joint disease) of cervical spine   . Hyperlipidemia   . Hypertension   . Memory disturbance 03/29/2013  . Osteoporosis   . Personal history of colonic polyps   . RLS (restless legs syndrome)   . Sleep disorder   . TIA (transient ischemic attack)   . Varicose veins     Patient Active Problem List   Diagnosis Date Noted  . Pain in left elbow 08/10/2018  . Rupture of left triceps tendon 07/26/2018  . Closed olecranon fracture, left, initial encounter 07/24/2018  . Pubic ramus fracture (HCC) 07/24/2018  . Closed fracture of left inferior pubic ramus (HCC)   . Fall 07/23/2018  . Acute lower UTI 07/23/2018  . Palliative care encounter   . Colitis 08/25/2017  . Abdominal pain 04/08/2017  . Leukocytosis 04/08/2017  . Dementia (HCC) 04/08/2017  . Dehydration 04/08/2017  . Abnormal urinalysis 04/08/2017  . Enteritis 04/07/2017  . Abnormality of gait 09/08/2014  . Concussion  with loss of consciousness 09/08/2014  . Subdural hematoma (HCC) 08/11/2014  . Hypokalemia 08/11/2014  . SDH (subdural hematoma) (HCC) 08/11/2014  . Hyperlipidemia   . Hypertension   . RLS (restless legs syndrome)   . DJD (degenerative joint disease) of cervical spine   . TIA (transient ischemic attack)   . Carotid artery occlusion   . Essential hypertension   . Memory disturbance 03/29/2013  . Near syncope 01/13/2013  . Accelerated hypertension 01/13/2013  . Hyperkalemia 01/13/2013    Past Surgical History:  Procedure Laterality Date  . ANTERIOR AND POSTERIOR VAGINAL REPAIR W/ SACROSPINOUS LIGAMENT SUSPENSION    . CESAREAN SECTION    . ENDOVENOUS ABLATION SAPHENOUS VEIN W/ LASER    . HEMORRHOID SURGERY    . JOINT REPLACEMENT    . ORIF ELBOW FRACTURE Left 07/26/2018   Procedure: OPEN REDUCTION INTERNAL FIXATION (ORIF) ELBOW/OLECRANON FRACTURE;  Surgeon: Tarry KosXu, Naiping M, MD;  Location: [facility] OR;  Service: Orthopedics;  Laterality: Left;  . REDUCTION MAMMAPLASTY    . TOTAL HIP ARTHROPLASTY Left 10/19/2017   Procedure: ANTERIOR HEMI HIP;  Surgeon: Tarry KosXu, Naiping M, MD;  Location: [facility] OR;  Service: Orthopedics;  Laterality: Left;     OB History   No obstetric history on file.     Family History  Problem Relation Age of Onset  . Mental illness Mother   . Alzheimer's disease Mother   . Cancer  Father   . Bone cancer Father   . Dementia Sister     Social History   Tobacco Use  . Smoking status: Former    Types: Cigarettes    Quit date: 06/16/1980    Years since quitting: 40.6  . Smokeless tobacco: Never  Vaping Use  . Vaping Use: Never used  Substance Use Topics  . Alcohol use: No  . Drug use: No    Home Medications Prior to Admission medications   Medication Sig Start Date End Date Taking? Authorizing Provider  acetaminophen (TYLENOL) 325 MG tablet Take 2 tablets (650 mg total) by mouth every 6 (six) hours as needed for mild pain or headache. Tablet should be crushed and  provided in food 09/04/20  Yes Gerhard Munch, MD  amLODipine (NORVASC) 5 MG tablet Take 5 mg by mouth at bedtime.  02/01/18  Yes [provider]  feeding supplement, ENSURE ENLIVE, (ENSURE ENLIVE) LIQD Take 237 mLs by mouth 3 (three) times daily between meals. Patient taking differently: Take 237 mLs by mouth 3 (three) times daily as needed (feeding supplement). 07/29/18  Yes Elgergawy, Leana Roe, MD  gabapentin (NEURONTIN) 100 MG capsule Take 100 mg by mouth 3 (three) times daily. 03/30/18  Yes [provider]  hydrOXYzine (ATARAX/VISTARIL) 25 MG tablet Take 1 tablet (25 mg total) by mouth 3 (three) times daily as needed. Patient taking differently: Take 25 mg by mouth 2 (two) times daily. 07/02/18  Yes Butch Penny, NP  loperamide (IMODIUM A-D) 2 MG tablet Take 2 mg by mouth every 4 (four) hours as needed for diarrhea or loose stools.   Yes [provider]  polyethylene glycol (MIRALAX) packet Take 17 g by mouth daily as needed for moderate constipation. Patient taking differently: Take 17 g by mouth daily. 07/29/18  Yes Elgergawy, Leana Roe, MD  QUEtiapine (SEROQUEL) 25 MG tablet Take 12.5 mg by mouth 2 (two) times daily. 12/25/18  Yes [provider]  senna-docusate (SENOKOT-S) 8.6-50 MG tablet Take 3 tablets by mouth 2 (two) times daily. Patient taking differently: Take 1 tablet by mouth at bedtime. 07/29/18  Yes Elgergawy, Leana Roe, MD  sertraline (ZOLOFT) 100 MG tablet Take 1 tablet (100 mg total) by mouth daily. 09/21/17  Yes Butch Penny, NP  terbinafine (LAMISIL) 1 % cream Apply 1 application topically See admin instructions. Apply to big toe twice daily for rash   Yes [provider]  traMADol (ULTRAM) 50 MG tablet Take 1 tablet (50 mg total) by mouth at bedtime. Patient taking differently: Take 25 mg by mouth at bedtime. 01/22/19  Yes Charlynne Pander, MD  traZODone (DESYREL) 50 MG tablet Take 1.5 tablets (75 mg total) by mouth at bedtime.  07/02/18  Yes Butch Penny, NP  Vitamin D, Ergocalciferol, (DRISDOL) 1.25 MG (50000 UNIT) CAPS capsule Take 50,000 Units by mouth every Tuesday.   Yes [provider]  bisacodyl (DULCOLAX) 5 MG EC tablet Take 1 tablet (5 mg total) by mouth daily as needed for moderate constipation. Patient not taking: Reported on 02/16/2021 07/29/18   Elgergawy, Leana Roe, MD  clindamycin (CLEOCIN) 150 MG capsule Take 1 capsule (150 mg total) by mouth every 6 (six) hours. Patient not taking: Reported on 02/16/2021 08/11/19   Margarita Grizzle, MD  oxyCODONE (OXY IR/ROXICODONE) 5 MG immediate release tablet Take 1 tablet (5 mg total) by mouth every 6 (six) hours as needed for severe pain. Patient not taking: No sig reported 07/29/18   Elgergawy, Leana Roe, MD  Allergies    Aspirin and Codeine  Review of Systems   Review of Systems  Unable to perform ROS: Dementia   Physical Exam Updated Vital Signs BP 138/86   Pulse 66   Temp 97.9 F (36.6 C) (Oral)   Resp 15   Ht 1.575 m (5\' 2" )   Wt 50 kg   SpO2 94%   BMI 20.16 kg/m   Physical Exam CONSTITUTIONAL: Elderly, frail, HEAD: Hematoma noted to forehead, no other signs of trauma EYES: EOMI/PERRL ENMT: Mucous membranes moist NECK: supple no meningeal signs SPINE/BACK:entire spine nontender, No bruising/crepitance/stepoffs noted to spine Hematoma noted to right mid back CV: S1/S2 noted, no murmurs/rubs/gallops noted LUNGS: Lungs are clear to auscultation bilaterally, no apparent distress ABDOMEN: soft, nontender NEURO: Pt is awake/alert,maex4. Pt is confused EXTREMITIES: pulses normal/equal, full ROM,pelvis stable All other extremities/joints palpated/ranged and nontender SKIN: warm, color normal PSYCH: unable to assess   ED Results / Procedures / Treatments   Labs (all labs ordered are listed, but only abnormal results are displayed) Labs Reviewed  BASIC METABOLIC PANEL - Abnormal; Notable for the following components:      Result Value    Sodium 132 (*)    Chloride 95 (*)    Glucose, Bld 105 (*)    Creatinine, Ser 1.16 (*)    GFR, Estimated 46 (*)    All other components within normal limits  CBC WITH DIFFERENTIAL/PLATELET    EKG EKG Interpretation  Date/Time:  Saturday February 16 2021 02:17:23 EDT Ventricular Rate:  75 PR Interval:  158 QRS Duration: 80 QT Interval:  441 QTC Calculation: 493 R Axis:   -30 Text Interpretation: Sinus rhythm Left axis deviation Low voltage, precordial leads Consider anterior infarct Confirmed by [id number] (Zadie Rhine) on 02/16/2021 2:30:57 AM  Radiology DG Chest Port 1 View  Result Date: 02/16/2021 CLINICAL DATA:  Fall, pain EXAM: PORTABLE CHEST 1 VIEW COMPARISON:  08/11/2019 FINDINGS: Low lung volumes. Left base opacity, likely atelectasis. Heart is normal size. Aortic atherosclerosis. No visible rib fracture or pneumothorax. IMPRESSION: Low lung volumes, bibasilar atelectasis. Electronically Signed   By: 08/13/2019 M.D.   On: 02/16/2021 03:04    Procedures Procedures   Medications Ordered in ED Medications  fentaNYL (SUBLIMAZE) injection 50 mcg (50 mcg Intravenous Given 02/16/21 0259)    ED Course  I have reviewed the triage vital signs and the nursing notes.  Pertinent labs & imaging results that were available during my care of the patient were reviewed by me and considered in my medical decision making (see chart for details).    MDM Rules/Calculators/A&P                           Pt presents for unwitnessed fall She has h/o dementia Daughter does not want to pursue CT imaging for now Will obtain, check labs and reassess 4:40 AM Labs overall reassuring.  X-ray does not show any obvious fractures. Patient has been resting comfortably.  The hematoma has not expanded.  Daughter does not wish to have any further interventions at this time. Given the stability of the size of the hematoma, she is not on anticoagulation, and labs were reassuring, I suspect this is  self-limited.  Daughter is aware that I cannot fully rule out head injury without a CT scan, or any thoracic trauma without CT She understands this and still prefers not to have any further testing She will take back to the facility I  attempted to call [facility] but no one answered Final Clinical Impression(s) / ED Diagnoses Final diagnoses:  Fall, initial encounter  Hematoma    Rx / DC Orders ED Discharge Orders     None        Zadie Rhine, MD 02/16/21 [phone]

## 2021-02-16 NOTE — Discharge Instructions (Addendum)
If the hematoma on the back begins to expand in size and becomes more painful, please send back to the ER for further evaluation

## 2021-02-16 NOTE — ED Triage Notes (Signed)
Pt bib EMS from [facility], memory care unit for unwitnessed fall. C/o back pain, swollen area noted to R mid-back & hematoma notted to L forehead. Not on blood thinners. Alert and oriented at  baseline-- confusion per dementia.   EMS vitals: 146/62 HR 70 CBG 110 95% room air RR 20

## 2021-02-16 NOTE — ED Notes (Signed)
Daughter at bedside requesting pt not be attached to BP cuff.

## 2021-04-25 ENCOUNTER — Emergency Department (HOSPITAL_COMMUNITY)
Admission: EM | Admit: 2021-04-25 | Discharge: 2021-04-25 | Disposition: A | Discharge status: Home / Self Care | Payer: Medicare PPO | Attending: Emergency Medicine | Admitting: Emergency Medicine

## 2021-04-25 ENCOUNTER — Encounter (HOSPITAL_COMMUNITY): Payer: Self-pay

## 2021-04-25 ENCOUNTER — Emergency Department (HOSPITAL_COMMUNITY): Payer: Medicare PPO

## 2021-04-25 DIAGNOSIS — S32000D Wedge compression fracture of unspecified lumbar vertebra, subsequent encounter for fracture with routine healing: Secondary | ICD-10-CM | POA: Insufficient documentation

## 2021-04-25 DIAGNOSIS — S29002A Unspecified injury of muscle and tendon of back wall of thorax, initial encounter: Secondary | ICD-10-CM | POA: Diagnosis present

## 2021-04-25 DIAGNOSIS — Z87891 Personal history of nicotine dependence: Secondary | ICD-10-CM | POA: Diagnosis not present

## 2021-04-25 DIAGNOSIS — Y92129 Unspecified place in nursing home as the place of occurrence of the external cause: Secondary | ICD-10-CM | POA: Insufficient documentation

## 2021-04-25 DIAGNOSIS — F0390 Unspecified dementia without behavioral disturbance: Secondary | ICD-10-CM | POA: Diagnosis not present

## 2021-04-25 DIAGNOSIS — Z79899 Other long term (current) drug therapy: Secondary | ICD-10-CM | POA: Insufficient documentation

## 2021-04-25 DIAGNOSIS — Z8673 Personal history of transient ischemic attack (TIA), and cerebral infarction without residual deficits: Secondary | ICD-10-CM | POA: Diagnosis not present

## 2021-04-25 DIAGNOSIS — W19XXXA Unspecified fall, initial encounter: Secondary | ICD-10-CM | POA: Insufficient documentation

## 2021-04-25 DIAGNOSIS — Z96642 Presence of left artificial hip joint: Secondary | ICD-10-CM | POA: Diagnosis not present

## 2021-04-25 DIAGNOSIS — I10 Essential (primary) hypertension: Secondary | ICD-10-CM | POA: Diagnosis not present

## 2021-04-25 NOTE — ED Triage Notes (Signed)
Patient arrives from [facility]  via EMS after unwitnessed fall that occurred around 0100.  Pt complains of mid back pain, tender to touch. No bruising or obvious deformity. No LOC, did not hit head, no blood thinners. A&O x 2, Staff reports pt at her normal baseline, no mental status changes.

## 2021-04-25 NOTE — Discharge Instructions (Addendum)
For pain control you may take at 1000 mg of Tylenol every 8 hours as needed.  Crush pills and place in Boost shake.

## 2021-04-25 NOTE — ED Provider Notes (Signed)
[facility] DEPT Provider Note  CSN: [id number] Arrival date & time: 04/25/21 0225  Chief Complaint(s) Fall Patient arrives from [facility]  via EMS after unwitnessed fall that occurred around 0100.  Pt complains of mid back pain, tender to touch. No bruising or obvious deformity. No LOC, did not hit head, no blood thinners. A&O x 2, Staff reports pt at her normal baseline, no mental status changes HPI Heather Floyd is a 87 y.o. female who presents after an unwitnessed fall in the skilled nursing facility.  Apparently she was complaining of mid back pain.  Here she is complaining of "cold."  Remainder of history, ROS, and physical exam limited due to patient's condition (dementia). Additional information was obtained from EMS.   Level V Caveat.   HPI  Past Medical History Past Medical History:  Diagnosis Date   Abnormality of gait 09/08/2014   Carotid artery occlusion    Concussion with loss of consciousness 09/08/2014   Dementia ([place])    DJD (degenerative joint disease) of cervical spine    Hyperlipidemia    Hypertension    Memory disturbance 03/29/2013   Osteoporosis    Personal history of colonic polyps    RLS (restless legs syndrome)    Sleep disorder    TIA (transient ischemic attack)    Varicose veins    Patient Active Problem List   Diagnosis Date Noted   Pain in left elbow 08/10/2018   Rupture of left triceps tendon 07/26/2018   Closed olecranon fracture, left, initial encounter 07/24/2018   Pubic ramus fracture ([place]) 07/24/2018   Closed fracture of left inferior pubic ramus ([place])    Fall 07/23/2018   Acute lower UTI 07/23/2018   Palliative care encounter    Colitis 08/25/2017   Abdominal pain 04/08/2017   Leukocytosis 04/08/2017   Dementia ([place]) 04/08/2017   Dehydration 04/08/2017   Abnormal urinalysis 04/08/2017   Enteritis 04/07/2017   Abnormality of gait 09/08/2014   Concussion with loss of consciousness 09/08/2014    Subdural hematoma ([place]) 08/11/2014   Hypokalemia 08/11/2014   SDH (subdural hematoma) 08/11/2014   Hyperlipidemia    Hypertension    RLS (restless legs syndrome)    DJD (degenerative joint disease) of cervical spine    TIA (transient ischemic attack)    Carotid artery occlusion    Essential hypertension    Memory disturbance 03/29/2013   Near syncope 01/13/2013   Accelerated hypertension 01/13/2013   Hyperkalemia 01/13/2013   Home Medication(s) Prior to Admission medications   Medication Sig Start Date End Date Taking? Authorizing Provider  acetaminophen (TYLENOL) 325 MG tablet Take 2 tablets (650 mg total) by mouth every 6 (six) hours as needed for mild pain or headache. Tablet should be crushed and provided in food 09/04/20  Yes Carmin Muskrat, MD  amLODipine (NORVASC) 5 MG tablet Take 5 mg by mouth at bedtime.  02/01/18  Yes [provider]  gabapentin (NEURONTIN) 100 MG capsule Take 100 mg by mouth 3 (three) times daily. 03/30/18  Yes [provider]  hydrOXYzine (ATARAX/VISTARIL) 25 MG tablet Take 1 tablet (25 mg total) by mouth 3 (three) times daily as needed. Patient taking differently: Take 25 mg by mouth 2 (two) times daily. 07/02/18  Yes Ward Givens, NP  polyethylene glycol (MIRALAX) packet Take 17 g by mouth daily as needed for moderate constipation. Patient taking differently: Take 17 g by mouth daily. 07/29/18  Yes Elgergawy, Silver Huguenin, MD  QUEtiapine (SEROQUEL) 25 MG tablet Take 12.5  mg by mouth 2 (two) times daily. 12/25/18  Yes [provider]  senna-docusate (SENOKOT-S) 8.6-50 MG tablet Take 3 tablets by mouth 2 (two) times daily. Patient taking differently: Take 1 tablet by mouth every evening. 07/29/18  Yes Elgergawy, Silver Huguenin, MD  sertraline (ZOLOFT) 100 MG tablet Take 1 tablet (100 mg total) by mouth daily. 09/21/17  Yes Ward Givens, NP  terbinafine (LAMISIL) 1 % cream Apply 1 application topically See admin instructions. Apply to big toe  twice daily for rash   Yes [provider]  traMADol (ULTRAM) 50 MG tablet Take 1 tablet (50 mg total) by mouth at bedtime. Patient taking differently: Take 25 mg by mouth at bedtime. 01/22/19  Yes Drenda Freeze, MD  traZODone (DESYREL) 50 MG tablet Take 1.5 tablets (75 mg total) by mouth at bedtime. 07/02/18  Yes Ward Givens, NP  Vitamin D, Ergocalciferol, (DRISDOL) 1.25 MG (50000 UNIT) CAPS capsule Take 50,000 Units by mouth every Tuesday.   Yes [provider]  feeding supplement, ENSURE ENLIVE, (ENSURE ENLIVE) LIQD Take 237 mLs by mouth 3 (three) times daily between meals. Patient taking differently: Take 237 mLs by mouth 3 (three) times daily as needed (feeding supplement). 07/29/18   Elgergawy, Silver Huguenin, MD  loperamide (IMODIUM A-D) 2 MG tablet Take 2 mg by mouth every 4 (four) hours as needed for diarrhea or loose stools.    [provider]                                                                                                                                    Past Surgical History Past Surgical History:  Procedure Laterality Date   ANTERIOR AND POSTERIOR VAGINAL REPAIR W/ SACROSPINOUS LIGAMENT SUSPENSION     CESAREAN SECTION     ENDOVENOUS ABLATION SAPHENOUS VEIN W/ LASER     HEMORRHOID SURGERY     JOINT REPLACEMENT     ORIF ELBOW FRACTURE Left 07/26/2018   Procedure: OPEN REDUCTION INTERNAL FIXATION (ORIF) ELBOW/OLECRANON FRACTURE;  Surgeon: Leandrew Koyanagi, MD;  Location: [facility];  Service: Orthopedics;  Laterality: Left;   REDUCTION MAMMAPLASTY     TOTAL HIP ARTHROPLASTY Left 10/19/2017   Procedure: ANTERIOR HEMI HIP;  Surgeon: Leandrew Koyanagi, MD;  Location: [facility];  Service: Orthopedics;  Laterality: Left;   Family History Family History  Problem Relation Age of Onset   Mental illness Mother    Alzheimer's disease Mother    Cancer Father    Bone cancer Father    Dementia Sister     Social History Social History   Tobacco Use   Smoking  status: Former    Types: Cigarettes    Quit date: 06/16/1980    Years since quitting: 40.8   Smokeless tobacco: Never  Vaping Use   Vaping Use: Never used  Substance Use Topics   Alcohol use: No   Drug use: No   Allergies  Aspirin and Codeine  Review of Systems Review of Systems Unable to obtain due to dementia Physical Exam Vital Signs  I have reviewed the triage vital signs BP 138/72   Pulse 79   Temp 98.7 F (37.1 C) (Oral)   Resp 16   SpO2 90%   Physical Exam Constitutional:      General: She is not in acute distress.    Appearance: She is well-developed. She is not diaphoretic.  HENT:     Head: Normocephalic and atraumatic.     Right Ear: External ear normal.     Left Ear: External ear normal.     Nose: Nose normal.  Eyes:     General: No scleral icterus.       Right eye: No discharge.        Left eye: No discharge.     Conjunctiva/sclera: Conjunctivae normal.     Pupils: Pupils are equal, round, and reactive to light.  Cardiovascular:     Rate and Rhythm: Normal rate and regular rhythm.     Pulses:          Radial pulses are 2+ on the right side and 2+ on the left side.       Dorsalis pedis pulses are 2+ on the right side and 2+ on the left side.     Heart sounds: Normal heart sounds. No murmur heard.   No friction rub. No gallop.  Pulmonary:     Effort: Pulmonary effort is normal. No respiratory distress.     Breath sounds: Normal breath sounds. No stridor. No wheezing.  Abdominal:     General: There is no distension.     Palpations: Abdomen is soft.     Tenderness: There is no abdominal tenderness.  Musculoskeletal:        General: No tenderness.     Cervical back: Normal range of motion and neck supple. No bony tenderness.     Thoracic back: No bony tenderness.     Lumbar back: No bony tenderness.     Comments: Clavicles stable. Chest stable to AP/Lat compression. Pelvis stable to Lat compression. No obvious extremity deformity. No chest or  abdominal wall contusion.  Skin:    General: Skin is warm and dry.     Findings: No erythema or rash.  Neurological:     Mental Status: She is alert and oriented to person, place, and time.     Comments: Moving all extremities    ED Results and Treatments Labs (all labs ordered are listed, but only abnormal results are displayed) Labs Reviewed - No data to display                                                                                                                       EKG  EKG Interpretation  Date/Time:    Ventricular Rate:    PR Interval:    QRS Duration:   QT Interval:    QTC Calculation:   R Axis:  Text Interpretation:         Radiology DG Thoracic Spine 2 View  Result Date: 04/25/2021 CLINICAL DATA:  Unwitnessed fall at nursing home EXAM: THORACIC SPINE 2 VIEWS COMPARISON:  02/16/2021 FINDINGS: Chronic upper lumbar compression fracture also seen on 08/11/2019 chest x-ray. No definite acute fracture, but very limited by underpenetration and osteopenia. IMPRESSION: Very limited study due to underpenetration and osteopenia, suggest noncontrast CT correlation. Electronically Signed   By: Tiburcio Pea M.D.   On: 04/25/2021 04:20   DG Lumbar Spine 2-3 Views  Result Date: 04/25/2021 CLINICAL DATA:  Unwitnessed fall with back pain EXAM: LUMBAR SPINE - 2-3 VIEW COMPARISON:  11/24/2018 FINDINGS: Remote L2 compression fracture with mild height loss. L1 compression fracture with advanced anterior height loss, also chronic when correlated with 08/11/2019 chest radiograph. Lower lumbar spine degeneration with mild L4-5 anterolisthesis. Mild levoscoliosis. Remote left inferior pubic ramus fracture when correlated with 2020 radiography. Atherosclerosis. Left right labeling mismatch when compared to prior. IMPRESSION: Remote L1 and L2 compression fractures.  No acute finding. Electronically Signed   By: Tiburcio Pea M.D.   On: 04/25/2021 04:24    Pertinent labs &  imaging results that were available during my care of the patient were reviewed by me and considered in my medical decision making (see MDM for details).  Medications Ordered in ED Medications - No data to display                                                                                                                                   Procedures Procedures  (including critical care time)  Medical Decision Making / ED Course I have reviewed the nursing notes for this encounter and the patient's prior records (if available in EHR or on provided paperwork).  Heather Floyd was evaluated in Emergency Department on 04/25/2021 for the symptoms described in the history of present illness. She was evaluated in the context of the global COVID-19 pandemic, which necessitated consideration that the patient might be at risk for infection with the SARS-CoV-2 virus that causes COVID-19. Institutional protocols and algorithms that pertain to the evaluation of patients at risk for COVID-19 are in a state of rapid change based on information released by regulatory bodies including the CDC and federal and state organizations. These policies and algorithms were followed during the patient's care in the ED.     Unwitnessed fall at a skilled nursing facility.  Patient is not anticoagulated.  No obvious signs of trauma noted on exam. Given complaint of back pain, will obtain plain films.  Pertinent labs & imaging results that were available during my care of the patient were reviewed by me and considered in my medical decision making:  Plain films negative for any acute injuries. Did note remote compression fractures. No need for other imaging or work-up at this time.  Final Clinical Impression(s) / ED Diagnoses Final diagnoses:  Fall at  nursing home, initial encounter  Closed compression fracture of lumbosacral spine with routine healing, subsequent encounter   The patient appears reasonably screened  and/or stabilized for discharge and I doubt any other medical condition or other [facility] requiring further screening, evaluation, or treatment in the ED at this time prior to discharge. Safe for discharge with strict return precautions.  Disposition: Discharge  Condition: Good  I have discussed the results, Dx and Tx plan with the patient/family who expressed understanding and agree(s) with the plan. Discharge instructions discussed at length. The patient/family was given strict return precautions who verbalized understanding of the instructions. No further questions at time of discharge.    ED Discharge Orders     None        Follow Up: [facility], [facility] [place] [address] [phone]  Call  as needed    This chart was dictated using voice recognition software.  Despite best efforts to proofread,  errors can occur which can change the documentation meaning.    Fatima Blank, MD 04/25/21 [phone]

## 2021-05-04 ENCOUNTER — Encounter (HOSPITAL_COMMUNITY): Payer: Self-pay | Admitting: Emergency Medicine

## 2021-05-04 ENCOUNTER — Emergency Department (HOSPITAL_COMMUNITY): Payer: Medicare PPO

## 2021-05-04 ENCOUNTER — Emergency Department (HOSPITAL_COMMUNITY)
Admission: EM | Admit: 2021-05-04 | Discharge: 2021-05-04 | Disposition: A | Discharge status: Home / Self Care | Payer: Medicare PPO | Attending: Emergency Medicine | Admitting: Emergency Medicine

## 2021-05-04 ENCOUNTER — Other Ambulatory Visit: Payer: Self-pay

## 2021-05-04 DIAGNOSIS — I10 Essential (primary) hypertension: Secondary | ICD-10-CM | POA: Insufficient documentation

## 2021-05-04 DIAGNOSIS — Z87891 Personal history of nicotine dependence: Secondary | ICD-10-CM | POA: Diagnosis not present

## 2021-05-04 DIAGNOSIS — Z96642 Presence of left artificial hip joint: Secondary | ICD-10-CM | POA: Diagnosis not present

## 2021-05-04 DIAGNOSIS — R1110 Vomiting, unspecified: Secondary | ICD-10-CM | POA: Diagnosis not present

## 2021-05-04 DIAGNOSIS — Z79899 Other long term (current) drug therapy: Secondary | ICD-10-CM | POA: Insufficient documentation

## 2021-05-04 DIAGNOSIS — R059 Cough, unspecified: Secondary | ICD-10-CM | POA: Diagnosis present

## 2021-05-04 DIAGNOSIS — F0390 Unspecified dementia without behavioral disturbance: Secondary | ICD-10-CM | POA: Diagnosis not present

## 2021-05-04 DIAGNOSIS — R051 Acute cough: Secondary | ICD-10-CM

## 2021-05-04 LAB — CBC WITH DIFFERENTIAL/PLATELET
Abs Immature Granulocytes: 0.04 10*3/uL (ref 0.00–0.07)
Basophils Absolute: 0.1 10*3/uL (ref 0.0–0.1)
Basophils Relative: 1 %
Eosinophils Absolute: 0.3 10*3/uL (ref 0.0–0.5)
Eosinophils Relative: 4 %
HCT: 40.6 % (ref 36.0–46.0)
Hemoglobin: 13.8 g/dL (ref 12.0–15.0)
Immature Granulocytes: 1 %
Lymphocytes Relative: 13 %
Lymphs Abs: 1 10*3/uL (ref 0.7–4.0)
MCH: 30.3 pg (ref 26.0–34.0)
MCHC: 34 g/dL (ref 30.0–36.0)
MCV: 89.2 fL (ref 80.0–100.0)
Monocytes Absolute: 0.6 10*3/uL (ref 0.1–1.0)
Monocytes Relative: 8 %
Neutro Abs: 5.6 10*3/uL (ref 1.7–7.7)
Neutrophils Relative %: 73 %
Platelets: 293 10*3/uL (ref 150–400)
RBC: 4.55 MIL/uL (ref 3.87–5.11)
RDW: 13 % (ref 11.5–15.5)
WBC: 7.6 10*3/uL (ref 4.0–10.5)
nRBC: 0 % (ref 0.0–0.2)

## 2021-05-04 LAB — COMPREHENSIVE METABOLIC PANEL
ALT: 12 U/L (ref 0–44)
AST: 30 U/L (ref 15–41)
Albumin: 3.9 g/dL (ref 3.5–5.0)
Alkaline Phosphatase: 65 U/L (ref 38–126)
Anion gap: 9 (ref 5–15)
BUN: 14 mg/dL (ref 8–23)
CO2: 26 mmol/L (ref 22–32)
Calcium: 9.3 mg/dL (ref 8.9–10.3)
Chloride: 100 mmol/L (ref 98–111)
Creatinine, Ser: 1.02 mg/dL — ABNORMAL HIGH (ref 0.44–1.00)
GFR, Estimated: 54 mL/min — ABNORMAL LOW (ref >60)
Glucose, Bld: 104 mg/dL — ABNORMAL HIGH (ref 70–99)
Potassium: 5.3 mmol/L — ABNORMAL HIGH (ref 3.5–5.1)
Sodium: 135 mmol/L (ref 135–145)
Total Bilirubin: 1.1 mg/dL (ref 0.3–1.2)
Total Protein: 7.2 g/dL (ref 6.5–8.1)

## 2021-05-04 MED ORDER — SODIUM CHLORIDE 0.9 % IV BOLUS
500.0000 mL | Freq: Once | INTRAVENOUS | Status: AC
  Administered 2021-05-04: 500 mL via INTRAVENOUS

## 2021-05-04 NOTE — ED Provider Notes (Signed)
[facility] DEPT Provider Note   CSN: [id number] Arrival date & time: 05/04/21  [id number]     History Chief Complaint  Patient presents with   Cough   Emesis   Level 5 caveat: Dementia  Heather Floyd is a 87 y.o. female with a past medical history hypertension presents to the ED brought [facility] complaining of cough onset prior to arrival.  Per staff [facility] facility tech reports patient took 2 bites of her breakfast with an episode of emesis following.  Daughter is at bedside, however unaware of why patient was brought into the ED. Tech denies patient complaining of any other symptoms.  Daughter reports patient has allergies to aspirin and codeine.    The history is provided by the nursing home and a relative. No language interpreter was used.      Past Medical History:  Diagnosis Date   Abnormality of gait 09/08/2014   Carotid artery occlusion    Concussion with loss of consciousness 09/08/2014   Dementia (HCC)    DJD (degenerative joint disease) of cervical spine    Hyperlipidemia    Hypertension    Memory disturbance 03/29/2013   Osteoporosis    Personal history of colonic polyps    RLS (restless legs syndrome)    Sleep disorder    TIA (transient ischemic attack)    Varicose veins     Patient Active Problem List   Diagnosis Date Noted   Pain in left elbow 08/10/2018   Rupture of left triceps tendon 07/26/2018   Closed olecranon fracture, left, initial encounter 07/24/2018   Pubic ramus fracture ([place]) 07/24/2018   Closed fracture of left inferior pubic ramus ([place])    Fall 07/23/2018   Acute lower UTI 07/23/2018   Palliative care encounter    Colitis 08/25/2017   Abdominal pain 04/08/2017   Leukocytosis 04/08/2017   Dementia ([place]) 04/08/2017   Dehydration 04/08/2017   Abnormal urinalysis 04/08/2017   Enteritis 04/07/2017   Abnormality of gait 09/08/2014   Concussion with loss of consciousness 09/08/2014    Subdural hematoma (HCC) 08/11/2014   Hypokalemia 08/11/2014   SDH (subdural hematoma) 08/11/2014   Hyperlipidemia    Hypertension    RLS (restless legs syndrome)    DJD (degenerative joint disease) of cervical spine    TIA (transient ischemic attack)    Carotid artery occlusion    Essential hypertension    Memory disturbance 03/29/2013   Near syncope 01/13/2013   Accelerated hypertension 01/13/2013   Hyperkalemia 01/13/2013    Past Surgical History:  Procedure Laterality Date   ANTERIOR AND POSTERIOR VAGINAL REPAIR W/ SACROSPINOUS LIGAMENT SUSPENSION     CESAREAN SECTION     ENDOVENOUS ABLATION SAPHENOUS VEIN W/ LASER     HEMORRHOID SURGERY     JOINT REPLACEMENT     ORIF ELBOW FRACTURE Left 07/26/2018   Procedure: OPEN REDUCTION INTERNAL FIXATION (ORIF) ELBOW/OLECRANON FRACTURE;  Surgeon: Leandrew Koyanagi, MD;  Location: [facility];  Service: Orthopedics;  Laterality: Left;   REDUCTION MAMMAPLASTY     TOTAL HIP ARTHROPLASTY Left 10/19/2017   Procedure: ANTERIOR HEMI HIP;  Surgeon: Leandrew Koyanagi, MD;  Location: [facility];  Service: Orthopedics;  Laterality: Left;     OB History   No obstetric history on file.     Family History  Problem Relation Age of Onset   Mental illness Mother    Alzheimer's disease Mother    Cancer Father    Bone  cancer Father    Dementia Sister     Social History   Tobacco Use   Smoking status: Former    Types: Cigarettes    Quit date: 06/16/1980    Years since quitting: 40.9   Smokeless tobacco: Never  Vaping Use   Vaping Use: Never used  Substance Use Topics   Alcohol use: No   Drug use: No    Home Medications Prior to Admission medications   Medication Sig Start Date End Date Taking? Authorizing Provider  acetaminophen (TYLENOL) 325 MG tablet Take 2 tablets (650 mg total) by mouth every 6 (six) hours as needed for mild pain or headache. Tablet should be crushed and provided in food 09/04/20  Yes Carmin Muskrat, MD  amLODipine (NORVASC) 5 MG  tablet Take 5 mg by mouth at bedtime.  02/01/18  Yes [provider]  feeding supplement, ENSURE ENLIVE, (ENSURE ENLIVE) LIQD Take 237 mLs by mouth 3 (three) times daily between meals. Patient taking differently: Take 237 mLs by mouth 3 (three) times daily as needed (feeding supplement). 07/29/18  Yes Elgergawy, Silver Huguenin, MD  gabapentin (NEURONTIN) 100 MG capsule Take 100 mg by mouth 3 (three) times daily. 03/30/18  Yes [provider]  hydrOXYzine (ATARAX/VISTARIL) 25 MG tablet Take 1 tablet (25 mg total) by mouth 3 (three) times daily as needed. Patient taking differently: Take 25 mg by mouth 2 (two) times daily. 07/02/18  Yes Ward Givens, NP  loperamide (IMODIUM A-D) 2 MG tablet Take 2 mg by mouth every 4 (four) hours as needed for diarrhea or loose stools.   Yes [provider]  polyethylene glycol (MIRALAX) packet Take 17 g by mouth daily as needed for moderate constipation. Patient taking differently: Take 17 g by mouth daily. 07/29/18  Yes Elgergawy, Silver Huguenin, MD  QUEtiapine (SEROQUEL) 25 MG tablet Take 12.5 mg by mouth 2 (two) times daily. 12/25/18  Yes [provider]  senna-docusate (SENOKOT-S) 8.6-50 MG tablet Take 3 tablets by mouth 2 (two) times daily. Patient taking differently: Take 1 tablet by mouth every evening. 07/29/18  Yes Elgergawy, Silver Huguenin, MD  sertraline (ZOLOFT) 100 MG tablet Take 1 tablet (100 mg total) by mouth daily. 09/21/17  Yes Ward Givens, NP  terbinafine (LAMISIL) 1 % cream Apply 1 application topically See admin instructions. Apply to big toe twice daily for rash   Yes [provider]  traMADol (ULTRAM) 50 MG tablet Take 1 tablet (50 mg total) by mouth at bedtime. Patient taking differently: Take 25 mg by mouth at bedtime. 01/22/19  Yes Drenda Freeze, MD  traZODone (DESYREL) 50 MG tablet Take 1.5 tablets (75 mg total) by mouth at bedtime. 07/02/18  Yes Ward Givens, NP  Vitamin D, Ergocalciferol, (DRISDOL) 1.25 MG  (50000 UNIT) CAPS capsule Take 50,000 Units by mouth every Tuesday.   Yes [provider]    Allergies    Aspirin and Codeine  Review of Systems   Review of Systems  Unable to perform ROS: Dementia   Physical Exam Updated Vital Signs BP (!) 119/55   Pulse 66   Temp (!) 97.4 F (36.3 C) (Oral) Comment: Simultaneous filing. User may not have seen previous data. Comment (Src): Simultaneous filing. User may not have seen previous data.  Resp 18   Ht 5\' 2"  (1.575 m)   Wt 50 kg   SpO2 93%   BMI 20.16 kg/m   Physical Exam Vitals and nursing note reviewed.  Constitutional:  General: She is not in acute distress.    Appearance: She is not diaphoretic.  HENT:     Head: Normocephalic and atraumatic.     Nose: Nose normal.     Mouth/Throat:     Mouth: Mucous membranes are moist.     Pharynx: Oropharynx is clear. No oropharyngeal exudate.  Eyes:     General: No scleral icterus.    Extraocular Movements: Extraocular movements intact.     Conjunctiva/sclera: Conjunctivae normal.  Cardiovascular:     Rate and Rhythm: Normal rate and regular rhythm.     Pulses: Normal pulses.          Radial pulses are 2+ on the right side and 2+ on the left side.       Dorsalis pedis pulses are 2+ on the right side and 2+ on the left side.       Posterior tibial pulses are 2+ on the right side and 2+ on the left side.     Heart sounds: Normal heart sounds.  Pulmonary:     Effort: Pulmonary effort is normal. No respiratory distress.     Breath sounds: Normal breath sounds. No wheezing.  Abdominal:     General: Bowel sounds are normal.     Palpations: Abdomen is soft. There is no mass.     Tenderness: There is no abdominal tenderness. There is no guarding or rebound.  Musculoskeletal:        General: Normal range of motion.     Cervical back: Normal range of motion and neck supple.     Right lower leg: No edema.     Left lower leg: No edema.  Skin:    General: Skin is warm and  dry.     Capillary Refill: Capillary refill takes less than 2 seconds.  Neurological:     Mental Status: She is alert.  Psychiatric:        Behavior: Behavior normal.    ED Results / Procedures / Treatments   Labs (all labs ordered are listed, but only abnormal results are displayed) Labs Reviewed  COMPREHENSIVE METABOLIC PANEL - Abnormal; Notable for the following components:      Result Value   Potassium 5.3 (*)    Glucose, Bld 104 (*)    Creatinine, Ser 1.02 (*)    GFR, Estimated 54 (*)    All other components within normal limits  CBC WITH DIFFERENTIAL/PLATELET    EKG EKG Interpretation  Date/Time:  Saturday May 04 2021 10:56:53 EST Ventricular Rate:  67 PR Interval:  167 QRS Duration: 76 QT Interval:  444 QTC Calculation: 469 R Axis:   -22 Text Interpretation: Sinus rhythm Borderline left axis deviation Low voltage, precordial leads Probable anteroseptal infarct, old Similar to previous Confirmed by Coralee Pesa [phone]) on 05/04/2021 11:59:55 AM  Radiology DG Chest 2 View  Result Date: 05/04/2021 CLINICAL DATA:  Cough. EXAM: CHEST - 2 VIEW COMPARISON:  February 16, 2021 and August 11, 2019 FINDINGS: Mild bibasilar opacities are identified, right greater than left. The heart, hila, mediastinum, lungs, and pleura are otherwise unremarkable. IMPRESSION: Mild bibasilar opacities could represent atelectasis or developing infiltrate/pneumonia. Recommend short-term follow-up imaging to ensure resolution. Electronically Signed   By: Gerome Sam III M.D.   On: 05/04/2021 11:23    Procedures Procedures   Medications Ordered in ED Medications  sodium chloride 0.9 % bolus 500 mL (0 mLs Intravenous Stopped 05/04/21 1324)    ED Course  I have reviewed the triage vital  signs and the nursing notes.  Pertinent labs & imaging results that were available during my care of the patient were reviewed by me and considered in my medical decision making (see chart for  details).  Clinical Course as of 05/04/21 1348  Sat May 04, 2021  [facility]. Spoke with [employer] who noted that this morning, she ate 2 bites of food and vomited her food. Spitting up mucus. Taking deep breaths to try and cough stuff up. No new meds, foods.  [SB]  1223 Re-evaluated patient with Attending. Discussed remaining treatment course with daughter at bedside. Daughter reports that the patient is a small eater at baseline with baseline swallowing concerns. Daughter is agreeable.  [SB]  1226 COVID and flu swab, urinalysis, and swallow screen discontinued. Will monitor and ensure patient swallowing to daughters noted baseline prior to discharge. [SB]  1323 Reevaluated patient.  Patient drinking diet Coke and eating graham crackers without difficulty.  Daughter reports patient is back at baseline with eating and drinking.  Patient appears safe for discharge at this time. [SB]    Clinical Course User Index [SB] Corinda Ammon A, PA-C   MDM Rules/Calculators/A&P                           Presents to the ED via EMS complaining of an episode of emesis onset this morning. Pt with history of dementia, daughter at bedside. Spoke with med tech at [facility].  Facility reported patient took 2 bites of her food and then had an episode of emesis.  Patient then began to cough following. Facility denies sick contacts, new meds or foods.  Differential diagnosis includes COVID, flu, viral URI with cough, pneumonia. On exam patient without acute cardiovascular, pulmonary, abdominal exam findings. EKG without acute ST/T changes.  Chest x-ray notable for mild bibasilar opacities, likely developing pneumonia.  However chest x-ray from 2021 notable for streaky opacity likely pneumonia at that time.  Patient vital signs stable, patient afebrile, no concern for SIRS, sepsis, or pneumonia at this time.  Labs showed potassium mildly elevated at 5.3 otherwise without acute findings.   Patient given IV fluid bolus.   Case discussed with attending, attending notes due to patient being afebrile, no elevated WBCs, labs acutely unremarkable less likely concern for pneumonia at this time. Patient afebrile, likely isolated coughing incidence. Discussed with patient's daughter regarding chest x-ray findings similar in appearance to last years chest x-ray findings.  Daughter understanding.  COVID and flu swab discontinued.  Daughter made aware and agrees. Daughter at bedside and treatment plan discussed with daughter, daughter acknowledges and verbalizes understanding that this is likely isolated to coughing incident.   Discussed supportive care and strict return precautions including fever, cough, worsening swallowing.  Discussed with daughter to have patient follow-up with primary care provider for evaluation regarding today's visit and for follow up regarding slight hyperkalemia.  Daughter acknowledges and voices understanding.  Patient appears safe for discharge at this time.  Follow-up as indicated in discharge paperwork.  Final Clinical Impression(s) / ED Diagnoses Final diagnoses:  Acute cough    Rx / DC Orders ED Discharge Orders     None        Sante Biedermann A, PA-C 05/04/21 1349    Horton, Alvin Critchley, DO 05/04/21 1526

## 2021-05-04 NOTE — Discharge Instructions (Addendum)
Follow-up with the doctor at her facility regarding recheck of potassium levels.  Ensure to maintain fluid intake.  Return to the ED if you are experiencing worsening fever, cough, swallowing issues.

## 2021-05-04 NOTE — ED Notes (Signed)
Attempted stroke swallow screen, pt has dementia at baseline and does not follow directions to drink all 3oz at once.  Daughter states that pt "drinks like a bird" and will not drink the full amount at once.  Pt took sip of water, no chocking or coughing noted.  MD aware.

## 2021-05-04 NOTE — ED Triage Notes (Signed)
PT to ER via EMS from [facility] with reports of chest congestion and cough for last several days.  Today she was eating breakfast and got chocked on the phlegm and vomited her breakfast.

## 2021-05-04 NOTE — ED Notes (Signed)
Multiple attempts to obtain Covid swab, pt is uncooperative and daughter now declines test.

## 2021-06-03 ENCOUNTER — Encounter (HOSPITAL_COMMUNITY): Payer: Self-pay | Admitting: Radiology

## 2021-10-31 ENCOUNTER — Other Ambulatory Visit: Payer: Self-pay

## 2021-10-31 ENCOUNTER — Emergency Department (HOSPITAL_COMMUNITY): Payer: Medicare PPO

## 2021-10-31 ENCOUNTER — Inpatient Hospital Stay (HOSPITAL_COMMUNITY): Payer: Medicare PPO

## 2021-10-31 ENCOUNTER — Inpatient Hospital Stay (HOSPITAL_COMMUNITY): Payer: Medicare PPO | Admitting: Anesthesiology

## 2021-10-31 ENCOUNTER — Inpatient Hospital Stay (HOSPITAL_COMMUNITY)
Admission: EM | Admit: 2021-10-31 | Discharge: 2021-11-06 | DRG: 481 | Disposition: A | Discharge status: Skilled Nursing Facility | Payer: Medicare PPO | Source: Skilled Nursing Facility | Attending: Infectious Diseases | Admitting: Infectious Diseases

## 2021-10-31 ENCOUNTER — Encounter (HOSPITAL_COMMUNITY): Payer: Self-pay | Admitting: Emergency Medicine

## 2021-10-31 ENCOUNTER — Encounter (HOSPITAL_COMMUNITY)
Admission: EM | Disposition: A | Discharge status: Skilled Nursing Facility | Payer: Self-pay | Source: Skilled Nursing Facility | Attending: Infectious Diseases

## 2021-10-31 DIAGNOSIS — E039 Hypothyroidism, unspecified: Secondary | ICD-10-CM | POA: Diagnosis present

## 2021-10-31 DIAGNOSIS — S62101A Fracture of unspecified carpal bone, right wrist, initial encounter for closed fracture: Secondary | ICD-10-CM | POA: Diagnosis present

## 2021-10-31 DIAGNOSIS — W19XXXA Unspecified fall, initial encounter: Secondary | ICD-10-CM | POA: Diagnosis present

## 2021-10-31 DIAGNOSIS — E876 Hypokalemia: Secondary | ICD-10-CM | POA: Diagnosis present

## 2021-10-31 DIAGNOSIS — S72009A Fracture of unspecified part of neck of unspecified femur, initial encounter for closed fracture: Secondary | ICD-10-CM | POA: Diagnosis present

## 2021-10-31 DIAGNOSIS — Z820 Family history of epilepsy and other diseases of the nervous system: Secondary | ICD-10-CM | POA: Diagnosis not present

## 2021-10-31 DIAGNOSIS — Y92129 Unspecified place in nursing home as the place of occurrence of the external cause: Secondary | ICD-10-CM

## 2021-10-31 DIAGNOSIS — I129 Hypertensive chronic kidney disease with stage 1 through stage 4 chronic kidney disease, or unspecified chronic kidney disease: Secondary | ICD-10-CM | POA: Diagnosis present

## 2021-10-31 DIAGNOSIS — D62 Acute posthemorrhagic anemia: Secondary | ICD-10-CM | POA: Diagnosis not present

## 2021-10-31 DIAGNOSIS — Z66 Do not resuscitate: Secondary | ICD-10-CM | POA: Diagnosis present

## 2021-10-31 DIAGNOSIS — F03911 Unspecified dementia, unspecified severity, with agitation: Secondary | ICD-10-CM

## 2021-10-31 DIAGNOSIS — S52591A Other fractures of lower end of right radius, initial encounter for closed fracture: Secondary | ICD-10-CM | POA: Diagnosis present

## 2021-10-31 DIAGNOSIS — M810 Age-related osteoporosis without current pathological fracture: Secondary | ICD-10-CM | POA: Diagnosis present

## 2021-10-31 DIAGNOSIS — I10 Essential (primary) hypertension: Secondary | ICD-10-CM | POA: Diagnosis not present

## 2021-10-31 DIAGNOSIS — E785 Hyperlipidemia, unspecified: Secondary | ICD-10-CM | POA: Diagnosis present

## 2021-10-31 DIAGNOSIS — M1990 Unspecified osteoarthritis, unspecified site: Secondary | ICD-10-CM

## 2021-10-31 DIAGNOSIS — Z886 Allergy status to analgesic agent status: Secondary | ICD-10-CM | POA: Diagnosis not present

## 2021-10-31 DIAGNOSIS — S72141A Displaced intertrochanteric fracture of right femur, initial encounter for closed fracture: Secondary | ICD-10-CM | POA: Diagnosis present

## 2021-10-31 DIAGNOSIS — G309 Alzheimer's disease, unspecified: Secondary | ICD-10-CM | POA: Diagnosis present

## 2021-10-31 DIAGNOSIS — S72141D Displaced intertrochanteric fracture of right femur, subsequent encounter for closed fracture with routine healing: Secondary | ICD-10-CM | POA: Diagnosis not present

## 2021-10-31 DIAGNOSIS — Z87891 Personal history of nicotine dependence: Secondary | ICD-10-CM

## 2021-10-31 DIAGNOSIS — R739 Hyperglycemia, unspecified: Secondary | ICD-10-CM | POA: Diagnosis present

## 2021-10-31 DIAGNOSIS — F0390 Unspecified dementia without behavioral disturbance: Secondary | ICD-10-CM | POA: Diagnosis not present

## 2021-10-31 DIAGNOSIS — K219 Gastro-esophageal reflux disease without esophagitis: Secondary | ICD-10-CM | POA: Diagnosis present

## 2021-10-31 DIAGNOSIS — Z79899 Other long term (current) drug therapy: Secondary | ICD-10-CM | POA: Diagnosis not present

## 2021-10-31 DIAGNOSIS — S62101D Fracture of unspecified carpal bone, right wrist, subsequent encounter for fracture with routine healing: Secondary | ICD-10-CM | POA: Diagnosis not present

## 2021-10-31 DIAGNOSIS — F32A Depression, unspecified: Secondary | ICD-10-CM | POA: Diagnosis present

## 2021-10-31 DIAGNOSIS — Z96642 Presence of left artificial hip joint: Secondary | ICD-10-CM | POA: Diagnosis present

## 2021-10-31 DIAGNOSIS — Z885 Allergy status to narcotic agent status: Secondary | ICD-10-CM

## 2021-10-31 DIAGNOSIS — F0280 Dementia in other diseases classified elsewhere without behavioral disturbance: Secondary | ICD-10-CM | POA: Diagnosis present

## 2021-10-31 DIAGNOSIS — Z8673 Personal history of transient ischemic attack (TIA), and cerebral infarction without residual deficits: Secondary | ICD-10-CM

## 2021-10-31 DIAGNOSIS — N1831 Chronic kidney disease, stage 3a: Secondary | ICD-10-CM | POA: Diagnosis present

## 2021-10-31 HISTORY — PX: INTRAMEDULLARY (IM) NAIL INTERTROCHANTERIC: SHX5875

## 2021-10-31 LAB — CBC WITH DIFFERENTIAL/PLATELET
Abs Immature Granulocytes: 0.08 10*3/uL — ABNORMAL HIGH (ref 0.00–0.07)
Basophils Absolute: 0.1 10*3/uL (ref 0.0–0.1)
Basophils Relative: 0 %
Eosinophils Absolute: 0 10*3/uL (ref 0.0–0.5)
Eosinophils Relative: 0 %
HCT: 38.8 % (ref 36.0–46.0)
Hemoglobin: 13.4 g/dL (ref 12.0–15.0)
Immature Granulocytes: 1 %
Lymphocytes Relative: 6 %
Lymphs Abs: 0.9 10*3/uL (ref 0.7–4.0)
MCH: 30.9 pg (ref 26.0–34.0)
MCHC: 34.5 g/dL (ref 30.0–36.0)
MCV: 89.6 fL (ref 80.0–100.0)
Monocytes Absolute: 0.8 10*3/uL (ref 0.1–1.0)
Monocytes Relative: 5 %
Neutro Abs: 13.6 10*3/uL — ABNORMAL HIGH (ref 1.7–7.7)
Neutrophils Relative %: 88 %
Platelets: 171 10*3/uL (ref 150–400)
RBC: 4.33 MIL/uL (ref 3.87–5.11)
RDW: 12.6 % (ref 11.5–15.5)
WBC: 15.5 10*3/uL — ABNORMAL HIGH (ref 4.0–10.5)
nRBC: 0 % (ref 0.0–0.2)

## 2021-10-31 LAB — BASIC METABOLIC PANEL
Anion gap: 11 (ref 5–15)
BUN: 13 mg/dL (ref 8–23)
CO2: 21 mmol/L — ABNORMAL LOW (ref 22–32)
Calcium: 8.3 mg/dL — ABNORMAL LOW (ref 8.9–10.3)
Chloride: 103 mmol/L (ref 98–111)
Creatinine, Ser: 1.14 mg/dL — ABNORMAL HIGH (ref 0.44–1.00)
GFR, Estimated: 47 mL/min — ABNORMAL LOW (ref >60)
Glucose, Bld: 162 mg/dL — ABNORMAL HIGH (ref 70–99)
Potassium: 3.3 mmol/L — ABNORMAL LOW (ref 3.5–5.1)
Sodium: 135 mmol/L (ref 135–145)

## 2021-10-31 LAB — SURGICAL PCR SCREEN
MRSA, PCR: POSITIVE — AB
Staphylococcus aureus: POSITIVE — AB

## 2021-10-31 LAB — CK: Total CK: 78 U/L (ref 38–234)

## 2021-10-31 SURGERY — FIXATION, FRACTURE, INTERTROCHANTERIC, WITH INTRAMEDULLARY ROD
Anesthesia: General | Laterality: Right

## 2021-10-31 MED ORDER — ACETAMINOPHEN 650 MG RE SUPP: 650.0000 mg | Freq: Four times a day (QID) | RECTAL | Status: DC

## 2021-10-31 MED ORDER — HYDROXYZINE HCL 25 MG PO TABS
25.0000 mg | ORAL_TABLET | Freq: Two times a day (BID) | ORAL | Status: DC
  Administered 2021-10-31 – 2021-11-02 (×5): 25 mg via ORAL
  Filled 2021-10-31 (×5): qty 1

## 2021-10-31 MED ORDER — ONDANSETRON HCL 4 MG/2ML IJ SOLN
INTRAMUSCULAR | Status: DC | PRN
Start: 2021-10-31 — End: 2021-10-31
  Administered 2021-10-31: 4 mg via INTRAVENOUS

## 2021-10-31 MED ORDER — LOPERAMIDE HCL 2 MG PO CAPS: 2.0000 mg | ORAL_CAPSULE | ORAL | Status: DC | PRN

## 2021-10-31 MED ORDER — SERTRALINE HCL 100 MG PO TABS
100.0000 mg | ORAL_TABLET | Freq: Every day | ORAL | Status: DC
  Administered 2021-11-01 – 2021-11-06 (×6): 100 mg via ORAL
  Filled 2021-10-31 (×6): qty 1

## 2021-10-31 MED ORDER — DEXAMETHASONE SODIUM PHOSPHATE 10 MG/ML IJ SOLN
INTRAMUSCULAR | Status: DC | PRN
  Administered 2021-10-31: 10 mg via INTRAVENOUS

## 2021-10-31 MED ORDER — ENOXAPARIN SODIUM 40 MG/0.4ML IJ SOSY
40.0000 mg | PREFILLED_SYRINGE | INTRAMUSCULAR | Status: DC
  Filled 2021-10-31 (×5): qty 0.4

## 2021-10-31 MED ORDER — VITAMIN D (ERGOCALCIFEROL) 1.25 MG (50000 UNIT) PO CAPS
50000.0000 [IU] | ORAL_CAPSULE | ORAL | Status: DC
  Filled 2021-10-31: qty 1

## 2021-10-31 MED ORDER — SUGAMMADEX SODIUM 200 MG/2ML IV SOLN
INTRAVENOUS | Status: DC | PRN
  Administered 2021-10-31: 200 mg via INTRAVENOUS

## 2021-10-31 MED ORDER — ONDANSETRON HCL 4 MG/2ML IJ SOLN: 4.0000 mg | Freq: Four times a day (QID) | INTRAMUSCULAR | Status: DC | PRN

## 2021-10-31 MED ORDER — HYDROMORPHONE HCL 1 MG/ML IJ SOLN
1.0000 mg | INTRAMUSCULAR | Status: DC | PRN
  Administered 2021-10-31 – 2021-11-05 (×6): 1 mg via INTRAVENOUS
  Filled 2021-10-31 (×6): qty 1

## 2021-10-31 MED ORDER — MENTHOL 3 MG MT LOZG: 1.0000 | LOZENGE | OROMUCOSAL | Status: DC | PRN

## 2021-10-31 MED ORDER — OXYCODONE HCL 5 MG PO TABS
5.0000 mg | ORAL_TABLET | ORAL | Status: DC | PRN
  Administered 2021-11-01: 5 mg via ORAL
  Administered 2021-11-02: 10 mg via ORAL
  Administered 2021-11-05: 5 mg via ORAL
  Administered 2021-11-06: 10 mg via ORAL
  Administered 2021-11-06: 5 mg via ORAL
  Filled 2021-10-31 (×2): qty 1
  Filled 2021-10-31 (×4): qty 2
  Filled 2021-10-31: qty 1

## 2021-10-31 MED ORDER — TRAZODONE HCL 50 MG PO TABS
75.0000 mg | ORAL_TABLET | Freq: Every day | ORAL | Status: DC
  Administered 2021-11-02 – 2021-11-05 (×5): 75 mg via ORAL
  Filled 2021-10-31 (×6): qty 2

## 2021-10-31 MED ORDER — SODIUM CHLORIDE 0.9 % IV SOLN
INTRAVENOUS | Status: DC | PRN
  Administered 2021-10-31: 2000 mg via TOPICAL

## 2021-10-31 MED ORDER — SORBITOL 70 % SOLN: 30.0000 mL | Freq: Every day | Status: DC | PRN

## 2021-10-31 MED ORDER — ALBUMIN HUMAN 5 % IV SOLN
INTRAVENOUS | Status: AC
  Filled 2021-10-31: qty 250

## 2021-10-31 MED ORDER — HYDROCODONE-ACETAMINOPHEN 5-325 MG PO TABS
1.0000 | ORAL_TABLET | Freq: Four times a day (QID) | ORAL | 0 refills | Status: DC | PRN
Start: 2021-10-31 — End: 2022-06-26

## 2021-10-31 MED ORDER — FENTANYL CITRATE (PF) 100 MCG/2ML IJ SOLN
25.0000 ug | Freq: Once | INTRAMUSCULAR | Status: AC
  Administered 2021-10-31: 25 ug via INTRAVENOUS

## 2021-10-31 MED ORDER — POLYETHYLENE GLYCOL 3350 17 G PO PACK: 17.0000 g | PACK | Freq: Every day | ORAL | Status: DC | PRN

## 2021-10-31 MED ORDER — LIDOCAINE 2% (20 MG/ML) 5 ML SYRINGE
INTRAMUSCULAR | Status: DC | PRN
  Administered 2021-10-31: 40 mg via INTRAVENOUS

## 2021-10-31 MED ORDER — OXYCODONE HCL 5 MG PO TABS
10.0000 mg | ORAL_TABLET | ORAL | Status: DC | PRN
  Administered 2021-11-01: 15 mg via ORAL
  Administered 2021-11-01: 10 mg via ORAL
  Administered 2021-11-02 – 2021-11-04 (×3): 15 mg via ORAL
  Filled 2021-10-31 (×4): qty 3

## 2021-10-31 MED ORDER — ALBUMIN HUMAN 5 % IV SOLN
12.5000 g | Freq: Once | INTRAVENOUS | Status: AC
  Administered 2021-10-31: 12.5 g via INTRAVENOUS

## 2021-10-31 MED ORDER — MUPIROCIN 2 % EX OINT
1.0000 "application " | TOPICAL_OINTMENT | Freq: Two times a day (BID) | CUTANEOUS | Status: AC
  Administered 2021-10-31 – 2021-11-05 (×9): 1 via NASAL
  Filled 2021-10-31 (×2): qty 22

## 2021-10-31 MED ORDER — ONDANSETRON HCL 4 MG/2ML IJ SOLN: 4.0000 mg | Freq: Once | INTRAMUSCULAR | Status: DC | PRN

## 2021-10-31 MED ORDER — METHOCARBAMOL 500 MG PO TABS
500.0000 mg | ORAL_TABLET | Freq: Four times a day (QID) | ORAL | Status: DC | PRN
Start: 2021-10-31 — End: 2021-11-06
  Administered 2021-10-31 – 2021-11-06 (×3): 500 mg via ORAL
  Filled 2021-10-31 (×3): qty 1

## 2021-10-31 MED ORDER — OXYCODONE HCL 5 MG PO TABS
5.0000 mg | ORAL_TABLET | ORAL | Status: AC
  Administered 2021-10-31: 5 mg via ORAL
  Filled 2021-10-31 (×2): qty 1

## 2021-10-31 MED ORDER — QUETIAPINE FUMARATE 25 MG PO TABS
12.5000 mg | ORAL_TABLET | Freq: Two times a day (BID) | ORAL | Status: DC
  Administered 2021-10-31 – 2021-11-06 (×12): 12.5 mg via ORAL
  Filled 2021-10-31 (×12): qty 1

## 2021-10-31 MED ORDER — LACTATED RINGERS IV SOLN
INTRAVENOUS | Status: DC
Start: 2021-10-31 — End: 2021-10-31

## 2021-10-31 MED ORDER — SENNOSIDES-DOCUSATE SODIUM 8.6-50 MG PO TABS
1.0000 | ORAL_TABLET | Freq: Every evening | ORAL | Status: DC
  Administered 2021-10-31 – 2021-11-05 (×6): 1 via ORAL
  Filled 2021-10-31 (×6): qty 1

## 2021-10-31 MED ORDER — CHLORHEXIDINE GLUCONATE 0.12 % MT SOLN
OROMUCOSAL | Status: AC
  Filled 2021-10-31: qty 15

## 2021-10-31 MED ORDER — FENTANYL CITRATE (PF) 250 MCG/5ML IJ SOLN
INTRAMUSCULAR | Status: AC
  Filled 2021-10-31: qty 5

## 2021-10-31 MED ORDER — CHLORHEXIDINE GLUCONATE 4 % EX LIQD: 60.0000 mL | Freq: Once | CUTANEOUS | Status: DC

## 2021-10-31 MED ORDER — LIDOCAINE 2% (20 MG/ML) 5 ML SYRINGE
INTRAMUSCULAR | Status: AC
  Filled 2021-10-31: qty 5

## 2021-10-31 MED ORDER — ACETAMINOPHEN 10 MG/ML IV SOLN
INTRAVENOUS | Status: DC | PRN
  Administered 2021-10-31: 1000 mg via INTRAVENOUS

## 2021-10-31 MED ORDER — CEFAZOLIN SODIUM-DEXTROSE 2-4 GM/100ML-% IV SOLN
2.0000 g | Freq: Three times a day (TID) | INTRAVENOUS | Status: AC
  Administered 2021-10-31 – 2021-11-01 (×2): 2 g via INTRAVENOUS
  Filled 2021-10-31 (×2): qty 100

## 2021-10-31 MED ORDER — PHENYLEPHRINE 80 MCG/ML (10ML) SYRINGE FOR IV PUSH (FOR BLOOD PRESSURE SUPPORT)
PREFILLED_SYRINGE | INTRAVENOUS | Status: AC
  Filled 2021-10-31: qty 10

## 2021-10-31 MED ORDER — ALUM & MAG HYDROXIDE-SIMETH 200-200-20 MG/5ML PO SUSP: 30.0000 mL | ORAL | Status: DC | PRN

## 2021-10-31 MED ORDER — ROCURONIUM BROMIDE 10 MG/ML (PF) SYRINGE
PREFILLED_SYRINGE | INTRAVENOUS | Status: AC
  Filled 2021-10-31: qty 10

## 2021-10-31 MED ORDER — POVIDONE-IODINE 10 % EX SWAB
2.0000 "application " | Freq: Once | CUTANEOUS | Status: AC
  Administered 2021-10-31: 2 via TOPICAL

## 2021-10-31 MED ORDER — FENTANYL CITRATE (PF) 100 MCG/2ML IJ SOLN: 25.0000 ug | INTRAMUSCULAR | Status: DC | PRN

## 2021-10-31 MED ORDER — PHENYLEPHRINE 80 MCG/ML (10ML) SYRINGE FOR IV PUSH (FOR BLOOD PRESSURE SUPPORT)
PREFILLED_SYRINGE | INTRAVENOUS | Status: DC | PRN
  Administered 2021-10-31 (×3): 160 ug via INTRAVENOUS

## 2021-10-31 MED ORDER — HYDROMORPHONE HCL 1 MG/ML IJ SOLN
0.5000 mg | Freq: Once | INTRAMUSCULAR | Status: AC
  Administered 2021-10-31: 0.5 mg via INTRAVENOUS
  Filled 2021-10-31: qty 1

## 2021-10-31 MED ORDER — ROCURONIUM BROMIDE 10 MG/ML (PF) SYRINGE
PREFILLED_SYRINGE | INTRAVENOUS | Status: DC | PRN
  Administered 2021-10-31: 50 mg via INTRAVENOUS

## 2021-10-31 MED ORDER — METHOCARBAMOL 1000 MG/10ML IJ SOLN
500.0000 mg | Freq: Four times a day (QID) | INTRAVENOUS | Status: DC | PRN
  Filled 2021-10-31: qty 5

## 2021-10-31 MED ORDER — ENSURE ENLIVE PO LIQD
237.0000 mL | Freq: Three times a day (TID) | ORAL | Status: DC
  Administered 2021-10-31 – 2021-11-06 (×17): 237 mL via ORAL

## 2021-10-31 MED ORDER — PHENOL 1.4 % MT LIQD: 1.0000 | OROMUCOSAL | Status: DC | PRN

## 2021-10-31 MED ORDER — ACETAMINOPHEN 500 MG PO TABS
1000.0000 mg | ORAL_TABLET | Freq: Four times a day (QID) | ORAL | Status: AC
  Administered 2021-10-31: 1000 mg via ORAL
  Filled 2021-10-31: qty 2

## 2021-10-31 MED ORDER — ONDANSETRON HCL 4 MG PO TABS: 4.0000 mg | ORAL_TABLET | Freq: Four times a day (QID) | ORAL | Status: DC | PRN

## 2021-10-31 MED ORDER — FENTANYL CITRATE (PF) 250 MCG/5ML IJ SOLN
INTRAMUSCULAR | Status: DC | PRN
  Administered 2021-10-31 (×2): 25 ug via INTRAVENOUS

## 2021-10-31 MED ORDER — ONDANSETRON HCL 4 MG/2ML IJ SOLN
INTRAMUSCULAR | Status: AC
  Filled 2021-10-31: qty 2

## 2021-10-31 MED ORDER — ENOXAPARIN SODIUM 40 MG/0.4ML IJ SOSY: 40.0000 mg | PREFILLED_SYRINGE | Freq: Every day | INTRAMUSCULAR | 0 refills | Status: AC

## 2021-10-31 MED ORDER — FENTANYL CITRATE PF 50 MCG/ML IJ SOSY
PREFILLED_SYRINGE | INTRAMUSCULAR | Status: AC
  Administered 2021-10-31: 50 ug
  Filled 2021-10-31: qty 1

## 2021-10-31 MED ORDER — ACETAMINOPHEN 500 MG PO TABS
1000.0000 mg | ORAL_TABLET | Freq: Four times a day (QID) | ORAL | Status: DC
  Administered 2021-10-31 – 2021-11-01 (×2): 1000 mg via ORAL
  Filled 2021-10-31 (×3): qty 2

## 2021-10-31 MED ORDER — TRANEXAMIC ACID 1000 MG/10ML IV SOLN
2000.0000 mg | Freq: Once | INTRAVENOUS | Status: DC
  Filled 2021-10-31 (×2): qty 20

## 2021-10-31 MED ORDER — PROPOFOL 10 MG/ML IV BOLUS
INTRAVENOUS | Status: DC | PRN
  Administered 2021-10-31: 70 mg via INTRAVENOUS

## 2021-10-31 MED ORDER — HYDROMORPHONE HCL 1 MG/ML IJ SOLN
0.5000 mg | INTRAMUSCULAR | Status: DC | PRN
  Administered 2021-11-01: 1 mg via INTRAVENOUS
  Administered 2021-11-01 – 2021-11-06 (×2): 0.5 mg via INTRAVENOUS
  Filled 2021-10-31 (×4): qty 1

## 2021-10-31 MED ORDER — DOCUSATE SODIUM 100 MG PO CAPS
100.0000 mg | ORAL_CAPSULE | Freq: Two times a day (BID) | ORAL | Status: DC
  Administered 2021-10-31: 100 mg via ORAL
  Filled 2021-10-31 (×6): qty 1

## 2021-10-31 MED ORDER — ENOXAPARIN SODIUM 40 MG/0.4ML IJ SOSY: 40.0000 mg | PREFILLED_SYRINGE | INTRAMUSCULAR | Status: DC

## 2021-10-31 MED ORDER — ACETAMINOPHEN 325 MG PO TABS
325.0000 mg | ORAL_TABLET | Freq: Four times a day (QID) | ORAL | Status: DC | PRN
  Administered 2021-11-01: 500 mg via ORAL

## 2021-10-31 MED ORDER — CEFAZOLIN SODIUM-DEXTROSE 2-4 GM/100ML-% IV SOLN
2.0000 g | INTRAVENOUS | Status: AC
  Administered 2021-10-31: 2 g via INTRAVENOUS
  Filled 2021-10-31: qty 100

## 2021-10-31 MED ORDER — FENTANYL CITRATE (PF) 100 MCG/2ML IJ SOLN
INTRAMUSCULAR | Status: AC
  Filled 2021-10-31: qty 2

## 2021-10-31 MED ORDER — TRANEXAMIC ACID-NACL 1000-0.7 MG/100ML-% IV SOLN
1000.0000 mg | Freq: Once | INTRAVENOUS | Status: AC
  Administered 2021-10-31: 1000 mg via INTRAVENOUS
  Filled 2021-10-31: qty 100

## 2021-10-31 MED ORDER — SODIUM CHLORIDE 0.9 % IV SOLN: INTRAVENOUS | Status: DC

## 2021-10-31 MED ORDER — ACETAMINOPHEN 10 MG/ML IV SOLN
INTRAVENOUS | Status: AC
  Filled 2021-10-31: qty 100

## 2021-10-31 MED ORDER — DEXAMETHASONE SODIUM PHOSPHATE 10 MG/ML IJ SOLN
INTRAMUSCULAR | Status: AC
  Filled 2021-10-31: qty 1

## 2021-10-31 MED ORDER — CHLORHEXIDINE GLUCONATE CLOTH 2 % EX PADS
6.0000 | MEDICATED_PAD | Freq: Every day | CUTANEOUS | Status: AC
  Administered 2021-11-03 – 2021-11-05 (×3): 6 via TOPICAL

## 2021-10-31 MED ORDER — PROPOFOL 10 MG/ML IV BOLUS
INTRAVENOUS | Status: AC
  Filled 2021-10-31: qty 20

## 2021-10-31 SURGICAL SUPPLY — 50 items
BAG COUNTER SPONGE SURGICOUNT (BAG) ×3 IMPLANT
BAG SPNG CNTER NS LX DISP (BAG) ×1
BIT DRILL INTERTAN LAG SCREW (BIT) ×1 IMPLANT
BIT DRILL SHORT 4.0 (BIT) IMPLANT
BNDG COHESIVE 4X5 TAN STRL (GAUZE/BANDAGES/DRESSINGS) ×3 IMPLANT
BNDG COHESIVE 6X5 TAN STRL LF (GAUZE/BANDAGES/DRESSINGS) IMPLANT
BNDG GAUZE ELAST 4 BULKY (GAUZE/BANDAGES/DRESSINGS) ×3 IMPLANT
COVER PERINEAL POST (MISCELLANEOUS) ×3 IMPLANT
COVER SURGICAL LIGHT HANDLE (MISCELLANEOUS) ×3 IMPLANT
DRAPE C-ARMOR (DRAPES) ×3 IMPLANT
DRAPE STERI IOBAN 125X83 (DRAPES) ×3 IMPLANT
DRESSING MEPILEX FLEX 4X4 (GAUZE/BANDAGES/DRESSINGS) IMPLANT
DRILL BIT SHORT 4.0 (BIT) ×2
DRSG MEPILEX BORDER 4X4 (GAUZE/BANDAGES/DRESSINGS) ×3 IMPLANT
DRSG MEPILEX BORDER 4X8 (GAUZE/BANDAGES/DRESSINGS) ×3 IMPLANT
DRSG MEPILEX FLEX 4X4 (GAUZE/BANDAGES/DRESSINGS) ×8
DRSG PAD ABDOMINAL 8X10 ST (GAUZE/BANDAGES/DRESSINGS) ×6 IMPLANT
DURAPREP 26ML APPLICATOR (WOUND CARE) ×3 IMPLANT
ELECT REM PT RETURN 9FT ADLT (ELECTROSURGICAL) ×2
ELECTRODE REM PT RTRN 9FT ADLT (ELECTROSURGICAL) ×2 IMPLANT
GLOVE BIOGEL PI IND STRL 7.0 (GLOVE) ×2 IMPLANT
GLOVE BIOGEL PI INDICATOR 7.0 (GLOVE) ×1
GLOVE ECLIPSE 7.0 STRL STRAW (GLOVE) ×3 IMPLANT
GLOVE SKINSENSE NS SZ7.5 (GLOVE) ×2
GLOVE SKINSENSE STRL SZ7.5 (GLOVE) ×4 IMPLANT
GLOVE SURG UNDER POLY LF SZ7 (GLOVE) ×57 IMPLANT
GLOVE SURG UNDER POLY LF SZ7.5 (GLOVE) ×12 IMPLANT
GOWN STRL REIN XL XLG (GOWN DISPOSABLE) ×3 IMPLANT
GUIDE PIN 3.2X343 (PIN) ×2
GUIDE PIN 3.2X343MM (PIN) ×4
KIT BASIN OR (CUSTOM PROCEDURE TRAY) ×3 IMPLANT
KIT TURNOVER KIT B (KITS) ×3 IMPLANT
MANIFOLD NEPTUNE II (INSTRUMENTS) ×3 IMPLANT
NAIL TRIGEN 10MMX36CM-125 RT (Nail) ×1 IMPLANT
NS IRRIG 1000ML POUR BTL (IV SOLUTION) ×3 IMPLANT
PACK GENERAL/GYN (CUSTOM PROCEDURE TRAY) ×3 IMPLANT
PAD ARMBOARD 7.5X6 YLW CONV (MISCELLANEOUS) ×6 IMPLANT
PAD CAST 4YDX4 CTTN HI CHSV (CAST SUPPLIES) ×4 IMPLANT
PADDING CAST COTTON 4X4 STRL (CAST SUPPLIES) ×4
PIN GUIDE 3.2X343MM (PIN) IMPLANT
SCREW LAG COMPR KIT 85/80 (Screw) ×1 IMPLANT
SCREW TRIGEN LOW PROF 5.0X37.5 (Screw) ×1 IMPLANT
STAPLER VISISTAT 35W (STAPLE) ×3 IMPLANT
SUT VIC AB 0 CT1 27 (SUTURE) ×2
SUT VIC AB 0 CT1 27XBRD ANBCTR (SUTURE) ×2 IMPLANT
SUT VIC AB 2-0 CT1 27 (SUTURE) ×2
SUT VIC AB 2-0 CT1 TAPERPNT 27 (SUTURE) ×2 IMPLANT
TOWEL GREEN STERILE (TOWEL DISPOSABLE) ×3 IMPLANT
TOWEL GREEN STERILE FF (TOWEL DISPOSABLE) ×3 IMPLANT
WATER STERILE IRR 1000ML POUR (IV SOLUTION) ×3 IMPLANT

## 2021-10-31 NOTE — ED Triage Notes (Signed)
Patient BIB [facility] from [facility] c/o unwitnessed fall.  Patient has shortening and rotation of right hip and is c/o pain to that hip.  Patient also has hematoma to head.  Patient has dementia and staff reports patient "is not at baseline."

## 2021-10-31 NOTE — ED Notes (Signed)
Patient in pain. Placed on purewick and attempted peri-care. Patient screaming at this time

## 2021-10-31 NOTE — ED Notes (Signed)
Pt has 1+ right pedal pulse and 2+ left pedal pulse, cap refill less than 3 sec bilat, feet warm to touch, pt able to move feet.

## 2021-10-31 NOTE — Op Note (Signed)
   Date of Surgery: 10/31/2021  INDICATIONS: Ms. Waguespack is a 88 y.o.-year-old female who sustained a right hip fracture. The risks and benefits of the procedure discussed with the patient prior to the procedure and all questions were answered; consent was obtained.  PREOPERATIVE DIAGNOSIS: right intertrochanteric hip fracture   POSTOPERATIVE DIAGNOSIS: Same   PROCEDURE: Open treatment of intertrochanteric fracture with intramedullary implant. CPT [phone]   SURGEON: N. Glee Arvin, M.D.   ASSIST: Oneal Grout, [place]  ANESTHESIA: general   IV FLUIDS AND URINE: See anesthesia record   ESTIMATED BLOOD LOSS: 150 cc  IMPLANTS: [employer] InterTAN 10 x 36, 85/80  DRAINS: None.   COMPLICATIONS: see description of procedure.   DESCRIPTION OF PROCEDURE: The patient was brought to the operating room and placed supine on the operating table. The patient's leg had been signed prior to the procedure. The patient had the anesthesia placed by the anesthesiologist. The prep verification and incision time-outs were performed to confirm that this was the correct patient, site, side and location. The patient had an SCD on the opposite lower extremity. The patient did receive antibiotics prior to the incision and was re-dosed during the procedure as needed at indicated intervals. The patient was positioned on the fracture table with the table in traction and internal rotation to reduce the hip. The well leg was placed in a scissor position and all bony prominences were well-padded. The patient had the lower extremity prepped and draped in the standard surgical fashion. The incision was made 4 finger breadths superior to the greater trochanter. A guide pin was inserted into the tip of the greater trochanter under fluoroscopic guidance. An opening reamer was used to gain access to the femoral canal. The nail length was measured and inserted down the femoral canal to its proper depth. The appropriate  version of insertion for the lag screw was found under fluoroscopy. A pin was inserted up the femoral neck through the jig. Then, a second antirotation pin was inserted inferior to the first pin. The length of the lag screw was then measured. The lag screw was inserted as near to center-center in the head as possible. The antirotation pin was then taken out and an interdigitating compression screw was placed in its place. The leg was taken out of traction, then the interdigitating compression screw was used to compress across the fracture. Compression was visualized on serial xrays.  A distal interlocking screw was placed using the perfect circle technique.  The wound was copiously irrigated with saline and the subcutaneous layer closed with 2.0 vicryl and the skin was reapproximated with staples. The wounds were cleaned and dried a final time and a sterile dressing was placed. The hip was taken through a range of motion at the end of the case under fluoroscopic imaging to visualize the approach-withdraw phenomenon and confirm implant length in the head. The patient was then awakened from anesthesia and taken to the recovery room in stable condition. All counts were correct at the end of the case.   Tessa Lerner was necessary for opening, closing, retracting, limb positioning and overall facilitation and completion of the surgery.  POSTOPERATIVE PLAN: The patient will be weight bearing as tolerated and will return in 2 weeks for staple removal and the patient will receive DVT prophylaxis based on other medications, activity level, and risk ratio of bleeding to thrombosis.   [name], MD [facility] 1:58 PM

## 2021-10-31 NOTE — ED Provider Notes (Signed)
Patient initially seen by Dr. Oletta Cohn.  Patient presented to the ED for evaluation after a fall. Physical Exam  BP (!) 148/77   Pulse 87   SpO2 97%   Physical Exam deferrred Procedures  Procedures  ED Course / MDM   Clinical Course as of 10/31/21 0750  Thu Oct 31, 2021  0748 DG HIP UNILAT WITH PELVIS 2-3 VIEWS RIGHT X-ray images and radiology report reviewed.  Patient has a right intertrochanteric hip fracture.  No acute injury noted on chest x-ray head CT or C-spine CT [JK]    Clinical Course User Index [JK] Linwood Dibbles, MD   Medical Decision Making Problems Addressed: Closed displaced intertrochanteric fracture of right femur, initial encounter [facility]): acute illness or injury that poses a threat to life or bodily functions Dementia with agitation, unspecified dementia severity, unspecified dementia type [facility]): chronic illness or injury Fall, initial encounter: acute illness or injury  Amount and/or Complexity of Data Reviewed Labs: ordered. Radiology: ordered. Decision-making details documented in ED Course.  Risk Prescription drug management. Decision regarding hospitalization.   Patient with an intertrochanteric hip fracture.  Patient also with advanced dementia.  Findings and plan discussed with the patient's daughter who is at the bedside.  Additional pain medications ordered for comfort.  We will continue to monitor closely.  Case discussed with the internal medicine service and orthopedics regarding admission and further treatment       Linwood Dibbles, MD 10/31/21 [phone]

## 2021-10-31 NOTE — Anesthesia Postprocedure Evaluation (Signed)
Anesthesia Post Note  Patient: Heather Floyd  Procedure(s) Performed: RIGHT INTRAMEDULLARY (IM) NAIL INTERTROCHANTRIC (Right)     Patient location during evaluation: PACU Anesthesia Type: General Level of consciousness: awake and alert, oriented and patient cooperative Pain management: pain level controlled Vital Signs Assessment: post-procedure vital signs reviewed and stable Respiratory status: spontaneous breathing, nonlabored ventilation and respiratory function stable Cardiovascular status: blood pressure returned to baseline and stable Postop Assessment: no apparent nausea or vomiting Anesthetic complications: no   No notable events documented.  Last Vitals:  Vitals:   10/31/21 1125 10/31/21 1430  BP: (!) 144/78 (!) 114/98  Pulse: 80 (!) 101  Resp: 16 16  Temp: 36.9 C 36.5 C  SpO2: 100% 92%    Last Pain:  Vitals:   10/31/21 1430  TempSrc:   PainSc: 0-No pain                 Lannie Krygier

## 2021-10-31 NOTE — ED Notes (Signed)
Per pt's daughter, all of pt's pill have to be crushed and mixed with chocolate ensure. That is the only way pt will take pills. The daughter said she will not take it in applesauce or anything else and that she will spit it back out at you if you try to give meds in applesauce or anything else.

## 2021-10-31 NOTE — ED Provider Notes (Signed)
[facility] EMERGENCY DEPARTMENT Provider Note   CSN: [id number] Arrival date & time:        History  Chief Complaint  Patient presents with   Heather Floyd is a 88 y.o. female.  Patient presents to the emergency department after a fall.  Patient had an unwitnessed fall this morning at skilled nursing facility.  She does have a history of dementia, cannot provide any further information.  Patient complaining of severe right hip and thigh pain at arrival.      Home Medications Prior to Admission medications   Medication Sig Start Date End Date Taking? Authorizing Provider  acetaminophen (TYLENOL) 325 MG tablet Take 2 tablets (650 mg total) by mouth every 6 (six) hours as needed for mild pain or headache. Tablet should be crushed and provided in food 09/04/20   Gerhard Munch, MD  amLODipine (NORVASC) 5 MG tablet Take 5 mg by mouth at bedtime.  02/01/18   [provider]  feeding supplement, ENSURE ENLIVE, (ENSURE ENLIVE) LIQD Take 237 mLs by mouth 3 (three) times daily between meals. Patient taking differently: Take 237 mLs by mouth 3 (three) times daily as needed (feeding supplement). 07/29/18   Elgergawy, Leana Roe, MD  gabapentin (NEURONTIN) 100 MG capsule Take 100 mg by mouth 3 (three) times daily. 03/30/18   [provider]  hydrOXYzine (ATARAX/VISTARIL) 25 MG tablet Take 1 tablet (25 mg total) by mouth 3 (three) times daily as needed. Patient taking differently: Take 25 mg by mouth 2 (two) times daily. 07/02/18   Butch Penny, NP  loperamide (IMODIUM A-D) 2 MG tablet Take 2 mg by mouth every 4 (four) hours as needed for diarrhea or loose stools.    [provider]  polyethylene glycol (MIRALAX) packet Take 17 g by mouth daily as needed for moderate constipation. Patient taking differently: Take 17 g by mouth daily. 07/29/18   Elgergawy, Leana Roe, MD  QUEtiapine (SEROQUEL) 25 MG tablet Take 12.5 mg by mouth 2 (two) times  daily. 12/25/18   [provider]  senna-docusate (SENOKOT-S) 8.6-50 MG tablet Take 3 tablets by mouth 2 (two) times daily. Patient taking differently: Take 1 tablet by mouth every evening. 07/29/18   Elgergawy, Leana Roe, MD  sertraline (ZOLOFT) 100 MG tablet Take 1 tablet (100 mg total) by mouth daily. 09/21/17   Butch Penny, NP  terbinafine (LAMISIL) 1 % cream Apply 1 application topically See admin instructions. Apply to big toe twice daily for rash    [provider]  traMADol (ULTRAM) 50 MG tablet Take 1 tablet (50 mg total) by mouth at bedtime. Patient taking differently: Take 25 mg by mouth at bedtime. 01/22/19   Charlynne Pander, MD  traZODone (DESYREL) 50 MG tablet Take 1.5 tablets (75 mg total) by mouth at bedtime. 07/02/18   Butch Penny, NP  Vitamin D, Ergocalciferol, (DRISDOL) 1.25 MG (50000 UNIT) CAPS capsule Take 50,000 Units by mouth every Tuesday.    [provider]      Allergies    Aspirin and Codeine    Review of Systems   Review of Systems  Physical Exam Updated Vital Signs BP (!) 148/77  Physical Exam Vitals and nursing note reviewed.  Constitutional:      General: She is not in acute distress.    Appearance: She is well-developed.  HENT:     Head: Normocephalic and atraumatic.     Mouth/Throat:     Mouth: Mucous membranes are  moist.  Eyes:     General: Vision grossly intact. Gaze aligned appropriately.     Extraocular Movements: Extraocular movements intact.     Conjunctiva/sclera: Conjunctivae normal.  Cardiovascular:     Rate and Rhythm: Normal rate and regular rhythm.     Pulses: Normal pulses.     Heart sounds: Normal heart sounds, S1 normal and S2 normal. No murmur heard.   No friction rub. No gallop.  Pulmonary:     Effort: Pulmonary effort is normal. No respiratory distress.     Breath sounds: Normal breath sounds.  Abdominal:     General: Bowel sounds are normal.     Palpations: Abdomen is soft.     Tenderness:  There is no abdominal tenderness. There is no guarding or rebound.     Hernia: No hernia is present.  Musculoskeletal:        General: No swelling.     Cervical back: Full passive range of motion without pain, normal range of motion and neck supple. No spinous process tenderness or muscular tenderness. Normal range of motion.     Right hip: Deformity and tenderness present. Decreased range of motion.     Right lower leg: No edema.     Left lower leg: No edema.  Skin:    General: Skin is warm and dry.     Capillary Refill: Capillary refill takes less than 2 seconds.     Findings: No ecchymosis, erythema, rash or wound.  Neurological:     General: No focal deficit present.     Mental Status: She is alert and oriented to person, place, and time.     GCS: GCS eye subscore is 4. GCS verbal subscore is 5. GCS motor subscore is 6.     Cranial Nerves: Cranial nerves 2-12 are intact.     Sensory: Sensation is intact.     Motor: Motor function is intact.     Coordination: Coordination is intact.  Psychiatric:        Attention and Perception: Attention normal.        Mood and Affect: Mood normal.        Speech: Speech normal.        Behavior: Behavior normal.    ED Results / Procedures / Treatments   Labs (all labs ordered are listed, but only abnormal results are displayed) Labs Reviewed  CBC WITH DIFFERENTIAL/PLATELET  BASIC METABOLIC PANEL    EKG None  Radiology No results found.  Procedures Procedures    Medications Ordered in ED Medications  fentaNYL (SUBLIMAZE) 50 MCG/ML injection (50 mcg  Given 10/31/21 1941)    ED Course/ Medical Decision Making/ A&P                           Medical Decision Making Amount and/or Complexity of Data Reviewed Independent Historian:     Details: Daughter Labs: ordered. Decision-making details documented in ED Course. Radiology: ordered and independent interpretation performed. Decision-making details documented in ED  Course. ECG/medicine tests: ordered and independent interpretation performed.   Patient presents to the emergency department for evaluation of leg pain after a fall.  Right hip fracture is considered likely based on her exam.  We will need to rule out intracranial and cervical spine injury as well.  Patient alert, somewhat agitated secondary to pain.  She is confused.  Daughter present at bedside reports that this is her baseline.  Will sign out to oncoming ER physician  to follow up on workup.        Final Clinical Impression(s) / ED Diagnoses Final diagnoses:  Fall, initial encounter    Rx / DC Orders ED Discharge Orders     None         Jayton Popelka, Canary Brimhristopher J, MD 10/31/21 [phone]

## 2021-10-31 NOTE — Anesthesia Preprocedure Evaluation (Signed)
Anesthesia Evaluation  Patient identified by MRN, date of birth, ID band Patient confused    Reviewed: Allergy & Precautions, NPO status , Patient's Chart, lab work & pertinent test results  Airway Mallampati: III  TM Distance: >3 FB Neck ROM: Full    Dental  (+) Edentulous Upper, Edentulous Lower   Pulmonary former smoker,    Pulmonary exam normal breath sounds clear to auscultation       Cardiovascular hypertension, Pt. on medications  Rhythm:Regular Rate:Tachycardia  Echo 2014 Left ventricle: The cavity size was normal. Wall thickness  was increased in a pattern of mild LVH. Systolic function  was vigorous. The estimated ejection fraction was in the  range of 65% to 70%. Wall motion was normal; there were no  regional wall motion abnormalities. Doppler parameters are  consistent with abnormal left ventricular relaxation (grade  1 diastolic dysfunction).       Neuro/Psych PSYCHIATRIC DISORDERS Dementia Severe dementia negative neurological ROS     GI/Hepatic negative GI ROS, Neg liver ROS,   Endo/Other  negative endocrine ROS  Renal/GU negative Renal ROS  negative genitourinary   Musculoskeletal  (+) Arthritis , Osteoarthritis,    Abdominal   Peds negative pediatric ROS (+)  Hematology negative hematology ROS (+)   Anesthesia Other Findings   Reproductive/Obstetrics negative OB ROS                             Anesthesia Physical Anesthesia Plan  ASA: 3  Anesthesia Plan: General   Post-op Pain Management: Ofirmev IV (intra-op)*   Induction: Intravenous  PONV Risk Score and Plan: 3 and Ondansetron, Dexamethasone and Treatment may vary due to age or medical condition  Airway Management Planned: Oral ETT  Additional Equipment: None  Intra-op Plan:   Post-operative Plan: Extubation in OR  Informed Consent: I have reviewed the patients History and Physical, chart, labs  and discussed the procedure including the risks, benefits and alternatives for the proposed anesthesia with the patient or authorized representative who has indicated his/her understanding and acceptance.   Patient has DNR.  Discussed DNR with power of attorney, Suspend DNR and Continue DNR.   Dental advisory given and Consent reviewed with POA  Plan Discussed with: CRNA  Anesthesia Plan Comments: (Reviewed w/ daughters at bedside- would like to withhold chest compressions and defibrillation but otherwise rescind DNR)        Anesthesia Quick Evaluation

## 2021-10-31 NOTE — Consult Note (Signed)
Reason for Consult:Right hip fx Referring Physician: Linwood Dibbles Time called: [id number] Time at bedside: 0929   Heather Floyd is an 88 y.o. female.  HPI: Heather Floyd fell at the facility where she resides. She was brought to the ED because of possible head trauma but was noted to be holding her leg oddly and x-rays confirmed a hip fx and orthopedic surgery was consulted. History was provided by daughter as pt is severely demented. She ambulates without any assistive devices.  Past Medical History:  Diagnosis Date   Abnormality of gait 09/08/2014   Carotid artery occlusion    Concussion with loss of consciousness 09/08/2014   Dementia (HCC)    DJD (degenerative joint disease) of cervical spine    Hyperlipidemia    Hypertension    Memory disturbance 03/29/2013   Osteoporosis    Personal history of colonic polyps    RLS (restless legs syndrome)    Sleep disorder    TIA (transient ischemic attack)    Varicose veins     Past Surgical History:  Procedure Laterality Date   ANTERIOR AND POSTERIOR VAGINAL REPAIR W/ SACROSPINOUS LIGAMENT SUSPENSION     CESAREAN SECTION     ENDOVENOUS ABLATION SAPHENOUS VEIN W/ LASER     HEMORRHOID SURGERY     JOINT REPLACEMENT     ORIF ELBOW FRACTURE Left 07/26/2018   Procedure: OPEN REDUCTION INTERNAL FIXATION (ORIF) ELBOW/OLECRANON FRACTURE;  Surgeon: Tarry Kos, MD;  Location: [facility] OR;  Service: Orthopedics;  Laterality: Left;   REDUCTION MAMMAPLASTY     TOTAL HIP ARTHROPLASTY Left 10/19/2017   Procedure: ANTERIOR HEMI HIP;  Surgeon: Tarry Kos, MD;  Location: [facility] OR;  Service: Orthopedics;  Laterality: Left;    Family History  Problem Relation Age of Onset   Mental illness Mother    Alzheimer's disease Mother    Cancer Father    Bone cancer Father    Dementia Sister     Social History:  reports that she quit smoking about 41 years ago. She has never used smokeless tobacco. She reports that she does not drink alcohol and does not use drugs.  Allergies:   Allergies  Allergen Reactions   Aspirin Other (See Comments)    Makes blood too thin   Codeine Nausea Only    Medications: I have reviewed the patient's current medications.  Results for orders placed or performed during the hospital encounter of 10/31/21 (from the past 48 hour(s))  CBC with Differential/Platelet     Status: Abnormal   Collection Time: 10/31/21  6:53 AM  Result Value Ref Range   WBC 15.5 (H) 4.0 - 10.5 K/uL   RBC 4.33 3.87 - 5.11 MIL/uL   Hemoglobin 13.4 12.0 - 15.0 g/dL   HCT 84.6 96.2 - 95.2 %   MCV 89.6 80.0 - 100.0 fL   MCH 30.9 26.0 - 34.0 pg   MCHC 34.5 30.0 - 36.0 g/dL   RDW 84.1 32.4 - 40.1 %   Platelets 171 150 - 400 K/uL   nRBC 0.0 0.0 - 0.2 %   Neutrophils Relative % 88 %   Neutro Abs 13.6 (H) 1.7 - 7.7 K/uL   Lymphocytes Relative 6 %   Lymphs Abs 0.9 0.7 - 4.0 K/uL   Monocytes Relative 5 %   Monocytes Absolute 0.8 0.1 - 1.0 K/uL   Eosinophils Relative 0 %   Eosinophils Absolute 0.0 0.0 - 0.5 K/uL   Basophils Relative 0 %   Basophils Absolute 0.1 0.0 -  0.1 K/uL   Immature Granulocytes 1 %   Abs Immature Granulocytes 0.08 (H) 0.00 - 0.07 K/uL    Comment: Performed at [facility], [address], [place], [place] [address]  Basic metabolic panel     Status: Abnormal   Collection Time: 10/31/21  6:53 AM  Result Value Ref Range   Sodium 135 135 - 145 mmol/L   Potassium 3.3 (L) 3.5 - 5.1 mmol/L   Chloride 103 98 - 111 mmol/L   CO2 21 (L) 22 - 32 mmol/L   Glucose, Bld 162 (H) 70 - 99 mg/dL    Comment: Glucose reference range applies only to samples taken after fasting for at least 8 hours.   BUN 13 8 - 23 mg/dL   Creatinine, Ser 4.23 (H) 0.44 - 1.00 mg/dL   Calcium 8.3 (L) 8.9 - 10.3 mg/dL   GFR, Estimated 47 (L) >60 mL/min    Comment: (NOTE) Calculated using the CKD-EPI Creatinine Equation (2021)    Anion gap 11 5 - 15    Comment: Performed at [facility], [address], [place], [place] [address]    DG Chest 1  View  Result Date: 10/31/2021 CLINICAL DATA:  88 year old female with history of injury with right hip pain after a fall. EXAM: CHEST  1 VIEW COMPARISON:  Chest x-ray 05/04/2021. FINDINGS: Lung volumes are low. Ill-defined opacities at the left lung base, similar to the prior study, which may reflect areas of subsegmental atelectasis or chronic scarring. Blunting of the left costophrenic sulcus may suggest chronic pleuroparenchymal scarring or a trace left pleural effusion. No right pleural effusion. No pneumothorax. No evidence of pulmonary edema. Heart size is normal. Upper mediastinal contours are within normal limits. Aortic atherosclerosis. IMPRESSION: 1. Opacities at the left lung base favored to reflect areas of chronic scarring. 2. Aortic atherosclerosis. Electronically Signed   By: Trudie Reed M.D.   On: 10/31/2021 07:38   CT HEAD WO CONTRAST ( )  Result Date: 10/31/2021 CLINICAL DATA:  88 year old female with history of trauma from a fall. Neck trauma. EXAM: CT HEAD WITHOUT CONTRAST CT CERVICAL SPINE WITHOUT CONTRAST TECHNIQUE: Multidetector CT imaging of the head and cervical spine was performed following the standard protocol without intravenous contrast. Multiplanar CT image reconstructions of the cervical spine were also generated. RADIATION DOSE REDUCTION: This exam was performed according to the departmental dose-optimization program which includes automated exposure control, adjustment of the mA and/or kV according to patient size and/or use of iterative reconstruction technique. COMPARISON:  Head and cervical spine CT 09/15/2020. FINDINGS: CT HEAD FINDINGS Brain: Moderate cerebral and mild cerebellar atrophy. Patchy and confluent areas of decreased attenuation are noted throughout the deep and periventricular white matter of the cerebral hemispheres bilaterally, compatible with chronic microvascular ischemic disease. No evidence of acute infarction, hemorrhage, hydrocephalus,  extra-axial collection or mass lesion/mass effect. Vascular: No hyperdense vessel or unexpected calcification. Skull: Normal. Negative for fracture or focal lesion. Sinuses/Orbits: No acute finding. Other: None. CT CERVICAL SPINE FINDINGS Alignment: 3 mm of anterolisthesis of C4 upon C5, similar to the prior study. Alignment is otherwise anatomic. Skull base and vertebrae: New compression fracture of superior endplate of T1 with approximately 15% loss of anterior vertebral body height, but without paravertebral soft tissue swelling to indicate an acute injury. No other acute displaced fracture noted within the cervical spine. Soft tissues and spinal canal: No prevertebral fluid or swelling. No visible canal hematoma. Disc levels: Multilevel degenerative disc disease, most pronounced at  C5-C6 and C6-C7. Mild multilevel facet arthropathy. Upper chest: Unremarkable. Other: None. IMPRESSION: 1. No evidence of significant acute traumatic injury to the skull or brain. 2. New but nonacute compression fracture of the superior endplate of T1 with 15% loss of anterior vertebral body height. 3. Moderate cerebral and mild cerebellar atrophy with chronic microvascular ischemic changes in the cerebral white matter, as above. 4. Multilevel degenerative disc disease and cervical spondylosis, as above. Electronically Signed   By: Trudie Reed M.D.   On: 10/31/2021 07:28   CT CERVICAL SPINE WO CONTRAST  Result Date: 10/31/2021 CLINICAL DATA:  88 year old female with history of trauma from a fall. Neck trauma. EXAM: CT HEAD WITHOUT CONTRAST CT CERVICAL SPINE WITHOUT CONTRAST TECHNIQUE: Multidetector CT imaging of the head and cervical spine was performed following the standard protocol without intravenous contrast. Multiplanar CT image reconstructions of the cervical spine were also generated. RADIATION DOSE REDUCTION: This exam was performed according to the departmental dose-optimization program which includes automated  exposure control, adjustment of the mA and/or kV according to patient size and/or use of iterative reconstruction technique. COMPARISON:  Head and cervical spine CT 09/15/2020. FINDINGS: CT HEAD FINDINGS Brain: Moderate cerebral and mild cerebellar atrophy. Patchy and confluent areas of decreased attenuation are noted throughout the deep and periventricular white matter of the cerebral hemispheres bilaterally, compatible with chronic microvascular ischemic disease. No evidence of acute infarction, hemorrhage, hydrocephalus, extra-axial collection or mass lesion/mass effect. Vascular: No hyperdense vessel or unexpected calcification. Skull: Normal. Negative for fracture or focal lesion. Sinuses/Orbits: No acute finding. Other: None. CT CERVICAL SPINE FINDINGS Alignment: 3 mm of anterolisthesis of C4 upon C5, similar to the prior study. Alignment is otherwise anatomic. Skull base and vertebrae: New compression fracture of superior endplate of T1 with approximately 15% loss of anterior vertebral body height, but without paravertebral soft tissue swelling to indicate an acute injury. No other acute displaced fracture noted within the cervical spine. Soft tissues and spinal canal: No prevertebral fluid or swelling. No visible canal hematoma. Disc levels: Multilevel degenerative disc disease, most pronounced at C5-C6 and C6-C7. Mild multilevel facet arthropathy. Upper chest: Unremarkable. Other: None. IMPRESSION: 1. No evidence of significant acute traumatic injury to the skull or brain. 2. New but nonacute compression fracture of the superior endplate of T1 with 15% loss of anterior vertebral body height. 3. Moderate cerebral and mild cerebellar atrophy with chronic microvascular ischemic changes in the cerebral white matter, as above. 4. Multilevel degenerative disc disease and cervical spondylosis, as above. Electronically Signed   By: Trudie Reed M.D.   On: 10/31/2021 07:28   DG HIP UNILAT WITH PELVIS 2-3 VIEWS  RIGHT  Result Date: 10/31/2021 CLINICAL DATA:  88 year old female status post unwitnessed fall. Right hip pain. EXAM: DG HIP (WITH OR WITHOUT PELVIS) 2-3V RIGHT COMPARISON:  Pelvis and hip series 11/29/2018. FINDINGS: Chronic left hip arthroplasty. Chronic left inferior pubic ramus fracture. Pelvis and visible left proximal femur appears stable. Impacted right femur intertrochanteric fracture, mildly comminuted. Right femoral head remains normally located. Calcified right femoral artery atherosclerosis. Negative visible lower abdominal and pelvic visceral contours. IMPRESSION: 1. Right femur intertrochanteric fracture with mild varus impaction. 2. No other No acute osseous abnormality identified. Chronic left hip arthroplasty, left inferior pubic ramus fracture. Electronically Signed   By: Odessa Fleming M.D.   On: 10/31/2021 07:39    Review of Systems  Unable to perform ROS: Dementia  Blood pressure 99/73, pulse 97, temperature 98.2 F (36.8 C), temperature source Oral,  resp. rate (!) 21, SpO2 100 %. Physical Exam Constitutional:      General: She is not in acute distress.    Appearance: She is well-developed. She is not diaphoretic.  HENT:     Head: Normocephalic and atraumatic.  Eyes:     General: No scleral icterus.       Right eye: No discharge.        Left eye: No discharge.     Conjunctiva/sclera: Conjunctivae normal.  Cardiovascular:     Rate and Rhythm: Normal rate and regular rhythm.  Pulmonary:     Effort: Pulmonary effort is normal. No respiratory distress.  Musculoskeletal:     Cervical back: Normal range of motion.     Comments: RLE No traumatic wounds, ecchymosis, or rash  Seemingly no TTP hip  No knee or ankle effusion  Knee stable to varus/ valgus and anterior/posterior stress  Sens DPN, SPN, TN could not assess  Motor EHL, ext, flex, evers could not assess  DP 1+, PT 0, No significant edema  Skin:    General: Skin is warm and dry.  Neurological:     Mental Status: She is  alert.  Psychiatric:        Mood and Affect: Mood normal.        Behavior: Behavior normal.    Assessment/Plan: Right hip fx -- Plan IMN today by Dr. Roda ShuttersXu. Please keep NPO.    Freeman CaldronMichael J. Natsuko Kelsay, PA-C Orthopedic Surgery [phone] 10/31/2021, 9:47 AM

## 2021-10-31 NOTE — Discharge Instructions (Signed)
° ° °  1. Change dressings as needed °2. May shower but keep incisions covered and dry °3. Take lovenox to prevent blood clots °4. Take stool softeners as needed °5. Take pain meds as needed ° °

## 2021-10-31 NOTE — H&P (Signed)
Date: 10/31/2021               Patient Name:  Heather Floyd MRN: [id number]  DOB: 02-Sep-1932 Age / Sex: 88 y.o., female   PCP: [facility]         Medical Service: Internal Medicine Teaching Service         Attending Physician: Dr. Ninetta Lights, Lacretia Leigh, MD    First Contact: Park Pope, MD Pager: [phone]  Second Contact: Thurmon Fair, MD Pager: Cecilie Kicks [phone]       After Hours (After 5p/  First Contact Pager: [phone]  weekends / holidays): Second Contact Pager: [phone]   SUBJECTIVE   Chief Complaint: Fall   History of Present Illness: Heather Floyd is a 33 year with advanced alzheimer's dementia and osteoporosis who presents after a fall this morning at nursing facility. Daughter is bedside and provides history. Reports staff her patient making noise and found her on the floor in her room. It appeared she had got out of bed and fell. She was brought to ED. Patient unable to provided history. I spoke to patients facility who is faxing me medical records. Other history taking from chart.    Meds:  No outpatient medications have been marked as taking for the 10/31/21 encounter [facility] Encounter).    Past Medical History:  Diagnosis Date   Abnormality of gait 09/08/2014   Carotid artery occlusion    Concussion with loss of consciousness 09/08/2014   Dementia (HCC)    DJD (degenerative joint disease) of cervical spine    Hyperlipidemia    Hypertension    Memory disturbance 03/29/2013   Osteoporosis    Personal history of colonic polyps    RLS (restless legs syndrome)    Sleep disorder    TIA (transient ischemic attack)    Varicose veins     Past Surgical History:  Procedure Laterality Date   ANTERIOR AND POSTERIOR VAGINAL REPAIR W/ SACROSPINOUS LIGAMENT SUSPENSION     CESAREAN SECTION     ENDOVENOUS ABLATION SAPHENOUS VEIN W/ LASER     HEMORRHOID SURGERY     JOINT REPLACEMENT     ORIF ELBOW FRACTURE Left 07/26/2018   Procedure: OPEN REDUCTION INTERNAL FIXATION  (ORIF) ELBOW/OLECRANON FRACTURE;  Surgeon: Tarry Kos, MD;  Location: [facility] OR;  Service: Orthopedics;  Laterality: Left;   REDUCTION MAMMAPLASTY     TOTAL HIP ARTHROPLASTY Left 10/19/2017   Procedure: ANTERIOR HEMI HIP;  Surgeon: Tarry Kos, MD;  Location: [facility] OR;  Service: Orthopedics;  Laterality: Left;    Social:  Lives [facility] tac[facility].  Support: Daughter, [name] , is healthcare power of attorney.  Level of Function: Dependent living PCP: [facility] Substances: Social History   Tobacco Use   Smoking status: Former    Types: Cigarettes    Quit date: 06/16/1980    Years since quitting: 41.4   Smokeless tobacco: Never  Vaping Use   Vaping Use: Never used  Substance Use Topics   Alcohol use: No   Drug use: No     Family History:  Family History  Problem Relation Age of Onset   Mental illness Mother    Alzheimer's disease Mother    Cancer Father    Bone cancer Father    Dementia Sister      Allergies: Allergies as of 10/31/2021 - Review Complete 10/31/2021  Allergen Reaction Noted   Aspirin Other (See Comments) 05/04/2013   Codeine Nausea Only  02/04/2011    Review of Systems: Unable to completed due to alzheimers   OBJECTIVE:   Physical Exam: Blood pressure (!) 148/77, pulse 87, SpO2 97 %.   Physical Exam Constitutional:      Appearance: She is normal weight.  HENT:     Head:     Comments: Hematoma near crown Eyes:     General: No scleral icterus.    Pupils: Pupils are equal, round, and reactive to light.  Cardiovascular:     Rate and Rhythm: Normal rate and regular rhythm.  Pulmonary:     Effort: Pulmonary effort is normal.     Breath sounds: Normal breath sounds.  Abdominal:     General: Bowel sounds are normal.     Palpations: Abdomen is soft.  Musculoskeletal:        General: No swelling.     Cervical back: Neck supple.  Skin:    Findings: No bruising or erythema.     Comments: Feet warm, nl cap refill.    Neurological:     Mental Status: She is alert.  Psychiatric:     Comments: Alert, not oriented to person place or time at baseline     ASSESSMENT & PLAN:    Assessment & Plan by Problem: Principal Problem:   Hip fracture (HCC)   Heather Floyd is a 88 y.o. with advanced Alzheimer's dementia and osteoporosis admitted right femur intertrochanteric fracture.  #Right femur intertrochanteric fracture  - orthopedic surgery consulted by ED, appreciate consult. - NPO, sips with meds - Scheduled acetaminophen 1000 mg QID for mild pain, scheduled oxycodone 5 mg q4h for moderate pain, Dilaudid 1 mg q2h for breakthrough pain  #Alzheimer Dementia - Continue home regimen for agitation: Sertraline, Quetiapine, Trazodone , atarax - Patient daughter reports patient has history of getting out of bed. 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  Patients daughters will try to make arrangements to sit with patient during the day, but patient will require sitter at night. She will keep team updated on their plans to be at hospital with patient.    Diet: NPO VTE: Enoxaparin IVF: None,None Code: DNR  Prior to Admission Living Arrangement: SNF,   Anticipated Discharge Location: SNF Barriers to Discharge: Surgery  Dispo: Admit patient to Inpatient with expected length of stay greater than 2 midnights.  Signed: Cleotilde Neer, MD Internal Medicine Resident PGY-3 Pager: [phone]  10/31/2021, 8:59 AM

## 2021-10-31 NOTE — Transfer of Care (Signed)
Immediate Anesthesia Transfer of Care Note  Patient: SHALINI STINEBAUGH  Procedure(s) Performed: RIGHT INTRAMEDULLARY (IM) NAIL INTERTROCHANTRIC (Right)  Patient Location: PACU  Anesthesia Type:General  Level of Consciousness: drowsy and patient cooperative  Airway & Oxygen Therapy: Patient Spontanous Breathing and Patient connected to face mask oxygen  Post-op Assessment: Report given to RN and Post -op Vital signs reviewed and stable  Post vital signs: Reviewed and stable  Last Vitals:  Vitals Value Taken Time  BP    Temp    Pulse 132 10/31/21 1428  Resp    SpO2 80 % 10/31/21 1428  Vitals shown include unvalidated device data.  Last Pain:  Vitals:   10/31/21 1125  TempSrc: Oral         Complications: No notable events documented.

## 2021-10-31 NOTE — ED Notes (Signed)
I was going to attempt to get a rectal temp on pt, but daughter refused and said, "I'm not going to put my mother through that."

## 2021-10-31 NOTE — Anesthesia Procedure Notes (Signed)

## 2021-11-01 ENCOUNTER — Other Ambulatory Visit: Payer: Self-pay

## 2021-11-01 ENCOUNTER — Encounter (HOSPITAL_COMMUNITY): Payer: Self-pay | Admitting: Orthopaedic Surgery

## 2021-11-01 LAB — CBC
HCT: 27.1 % — ABNORMAL LOW (ref 36.0–46.0)
Hemoglobin: 9.2 g/dL — ABNORMAL LOW (ref 12.0–15.0)
MCH: 30.4 pg (ref 26.0–34.0)
MCHC: 33.9 g/dL (ref 30.0–36.0)
MCV: 89.4 fL (ref 80.0–100.0)
Platelets: 155 10*3/uL (ref 150–400)
RBC: 3.03 MIL/uL — ABNORMAL LOW (ref 3.87–5.11)
RDW: 12.9 % (ref 11.5–15.5)
WBC: 11 10*3/uL — ABNORMAL HIGH (ref 4.0–10.5)
nRBC: 0 % (ref 0.0–0.2)

## 2021-11-01 MED ORDER — VANCOMYCIN HCL IN DEXTROSE 1-5 GM/200ML-% IV SOLN
1000.0000 mg | INTRAVENOUS | Status: AC
  Administered 2021-11-01 – 2021-11-03 (×2): 1000 mg via INTRAVENOUS
  Filled 2021-11-01 (×2): qty 200

## 2021-11-01 NOTE — TOC CAGE-AID Note (Signed)
Transition of Care [facility]) - CAGE-AID Screening   Patient Details  Name: Heather Floyd MRN: [id number] Date of Birth: [birth year]  Transition of Care [facility]) CM/SW Contact:    Boston Catarino C Tarpley-Carter, LCSWA Phone Number: 11/01/2021, 11:04 AM   Clinical Narrative: Pt is unable to participate in Cage Aid. Pt is experiencing dementia.  Charley Lafrance Tarpley-Carter, MSW, LCSW-A Pronouns:  She/Her/Hers [facility] Clinical Social Worker Direct Number:  [phone] [email or web address]  CAGE-AID Screening: Substance Abuse Screening unable to be completed due to: : Patient unable to participate

## 2021-11-01 NOTE — Evaluation (Signed)
Clinical/Bedside Swallow Evaluation Patient Details  Name: Heather Floyd MRN: [id number] Date of Birth: 1933-01-26  Today's Date: 11/01/2021 Time: SLP Start Time (ACUTE ONLY): 0901 SLP Stop Time (ACUTE ONLY): 0909 SLP Time Calculation (min) (ACUTE ONLY): 8 min  Past Medical History:  Past Medical History:  Diagnosis Date   Abnormality of gait 09/08/2014   Carotid artery occlusion    Concussion with loss of consciousness 09/08/2014   Dementia (HCC)    DJD (degenerative joint disease) of cervical spine    Hyperlipidemia    Hypertension    Memory disturbance 03/29/2013   Osteoporosis    Personal history of colonic polyps    RLS (restless legs syndrome)    Sleep disorder    TIA (transient ischemic attack)    Varicose veins    Past Surgical History:  Past Surgical History:  Procedure Laterality Date   ANTERIOR AND POSTERIOR VAGINAL REPAIR W/ SACROSPINOUS LIGAMENT SUSPENSION     CESAREAN SECTION     ENDOVENOUS ABLATION SAPHENOUS VEIN W/ LASER     HEMORRHOID SURGERY     JOINT REPLACEMENT     ORIF ELBOW FRACTURE Left 07/26/2018   Procedure: OPEN REDUCTION INTERNAL FIXATION (ORIF) ELBOW/OLECRANON FRACTURE;  Surgeon: Tarry Kos, MD;  [facility];  Service: Orthopedics;  Laterality: Left;   REDUCTION MAMMAPLASTY     TOTAL HIP ARTHROPLASTY Left 10/19/2017   Procedure: ANTERIOR HEMI HIP;  Surgeon: Tarry Kos, MD;  Location: [facility] OR;  Service: Orthopedics;  Laterality: Left;   HPI:  [name] is a 88 y.o.-year-old female who sustained a right hip fracture and underwent open treatment of fracture iwth intramedullary implant 5/18. PMH; TIA, dementia.    Assessment / Plan / Recommendation  Clinical Impression  88 yr old with dementia and daughter at bedside assessed for swallow function. Daughter reports occasional coughing with liquids but not concerning. She is endentulous and consumes chopped meats and softer textures. Could not formally evaluate cough or oral-motor although there  was no focal weakness noted. Pt able to masticate, manipulate soft mini muffin without difficulty. She coughed after muffin with thin water x 1 and other single sips there was no concern with aspiration. Educated daughter re: nature of swallow progression that can occur with pt's who have dementia and she understood as well as standard precautions for safe intake. Daughter denied pt currently holds po's in oral cavity. Continue Dys 3 texture, thin liquids, pills crushed meds and full supervision. No furhter ST warranted at this time. SLP Visit Diagnosis: Dysphagia, unspecified (R13.10)    Aspiration Risk  Mild aspiration risk    Diet Recommendation Dysphagia 3 (Mech soft);Thin liquid   Liquid Administration via: Cup;Straw Medication Administration: Crushed with puree Supervision: Staff to assist with self feeding;Full supervision/cueing for compensatory strategies Compensations: Minimize environmental distractions;Slow rate;Small sips/bites;Lingual sweep for clearance of pocketing Postural Changes: Seated upright at 90 degrees;Remain upright for at least 30 minutes after po intake    Other  Recommendations Oral Care Recommendations: Oral care BID    Recommendations for follow up therapy are one component of a multi-disciplinary discharge planning process, led by the attending physician.  Recommendations may be updated based on patient status, additional functional criteria and insurance authorization.  Follow up Recommendations No SLP follow up      Assistance Recommended at Discharge None  Functional Status Assessment Patient has had a recent decline in their functional status and demonstrates the ability to make significant improvements in function in a reasonable and predictable  amount of time.  Frequency and Duration            Prognosis        Swallow Study   General Date of Onset: 10/31/21 HPI: [name] is a 88 y.o.-year-old female who sustained a right hip fracture and  underwent open treatment of fracture iwth intramedullary implant 5/18. PMH; TIA, dementia. Type of Study: Bedside Swallow Evaluation Previous Swallow Assessment:  (2019 reg/thin) Diet Prior to this Study: Dysphagia 3 (soft);Thin liquids Temperature Spikes Noted: No Respiratory Status: Room air History of Recent Intubation: Yes Length of Intubations (days):  (during surgery) Date extubated: 10/31/21 Behavior/Cognition: Alert;Cooperative;Pleasant mood;Confused;Distractible;Requires cueing Oral Cavity Assessment: Within Functional Limits Oral Care Completed by SLP: No Oral Cavity - Dentition: Edentulous (no dentures- ill fitting) Vision: Functional for self-feeding Self-Feeding Abilities: Needs assist Patient Positioning: Upright in bed Baseline Vocal Quality: Normal Volitional Cough: Cognitively unable to elicit Volitional Swallow: Unable to elicit    Oral/Motor/Sensory Function Overall Oral Motor/Sensory Function:  (no focal weakness- unable to assess formally)   Ice Chips Ice chips: Not tested   Thin Liquid Thin Liquid: Impaired Pharyngeal  Phase Impairments: Cough - Immediate    Nectar Thick Nectar Thick Liquid: Not tested   Honey Thick Honey Thick Liquid: Not tested   Puree Puree: Not tested   Solid     Solid: Within functional limits (soft)      Royce Macadamia 11/01/2021,9:22 AM

## 2021-11-01 NOTE — NC FL2 (Signed)
[place] LEVEL OF CARE SCREENING TOOL     IDENTIFICATION  Patient Name: Heather Floyd Birthdate: 22-Nov-1932 Sex: female Admission Date (Current Location): 10/31/2021  [facility] and [place] Number:  Herbalist and Address:  [facility], [place] [address] [place], [place] [address]      Provider Number: [id number]  Attending Physician Name and Address:  Campbell Riches, MD  Relative Name and Phone Number:  Randa Lynn Daughter [phone]  [phone]    Current Level of Care: Hospital Recommended Level of Care: [place] Prior Approval Number:    Date Approved/Denied:   PASRR Number: [id number]  Discharge Plan: SNF    Current Diagnoses: Patient Active Problem List   Diagnosis Date Noted   Hip fracture ([place]) 10/31/2021   Closed displaced intertrochanteric fracture of right femur ([place])    Hyperglycemia    Pain in left elbow 08/10/2018   Rupture of left triceps tendon 07/26/2018   Closed olecranon fracture, left, initial encounter 07/24/2018   Pubic ramus fracture ([place]) 07/24/2018   Closed fracture of left inferior pubic ramus ([place])    Fall 07/23/2018   Acute lower UTI 07/23/2018   Palliative care encounter    Colitis 08/25/2017   Abdominal pain 04/08/2017   Leukocytosis 04/08/2017   Dementia ([place]) 04/08/2017   Dehydration 04/08/2017   Abnormal urinalysis 04/08/2017   Enteritis 04/07/2017   Abnormality of gait 09/08/2014   Concussion with loss of consciousness 09/08/2014   Subdural hematoma ([place]) 08/11/2014   Hypokalemia 08/11/2014   SDH (subdural hematoma) ([place]) 08/11/2014   Hyperlipidemia    Hypertension    RLS (restless legs syndrome)    DJD (degenerative joint disease) of cervical spine    TIA (transient ischemic attack)    Carotid artery occlusion    Essential hypertension    Memory disturbance 03/29/2013   Near syncope 01/13/2013   Accelerated hypertension 01/13/2013   Hyperkalemia  01/13/2013    Orientation RESPIRATION BLADDER Height & Weight      (not oriented)  O2 Incontinent, Indwelling catheter Weight: 113 lb (51.3 kg) Height:  5\' 1"  (154.9 cm)  BEHAVIORAL SYMPTOMS/MOOD NEUROLOGICAL BOWEL NUTRITION STATUS      Incontinent Diet (see discharge summary)  AMBULATORY STATUS COMMUNICATION OF NEEDS Skin   Total Care Verbally Surgical wounds                       Personal Care Assistance Level of Assistance  Total care Bathing Assistance: Maximum assistance Feeding assistance: Limited assistance Dressing Assistance: Maximum assistance Total Care Assistance: Maximum assistance   Functional Limitations Info  Sight, Hearing, Speech Sight Info: Adequate Hearing Info: Impaired Speech Info: Adequate    SPECIAL CARE FACTORS FREQUENCY  PT (By licensed PT), OT (By licensed OT)     PT Frequency: 5x week OT Frequency: 5x week            Contractures Contractures Info: Not present    Additional Factors Info  Code Status, Allergies Code Status Info: DNR Allergies Info: Aspirin, Codeine           Current Medications (11/01/2021):  This is the current hospital active medication list Current Facility-Administered Medications  Medication Dose Route Frequency Provider Last Rate Last Admin   0.9 %  sodium chloride infusion   Intravenous Continuous Leandrew Koyanagi, MD 75 mL/hr at 11/01/21 0727 New Bag at 11/01/21 0727   acetaminophen (TYLENOL) tablet 1,000 mg  1,000 mg Oral Q6H  Leandrew Koyanagi, MD   1,000 mg at 10/31/21 2349   acetaminophen (TYLENOL) tablet 325-650 mg  325-650 mg Oral Q6H PRN Leandrew Koyanagi, MD       alum & mag hydroxide-simeth (MAALOX/MYLANTA) 200-200-20 MG/5ML suspension 30 mL  30 mL Oral Q4H PRN Leandrew Koyanagi, MD       Chlorhexidine Gluconate Cloth 2 % PADS 6 each  6 each Topical Q0600 Campbell Riches, MD       docusate sodium (COLACE) capsule 100 mg  100 mg Oral BID Leandrew Koyanagi, MD   100 mg at 10/31/21 2010   enoxaparin (LOVENOX)  injection 40 mg  40 mg Subcutaneous Q24H Leandrew Koyanagi, MD       feeding supplement (ENSURE ENLIVE / ENSURE PLUS) liquid 237 mL  237 mL Oral TID BM Campbell Riches, MD   237 mL at 11/01/21 0930   HYDROmorphone (DILAUDID) injection 0.5-1 mg  0.5-1 mg Intravenous Q4H PRN Leandrew Koyanagi, MD   1 mg at 11/01/21 1118   HYDROmorphone (DILAUDID) injection 1 mg  1 mg Intravenous Q2H PRN Leandrew Koyanagi, MD   1 mg at 10/31/21 1025   hydrOXYzine (ATARAX) tablet 25 mg  25 mg Oral BID Leandrew Koyanagi, MD   25 mg at 11/01/21 [id number]   loperamide (IMODIUM) capsule 2 mg  2 mg Oral Q4H PRN Leandrew Koyanagi, MD       menthol-cetylpyridinium (CEPACOL) lozenge 3 mg  1 lozenge Oral PRN Leandrew Koyanagi, MD       Or   phenol (CHLORASEPTIC) mouth spray 1 spray  1 spray Mouth/Throat PRN Leandrew Koyanagi, MD       methocarbamol (ROBAXIN) tablet 500 mg  500 mg Oral Q6H PRN Leandrew Koyanagi, MD   500 mg at 10/31/21 2006   Or   methocarbamol (ROBAXIN) 500 mg in dextrose 5 % 50 mL IVPB  500 mg Intravenous Q6H PRN Leandrew Koyanagi, MD       mupirocin ointment (BACTROBAN) 2 % 1 application.  1 application. Nasal BID Campbell Riches, MD   1 application. at 11/01/21 0928   ondansetron (ZOFRAN) tablet 4 mg  4 mg Oral Q6H PRN Leandrew Koyanagi, MD       Or   ondansetron [facility]) injection 4 mg  4 mg Intravenous Q6H PRN Leandrew Koyanagi, MD       oxyCODONE (Oxy IR/ROXICODONE) immediate release tablet 10-15 mg  10-15 mg Oral Q4H PRN Leandrew Koyanagi, MD       oxyCODONE (Oxy IR/ROXICODONE) immediate release tablet 5-10 mg  5-10 mg Oral Q4H PRN Leandrew Koyanagi, MD   5 mg at 11/01/21 0706   polyethylene glycol (MIRALAX / GLYCOLAX) packet 17 g  17 g Oral Daily PRN Leandrew Koyanagi, MD       QUEtiapine (SEROQUEL) tablet 12.5 mg  12.5 mg Oral BID Leandrew Koyanagi, MD   12.5 mg at 11/01/21 0932   senna-docusate (Senokot-S) tablet 1 tablet  1 tablet Oral QPM Leandrew Koyanagi, MD   1 tablet at 10/31/21 1829   sertraline (ZOLOFT) tablet 100 mg  100 mg Oral Daily Leandrew Koyanagi,  MD   100 mg at 11/01/21 0928   sorbitol 70 % solution 30 mL  30 mL Oral Daily PRN Leandrew Koyanagi, MD       tranexamic acid (CYKLOKAPRON) 2,000 mg in sodium chloride 0.9 % 50 mL Topical Application  [id number]  mg Topical Once Campbell Riches, MD       traZODone (DESYREL) tablet 75 mg  75 mg Oral QHS Leandrew Koyanagi, MD       vancomycin (VANCOCIN) IVPB 1000 mg/200 mL premix  1,000 mg Intravenous Q48H Leandrew Koyanagi, MD 200 mL/hr at 11/01/21 1125 1,000 mg at 11/01/21 1125   [START ON 11/05/2021] Vitamin D (Ergocalciferol) (DRISDOL) capsule 50,000 Units  50,000 Units Oral Q Alben Spittle, MD         Discharge Medications: Please see discharge summary for a list of discharge medications.  Relevant Imaging Results:  Relevant Lab Results:   Additional Information SSN: [id number]. Pt is vaccinated for covid but not boosted.  Joanne Chars, LCSW

## 2021-11-01 NOTE — Progress Notes (Addendum)
   Subjective:  Patient reports pain as severe when attempting PT today.  Otherwise resting peacefully in bed.  Ate good breakfast.   Objective:   VITALS:   Vitals:   10/31/21 2014 10/31/21 2356 11/01/21 0401 11/01/21 0830  BP: (!) 148/88 95/68 (!) 105/53 (!) 151/95  Pulse: 100 98 93 88  Resp: 18  18 17   Temp: 97.8 F (36.6 C)  98.3 F (36.8 C) 98.4 F (36.9 C)  TempSrc: Axillary  Axillary Oral  SpO2: 93%  92% 94%  Weight:      Height:        Intact pulses distally Incision: dressing C/D/I and no drainage   Lab Results  Component Value Date   WBC 11.0 (H) 11/01/2021   HGB 9.2 (L) 11/01/2021   HCT 27.1 (L) 11/01/2021   MCV 89.4 11/01/2021   PLT 155 11/01/2021     Assessment/Plan:  1 Day Post-Op   - Expected postop acute blood loss anemia - will monitor for symptoms - continue PT/OT - will likely need to be here a few days before going to SNF  Weightbearing: WBAT RLE Insicional and dressing care: Dressings left intact until follow-up Orthopedic device(s): None Showering: sponge bath only VTE prophylaxis: Lovenox 40mg  qd  4 weeks Pain control: norco Rx in chart Follow - up plan: 2 weeks Contact information:  Frankey Shown MD, Tawanna Cooler PA    Eduard Roux 11/01/2021, 11:35 AM

## 2021-11-01 NOTE — TOC Initial Note (Signed)
Transition of Care [facility]) - Initial/Assessment Note    Patient Details  Name: Heather Floyd MRN: [id number] Date of Birth: 15-Jan-1933  Transition of Care [facility]) CM/SW Contact:    Joanne Chars, LCSW Phone Number: 11/01/2021, 11:21 AM  Clinical Narrative:  Pt not oriented, CSW met with daughter [name], son Waunita Schooner in room.  Pt is from memory care at [facility].  They are agreeable to SNF recommendation and are planning to hire [facility] aide to sit with pt while there.  They do not want to pursue immediate return to [facility] and would like rehab first.  Choice document provided, permission given to send out referral in hub.  Pt is vaccinated for covid but not boosted.                   Expected Discharge Plan: Skilled Nursing Facility Barriers to Discharge: Continued Medical Work up, SNF Pending bed offer   Patient Goals and CMS Choice   CMS Medicare.gov Compare Post Acute Care list provided to:: Patient Represenative (must comment) Choice offered to / list presented to : Adult Children (daughter [name])  Expected Discharge Plan and Services Expected Discharge Plan: [place] In-house Referral: Clinical Social Work   Post Acute Care Choice: [place] Living arrangements for the past 2 months: [place] ([facility] memory care)                                      Prior Living Arrangements/Services Living arrangements for the past 2 months: [place] ([facility] memory care) Lives with:: Facility Resident Patient language and need for interpreter reviewed:: No        Need for Family Participation in Patient Care: Yes (Comment) Care giver support system in place?: Yes (comment) Current home services: Other (comment) (na) Criminal Activity/Legal Involvement Pertinent to Current Situation/Hospitalization: No - Comment as needed  Activities of Daily Living Home  Assistive Devices/Equipment: None ADL Screening (condition at time of admission) Patient's cognitive ability adequate to safely complete daily activities?: No Is the patient deaf or have difficulty hearing?: No Does the patient have difficulty seeing, even when wearing glasses/contacts?: No Does the patient have difficulty concentrating, remembering, or making decisions?: Yes Patient able to express need for assistance with ADLs?: No Does the patient have difficulty dressing or bathing?: Yes Independently performs ADLs?: No Communication: Needs assistance Is this a change from baseline?: Pre-admission baseline Dressing (OT): Needs assistance Is this a change from baseline?: Pre-admission baseline Grooming: Needs assistance Is this a change from baseline?: Pre-admission baseline Feeding: Independent Bathing: Dependent Is this a change from baseline?: Pre-admission baseline Toileting: Needs assistance Is this a change from baseline?: Pre-admission baseline In/Out Bed: Independent Walks in Home: Independent Does the patient have difficulty walking or climbing stairs?: Yes Weakness of Legs: Both Weakness of Arms/Hands: None  Permission Sought/Granted                  Emotional Assessment Appearance:: Appears stated age Attitude/Demeanor/Rapport: Unable to Assess Affect (typically observed): Unable to Assess Orientation: :  (not oriented) Alcohol / Substance Use: Not Applicable Psych Involvement: No (comment)  Admission diagnosis:  Hip fracture ([place]) [S72.009A] Fall, initial encounter [W19.XXXA] Closed displaced intertrochanteric fracture of right femur, initial encounter ([place]) [S72.141A] Dementia with agitation, unspecified dementia severity, unspecified dementia type ([place]) [F03.911] Patient Active Problem  List   Diagnosis Date Noted   Hip fracture ([place]) 10/31/2021   Closed displaced intertrochanteric fracture of right femur ([place])    Hyperglycemia    Pain in left elbow  08/10/2018   Rupture of left triceps tendon 07/26/2018   Closed olecranon fracture, left, initial encounter 07/24/2018   Pubic ramus fracture ([place]) 07/24/2018   Closed fracture of left inferior pubic ramus ([place])    Fall 07/23/2018   Acute lower UTI 07/23/2018   Palliative care encounter    Colitis 08/25/2017   Abdominal pain 04/08/2017   Leukocytosis 04/08/2017   Dementia ([place]) 04/08/2017   Dehydration 04/08/2017   Abnormal urinalysis 04/08/2017   Enteritis 04/07/2017   Abnormality of gait 09/08/2014   Concussion with loss of consciousness 09/08/2014   Subdural hematoma ([place]) 08/11/2014   Hypokalemia 08/11/2014   SDH (subdural hematoma) ([place]) 08/11/2014   Hyperlipidemia    Hypertension    RLS (restless legs syndrome)    DJD (degenerative joint disease) of cervical spine    TIA (transient ischemic attack)    Carotid artery occlusion    Essential hypertension    Memory disturbance 03/29/2013   Near syncope 01/13/2013   Accelerated hypertension 01/13/2013   Hyperkalemia 01/13/2013   PCP:  [facility] Pharmacy:  No Pharmacies Listed    Social Determinants of Health (SDOH) Interventions    Readmission Risk Interventions     View : No data to display.

## 2021-11-01 NOTE — Evaluation (Signed)
Physical Therapy Evaluation Patient Details Name: Heather Floyd MRN: [id number] DOB: December 24, 1932 Today's Date: 11/01/2021  History of Present Illness  Pt is 88 y/o female admitted after a fall at her memory care facility resulting in R hip fx. Pt underwent IM nailing of R hip on 5/18. PMH: Alzheimer's disease, CKD, osteoporosis, HTN, TIA  Clinical Impression  Pt was seen for mobility on side of bed with total assist to attain this posture, due to pain and her cognition.  Pt is both fearful and in pain, and nursing was in to observe and talk with MD about what pain meds might be appropriate given pt cognition.  Follow her to prepare for transition to SNF, to increase active use of LE's, to improve sitting balance control, to try to stand when pt is ready and for time OOB to chair as is safely possible.  Follow acutely for goals of PT as are outlined below.        Recommendations for follow up therapy are one component of a multi-disciplinary discharge planning process, led by the attending physician.  Recommendations may be updated based on patient status, additional functional criteria and insurance authorization.  Follow Up Recommendations Skilled nursing-short term rehab (<3 hours/day)    Assistance Recommended at Discharge Frequent or constant Supervision/Assistance  Patient can return home with the following  Two people to help with walking and/or transfers;Two people to help with bathing/dressing/bathroom;Direct supervision/assist for medications management;Direct supervision/assist for financial management;Assist for transportation;Help with stairs or ramp for entrance    Equipment Recommendations None recommended by PT  Recommendations for Other Services       Functional Status Assessment Patient has had a recent decline in their functional status and/or demonstrates limited ability to make significant improvements in function in a reasonable and predictable amount of time      Precautions / Restrictions Precautions Precautions: Fall Restrictions Weight Bearing Restrictions: Yes RLE Weight Bearing: Weight bearing as tolerated      Mobility  Bed Mobility Overal bed mobility: Needs Assistance Bed Mobility: Supine to Sit, Sit to Supine     Supine to sit: Total assist, +2 for physical assistance, +2 for safety/equipment, HOB elevated Sit to supine: Total assist, +2 for physical assistance, +2 for safety/equipment   General bed mobility comments: unable to follow directions, active resistance against movement with posterior lean    Transfers                   General transfer comment: unsafe to attempt    Ambulation/Gait                  Stairs            Wheelchair Mobility    Modified Rankin (Stroke Patients Only)       Balance Overall balance assessment: Needs assistance Sitting-balance support: Feet supported Sitting balance-Leahy Scale: Zero Sitting balance - Comments: difficutt to quantify as pt actively extends trunk with sitting against the therapists Postural control: Posterior lean, Right lateral lean                                   Pertinent Vitals/Pain Pain Assessment Pain Assessment: Faces Faces Pain Scale: Hurts whole lot Breathing: occasional labored breathing, short period of hyperventilation Negative Vocalization: repeated troubled calling out, loud moaning/groaning, crying Facial Expression: facial grimacing Body Language: tense, distressed pacing, fidgeting Pain Location: R LE with movement Pain Descriptors /  Indicators: Guarding, Grimacing, Crying, Aching Pain Intervention(s): Limited activity within patient's tolerance, Monitored during session, Premedicated before session, Repositioned, Other (comment) (family asked for pain meds)    Home Living Family/patient expects to be discharged to:: Skilled nursing facility                   Additional Comments: from memory  care    Prior Function Prior Level of Function : Needs assist  Cognitive Assist : Mobility (cognitive);ADLs (cognitive) Mobility (Cognitive): Intermittent cues ADLs (Cognitive): Intermittent cues Physical Assist : Mobility (physical) Mobility (physical): Gait   Mobility Comments: walking with no AD       Hand Dominance   Dominant Hand: Right    Extremity/Trunk Assessment   Upper Extremity Assessment Upper Extremity Assessment: Defer to OT evaluation    Lower Extremity Assessment Lower Extremity Assessment: Generalized weakness;RLE deficits/detail RLE Deficits / Details: pt will not move the RLE, gave full assistance to move to side of bed and back RLE: Unable to fully assess due to pain RLE Coordination: decreased gross motor    Cervical / Trunk Assessment Cervical / Trunk Assessment: Kyphotic  Communication   Communication: Expressive difficulties;Other (comment) (conversation not always appropriate in pt's response to others)  Cognition Arousal/Alertness: Awake/alert Behavior During Therapy: Restless, Impulsive, Anxious Overall Cognitive Status: History of cognitive impairments - at baseline                                 General Comments: dementia, unable to follow even one step commands, actively resisting        General Comments General comments (skin integrity, edema, etc.): pt's children were in attendance and tried to encourage pt to engage in therapy but were mainly a familiar presence during the session    Exercises     Assessment/Plan    PT Assessment Patient needs continued PT services  PT Problem List Decreased strength;Decreased range of motion;Decreased activity tolerance;Decreased balance;Decreased mobility;Decreased coordination;Decreased cognition;Decreased knowledge of use of DME;Decreased safety awareness;Decreased skin integrity;Pain       PT Treatment Interventions DME instruction;Gait training;Stair training;Functional  mobility training;Therapeutic activities;Therapeutic exercise;Balance training;Neuromuscular re-education;Patient/family education    PT Goals (Current goals can be found in the Care Plan section)  Acute Rehab PT Goals Patient Stated Goal: none stated PT Goal Formulation: With family Time For Goal Achievement: 11/15/21 Potential to Achieve Goals: Fair    Frequency Min 4X/week     Co-evaluation PT/OT/SLP Co-Evaluation/Treatment: Yes Reason for Co-Treatment: Complexity of the patient's impairments (multi-system involvement);Necessary to address cognition/behavior during functional activity;For patient/therapist safety;To address functional/ADL transfers PT goals addressed during session: Mobility/safety with mobility;Balance OT goals addressed during session: ADL's and self-care       AM-PAC PT "6 Clicks" Mobility  Outcome Measure Help needed turning from your back to your side while in a flat bed without using bedrails?: A Lot Help needed moving from lying on your back to sitting on the side of a flat bed without using bedrails?: Total Help needed moving to and from a bed to a chair (including a wheelchair)?: Total Help needed standing up from a chair using your arms (e.g., wheelchair or bedside chair)?: Total Help needed to walk in hospital room?: Total Help needed climbing 3-5 steps with a railing? : Total 6 Click Score: 7    End of Session Equipment Utilized During Treatment: Gait belt Activity Tolerance: Patient limited by fatigue;Patient limited by pain;Treatment limited secondary to  agitation Patient left: in bed;with call bell/phone within reach;with bed alarm set;with family/visitor present;with nursing/sitter in room Nurse Communication: Mobility status PT Visit Diagnosis: Muscle weakness (generalized) (M62.81);Pain;Difficulty in walking, not elsewhere classified (R26.2)    Time: [id number] PT Time Calculation (min) (ACUTE ONLY): 23 min   Charges:   PT  Evaluation $PT Eval Moderate Complexity: 1 Mod         Ivar DrapeRuth E Nolan Tuazon 11/01/2021, 1:31 PM  Samul Dadauth Janathan Bribiesca, PT PhD Acute Rehab Dept. Number: [facility] [phone] and [facility] [phone]

## 2021-11-01 NOTE — Progress Notes (Signed)
PHARMACY NOTE:  ANTIMICROBIAL RENAL DOSAGE ADJUSTMENT  Current antimicrobial regimen includes a mismatch between antimicrobial dosage and estimated renal function.  As per policy approved by the Pharmacy & Therapeutics and Medical Executive Committees, the antimicrobial dosage will be adjusted accordingly.  Current antimicrobial dosage:  Vancomycin 1000 mg IV q12h x 2 doses   Indication: surgical prophylaxis s/p  right hip intramedullary implant   Renal Function: Estimated Creatinine Clearance: 26.7 mL/min (A) (by C-G formula based on SCr of 1.14 mg/dL (H)). []      On intermittent HD, scheduled: []      On CRRT    Antimicrobial dosage has been changed to:  Vancomycin 1000 mg IV q48 hr x 2 doses.   Additional comments:   Thank you for allowing pharmacy to be a part of this patient's care.  , RPh Clinical Pharmacist [phone] 11/01/2021 8:59 AM Please check AMION for all [facility] Pharmacy phone numbers After 10:00 PM, call Main Pharmacy [phone]

## 2021-11-01 NOTE — Evaluation (Addendum)
Occupational Therapy Evaluation Patient Details Name: Heather Floyd MRN: [id number] DOB: 06/08/1933 Today's Date: 11/01/2021   History of Present Illness Pt is 88 y/o female admitted after a fall at her memory care facility resulting in R hip fx. Pt underwent IM nailing of R hip on 5/18. PMH: Alzheimer's disease, CKD, osteoporosis, HTN, TIA   Clinical Impression   PTA, pt resides at a memory care facility and typically ambulatory without a device. Pt presents now s/p fx/sx above with significant R LE pain with movement today. Due to advanced dementia, pt unable to follow directions and actively resisting EOB attempts, requiring Total A x 2 for bed mobility. Due to cognitive deficits, pt requiring Max-Total A for ADLs in this unfamiliar setting. Deferred standing attempts today due to pt's pain levels. Pt's family present, unsure if pt's memory care can provide the physical assist currently needed and considering SNF rehab. Pending pt's ability to participate and pain mgmt, may be able to return to PLOF with SNF rehab stay.      Recommendations for follow up therapy are one component of a multi-disciplinary discharge planning process, led by the attending physician.  Recommendations may be updated based on patient status, additional functional criteria and insurance authorization.   Follow Up Recommendations  Skilled nursing-short term rehab (<3 hours/day) (unless memory care able to provide the physical assist pt currently requires)    Assistance Recommended at Discharge Frequent or constant Supervision/Assistance  Patient can return home with the following Two people to help with walking and/or transfers;Two people to help with bathing/dressing/bathroom    Functional Status Assessment  Patient has had a recent decline in their functional status and demonstrates the ability to make significant improvements in function in a reasonable and predictable amount of time.  Equipment Recommendations   None recommended by OT    Recommendations for Other Services       Precautions / Restrictions Precautions Precautions: Fall Restrictions Weight Bearing Restrictions: Yes RLE Weight Bearing: Weight bearing as tolerated      Mobility Bed Mobility Overal bed mobility: Needs Assistance Bed Mobility: Supine to Sit, Sit to Supine     Supine to sit: Total assist, +2 for physical assistance, +2 for safety/equipment, HOB elevated Sit to supine: Total assist, +2 for physical assistance, +2 for safety/equipment   General bed mobility comments: unable to follow directions, active resistance against movement with posterior lean    Transfers                   General transfer comment: deferred      Balance Overall balance assessment: Needs assistance Sitting-balance support: Feet supported, No upper extremity supported Sitting balance-Leahy Scale: Zero Sitting balance - Comments: Max A to Total for balance with heavy posterior lean initially but with offloading to R elbow, able to sustain with Min A                                   ADL either performed or assessed with clinical judgement   ADL Overall ADL's : Needs assistance/impaired Eating/Feeding: Minimal assistance;Bed level   Grooming: Maximal assistance;Bed level   Upper Body Bathing: Total assistance   Lower Body Bathing: Total assistance   Upper Body Dressing : Total assistance   Lower Body Dressing: Total assistance       Toileting- Clothing Manipulation and Hygiene: Total assistance         General ADL  Comments: Overall extensive assist for ADLs due to R LE pain, advanced dementia with inability to follow directions to command     Vision Patient Visual Report: No change from baseline Vision Assessment?: No apparent visual deficits     Perception     Praxis      Pertinent Vitals/Pain Pain Assessment Pain Assessment: Faces Faces Pain Scale: Hurts whole lot Pain Location: R LE  with movement Pain Descriptors / Indicators: Crying, Moaning, Guarding, Grimacing Pain Intervention(s): Monitored during session, Limited activity within patient's tolerance, Patient requesting pain meds-RN notified     Hand Dominance Right   Extremity/Trunk Assessment Upper Extremity Assessment Upper Extremity Assessment: Difficult to assess due to impaired cognition   Lower Extremity Assessment Lower Extremity Assessment: Defer to PT evaluation   Cervical / Trunk Assessment Cervical / Trunk Assessment: Kyphotic   Communication Communication Communication: Expressive difficulties;Other (comment) (constant tangential talking)   Cognition Arousal/Alertness: Awake/alert Behavior During Therapy: Restless, Impulsive, Anxious Overall Cognitive Status: History of cognitive impairments - at baseline                                 General Comments: hx of dementia, does not follow one step commands, constant conversation though tangential. reaching out and grasping for staff, family and other items within reach. unable to redirect     General Comments  Daughter and son present, educated on increased physical assist need to gauge if they felt memory care could assist. educated on frequencies of SNF rehab and acute rehab.    Exercises     Shoulder Instructions      Home Living Family/patient expects to be discharged to:: Other (Comment)                                 Additional Comments: from memory care      Prior Functioning/Environment Prior Level of Function : Needs assist  Cognitive Assist : ADLs (cognitive);Mobility (cognitive) Mobility (Cognitive): Intermittent cues ADLs (Cognitive): Intermittent cues       Mobility Comments: typically ambulatory without device; does have a hx of falls          OT Problem List: Decreased strength;Decreased activity tolerance;Decreased cognition;Decreased safety awareness;Decreased knowledge of  precautions;Decreased knowledge of use of DME or AE;Pain      OT Treatment/Interventions: Self-care/ADL training;Therapeutic exercise;Energy conservation;DME and/or AE instruction;Therapeutic activities;Balance training;Patient/family education    OT Goals(Current goals can be found in the care plan section) Acute Rehab OT Goals Patient Stated Goal: family want pt to return to ambulatory status OT Goal Formulation: With family Time For Goal Achievement: 11/15/21 Potential to Achieve Goals: Good  OT Frequency: Min 2X/week    Co-evaluation PT/OT/SLP Co-Evaluation/Treatment: Yes Reason for Co-Treatment: Necessary to address cognition/behavior during functional activity;For patient/therapist safety;To address functional/ADL transfers   OT goals addressed during session: ADL's and self-care      AM-PAC OT "6 Clicks" Daily Activity     Outcome Measure Help from another person eating meals?: A Little Help from another person taking care of personal grooming?: A Lot Help from another person toileting, which includes using toliet, bedpan, or urinal?: Total Help from another person bathing (including washing, rinsing, drying)?: Total Help from another person to put on and taking off regular upper body clothing?: Total Help from another person to put on and taking off regular lower body clothing?: Total 6 Click  Score: 9   End of Session Nurse Communication: Mobility status;Patient requests pain meds  Activity Tolerance: Patient limited by pain;Other (comment) (limited by cognition) Patient left: in bed;with call bell/phone within reach;with bed alarm set;with family/visitor present;with nursing/sitter in room  OT Visit Diagnosis: Unsteadiness on feet (R26.81);Other abnormalities of gait and mobility (R26.89);Muscle weakness (generalized) (M62.81);Other symptoms and signs involving cognitive function                Time: 5784-6962 OT Time Calculation (min): 23 min Charges:  OT General  Charges $OT Visit: 1 Visit OT Evaluation $OT Eval Moderate Complexity: 1 Mod  Bradd Canary, OTR/L Acute Rehab Services Office: [phone]   Lorre Munroe 11/01/2021, 11:22 AM

## 2021-11-01 NOTE — Plan of Care (Signed)
°  Problem: Education: °Goal: Verbalization of understanding the information provided (i.e., activity precautions, restrictions, etc) will improve °Outcome: Not Progressing °Goal: Individualized Educational Video(s) °Outcome: Not Progressing °  °Problem: Activity: °Goal: Ability to ambulate and perform ADLs will improve °Outcome: Not Progressing °  °Problem: Clinical Measurements: °Goal: Postoperative complications will be avoided or minimized °Outcome: Not Progressing °  °Problem: Self-Concept: °Goal: Ability to maintain and perform role responsibilities to the fullest extent possible will improve °Outcome: Not Progressing °  °Problem: Pain Management: °Goal: Pain level will decrease °Outcome: Not Progressing °  °Problem: Education: °Goal: Knowledge of General Education information will improve °Description: Including pain rating scale, medication(s)/side effects and non-pharmacologic comfort measures °Outcome: Not Progressing °  °Problem: Health Behavior/Discharge Planning: °Goal: Ability to manage health-related needs will improve °Outcome: Not Progressing °  °Problem: Clinical Measurements: °Goal: Ability to maintain clinical measurements within normal limits will improve °Outcome: Not Progressing °Goal: Will remain free from infection °Outcome: Not Progressing °Goal: Diagnostic test results will improve °Outcome: Not Progressing °Goal: Respiratory complications will improve °Outcome: Not Progressing °Goal: Cardiovascular complication will be avoided °Outcome: Not Progressing °  °Problem: Activity: °Goal: Risk for activity intolerance will decrease °Outcome: Not Progressing °  °Problem: Nutrition: °Goal: Adequate nutrition will be maintained °Outcome: Not Progressing °  °Problem: Coping: °Goal: Level of anxiety will decrease °Outcome: Not Progressing °  °Problem: Elimination: °Goal: Will not experience complications related to bowel motility °Outcome: Not Progressing °Goal: Will not experience complications  related to urinary retention °Outcome: Not Progressing °  °Problem: Pain Managment: °Goal: General experience of comfort will improve °Outcome: Not Progressing °  °Problem: Safety: °Goal: Ability to remain free from injury will improve °Outcome: Not Progressing °  °Problem: Skin Integrity: °Goal: Risk for impaired skin integrity will decrease °Outcome: Not Progressing °  °

## 2021-11-01 NOTE — Progress Notes (Addendum)
HD#1 Subjective:  Overnight Events: NAEON  Patient was seen at bedside during rounds this morning. Daughter states that she woke up in pain; tylenol did not seem to help so she got some oxycodone. She was at [facility] and has lived there since the beginning of COVID. Family is very pleased with this facility. No complaints or concerns at this time.  Patient was pleasantly demented.  Objective:  Vital signs in last 24 hours: Vitals:   10/31/21 1616 10/31/21 2014 10/31/21 2356 11/01/21 0401  BP: (!) 111/59 (!) 148/88 95/68 (!) 105/53  Pulse: 94 100 98 93  Resp: Temp: 98.1 F (36.7 C) 97.8 F (36.6 C)  98.3 F (36.8 C)  TempSrc: Oral Axillary  Axillary  SpO2:  93%  92%  Weight:      Height:       Supplemental O2: Room Air SpO2: 92 % O2 Flow Rate (L/min): 4 L/min   Physical Exam:  Physical Exam Constitutional:      Appearance: Normal appearance.     Comments: Pleasantly demented elderly female resting at bedside  HENT:     Head: Normocephalic and atraumatic.     Mouth/Throat:     Mouth: Mucous membranes are moist.     Pharynx: Oropharynx is clear.  Eyes:     Extraocular Movements: Extraocular movements intact.     Pupils: Pupils are equal, round, and reactive to light.  Cardiovascular:     Rate and Rhythm: Normal rate and regular rhythm.  Musculoskeletal:     Cervical back: Normal range of motion.  Skin:    General: Skin is warm and dry.  Neurological:     General: No focal deficit present.     Mental Status: She is alert. She is disoriented.     Comments: Secondary to alzheimer's dementia    Filed Weights   10/31/21 1125  Weight: 51.3 kg     Intake/Output Summary (Last 24 hours) at 11/01/2021 0658 Last data filed at 11/01/2021 0537 Gross per 24 hour  Intake 1651.34 ml  Output 475 ml  Net 1176.34 ml   Net IO Since Admission: 1,176.34 mL [11/01/21 0658]  Pertinent Labs:    Latest Ref Rng & Units 11/01/2021    1:52 AM  10/31/2021    6:53 AM 05/04/2021   11:18 AM  CBC  WBC 4.0 - 10.5 K/uL 11.0   15.5   7.6    Hemoglobin 12.0 - 15.0 g/dL 9.2   16.1   09.6    Hematocrit 36.0 - 46.0 % 27.1   38.8   40.6    Platelets 150 - 400 K/uL 155   171   293         Latest Ref Rng & Units 10/31/2021    6:53 AM 05/04/2021   11:18 AM 02/16/2021    2:42 AM  CMP  Glucose 70 - 99 mg/dL 045   409   811    BUN 8 - 23 mg/dL Creatinine 0.44 - 1.00 mg/dL 9.14   7.82   9.56    Sodium 135 - 145 mmol/L 135   135   132    Potassium 3.5 - 5.1 mmol/L 3.3   5.3   4.2    Chloride 98 - 111 mmol/L 103   100   95    CO2 22 - 32 mmol/L 21   26  28    Calcium 8.9 - 10.3 mg/dL 8.3   9.3   9.6    Total Protein 6.5 - 8.1 g/dL  7.2     Total Bilirubin 0.3 - 1.2 mg/dL  1.1     Alkaline Phos 38 - 126 U/L  65     AST 15 - 41 U/L  30     ALT 0 - 44 U/L  12       Imaging: DG Chest 1 View  Result Date: 10/31/2021 CLINICAL DATA:  88 year old female with history of injury with right hip pain after a fall. EXAM: CHEST  1 VIEW COMPARISON:  Chest x-ray 05/04/2021. FINDINGS: Lung volumes are low. Ill-defined opacities at the left lung base, similar to the prior study, which may reflect areas of subsegmental atelectasis or chronic scarring. Blunting of the left costophrenic sulcus may suggest chronic pleuroparenchymal scarring or a trace left pleural effusion. No right pleural effusion. No pneumothorax. No evidence of pulmonary edema. Heart size is normal. Upper mediastinal contours are within normal limits. Aortic atherosclerosis. IMPRESSION: 1. Opacities at the left lung base favored to reflect areas of chronic scarring. 2. Aortic atherosclerosis. Electronically Signed   By: Trudie Reed M.D.   On: 10/31/2021 07:38   CT HEAD WO CONTRAST ( )  Result Date: 10/31/2021 CLINICAL DATA:  88 year old female with history of trauma from a fall. Neck trauma. EXAM: CT HEAD WITHOUT CONTRAST CT CERVICAL SPINE WITHOUT CONTRAST TECHNIQUE:  Multidetector CT imaging of the head and cervical spine was performed following the standard protocol without intravenous contrast. Multiplanar CT image reconstructions of the cervical spine were also generated. RADIATION DOSE REDUCTION: This exam was performed according to the departmental dose-optimization program which includes automated exposure control, adjustment of the mA and/or kV according to patient size and/or use of iterative reconstruction technique. COMPARISON:  Head and cervical spine CT 09/15/2020. FINDINGS: CT HEAD FINDINGS Brain: Moderate cerebral and mild cerebellar atrophy. Patchy and confluent areas of decreased attenuation are noted throughout the deep and periventricular white matter of the cerebral hemispheres bilaterally, compatible with chronic microvascular ischemic disease. No evidence of acute infarction, hemorrhage, hydrocephalus, extra-axial collection or mass lesion/mass effect. Vascular: No hyperdense vessel or unexpected calcification. Skull: Normal. Negative for fracture or focal lesion. Sinuses/Orbits: No acute finding. Other: None. CT CERVICAL SPINE FINDINGS Alignment: 3 mm of anterolisthesis of C4 upon C5, similar to the prior study. Alignment is otherwise anatomic. Skull base and vertebrae: New compression fracture of superior endplate of T1 with approximately 15% loss of anterior vertebral body height, but without paravertebral soft tissue swelling to indicate an acute injury. No other acute displaced fracture noted within the cervical spine. Soft tissues and spinal canal: No prevertebral fluid or swelling. No visible canal hematoma. Disc levels: Multilevel degenerative disc disease, most pronounced at C5-C6 and C6-C7. Mild multilevel facet arthropathy. Upper chest: Unremarkable. Other: None. IMPRESSION: 1. No evidence of significant acute traumatic injury to the skull or brain. 2. New but nonacute compression fracture of the superior endplate of T1 with 15% loss of anterior  vertebral body height. 3. Moderate cerebral and mild cerebellar atrophy with chronic microvascular ischemic changes in the cerebral white matter, as above. 4. Multilevel degenerative disc disease and cervical spondylosis, as above. Electronically Signed   By: Trudie Reed M.D.   On: 10/31/2021 07:28   CT CERVICAL SPINE WO CONTRAST  Result Date: 10/31/2021 CLINICAL DATA:  88 year old female with history of trauma from a fall. Neck trauma. EXAM: CT HEAD WITHOUT  CONTRAST CT CERVICAL SPINE WITHOUT CONTRAST TECHNIQUE: Multidetector CT imaging of the head and cervical spine was performed following the standard protocol without intravenous contrast. Multiplanar CT image reconstructions of the cervical spine were also generated. RADIATION DOSE REDUCTION: This exam was performed according to the departmental dose-optimization program which includes automated exposure control, adjustment of the mA and/or kV according to patient size and/or use of iterative reconstruction technique. COMPARISON:  Head and cervical spine CT 09/15/2020. FINDINGS: CT HEAD FINDINGS Brain: Moderate cerebral and mild cerebellar atrophy. Patchy and confluent areas of decreased attenuation are noted throughout the deep and periventricular white matter of the cerebral hemispheres bilaterally, compatible with chronic microvascular ischemic disease. No evidence of acute infarction, hemorrhage, hydrocephalus, extra-axial collection or mass lesion/mass effect. Vascular: No hyperdense vessel or unexpected calcification. Skull: Normal. Negative for fracture or focal lesion. Sinuses/Orbits: No acute finding. Other: None. CT CERVICAL SPINE FINDINGS Alignment: 3 mm of anterolisthesis of C4 upon C5, similar to the prior study. Alignment is otherwise anatomic. Skull base and vertebrae: New compression fracture of superior endplate of T1 with approximately 15% loss of anterior vertebral body height, but without paravertebral soft tissue swelling to indicate  an acute injury. No other acute displaced fracture noted within the cervical spine. Soft tissues and spinal canal: No prevertebral fluid or swelling. No visible canal hematoma. Disc levels: Multilevel degenerative disc disease, most pronounced at C5-C6 and C6-C7. Mild multilevel facet arthropathy. Upper chest: Unremarkable. Other: None. IMPRESSION: 1. No evidence of significant acute traumatic injury to the skull or brain. 2. New but nonacute compression fracture of the superior endplate of T1 with 15% loss of anterior vertebral body height. 3. Moderate cerebral and mild cerebellar atrophy with chronic microvascular ischemic changes in the cerebral white matter, as above. 4. Multilevel degenerative disc disease and cervical spondylosis, as above. Electronically Signed   By: Trudie Reedaniel  Entrikin M.D.   On: 10/31/2021 07:28   DG C-Arm 1-60 Min-No Report  Result Date: 10/31/2021 Fluoroscopy was utilized by the requesting physician.  No radiographic interpretation.   DG HIP UNILAT WITH PELVIS 2-3 VIEWS RIGHT  Result Date: 10/31/2021 CLINICAL DATA:  88 year old female status post unwitnessed fall. Right hip pain. EXAM: DG HIP (WITH OR WITHOUT PELVIS) 2-3V RIGHT COMPARISON:  Pelvis and hip series 11/29/2018. FINDINGS: Chronic left hip arthroplasty. Chronic left inferior pubic ramus fracture. Pelvis and visible left proximal femur appears stable. Impacted right femur intertrochanteric fracture, mildly comminuted. Right femoral head remains normally located. Calcified right femoral artery atherosclerosis. Negative visible lower abdominal and pelvic visceral contours. IMPRESSION: 1. Right femur intertrochanteric fracture with mild varus impaction. 2. No other No acute osseous abnormality identified. Chronic left hip arthroplasty, left inferior pubic ramus fracture. Electronically Signed   By: Odessa FlemingH  Hall M.D.   On: 10/31/2021 07:39   DG FEMUR, MIN 2 VIEWS RIGHT  Result Date: 10/31/2021 CLINICAL DATA:  ORIF right proximal  femur fracture EXAM: RIGHT FEMUR 2 VIEWS; DG C-ARM 1-60 MIN-NO REPORT COMPARISON:  10/31/2021 right hip radiographs FLUOROSCOPY TIME:  Radiation Exposure Index (if provided by the fluoroscopic device): 13.2 mGy FINDINGS: Multiple spot fluoroscopic nondiagnostic intraoperative right hip radiographs demonstrate transfixation of intertrochanteric right proximal femur fracture with intramedullary rod with interlocking right femoral neck pins and distal interlocking screw in near-anatomic alignment on these views. IMPRESSION: Intraoperative fluoroscopic guidance for ORIF intertrochanteric right proximal femur fracture. Electronically Signed   By: Delbert PhenixJason A Poff M.D.   On: 10/31/2021 14:02    Assessment/Plan:   Principal Problem:   Hip  fracture [facility]) Active Problems:   Closed displaced intertrochanteric fracture of right femur (HCC)   Hyperglycemia   Patient Summary: Heather Floyd is a 88 y.o. with a pertinent PMH of advanced Alzheimer's Dementia and osteoporosis, who presented after a fall at nursing facility and admitted for right femur intertrochanteric fracture.    #Right femur intertrochanteric fracture s/p intramedullary implant WBC 15.5>11.0, Hgb 13.4>9.2, likely reactive to surgery. - orthopedic surgery consulted by ED, appreciate consult. - Diet resumed - Scheduled acetaminophen 1000 mg QID for mild pain, scheduled oxycodone 5 mg q4h for moderate pain, Dilaudid 1 mg q2h for breakthrough pain -Orthopedics recommended weightbearing as tolerated and return in 2 weeks for staple removal. -Patient to be on DVT prophylaxis based on other medication, activity level and risk ratio of bleeding to thrombosis   #Alzheimer Dementia - Continue home regimen for agitation: Sertraline, Quetiapine, Trazodone , atarax - Patient will require sitter at night but family is working on having someone there one-to-one during the daytime.  Diet: Dysphagia 3 IVF: None,None VTE: Enoxaparin Code: DNR PT/OT recs:  Pending, none. TOC recs: pending   Dispo: Anticipated discharge to Nursing Home in 2-3 days pending PT evaluation and sufficient pain control.   Park Pope, MD 11/01/2021, 6:58 AM Pager: [phone]  Please contact the on call pager after 5 pm and on weekends at [phone].

## 2021-11-02 ENCOUNTER — Inpatient Hospital Stay (HOSPITAL_COMMUNITY): Payer: Medicare PPO

## 2021-11-02 DIAGNOSIS — S62101A Fracture of unspecified carpal bone, right wrist, initial encounter for closed fracture: Secondary | ICD-10-CM | POA: Diagnosis not present

## 2021-11-02 DIAGNOSIS — F03911 Unspecified dementia, unspecified severity, with agitation: Secondary | ICD-10-CM | POA: Diagnosis not present

## 2021-11-02 DIAGNOSIS — S72141A Displaced intertrochanteric fracture of right femur, initial encounter for closed fracture: Secondary | ICD-10-CM | POA: Diagnosis not present

## 2021-11-02 LAB — CBC
HCT: 25.3 % — ABNORMAL LOW (ref 36.0–46.0)
Hemoglobin: 8.5 g/dL — ABNORMAL LOW (ref 12.0–15.0)
MCH: 30.7 pg (ref 26.0–34.0)
MCHC: 33.6 g/dL (ref 30.0–36.0)
MCV: 91.3 fL (ref 80.0–100.0)
Platelets: 147 10*3/uL — ABNORMAL LOW (ref 150–400)
RBC: 2.77 MIL/uL — ABNORMAL LOW (ref 3.87–5.11)
RDW: 13.3 % (ref 11.5–15.5)
WBC: 8.9 10*3/uL (ref 4.0–10.5)
nRBC: 0 % (ref 0.0–0.2)

## 2021-11-02 NOTE — Progress Notes (Addendum)
HD#2 Subjective:  Overnight Events: Desatted last night and was placed on [place].   Patient was seen at bedside during rounds this morning. Less talkative and more somnolent today.  Appears to be guarding right arm and unwilling to move it.  Daughter at bedside like to speak with [facility] regarding SNF placement/memory care unit disposition.  Objective:  Vital signs in last 24 hours: Vitals:   11/01/21 0830 11/01/21 1955 11/02/21 0035 11/02/21 0541  BP: (!) 151/95 113/64 120/75 105/85  Pulse: 88 (!) 105 100 100  Resp: 17 16  18   Temp: 98.4 F (36.9 C)  99.1 F (37.3 C) 98.7 F (37.1 C)  TempSrc: Oral  Oral Oral  SpO2: 94% (!) 86% 100% 96%  Weight:      Height:       Supplemental O2: Room Air SpO2: 96 % O2 Flow Rate (L/min): 4 L/min   Physical Exam:  Physical Exam Constitutional:      Appearance: Normal appearance.     Comments: Pleasantly demented elderly female resting at bedside  HENT:     Head: Normocephalic and atraumatic.     Mouth/Throat:     Mouth: Mucous membranes are moist.     Pharynx: Oropharynx is clear.  Eyes:     Extraocular Movements: Extraocular movements intact.     Pupils: Pupils are equal, round, and reactive to light.  Cardiovascular:     Rate and Rhythm: Normal rate and regular rhythm.  Musculoskeletal:     Cervical back: Normal range of motion.  Skin:    General: Skin is warm and dry.  Neurological:     General: No focal deficit present.     Mental Status: She is alert. She is disoriented.     Comments: Secondary to alzheimer's dementia    Filed Weights   10/31/21 1125  Weight: 51.3 kg     Intake/Output Summary (Last 24 hours) at 11/02/2021 0646 Last data filed at 11/01/2021 2000 Gross per 24 hour  Intake 357 ml  Output 300 ml  Net 57 ml    Net IO Since Admission: 1,233.34 mL [11/02/21 0646]  Pertinent Labs:    Latest Ref Rng & Units 11/02/2021    2:04 AM 11/01/2021    1:52 AM 10/31/2021    6:53 AM  CBC  WBC 4.0 - 10.5 K/uL 8.9    11.0   15.5    Hemoglobin 12.0 - 15.0 g/dL 8.5   9.2   11/02/2021    Hematocrit 36.0 - 46.0 % 25.3   27.1   38.8    Platelets 150 - 400 K/uL 147   155   171         Latest Ref Rng & Units 10/31/2021    6:53 AM 05/04/2021   11:18 AM 02/16/2021    2:42 AM  CMP  Glucose 70 - 99 mg/dL 04/18/2021   825   053    BUN 8 - 23 mg/dL 13   14   11     Creatinine 0.44 - 1.00 mg/dL 976     7.34    Sodium 135 - 145 mmol/L 135   135   132    Potassium 3.5 - 5.1 mmol/L 3.3   5.3   4.2    Chloride 98 - 111 mmol/L 103   100   95    CO2 22 - 32 mmol/L 21   26   28     Calcium 8.9 - 10.3 mg/dL 8.3  9.3   9.6    Total Protein 6.5 - 8.1 g/dL  7.2     Total Bilirubin 0.3 - 1.2 mg/dL  1.1     Alkaline Phos 38 - 126 U/L  65     AST 15 - 41 U/L  30     ALT 0 - 44 U/L  12       Imaging: No results found.  Assessment/Plan:   Principal Problem:   Hip fracture (HCC) Active Problems:   Closed displaced intertrochanteric fracture of right femur (HCC)   Hyperglycemia   Patient Summary: Heather Floyd is a 88 y.o. with a pertinent PMH of advanced Alzheimer's Dementia and osteoporosis, who presented after a fall at nursing facility and admitted for right femur intertrochanteric fracture.    #Right femur intertrochanteric fracture s/p intramedullary implant WBC 11.0>8.9 - orthopedic surgery following, appreciate consult. - Oxycodone 10 to 15 mg every 4 hours as needed for severe pain, oxycodone 5 to 10 mg every 4 hours as needed for moderate pain, Tylenol for mild pain -Orthopedics recommended weightbearing as tolerated and return in 2 weeks for staple removal. -Appreciate TOC assistance with placement  #Acute Blood Loss Anemia Hgb 9.2>8.5, likely secondary to hip surgery. -Daily CBC  #Right Wrist Fracture Right wrist showed 2 nondisplaced fractures in distal metaphyseal region of right radius and right 5th metacarpal -Boxer Wrist splint ordered -Will discuss with PT/OT regarding best option for  patient  #Alzheimer Dementia - Continue home regimen for agitation: Sertraline, Quetiapine, Trazodone , atarax - Patient will require sitter at night but family is working on having someone there one-to-one during the daytime.  Diet: Dysphagia 3 IVF: None,None VTE: Enoxaparin Code: DNR PT/OT recs: SNF, none. TOC recs: pending   Dispo: Anticipated discharge to Nursing Home in 2-3 days pending PT evaluation and sufficient pain control.   Park Pope, MD 11/02/2021, 6:46 AM Pager: [phone]  Please contact the on call pager after 5 pm and on weekends at [phone].

## 2021-11-02 NOTE — TOC Progression Note (Addendum)
Transition of Care [facility]) - Progression Note    Patient Details  Name: Heather Floyd MRN: [id number] Date of Birth: [birth year]  Transition of Care [facility]) CM/SW Contact  Lorri Frederick, LCSW Phone Number: 11/02/2021, 10:25 AM  Clinical Narrative:   CSW informed that family now asking about pt returning to [facility] memory care rather than SNF, concerns about losing bed.  CSW spoke with Victorino Dike, RN at [facility], pt will not lose her bed if she goes SNF, they would prefer she does some rehab as their capability for PT would be 2x week at most.  CSW spoke with daughter Heather Floyd in room.  Per Heather Floyd, she spoke to administrator at [facility] who said there could be trouble with pt LTC insurance if she is gone more than 30 days.  Heather Floyd still interested in SNF for period of time less than 30 days, aware that pt will get much more PT at SNF than she would at [facility].  Bed offers presented, she asked for responses from [facility], [facility], and [facility].  CSW reached out to those facilities, awaiting response.   1330: CSW spoke with [facility], they do manage this [employer] policy.   Expected Discharge Plan: Skilled Nursing Facility Barriers to Discharge: Continued Medical Work up, SNF Pending bed offer  Expected Discharge Plan and Services Expected Discharge Plan: Skilled Nursing Facility In-house Referral: Clinical Social Work   Post Acute Care Choice: Skilled Nursing Facility Living arrangements for the past 2 months: Assisted Living Facility ([facility])                                       Social Determinants of Health (SDOH) Interventions    Readmission Risk Interventions     View : No data to display.

## 2021-11-02 NOTE — Progress Notes (Signed)
Orthopedic Tech Progress Note Patient Details:  Heather Floyd 25-Mar-1933 [id number] MD requested that I apply a "boxers splint" to her right hand.  Ortho Devices Type of Ortho Device: Ulna gutter splint Ortho Device/Splint Location: Right hand Ortho Device/Splint Interventions: Application   Post Interventions Patient Tolerated: Well  [name] 11/02/2021, 2:38 PM

## 2021-11-02 NOTE — Progress Notes (Signed)
Pt allowed to sleep after pain meds given. Meds given later than scheduled time.

## 2021-11-02 NOTE — Progress Notes (Signed)
  Subjective: Patient stable.  Pain reasonably well controlled.  Daughter is at the bedside.  Daughter is concerned about right arm movement.   Objective: Vital signs in last 24 hours: Temp:  [98.2 F (36.8 C)-99.1 F (37.3 C)] 98.2 F (36.8 C) (05/20 0733) Pulse Rate:  [88-105] 93 (05/20 0733) Resp:  [16-18] 17 (05/20 0733) BP: (105-151)/(64-95) 118/66 (05/20 0733) SpO2:  [86 %-100 %] 92 % (05/20 0733)  Intake/Output from previous day: 05/19 0701 - 05/20 0700 In: 357 [P.O.:357] Out: 800 [Urine:800] Intake/Output this shift: No intake/output data recorded.  Exam:  Sensation intact distally No cellulitis present  Labs: Recent Labs    10/31/21 0653 11/01/21 0152 11/02/21 0204  HGB 13.4 9.2* 8.5*   Recent Labs    11/01/21 0152 11/02/21 0204  WBC 11.0* 8.9  RBC 3.03* 2.77*  HCT 27.1* 25.3*  PLT 155 147*   Recent Labs    10/31/21 0653  NA 135  K 3.3*  CL 103  CO2 21*  BUN 13  CREATININE 1.14*  GLUCOSE 162*  CALCIUM 8.3*   No results for input(s): LABPT, INR in the last 72 hours.  Assessment/Plan: Impression is expected amount of pain postoperatively from right hip fracture fixation.  Dressings dry.  No cellulitis.  When I do circumduct the right hip the right arm does move appropriately.  She does have pretty reasonable grip strength and muscle tone in that right arm.  Plan at this time is mobilization with physical therapy.  Anticipate skilled nursing/rehab next week   G Dorene Grebe 11/02/2021, 7:56 AM

## 2021-11-03 LAB — CBC
HCT: 26.1 % — ABNORMAL LOW (ref 36.0–46.0)
Hemoglobin: 8.7 g/dL — ABNORMAL LOW (ref 12.0–15.0)
MCH: 30.6 pg (ref 26.0–34.0)
MCHC: 33.3 g/dL (ref 30.0–36.0)
MCV: 91.9 fL (ref 80.0–100.0)
Platelets: 155 10*3/uL (ref 150–400)
RBC: 2.84 MIL/uL — ABNORMAL LOW (ref 3.87–5.11)
RDW: 13.2 % (ref 11.5–15.5)
WBC: 8.4 10*3/uL (ref 4.0–10.5)
nRBC: 0 % (ref 0.0–0.2)

## 2021-11-03 MED ORDER — ACETAMINOPHEN 325 MG PO TABS
650.0000 mg | ORAL_TABLET | Freq: Four times a day (QID) | ORAL | Status: AC
  Administered 2021-11-03 – 2021-11-05 (×7): 650 mg via ORAL
  Filled 2021-11-03 (×7): qty 2

## 2021-11-03 MED ORDER — BISACODYL 10 MG RE SUPP: 10.0000 mg | Freq: Every day | RECTAL | Status: DC | PRN

## 2021-11-03 NOTE — Progress Notes (Signed)
Patient seen this morning.  Has not had therapy this weekend yet. Was noted to have nondisplaced distal radius fracture and base of fifth proximal metacarpal fracture by the primary team.  She has been splinted.  Ulnar gutter splint however is keeping the MCP joints extended which is okay for several days but we would need to get her into a splint that flexes the MCP joints to 90 degrees this week. Discussed with the daughter who thinks that Heather Floyd will likely not tolerate any type of splint on her hand. The distal radius fracture is nondisplaced and should do well with nonoperative treatment Plan to reorder therapy again today.  Anticipate skilled nursing home placement next week

## 2021-11-03 NOTE — Progress Notes (Signed)
HD#3 Subjective:  Overnight Events: Desatted last night and was placed on [place].   Patient was seen at bedside during rounds this morning. Daughter states that her color looks better today. Also states that she is unable to get up and go to the bathroom and is concerned that sitting on a bedpan will be difficult for her after discharge. She has been told that [facility] would only provide therapy 2x a week (?). They want to go with a SNF but are worried that she will lose her spot at [facility]. Daughter states that her memory has gotten worse since when she last participated in therapy, so she is unsure if she could tolerate a SNF now. She will speak with her social worker tomorrow to get more information.   Daughter states that her fingers appear more swollen today and are concerned that the splint is too tight. Unable to tell if she is in pain from this. No other complaints or concerns at this time.   Objective:  Vital signs in last 24 hours: Vitals:   11/02/21 0733 11/02/21 1427 11/02/21 1917 11/03/21 0355  BP: 118/66 138/64 138/60 (!) 131/53  Pulse: 93 86 100 (!) 104  Resp: 17 20 18 18   Temp: 98.2 F (36.8 C) 98.4 F (36.9 C) 98.3 F (36.8 C) 98.2 F (36.8 C)  TempSrc: Oral Axillary Axillary   SpO2: 92% 94% 92% 95%  Weight:      Height:       Supplemental O2: Room Air SpO2: 95 % O2 Flow Rate (L/min): 4 L/min   Physical Exam:  Physical Exam Constitutional:      Appearance: Normal appearance.     Comments: Pleasantly demented elderly female resting at bedside  HENT:     Head: Normocephalic and atraumatic.     Mouth/Throat:     Mouth: Mucous membranes are moist.     Pharynx: Oropharynx is clear.  Eyes:     Extraocular Movements: Extraocular movements intact.     Pupils: Pupils are equal, round, and reactive to light.  Cardiovascular:     Rate and Rhythm: Normal rate and regular rhythm.  Musculoskeletal:     Cervical back: Normal range of motion.     Comments: R  extremity in ace wrap, fingers warm to touch, good cap refill  Skin:    General: Skin is warm and dry.  Neurological:     General: No focal deficit present.     Mental Status: She is alert. She is disoriented.     Comments: Secondary to alzheimer's dementia    Filed Weights   10/31/21 1125  Weight: 51.3 kg     Intake/Output Summary (Last 24 hours) at 11/03/2021 0821 Last data filed at 11/03/2021 0500 Gross per 24 hour  Intake 240 ml  Output 450 ml  Net -210 ml    Net IO Since Admission: 643.34 mL [11/03/21 0821]  Pertinent Labs:    Latest Ref Rng & Units 11/03/2021    1:47 AM 11/02/2021    2:04 AM 11/01/2021    1:52 AM  CBC  WBC 4.0 - 10.5 K/uL 8.4   8.9   11.0    Hemoglobin 12.0 - 15.0 g/dL 8.7   8.5   9.2    Hematocrit 36.0 - 46.0 % 26.1   25.3   27.1    Platelets 150 - 400 K/uL 155   147   155         Latest Ref Rng &  Units 10/31/2021    6:53 AM 05/04/2021   11:18 AM 02/16/2021    2:42 AM  CMP  Glucose 70 - 99 mg/dL 808   811   031    BUN 8 - 23 mg/dL 13   14   11     Creatinine 0.44 - 1.00 mg/dL   5.94   5.85    Sodium 135 - 145 mmol/L 135   135   132    Potassium 3.5 - 5.1 mmol/L 3.3   5.3   4.2    Chloride 98 - 111 mmol/L 103   100   95    CO2 22 - 32 mmol/L 21   26   28     Calcium 8.9 - 10.3 mg/dL 8.3   9.3   9.6    Total Protein 6.5 - 8.1 g/dL  7.2     Total Bilirubin 0.3 - 1.2 mg/dL  1.1     Alkaline Phos 38 - 126 U/L  65     AST 15 - 41 U/L  30     ALT 0 - 44 U/L  12       Imaging: DG Wrist 2 Views Right  Result Date: 11/02/2021 CLINICAL DATA:  Trauma, pain EXAM: RIGHT WRIST - 2 VIEW COMPARISON:  None Available. FINDINGS: Comminuted fracture is seen in the distal metaphyseal region of radius. There is buckling of dorsal cortical margin at the fracture site. There is undisplaced fracture in the base of right fifth metacarpal. Degenerative changes are noted in the radiocarpal intercarpal and intercarpal joints. Degenerative changes are noted in first  carpometacarpal joint. IMPRESSION: Comminuted, essentially undisplaced fracture is seen in the distal metaphyseal region of right radius. There is comminuted undisplaced fracture in the base of right fifth metacarpal. Degenerative changes are noted in multiple joints as described in the body of the report. Electronically Signed   By: M.D.   On: 11/02/2021 11:34   DG Shoulder Right Port  Result Date: 11/02/2021 CLINICAL DATA:  Right shoulder pain. EXAM: RIGHT SHOULDER - 2 VIEW COMPARISON:  None Available. FINDINGS: There is no evidence of fracture or dislocation. Minimal degenerative spurring is seen involving the acromioclavicular and glenohumeral joints. High-riding humeral head noted, consistent with chronic rotator cuff atrophy or tear. IMPRESSION: No acute findings. Minimal acromioclavicular and glenohumeral degenerative spurring. Chronic rotator cuff atrophy or tear. Electronically Signed   By: 11/04/2021 M.D.   On: 11/02/2021 11:33    Assessment/Plan:   Principal Problem:   Hip fracture (HCC) Active Problems:   Closed displaced intertrochanteric fracture of right femur (HCC)   Hyperglycemia   Closed fracture of right wrist   Patient Summary: Heather Floyd is a 88 y.o. with a pertinent PMH of advanced Alzheimer's Dementia and osteoporosis, who presented after a fall at nursing facility and admitted for right femur intertrochanteric fracture.   #Right femur intertrochanteric fracture s/p intramedullary nail 5/18 - Orthopedic surgery is following, appreciate consult. - Oxycodone 10 to 15 mg every 4 hours as needed for severe pain, oxycodone 5 to 10 mg every 4 hours as needed for moderate pain -Will schedule tylenol for 2 d -Orthopedics recommended weightbearing as tolerated and return to clinic in 2 weeks for staple removal. -Appreciate TOC assistance with placement  #Acute Blood Loss Anemia Hgb stable, acute drop likely secondary to hip surgery. -Daily  CBC  #Right Wrist Fracture Right wrist showed 2 nondisplaced fractures in distal metaphyseal region of right radius and  right 5th metacarpal -Ulna gutter splint  -Will f/u w/ ortho regarding further recommendations for this as well as let PT/OT know  #Alzheimer Dementia - Continue home regimen for agitation: Sertraline, Quetiapine, Trazodone  -Per patient's daughter, she is more drowsy this AM, will d/c the scheduled atrax  - Patient will require sitter at night but family is working on having someone there one-to-one during the daytime.  Diet: Dysphagia 3 IVF: None,None VTE: Enoxaparin Code: DNR PT/OT recs: SNF, none. TOC recs: pending   Dispo: Anticipated discharge to Nursing Home in 2-3 days pending PT evaluation and sufficient pain control.   Marolyn Hallerarter, Macaria Bias, MD 11/03/2021, 8:21 AM Pager: [phone]  Please contact the on call pager after 5 pm and on weekends at [phone].

## 2021-11-04 DIAGNOSIS — F03911 Unspecified dementia, unspecified severity, with agitation: Secondary | ICD-10-CM | POA: Diagnosis not present

## 2021-11-04 DIAGNOSIS — S62101A Fracture of unspecified carpal bone, right wrist, initial encounter for closed fracture: Secondary | ICD-10-CM | POA: Diagnosis not present

## 2021-11-04 DIAGNOSIS — S72141A Displaced intertrochanteric fracture of right femur, initial encounter for closed fracture: Secondary | ICD-10-CM | POA: Diagnosis not present

## 2021-11-04 LAB — CBC
HCT: 25 % — ABNORMAL LOW (ref 36.0–46.0)
Hemoglobin: 8.6 g/dL — ABNORMAL LOW (ref 12.0–15.0)
MCH: 31.2 pg (ref 26.0–34.0)
MCHC: 34.4 g/dL (ref 30.0–36.0)
MCV: 90.6 fL (ref 80.0–100.0)
Platelets: 170 10*3/uL (ref 150–400)
RBC: 2.76 MIL/uL — ABNORMAL LOW (ref 3.87–5.11)
RDW: 13.1 % (ref 11.5–15.5)
WBC: 6.4 10*3/uL (ref 4.0–10.5)
nRBC: 0 % (ref 0.0–0.2)

## 2021-11-04 NOTE — Consult Note (Signed)
Consultation Note Date: 11/04/2021   Patient Name: Heather Floyd  DOB: 1932/12/09  MRN: [id number]  Age / Sex: 88 y.o., female  PCP: [facility] Referring Physician: Campbell Riches, MD  Reason for Consultation: Establishing goals of care  HPI/Patient Profile: 88 y.o. female  with past medical history of advanced alzheimer's dementia, hypertension, hypothyroidism, anxiety/depression, GERD and osteoporosis  admitted on 10/31/2021 with fall and fractured right hip.   Patient is status post right ORIF.  PMT has been consulted to assist with goals of care conversation.  Clinical Assessment and Goals of Care:  I have reviewed medical records including EPIC notes, labs and imaging, assessed the patient and then met at the bedside with daughter Randall Hiss, had a phone conversation with patient's daughter [name] to discuss diagnosis prognosis, [place], EOL wishes, disposition and options.  I introduced Palliative Medicine as specialized medical care for people living with serious illness. It focuses on providing relief from the symptoms and stress of a serious illness. The goal is to improve quality of life for both the patient and the family.  We discussed a brief life review of the patient and then focused on their current illness.  The natural disease trajectory and expectations at EOL were discussed.   I attempted to elicit values and goals of care important to the patient.    Medical History Review and Understanding:  Discussed patient's overall decline, advanced dementia, previous history of hip replacement.  Social History: Patient resides at a memory care facility.  She has 2 daughters and a son.  Functional and Nutritional State: Patient's daughter Randall Hiss shares that patient has been struggling, usually has not eaten much but lately her appetite is even further decreased.   Advance  Directives: A detailed discussion regarding advanced directives was had.  Daughter [name] is POA.  Code Status: Concepts specific to code status, artifical feeding and hydration, and rehospitalization were considered and discussed.   Discussion: Patient's daughter [name] shared her confusion with palliative care support and initially thought this was a location in the hospital that she could receive therapy rather than at [facility].  At this time goals are clear for rehabilitation and continued medical interventions, but disposition is complicated by lack of facilities willing to accept the patient.  Daughter Starlett feels that CIR would be too intense for patient. She also feels there is some chance for her to recover functioning to continue enjoying games and social interactions at memory care.  She notes that her quality of life is not ideal but they are not ready for patient to be at end-of-life given she is still conversing with family.  Discussed differences between physical therapy and palliative care with [facility].  Discussed the importance of ongoing goals of care conversations based on patient's progress or lack thereof. Hospice may become more appropriate if she is suffering from pain without reasonable benefit from medical interventions.    The difference between aggressive medical intervention and comfort care was considered in light of the patient's goals of care. Hospice and Palliative Care services outpatient were explained and offered.   Discussed the importance of continued conversation with family and the medical providers regarding overall plan of care and treatment options, ensuring decisions are within the context of the patient's values and GOCs.   Questions and concerns were addressed.  Hard Choices booklet left for review. The family was encouraged to call with questions or concerns.  PMT will continue to support holistically.     SUMMARY OF  RECOMMENDATIONS   -DNR confirmed -Goals  are clear for SNF with hope of patient regaining some functioning and returning to memory care facility -Family will discuss whether interested in a palliative care follow up at facility -PMT remains available for acute needs. Should urgent needs arise please secure chat or call team line  Prognosis:  Unable to determine  Discharge Planning: [facility] for rehab with Palliative care service follow-up      Primary Diagnoses: Present on Admission:  Hip fracture [facility])   Physical Exam Vitals and nursing note reviewed.  Constitutional:      General: She is not in acute distress.    Appearance: She is ill-appearing.     Interventions: Nasal cannula in place.     Comments: Sitting on EOB with PT  Cardiovascular:     Rate and Rhythm: Normal rate.  Pulmonary:     Effort: Pulmonary effort is normal.  Neurological:     Mental Status: She is alert. Mental status is at baseline.    Vital Signs: BP 130/65 (BP Location: Left Arm)   Pulse 98   Temp 99.8 F (37.7 C) (Oral)   Resp 17   Ht 5' 1" (1.549 m)   Wt 51.3 kg   SpO2 97%   BMI 21.35 kg/m  Pain Scale: Faces POSS *See Group Information*: 1-Acceptable,Awake and alert Pain Score: Asleep   SpO2: SpO2: 97 % O2 Device:SpO2: 97 % O2 Flow Rate: .O2 Flow Rate (L/min): 4 L/min   Palliative Assessment/Data:     MDM: High    Johnnette Litter, PA-C  Palliative Medicine Team Team phone # [phone]  Thank you for allowing the Palliative Medicine Team to assist in the care of this patient. Please utilize secure chat with additional questions, if there is no response within 30 minutes please call the above phone number.  Palliative Medicine Team providers are available by phone from 7am to 7pm daily and can be reached through the team cell phone.  Should this patient require assistance outside of these hours, please call the patient's attending physician.

## 2021-11-04 NOTE — Plan of Care (Signed)
  Problem: Clinical Measurements: Goal: Postoperative complications will be avoided or minimized Outcome: Progressing   Problem: Pain Management: Goal: Pain level will decrease Outcome: Progressing   Problem: Nutrition: Goal: Adequate nutrition will be maintained Outcome: Progressing   Problem: Coping: Goal: Level of anxiety will decrease Outcome: Progressing   Problem: Elimination: Goal: Will not experience complications related to bowel motility Outcome: Progressing   Problem: Elimination: Goal: Will not experience complications related to urinary retention Outcome: Progressing   Problem: Pain Managment: Goal: General experience of comfort will improve Outcome: Progressing   Problem: Safety: Goal: Ability to remain free from injury will improve Outcome: Progressing

## 2021-11-04 NOTE — Progress Notes (Signed)
HD#4 Subjective:  Overnight Events: VSS, NAEON  Patient was seen at bedside during rounds this morning. Discussed with family regarding palliative care consult to discuss [place]. Patient's daughter was amenable to this as patient had apparently been on hospice in the past.  Objective:  Vital signs in last 24 hours: Vitals:   11/03/21 1347 11/03/21 1927 11/04/21 0532 11/04/21 0900  BP: (!) 103/58 112/67 125/71 (!) 131/54  Pulse: 93 67 (!) 107 92  Resp: 18 18 18 18   Temp: 97.8 F (36.6 C) 98.5 F (36.9 C) 98.8 F (37.1 C)   TempSrc: Axillary Oral Oral   SpO2:  91% 93% 99%  Weight:      Height:       Supplemental O2: Room Air   Physical Exam:  Physical Exam Constitutional:      Appearance: Normal appearance.     Comments: Pleasantly demented elderly female resting at bedside  HENT:     Head: Normocephalic and atraumatic.     Mouth/Throat:     Mouth: Mucous membranes are moist.     Pharynx: Oropharynx is clear.  Eyes:     Extraocular Movements: Extraocular movements intact.     Pupils: Pupils are equal, round, and reactive to light.  Cardiovascular:     Rate and Rhythm: Normal rate and regular rhythm.  Musculoskeletal:     Cervical back: Normal range of motion.     Comments: R extremity in ace wrap, fingers warm to touch, good cap refill  Skin:    General: Skin is warm and dry.  Neurological:     General: No focal deficit present.     Mental Status: She is alert. She is disoriented.     Comments: Secondary to alzheimer's dementia    Filed Weights   10/31/21 1125  Weight: 51.3 kg     Intake/Output Summary (Last 24 hours) at 11/04/2021 1123 Last data filed at 11/04/2021 0900 Gross per 24 hour  Intake 560 ml  Output 1300 ml  Net -740 ml   Net IO Since Admission: 83.34 mL [11/04/21 1123]  Pertinent Labs:    Latest Ref Rng & Units 11/04/2021    3:45 AM 11/03/2021    1:47 AM 11/02/2021    2:04 AM  CBC  WBC 4.0 - 10.5 K/uL 6.4   8.4   8.9    Hemoglobin  12.0 - 15.0 g/dL 8.6   8.7   8.5    Hematocrit 36.0 - 46.0 % 25.0   26.1   25.3    Platelets 150 - 400 K/uL 170   155   147         Latest Ref Rng & Units 10/31/2021    6:53 AM 05/04/2021   11:18 AM 02/16/2021    2:42 AM  CMP  Glucose 70 - 99 mg/dL 162   104   105    BUN 8 - 23 mg/dL 13   14   11     Creatinine 0.44 - 1.00 mg/dL 1.14   1.02   1.16    Sodium 135 - 145 mmol/L 135   135   132    Potassium 3.5 - 5.1 mmol/L 3.3   5.3   4.2    Chloride 98 - 111 mmol/L 103   100   95    CO2 22 - 32 mmol/L 21   26   28     Calcium 8.9 - 10.3 mg/dL 8.3   9.3   9.6  Total Protein 6.5 - 8.1 g/dL  7.2     Total Bilirubin 0.3 - 1.2 mg/dL  1.1     Alkaline Phos 38 - 126 U/L  65     AST 15 - 41 U/L  30     ALT 0 - 44 U/L  12       Imaging: No results found.  Assessment/Plan:   Principal Problem:   Hip fracture (HCC) Active Problems:   Closed displaced intertrochanteric fracture of right femur (HCC)   Hyperglycemia   Closed fracture of right wrist   Patient Summary: Heather Floyd is a 88 y.o. with a pertinent PMH of advanced Alzheimer's Dementia and osteoporosis, who presented after a fall at nursing facility and admitted for right femur intertrochanteric fracture.   #Right femur intertrochanteric fracture s/p intramedullary nail 5/18 1 year mortality after hip fracture for patient's age ~20%. Given patient's Alzheimer's disease, would like to discuss [place] - Orthopedic surgery is following, appreciate consult. - Oxycodone 10 to 15 mg every 4 hours as needed for severe pain, oxycodone 5 to 10 mg every 4 hours as needed for moderate pain -Will schedule tylenol for 2 d -Orthopedics recommended weightbearing as tolerated and return to clinic in 2 weeks for staple removal. -Appreciate TOC assistance with placement -Palliative care consult, appreciate recommendations  #Acute Blood Loss Anemia Hgb stable, acute drop likely secondary to hip surgery. -Daily CBC  #Right Wrist Fracture Right  wrist showed 2 nondisplaced fractures in distal metaphyseal region of right radius and right 5th metacarpal -Ulna gutter splint  -PT/OT eval and treat  #Alzheimer Dementia - Continue home regimen for agitation: Sertraline, Quetiapine, Trazodone  -Per patient's daughter, she is more drowsy this AM, will d/c the scheduled atrax  - Patient will require sitter at night but family is working on having someone there one-to-one during the daytime.  Diet: Dysphagia 3 IVF: None,None VTE: Enoxaparin Code: DNR PT/OT recs: SNF, none. TOC recs: pending   Dispo: Anticipated discharge to Nursing Home in 2-3 days pending PT evaluation and sufficient pain control.   France Ravens, MD 11/04/2021, 11:23 AM Pager: [phone]  Please contact the on call pager after 5 pm and on weekends at [phone].

## 2021-11-04 NOTE — TOC Progression Note (Addendum)
Transition of Care [facility]) - Progression Note    Patient Details  Name: Heather Floyd MRN: [id number] Date of Birth: 1932-09-30  [facility] CM/SW Contact  Joanne Chars, LCSW Phone Number: 11/04/2021, 11:15 AM  Clinical Narrative:   CSW spoke with [facility]-they cannot offer bed.  [facility]-they cannot offer bed.  [facility]--she is reviewing  1130: CSW spoke with daughter [name] regarding bed offers.  She spoke with MD during rounds today, he mentioned "palliative floor" at the hospital, daughter under impression that pt could stay at the hospital on palliative floor instead of go to SNF.  1500: CSW clarified with MD, they are ordering palliative consult, still planning for SNF.  CSW spoke with daughter again and conveyed this.  She asked for responses from [facility] and [facility].  She does not want [place], [place], [place], or [facility].    CSW reached out to [place] and [employer].  Expected Discharge Plan: [place] Barriers to Discharge: Continued Medical Work up, SNF Pending bed offer  Expected Discharge Plan and Services Expected Discharge Plan: [place] In-house Referral: Clinical Social Work   Post Acute Care Choice: [place] Living arrangements for the past 2 months: [place] ([facility] memory care)                                       Social Determinants of Health (SDOH) Interventions    Readmission Risk Interventions     View : No data to display.

## 2021-11-04 NOTE — Progress Notes (Signed)
Physical Therapy Treatment Patient Details Name: Heather Floyd MRN: [id number] DOB: [birth year] Today's Date: 11/04/2021   History of Present Illness Pt is 88 y/o female admitted after a fall at her memory care facility resulting in R hip fx. Pt underwent IM nailing of R hip on 5/18. PMH: Alzheimer's disease, CKD, osteoporosis, HTN, TIA    PT Comments    Pt A&O to self only throughout session, often calling out for help (daughter present and stating this as normal), despite this pt pleasant and agreeable to session. After substantially increased time sitting with posterior lean and multimodal cues pt able to static sit without assist and without UE support. Pt agreeable to standing trial requiring total a +2 to achieve. Pt with difficulty following one step commands throughout session and guarding RLE secondary to pain. Educated daughter on neutral positioning and knee in extension on R with daughter verbalizing understanding and observing placement. Current plan remains appropriate to address deficits and maximize functional independence and decrease caregiver burden. Pt continues to benefit from skilled PT services to progress toward functional mobility goals.     Recommendations for follow up therapy are one component of a multi-disciplinary discharge planning process, led by the attending physician.  Recommendations may be updated based on patient status, additional functional criteria and insurance authorization.  Follow Up Recommendations  Skilled nursing-short term rehab (<3 hours/day)     Assistance Recommended at Discharge Frequent or constant Supervision/Assistance  Patient can return home with the following Two people to help with walking and/or transfers;Two people to help with bathing/dressing/bathroom;Direct supervision/assist for medications management;Direct supervision/assist for financial management;Assist for transportation;Help with stairs or ramp for entrance   Equipment  Recommendations  None recommended by PT    Recommendations for Other Services       Precautions / Restrictions Precautions Precautions: Fall Restrictions Weight Bearing Restrictions: Yes RLE Weight Bearing: Weight bearing as tolerated     Mobility  Bed Mobility Overal bed mobility: Needs Assistance Bed Mobility: Supine to Sit, Sit to Supine     Supine to sit: Total assist, +2 for physical assistance, +2 for safety/equipment, HOB elevated Sit to supine: Total assist, +2 for physical assistance, +2 for safety/equipment   General bed mobility comments: unable to follow directions, active resistance against movement with posterior lean    Transfers Overall transfer level: Needs assistance Equipment used: 2 person hand held assist Transfers: Sit to/from Stand Sit to Stand: Total assist, +2 physical assistance           General transfer comment: total assist to come to standing once pt stating she was ready, pt showing some strength through LE to power up, strong L lateral lean away from RLE    Ambulation/Gait               General Gait Details: unable   Stairs             Wheelchair Mobility    Modified Rankin (Stroke Patients Only)       Balance Overall balance assessment: Needs assistance Sitting-balance support: Feet supported Sitting balance-Leahy Scale: Zero Sitting balance - Comments: difficutt to quantify as pt actively extends trunk with sitting against the therapists, with substantaially increased time able to static sit without assist and without UE support Postural control: Posterior lean, Left lateral lean  Cognition Arousal/Alertness: Awake/alert Behavior During Therapy: Restless, Impulsive, Anxious Overall Cognitive Status: History of cognitive impairments - at baseline                                 General Comments: dementia, unable to follow even one step commands,  actively resisting, calling out help throughout session (daughter stating that is normal)        Exercises      General Comments General comments (skin integrity, edema, etc.): pt daughter present and encouraging throughout session      Pertinent Vitals/Pain Pain Assessment Pain Assessment: Faces Faces Pain Scale: Hurts even more Pain Location: R LE with movement Pain Descriptors / Indicators: Guarding, Grimacing, Crying, Aching Pain Intervention(s): Limited activity within patient's tolerance, Monitored during session, Repositioned    Home Living                          Prior Function            PT Goals (current goals can now be found in the care plan section) Acute Rehab PT Goals Patient Stated Goal: none stated PT Goal Formulation: With family Time For Goal Achievement: 11/15/21    Frequency    Min 4X/week      PT Plan      Co-evaluation              AM-PAC PT "6 Clicks" Mobility   Outcome Measure  Help needed turning from your back to your side while in a flat bed without using bedrails?: A Lot Help needed moving from lying on your back to sitting on the side of a flat bed without using bedrails?: Total Help needed moving to and from a bed to a chair (including a wheelchair)?: Total Help needed standing up from a chair using your arms (e.g., wheelchair or bedside chair)?: Total Help needed to walk in hospital room?: Total Help needed climbing 3-5 steps with a railing? : Total 6 Click Score: 7    End of Session Equipment Utilized During Treatment: Gait belt Activity Tolerance: Patient limited by fatigue;Patient limited by pain Patient left: in bed;with call bell/phone within reach;with bed alarm set;with family/visitor present Nurse Communication: Mobility status PT Visit Diagnosis: Muscle weakness (generalized) (M62.81);Pain;Difficulty in walking, not elsewhere classified (R26.2)     Time: 8101-7510 PT Time Calculation (min)  (ACUTE ONLY): 30 min  Charges:  $Therapeutic Activity: 23-37 mins                     Tayten Heber R. PTA Acute Rehabilitation Services Office: [phone]    Catalina Antigua 11/04/2021, 4:17 PM

## 2021-11-04 NOTE — Care Management Important Message (Signed)
Important Message  Patient Details  Name: Heather Floyd MRN: [id number] Date of Birth: 11-25-1932   Medicare Important Message Given:  Yes     Athanasius Kesling Stefan Church 11/04/2021, 3:50 PM

## 2021-11-04 NOTE — Plan of Care (Signed)
  Problem: Education: Goal: Verbalization of understanding the information provided (i.e., activity precautions, restrictions, etc) will improve 11/04/2021 2014 by Dahlia Bailiff, RN Outcome: Not Progressing 11/04/2021 2014 by Dahlia Bailiff, RN Outcome: Not Progressing 11/04/2021 2014 by Dahlia Bailiff, RN Outcome: Not Progressing 11/04/2021 2013 by Dahlia Bailiff, RN Outcome: Progressing Goal: Individualized Educational Video(s) 11/04/2021 2014 by Dahlia Bailiff, RN Outcome: Not Progressing 11/04/2021 2014 by Dahlia Bailiff, RN Outcome: Not Progressing 11/04/2021 2014 by Dahlia Bailiff, RN Outcome: Not Progressing 11/04/2021 2013 by Dahlia Bailiff, RN Outcome: Progressing   Problem: Activity: Goal: Ability to ambulate and perform ADLs will improve 11/04/2021 2014 by Dahlia Bailiff, RN Outcome: Not Progressing 11/04/2021 2014 by Dahlia Bailiff, RN Outcome: Not Progressing 11/04/2021 2014 by Dahlia Bailiff, RN Outcome: Not Progressing 11/04/2021 2013 by Dahlia Bailiff, RN Outcome: Progressing  Patient is confused

## 2021-11-05 DIAGNOSIS — S72141D Displaced intertrochanteric fracture of right femur, subsequent encounter for closed fracture with routine healing: Secondary | ICD-10-CM

## 2021-11-05 DIAGNOSIS — S62101D Fracture of unspecified carpal bone, right wrist, subsequent encounter for fracture with routine healing: Secondary | ICD-10-CM

## 2021-11-05 DIAGNOSIS — F03911 Unspecified dementia, unspecified severity, with agitation: Secondary | ICD-10-CM | POA: Diagnosis not present

## 2021-11-05 NOTE — TOC Progression Note (Addendum)
Transition of Care [facility]) - Progression Note    Patient Details  Name: Heather Floyd MRN: [id number] Date of Birth: Jul 12, 1932  [facility] CM/SW Contact  Joanne Chars, LCSW Phone Number: 11/05/2021, 9:48 AM  Clinical Narrative:   [facility]. Daughter [name] informed, will move forward with [employer] but would like to speak with Juliann Pulse.  CSW asked Juliann Pulse to call daughter.   1100: Pt daughter spoke with Juliann Pulse, would like to move forward.  CSW confirmed with Juliann Pulse that family can have sitter with pt overnight if they want to provide this.    Auth request submitted in [place].  1415Josem Kaufmann approved: [id number], [id number], 3 days: 5/23-5/25.  MD informed.  Confirmed with [facility] that they can accept pt today.   CSW spoke with daughter [name], she is asking for DC tomorrow, spoke to MD and he agreed, plan for DC tomorrow.   Expected Discharge Plan: [place] Barriers to Discharge: Continued Medical Work up, SNF Pending bed offer  Expected Discharge Plan and Services Expected Discharge Plan: [place] In-house Referral: Clinical Social Work   Post Acute Care Choice: [place] Living arrangements for the past 2 months: [place] ([facility] memory care)                                       Social Determinants of Health (SDOH) Interventions    Readmission Risk Interventions     View : No data to display.

## 2021-11-05 NOTE — Progress Notes (Signed)
Occupational Therapy Treatment Patient Details Name: Heather Floyd MRN: [id number] DOB: 1933-03-24 Today's Date: 11/05/2021   History of present illness Pt is 88 y/o female admitted after a fall at her memory care facility resulting in R hip fx. Pt underwent IM nailing of R hip on 5/18. PMH: Alzheimer's disease, CKD, osteoporosis, HTN, TIA   OT comments  Pt more alert today with more appropriate responses during session (indicating where pain was, expressing needs of being cold/hungry). However, pt still limited by R LE pain and dementia impacting ability to command follow consistently. Pt requires Total A x 2 for bed mobility with heavy posterior lean initially progressing min guard with prolonged static sitting EOB. Attempted standing with Total A x 2 and pt actively avoiding placing weight through RLE. Pt's daughter present and engaged in tending to pt needs and calming strategies. Encouraged continued presentation of familiar tasks and facilitation of basic one step command following outside of therapy sessions.    Recommendations for follow up therapy are one component of a multi-disciplinary discharge planning process, led by the attending physician.  Recommendations may be updated based on patient status, additional functional criteria and insurance authorization.    Follow Up Recommendations  Skilled nursing-short term rehab (<3 hours/day) (unless memory care able to provide the physical assist pt currently requires)    Assistance Recommended at Discharge Frequent or constant Supervision/Assistance  Patient can return home with the following  Two people to help with walking and/or transfers;Two people to help with bathing/dressing/bathroom   Equipment Recommendations  None recommended by OT    Recommendations for Other Services      Precautions / Restrictions Precautions Precautions: Fall Restrictions Weight Bearing Restrictions: Yes RLE Weight Bearing: Weight bearing as  tolerated Other Position/Activity Restrictions: noted new wrist fx found, no formal orders for R wrist WB; treated conservatively       Mobility Bed Mobility Overal bed mobility: Needs Assistance Bed Mobility: Supine to Sit, Sit to Supine     Supine to sit: Total assist, +2 for physical assistance, +2 for safety/equipment, HOB elevated Sit to supine: Total assist, +2 for physical assistance, +2 for safety/equipment   General bed mobility comments: able to follow simple one step directions with increased time to reach for bed rail but unable to pull to come to sitting or initate movement of BLE    Transfers Overall transfer level: Needs assistance Equipment used: 2 person hand held assist Transfers: Sit to/from Stand Sit to Stand: Total assist, +2 physical assistance           General transfer comment: total assist to come to standing once pt stating she was ready, pt showing some strength through LE to power up, strong L lateral lean away from RLE     Balance Overall balance assessment: Needs assistance Sitting-balance support: Feet supported Sitting balance-Leahy Scale: Poor Sitting balance - Comments: difficutt to quantify as pt actively extends trunk with sitting against the therapists secondary to pain, with substantaially increased time able bring turnk forward and to static sit without assist and without UE support Postural control: Posterior lean, Left lateral lean Standing balance support: During functional activity Standing balance-Leahy Scale: Zero                             ADL either performed or assessed with clinical judgement   ADL Overall ADL's : Needs assistance/impaired Eating/Feeding: Maximal assistance;Sitting Eating/Feeding Details (indicate cue type and reason): attempting  to bite at muffin that daughter had in hand, would not follow directions to take muffin and attempt to eat without assist                 Lower Body Dressing:  Total assistance                 General ADL Comments: Significantly limited by dementia, inability to consistently follow one step commands but able to progress sitting balance and appropriate responses today.    Extremity/Trunk Assessment Upper Extremity Assessment Upper Extremity Assessment: Difficult to assess due to impaired cognition;RUE deficits/detail RUE Deficits / Details: noted new reports of 2 nondisplaced fractures in distal metaphyseal region of right radius and right 5th metacarpal; ulnar gutter splint   Lower Extremity Assessment Lower Extremity Assessment: Defer to PT evaluation        Vision   Vision Assessment?: No apparent visual deficits   Perception     Praxis      Cognition Arousal/Alertness: Awake/alert Behavior During Therapy: Restless, Impulsive, Anxious Overall Cognitive Status: History of cognitive impairments - at baseline                                 General Comments: dementia, difficulty following one step commands, actively resisting, calling out help throughout session (daughter stating that is normal). did show some improvements in appropriate responses and expressing needs (thirsty, wanted to eat, etc)        Exercises      Shoulder Instructions       General Comments Pt's daughter present, supportive and eager to assist as needed    Pertinent Vitals/ Pain       Pain Assessment Pain Assessment: Faces Faces Pain Scale: Hurts even more Pain Location: R LE with movement, RUE Pain Descriptors / Indicators: Guarding, Grimacing, Crying, Aching Pain Intervention(s): Limited activity within patient's tolerance, Monitored during session  Home Living                                          Prior Functioning/Environment              Frequency  Min 2X/week        Progress Toward Goals  OT Goals(current goals can now be found in the care plan section)  Progress towards OT goals: OT to  reassess next treatment  Acute Rehab OT Goals Patient Stated Goal: none specifically stated OT Goal Formulation: With family Time For Goal Achievement: 11/15/21 Potential to Achieve Goals: Good ADL Goals Pt Will Perform Eating: with set-up;sitting Pt Will Perform Grooming: with supervision;sitting Pt Will Transfer to Toilet: with mod assist;stand pivot transfer;bedside commode Additional ADL Goal #1: Pt to follow one step commands > 50% of the time during familiar daily tasks Additional ADL Goal #2: Pt to complete bed mobility at Min A as ADL precursor  Plan Discharge plan remains appropriate    Co-evaluation    PT/OT/SLP Co-Evaluation/Treatment: Yes Reason for Co-Treatment: Complexity of the patient's impairments (multi-system involvement);Necessary to address cognition/behavior during functional activity;For patient/therapist safety;To address functional/ADL transfers PT goals addressed during session: Mobility/safety with mobility;Balance OT goals addressed during session: ADL's and self-care;Strengthening/ROM      AM-PAC OT "6 Clicks" Daily Activity     Outcome Measure   Help from another person eating meals?: A Lot Help from  another person taking care of personal grooming?: A Lot Help from another person toileting, which includes using toliet, bedpan, or urinal?: Total Help from another person bathing (including washing, rinsing, drying)?: Total Help from another person to put on and taking off regular upper body clothing?: Total Help from another person to put on and taking off regular lower body clothing?: Total 6 Click Score: 8    End of Session Equipment Utilized During Treatment: Gait belt  OT Visit Diagnosis: Unsteadiness on feet (R26.81);Other abnormalities of gait and mobility (R26.89);Muscle weakness (generalized) (M62.81);Other symptoms and signs involving cognitive function   Activity Tolerance Patient limited by pain;Other (comment) (limited by cognition)    Patient Left in bed;with call bell/phone within reach;with family/visitor present   Nurse Communication          Time: [id number] OT Time Calculation (min): 26 min  Charges: OT General Charges $OT Visit: 1 Visit OT Treatments $Therapeutic Activity: 8-22 mins  Bradd CanaryJulie B, OTR/L Acute Rehab Services Office: [phone]   Lorre MunroeJulie  Nymir Ringler 11/05/2021, 12:16 PM

## 2021-11-05 NOTE — Progress Notes (Signed)
Physical Therapy Treatment Patient Details Name: Heather Floyd MRN: [id number] DOB: Mar 02, 1933 Today's Date: 11/05/2021   History of Present Illness Pt is 88 y/o female admitted after a fall at her memory care facility resulting in R hip fx. Pt underwent IM nailing of R hip on 5/18. PMH: Alzheimer's disease, CKD, osteoporosis, HTN, TIA    PT Comments    Pt more alert this session and able to state when she is in pain and location of pain, with less calling out and more appropriate responses during session. Pt daughter present throughout and encouraging and pt pleasant and agreeable to session. Pt needing less time to find anterior/posterior midline sitting EOB this session and able static sit without support and without pushing back into therapist. Pt agreeable to progression of standing trials requiring down to max a +2 to achieve. Pt continues to be hesitant to accept weight on painful RLE and compensate with posterior and/or left lateral lean. Current plan remains appropriate to address deficits and maximize functional independence and decrease caregiver burden. Pt continues to benefit from skilled PT services to progress toward functional mobility goals.    Recommendations for follow up therapy are one component of a multi-disciplinary discharge planning process, led by the attending physician.  Recommendations may be updated based on patient status, additional functional criteria and insurance authorization.  Follow Up Recommendations  Skilled nursing-short term rehab (<3 hours/day)     Assistance Recommended at Discharge Frequent or constant Supervision/Assistance  Patient can return home with the following Two people to help with walking and/or transfers;Two people to help with bathing/dressing/bathroom;Direct supervision/assist for medications management;Direct supervision/assist for financial management;Assist for transportation;Help with stairs or ramp for entrance   Equipment  Recommendations  None recommended by PT    Recommendations for Other Services       Precautions / Restrictions Precautions Precautions: Fall Restrictions Weight Bearing Restrictions: Yes RLE Weight Bearing: Weight bearing as tolerated     Mobility  Bed Mobility Overal bed mobility: Needs Assistance Bed Mobility: Supine to Sit, Sit to Supine     Supine to sit: Total assist, +2 for physical assistance, +2 for safety/equipment, HOB elevated Sit to supine: Total assist, +2 for physical assistance, +2 for safety/equipment   General bed mobility comments: able to follow simple one step directions with increased time to reach for bed rail but unable to pull to come to sitting or initate movement of BLE    Transfers Overall transfer level: Needs assistance Equipment used: 2 person hand held assist Transfers: Sit to/from Stand Sit to Stand: Max assist, Total assist, +2 physical assistance           General transfer comment: total assist to come to standing once pt stating she was ready, pt showing some strength through LE to power up, strong L lateral lean away from RLE    Ambulation/Gait               General Gait Details: unable   Stairs             Wheelchair Mobility    Modified Rankin (Stroke Patients Only)       Balance Overall balance assessment: Needs assistance Sitting-balance support: Feet supported Sitting balance-Leahy Scale: Zero Sitting balance - Comments: difficutt to quantify as pt actively extends trunk with sitting against the therapists secondary to pain, with substantaially increased time able bring turnk forward and to static sit without assist and without UE support Postural control: Posterior lean, Left lateral lean  Cognition Arousal/Alertness: Awake/alert Behavior During Therapy: Restless, Impulsive, Anxious Overall Cognitive Status: History of cognitive impairments - at  baseline                                 General Comments: dementia, difficulty following one step commands, actively resisting, calling out help throughout session (daughter stating that is normal)        Exercises      General Comments General comments (skin integrity, edema, etc.): pt daughter present and encouraging throughout session      Pertinent Vitals/Pain Pain Assessment Pain Assessment: Faces Faces Pain Scale: Hurts even more Pain Location: R LE with movement, RUE Pain Descriptors / Indicators: Guarding, Grimacing, Crying, Aching Pain Intervention(s): Limited activity within patient's tolerance, Monitored during session, Repositioned    Home Living                          Prior Function            PT Goals (current goals can now be found in the care plan section) Acute Rehab PT Goals Patient Stated Goal: none stated PT Goal Formulation: With family Time For Goal Achievement: 11/15/21    Frequency    Min 4X/week      PT Plan      Co-evaluation PT/OT/SLP Co-Evaluation/Treatment: Yes Reason for Co-Treatment: Complexity of the patient's impairments (multi-system involvement);For patient/therapist safety;To address functional/ADL transfers PT goals addressed during session: Mobility/safety with mobility;Balance        AM-PAC PT "6 Clicks" Mobility   Outcome Measure  Help needed turning from your back to your side while in a flat bed without using bedrails?: A Lot Help needed moving from lying on your back to sitting on the side of a flat bed without using bedrails?: Total Help needed moving to and from a bed to a chair (including a wheelchair)?: Total Help needed standing up from a chair using your arms (e.g., wheelchair or bedside chair)?: Total Help needed to walk in hospital room?: Total Help needed climbing 3-5 steps with a railing? : Total 6 Click Score: 7    End of Session Equipment Utilized During Treatment: Gait  belt Activity Tolerance: Patient limited by pain;Patient tolerated treatment well Patient left: in bed;with call bell/phone within reach;with family/visitor present Nurse Communication: Mobility status PT Visit Diagnosis: Muscle weakness (generalized) (M62.81);Pain;Difficulty in walking, not elsewhere classified (R26.2)     Time: [id number] PT Time Calculation (min) (ACUTE ONLY): 26 min  Charges:  $Therapeutic Activity: 8-22 mins                    Shalandra Leu R. PTA Acute Rehabilitation Services Office: [place] 11/05/2021, 11:55 AM

## 2021-11-05 NOTE — Progress Notes (Addendum)
HD#5 Subjective:  Overnight Events: Tachy 110, satting 86-89% O2, NAEON. Some miscommunication regarding palliative discussion vs palliative resources.   Patient was seen at bedside during rounds this morning. Daughter states that she seems to be doing better today. She did well with PT yesterday. Daughter does not want her to get any lab work moving forward. Daughter still wants to proceed with SNF. No other complaints or concerns at this time.   Objective:  Vital signs in last 24 hours: Vitals:   11/04/21 0900 11/04/21 1421 11/04/21 2210 11/05/21 0405  BP: (!) 131/54 130/65 (!) 115/53 113/66  Pulse: 92 98 (!) 110 (!) 102  Resp: 18 17 20 16   Temp:  99.8 F (37.7 C) 97.8 F (36.6 C) 97.6 F (36.4 C)  TempSrc:  Oral Oral Oral  SpO2: 99% 97% (!) 86% (!) 89%  Weight:      Height:       Supplemental O2: Room Air   Physical Exam:  Physical Exam Constitutional:      Appearance: Normal appearance.     Comments: Pleasantly demented elderly female resting at bedside  HENT:     Head: Normocephalic and atraumatic.     Mouth/Throat:     Mouth: Mucous membranes are moist.     Pharynx: Oropharynx is clear.  Eyes:     Extraocular Movements: Extraocular movements intact.     Pupils: Pupils are equal, round, and reactive to light.  Cardiovascular:     Rate and Rhythm: Normal rate and regular rhythm.  Musculoskeletal:     Cervical back: Normal range of motion.     Comments: R extremity in ace wrap, fingers warm to touch, good cap refill  Skin:    General: Skin is warm and dry.  Neurological:     General: No focal deficit present.     Mental Status: She is alert. She is disoriented.     Comments: Secondary to alzheimer's dementia    Filed Weights   10/31/21 1125  Weight: 51.3 kg     Intake/Output Summary (Last 24 hours) at 11/05/2021 0704 Last data filed at 11/05/2021 0600 Gross per 24 hour  Intake 2580.98 ml  Output 350 ml  Net 2230.98 ml    Net IO Since Admission:  2,074.32 mL [11/05/21 0704]  Pertinent Labs:    Latest Ref Rng & Units 11/04/2021    3:45 AM 11/03/2021    1:47 AM 11/02/2021    2:04 AM  CBC  WBC 4.0 - 10.5 K/uL 6.4   8.4   8.9    Hemoglobin 12.0 - 15.0 g/dL 8.6   8.7   8.5    Hematocrit 36.0 - 46.0 % 25.0   26.1   25.3    Platelets 150 - 400 K/uL 170   155   147         Latest Ref Rng & Units 10/31/2021    6:53 AM 05/04/2021   11:18 AM 02/16/2021    2:42 AM  CMP  Glucose 70 - 99 mg/dL 04/18/2021   948   016    BUN 8 - 23 mg/dL 13   14   11     Creatinine 0.44 - 1.00 mg/dL 553     7.48    Sodium 135 - 145 mmol/L 135   135   132    Potassium 3.5 - 5.1 mmol/L 3.3   5.3   4.2    Chloride 98 - 111 mmol/L 103   100  95    CO2 22 - 32 mmol/L 21   26   28     Calcium 8.9 - 10.3 mg/dL 8.3   9.3   9.6    Total Protein 6.5 - 8.1 g/dL  7.2     Total Bilirubin 0.3 - 1.2 mg/dL  1.1     Alkaline Phos 38 - 126 U/L  65     AST 15 - 41 U/L  30     ALT 0 - 44 U/L  12       Imaging: No results found.  Assessment/Plan:   Principal Problem:   Hip fracture (HCC) Active Problems:   Closed displaced intertrochanteric fracture of right femur (HCC)   Hyperglycemia   Closed fracture of right wrist   Patient Summary: Heather Floyd is a 88 y.o. with a pertinent PMH of advanced Alzheimer's Dementia and osteoporosis, who presented after a fall at nursing facility and admitted for right femur intertrochanteric fracture.   Patient is medically stable for SNF placement  #Right femur intertrochanteric fracture s/p intramedullary nail 5/18 - Orthopedic surgery is following, appreciate consult. - Oxycodone 10 to 15 mg every 4 hours as needed for severe pain, oxycodone 5 to 10 mg every 4 hours as needed for moderate pain -Will schedule tylenol for 2 d -Orthopedics recommended weightbearing as tolerated and return to clinic in 2 weeks for staple removal. -Appreciate TOC assistance with placement, currently awaiting SNF placement -Palliative care  consult, appreciate recommendations  #Acute Blood Loss Anemia, stable Hgb stable, acute drop likely secondary to hip surgery. -Minimize blood draws to reduce agitation  #Right Wrist Fracture Right wrist showed 2 nondisplaced fractures in distal metaphyseal region of right radius and right 5th metacarpal -Ulna gutter splint  -PT/OT eval and treat  #Alzheimer Dementia - Continue home regimen for agitation: Sertraline, Quetiapine, Trazodone  -Per patient's daughter, she is more drowsy this AM, will d/c the scheduled atrax  - Patient will require sitter at night but family is working on having someone there one-to-one during the daytime.  Diet: Dysphagia 3 IVF: None,None VTE: Enoxaparin Code: DNR PT/OT recs: SNF, none. TOC recs: pending   Dispo: Anticipated discharge to Nursing Home in 2-3 days pending PT evaluation and sufficient pain control.   6/18, MD 11/05/2021, 7:04 AM Pager: [phone]  Please contact the on call pager after 5 pm and on weekends at [phone].

## 2021-11-06 DIAGNOSIS — F03911 Unspecified dementia, unspecified severity, with agitation: Secondary | ICD-10-CM | POA: Diagnosis not present

## 2021-11-06 DIAGNOSIS — S72141D Displaced intertrochanteric fracture of right femur, subsequent encounter for closed fracture with routine healing: Secondary | ICD-10-CM | POA: Diagnosis not present

## 2021-11-06 DIAGNOSIS — W19XXXA Unspecified fall, initial encounter: Secondary | ICD-10-CM | POA: Diagnosis not present

## 2021-11-06 MED ORDER — LOPERAMIDE HCL 2 MG PO TABS
2.0000 mg | ORAL_TABLET | ORAL | Status: AC | PRN
Start: 2021-11-06 — End: ?

## 2021-11-06 NOTE — Discharge Summary (Signed)
Name: Heather Floyd MRN: [id number] DOB: 07/09/1932 88 y.o. PCP: [facility]  Date of Admission: 10/31/2021  6:26 AM Date of Discharge: No discharge date for patient encounter. Attending Physician: Ginnie SmartHatcher, Jeffrey C, MD  Discharge Diagnosis: Right Femur intertrochanteric fracture status post intramedullary nail Acute blood loss anemia Right wrist fracture Alzheimer's dementia  Discharge Medications: Allergies as of 11/06/2021       Reactions   Aspirin Other (See Comments)   Makes blood too thin   Codeine Nausea Only        Medication List     STOP taking these medications    traMADol 50 MG tablet Commonly known as: ULTRAM       TAKE these medications    acetaminophen 500 MG tablet Commonly known as: TYLENOL Take 1,000 mg by mouth in the morning and at bedtime. What changed: Another medication with the same name was removed. Continue taking this medication, and follow the directions you see here.   amLODipine 5 MG tablet Commonly known as: NORVASC Take 5 mg by mouth at bedtime.   enoxaparin 40 MG/0.4ML injection Commonly known as: LOVENOX Inject 0.4 mLs (40 mg total) into the skin daily for 14 days.   famotidine 20 MG tablet Commonly known as: PEPCID Take 20 mg by mouth daily.   feeding supplement Liqd Take 237 mLs by mouth 3 (three) times daily between meals.   gabapentin 100 MG capsule Commonly known as: NEURONTIN Take 100 mg by mouth 3 (three) times daily.   HYDROcodone-acetaminophen 5-325 MG tablet Commonly known as: Norco Take 1 tablet by mouth every 6 (six) hours as needed.   hydrOXYzine 25 MG tablet Commonly known as: ATARAX Take 1 tablet (25 mg total) by mouth 3 (three) times daily as needed. What changed: when to take this   loperamide 2 MG tablet Commonly known as: IMODIUM A-D Take 1 tablet (2 mg total) by mouth every 4 (four) hours as needed for diarrhea or loose stools.   polyethylene glycol 17 g packet Commonly  known as: MiraLax Take 17 g by mouth daily as needed for moderate constipation. What changed: when to take this   QUEtiapine 25 MG tablet Commonly known as: SEROQUEL Take 12.5 mg by mouth 2 (two) times daily.   senna-docusate 8.6-50 MG tablet Commonly known as: Senokot-S Take 3 tablets by mouth 2 (two) times daily. What changed:  how much to take when to take this   sertraline 100 MG tablet Commonly known as: ZOLOFT Take 1 tablet (100 mg total) by mouth daily.   traZODone 50 MG tablet Commonly known as: DESYREL Take 1.5 tablets (75 mg total) by mouth at bedtime.   Vitamin D (Ergocalciferol) 1.25 MG (50000 UNIT) Caps capsule Commonly known as: DRISDOL Take 50,000 Units by mouth every Tuesday.               Discharge Care Instructions  (From admission, onward)           Start     Ordered   11/06/21 0000  Discharge wound care:       Comments: Reinforce dressing   11/06/21 0821   10/31/21 0000  Weight bearing as tolerated        10/31/21 1409            Disposition and follow-up:   Ms.Raneisha M Virrueta [facility] St[facility].  At the hospital follow up visit please address:  1.  None  2.  Labs / imaging needed at time of follow-up: CBC for acute blood loss anemia  3.  Pending labs/ test needing follow-up: none  Follow-up Appointments:  Follow-up Information     Tarry Kos, MD Follow up in 2 week(s).   Specialty: Orthopedic Surgery Why: For wound re-check, For suture removal Contact information: [address] [place] [address] [phone]                 Hospital Course by problem list: Heather Floyd is a 88 y.o. with a pertinent PMH of advanced Alzheimer's Dementia and osteoporosis, who presented after a fall at nursing facility and admitted for right femur intertrochanteric fracture. Her hospital course is detailed below:  #Right femur intertrochanteric fracture status post  intramedullary nail 5/18 Patient was admitted due to having a fall leading to a right hip fracture.  Orthopedic surgery was consulted and an intramedullary nail was placed.  Patient was immediately started on physical therapy and pain was appropriately managed.  Orthopedics recommended weightbearing as tolerated and return to clinic in 2 weeks for staple removal.  Physical therapy recommended skilled nursing facility in order to improve mobility.  #Acute Blood Loss Anemia, stable Patient's hemoglobin on admission was 12 but dropped to 8 after procedure.  This is likely reactive to procedure.  Hemoglobin has been unchanged at 8 for several days.  #Right Wrist Fracture Patient's right wrist x-ray showed 2 nondisplaced fractures and distal metaphyseal region of right radius and right fifth metacarpal.  Ulnar gutter splint was placed and physical therapy was made aware.   Other chronic conditions were medically managed with home medications and formulary alternatives as necessary (Alzheimer's disease)  Discharge Exam:   BP (!) 147/68 (BP Location: Left Arm)   Pulse 87   Temp 97.9 F (36.6 C) (Axillary)   Resp 19   Ht  (1.549 m)   Wt 51.3 kg   SpO2 96%   BMI 21.35 kg/m  Discharge exam:  General: NAD, nl appearance HE: Normocephalic, EOMI, Conjunctivae normal, hematoma in back of head is stable and nonerythematous. ENT: No congestion, no rhinorrhea, no exudate or erythema  Cardiovascular: Normal rate, regular rhythm. No murmurs, rubs, or gallops Pulmonary: Effort normal, breath sounds normal. No wheezes, rales, or rhonchi Abdominal: soft, nontender, bowel sounds present Musculoskeletal: no swelling, R extremity in ace wrap, fingers warm to touch, good cap refill, right hip dressed and stapled Skin: Warm, dry, no bruising, erythema, or rash Psychiatric/Behavioral: normal mood, normal behavior     Pertinent Labs, Studies, and Procedures:  DG Chest 1 View  Result Date:  10/31/2021 CLINICAL DATA:  88 year old female with history of injury with right hip pain after a fall. EXAM: CHEST  1 VIEW COMPARISON:  Chest x-ray 05/04/2021. FINDINGS: Lung volumes are low. Ill-defined opacities at the left lung base, similar to the prior study, which may reflect areas of subsegmental atelectasis or chronic scarring. Blunting of the left costophrenic sulcus may suggest chronic pleuroparenchymal scarring or a trace left pleural effusion. No right pleural effusion. No pneumothorax. No evidence of pulmonary edema. Heart size is normal. Upper mediastinal contours are within normal limits. Aortic atherosclerosis. IMPRESSION: 1. Opacities at the left lung base favored to reflect areas of chronic scarring. 2. Aortic atherosclerosis. Electronically Signed   By: Trudie Reed M.D.   On: 10/31/2021 07:38   CT HEAD WO CONTRAST ( )  Result Date: 10/31/2021 CLINICAL DATA:  88 year old female with history of trauma from a fall. Neck trauma. EXAM: CT  HEAD WITHOUT CONTRAST CT CERVICAL SPINE WITHOUT CONTRAST TECHNIQUE: Multidetector CT imaging of the head and cervical spine was performed following the standard protocol without intravenous contrast. Multiplanar CT image reconstructions of the cervical spine were also generated. RADIATION DOSE REDUCTION: This exam was performed according to the departmental dose-optimization program which includes automated exposure control, adjustment of the mA and/or kV according to patient size and/or use of iterative reconstruction technique. COMPARISON:  Head and cervical spine CT 09/15/2020. FINDINGS: CT HEAD FINDINGS Brain: Moderate cerebral and mild cerebellar atrophy. Patchy and confluent areas of decreased attenuation are noted throughout the deep and periventricular white matter of the cerebral hemispheres bilaterally, compatible with chronic microvascular ischemic disease. No evidence of acute infarction, hemorrhage, hydrocephalus, extra-axial collection or mass  lesion/mass effect. Vascular: No hyperdense vessel or unexpected calcification. Skull: Normal. Negative for fracture or focal lesion. Sinuses/Orbits: No acute finding. Other: None. CT CERVICAL SPINE FINDINGS Alignment: 3 mm of anterolisthesis of C4 upon C5, similar to the prior study. Alignment is otherwise anatomic. Skull base and vertebrae: New compression fracture of superior endplate of T1 with approximately 15% loss of anterior vertebral body height, but without paravertebral soft tissue swelling to indicate an acute injury. No other acute displaced fracture noted within the cervical spine. Soft tissues and spinal canal: No prevertebral fluid or swelling. No visible canal hematoma. Disc levels: Multilevel degenerative disc disease, most pronounced at C5-C6 and C6-C7. Mild multilevel facet arthropathy. Upper chest: Unremarkable. Other: None. IMPRESSION: 1. No evidence of significant acute traumatic injury to the skull or brain. 2. New but nonacute compression fracture of the superior endplate of T1 with 15% loss of anterior vertebral body height. 3. Moderate cerebral and mild cerebellar atrophy with chronic microvascular ischemic changes in the cerebral white matter, as above. 4. Multilevel degenerative disc disease and cervical spondylosis, as above. Electronically Signed   By: Trudie Reed M.D.   On: 10/31/2021 07:28   CT CERVICAL SPINE WO CONTRAST  Result Date: 10/31/2021 CLINICAL DATA:  88 year old female with history of trauma from a fall. Neck trauma. EXAM: CT HEAD WITHOUT CONTRAST CT CERVICAL SPINE WITHOUT CONTRAST TECHNIQUE: Multidetector CT imaging of the head and cervical spine was performed following the standard protocol without intravenous contrast. Multiplanar CT image reconstructions of the cervical spine were also generated. RADIATION DOSE REDUCTION: This exam was performed according to the departmental dose-optimization program which includes automated exposure control, adjustment of the  mA and/or kV according to patient size and/or use of iterative reconstruction technique. COMPARISON:  Head and cervical spine CT 09/15/2020. FINDINGS: CT HEAD FINDINGS Brain: Moderate cerebral and mild cerebellar atrophy. Patchy and confluent areas of decreased attenuation are noted throughout the deep and periventricular white matter of the cerebral hemispheres bilaterally, compatible with chronic microvascular ischemic disease. No evidence of acute infarction, hemorrhage, hydrocephalus, extra-axial collection or mass lesion/mass effect. Vascular: No hyperdense vessel or unexpected calcification. Skull: Normal. Negative for fracture or focal lesion. Sinuses/Orbits: No acute finding. Other: None. CT CERVICAL SPINE FINDINGS Alignment: 3 mm of anterolisthesis of C4 upon C5, similar to the prior study. Alignment is otherwise anatomic. Skull base and vertebrae: New compression fracture of superior endplate of T1 with approximately 15% loss of anterior vertebral body height, but without paravertebral soft tissue swelling to indicate an acute injury. No other acute displaced fracture noted within the cervical spine. Soft tissues and spinal canal: No prevertebral fluid or swelling. No visible canal hematoma. Disc levels: Multilevel degenerative disc disease, most pronounced at C5-C6 and C6-C7. Mild  multilevel facet arthropathy. Upper chest: Unremarkable. Other: None. IMPRESSION: 1. No evidence of significant acute traumatic injury to the skull or brain. 2. New but nonacute compression fracture of the superior endplate of T1 with 15% loss of anterior vertebral body height. 3. Moderate cerebral and mild cerebellar atrophy with chronic microvascular ischemic changes in the cerebral white matter, as above. 4. Multilevel degenerative disc disease and cervical spondylosis, as above. Electronically Signed   By: Trudie Reed M.D.   On: 10/31/2021 07:28   DG C-Arm 1-60 Min-No Report  Result Date: 10/31/2021 Fluoroscopy was  utilized by the requesting physician.  No radiographic interpretation.   DG HIP UNILAT WITH PELVIS 2-3 VIEWS RIGHT  Result Date: 10/31/2021 CLINICAL DATA:  88 year old female status post unwitnessed fall. Right hip pain. EXAM: DG HIP (WITH OR WITHOUT PELVIS) 2-3V RIGHT COMPARISON:  Pelvis and hip series 11/29/2018. FINDINGS: Chronic left hip arthroplasty. Chronic left inferior pubic ramus fracture. Pelvis and visible left proximal femur appears stable. Impacted right femur intertrochanteric fracture, mildly comminuted. Right femoral head remains normally located. Calcified right femoral artery atherosclerosis. Negative visible lower abdominal and pelvic visceral contours. IMPRESSION: 1. Right femur intertrochanteric fracture with mild varus impaction. 2. No other No acute osseous abnormality identified. Chronic left hip arthroplasty, left inferior pubic ramus fracture. Electronically Signed   By: Odessa Fleming M.D.   On: 10/31/2021 07:39   DG FEMUR, MIN 2 VIEWS RIGHT  Result Date: 10/31/2021 CLINICAL DATA:  ORIF right proximal femur fracture EXAM: RIGHT FEMUR 2 VIEWS; DG C-ARM 1-60 MIN-NO REPORT COMPARISON:  10/31/2021 right hip radiographs FLUOROSCOPY TIME:  Radiation Exposure Index (if provided by the fluoroscopic device): 13.2 mGy FINDINGS: Multiple spot fluoroscopic nondiagnostic intraoperative right hip radiographs demonstrate transfixation of intertrochanteric right proximal femur fracture with intramedullary rod with interlocking right femoral neck pins and distal interlocking screw in near-anatomic alignment on these views. IMPRESSION: Intraoperative fluoroscopic guidance for ORIF intertrochanteric right proximal femur fracture. Electronically Signed   By: Delbert Phenix M.D.   On: 10/31/2021 14:02       Latest Ref Rng & Units 11/04/2021    3:45 AM 11/03/2021    1:47 AM 11/02/2021    2:04 AM  CBC  WBC 4.0 - 10.5 K/uL 6.4   8.4   8.9    Hemoglobin 12.0 - 15.0 g/dL 8.6   8.7   8.5    Hematocrit 36.0 -  46.0 % 25.0   26.1   25.3    Platelets 150 - 400 K/uL 170   155   147        Latest Ref Rng & Units 10/31/2021    6:53 AM 05/04/2021   11:18 AM 02/16/2021    2:42 AM  BMP  Glucose 70 - 99 mg/dL 782   956   213    BUN 8 - 23 mg/dL 13   14   11     Creatinine 0.44 - 1.00 mg/dL   0.86   5.78    Sodium 135 - 145 mmol/L 135   135   132    Potassium 3.5 - 5.1 mmol/L 3.3   5.3   4.2    Chloride 98 - 111 mmol/L 103   100   95    CO2 22 - 32 mmol/L 21   26   28     Calcium 8.9 - 10.3 mg/dL 8.3   9.3   9.6       Discharge Instructions: Discharge Instructions  Call MD for:  difficulty breathing, headache or visual disturbances   Complete by: As directed    Call MD for:  hives   Complete by: As directed    Call MD for:  persistant nausea and vomiting   Complete by: As directed    Call MD for:  redness, tenderness, or signs of infection (pain, swelling, redness, odor or green/yellow discharge around incision site)   Complete by: As directed    Call MD for:  severe uncontrolled pain   Complete by: As directed    Call MD for:  temperature >100.4   Complete by: As directed    Diet - low sodium heart healthy   Complete by: As directed    Discharge instructions   Complete by: As directed    Dear Ms. Vanderhoof,  It was a pleasure taking care of you while you are in the hospital.  You were admitted due to hip fracture as well as wrist fracture.  You will be transferred to SNF to improve your strength. Please take your medications as prescribed. You will need to follow up with orthopedics for staple removal.  Take care! -Redge Gainer IMTS   Discharge wound care:   Complete by: As directed    Reinforce dressing   Increase activity slowly   Complete by: As directed    Weight bearing as tolerated   Complete by: As directed        Signed: Park Pope, MD 11/06/2021, 11:18 AM   Pager: [phone]

## 2021-11-06 NOTE — TOC Transition Note (Signed)
Transition of Care [facility]) - CM/SW Discharge Note   Patient Details  Name: Heather Floyd MRN: [id number] Date of Birth: [birth year]  [facility] CM/SW Contact:  Joanne Chars, LCSW Phone Number: 11/06/2021, 11:47 AM   Clinical Narrative:   Pt discharging to [facility].  RN call [phone] for report.     Final next level of care: Skilled Nursing Facility Barriers to Discharge: Barriers Resolved   Patient Goals and CMS Choice   CMS Medicare.gov Compare Post Acute Care list provided to:: Patient Represenative (must comment) Choice offered to / list presented to : Adult Children (daughter [name])  Discharge Placement              [facility] Patient to be transferred to facility by: [facility] Name of family member notified: daughter Randall Hiss in room Patient and family notified of of transfer: 11/06/21  Discharge Plan and Services In-house Referral: Clinical Social Work   Post Acute Care Choice: [place]                               Social Determinants of Health (SDOH) Interventions     Readmission Risk Interventions     View : No data to display.

## 2021-11-06 NOTE — Progress Notes (Signed)
Report given to [facility]. All questions and concerns were fully addressed.

## 2021-11-06 NOTE — Progress Notes (Signed)
Discharge summary packet/pertinent documents provided to PT[facility], Pt d/c to [facility] as ordered. Pt alert in no apparent distress.

## 2021-11-15 ENCOUNTER — Encounter: Payer: Self-pay | Admitting: Orthopaedic Surgery

## 2021-11-15 ENCOUNTER — Ambulatory Visit (INDEPENDENT_AMBULATORY_CARE_PROVIDER_SITE_OTHER): Payer: Medicare PPO

## 2021-11-15 ENCOUNTER — Ambulatory Visit (INDEPENDENT_AMBULATORY_CARE_PROVIDER_SITE_OTHER): Payer: Medicare PPO | Admitting: Orthopaedic Surgery

## 2021-11-15 DIAGNOSIS — M25551 Pain in right hip: Secondary | ICD-10-CM

## 2021-11-15 NOTE — Progress Notes (Signed)
Post-Op Visit Note   Patient: Heather Floyd           Date of Birth: 1933-05-13           MRN: [id number] Visit Date: 11/15/2021 PCP: Almet[facility]   Assessment & Plan:  Chief Complaint:  Chief Complaint  Patient presents with   Right Hip - Routine Post Op, Follow-up    Right hip IM nailing 10/31/2021   Visit Diagnoses:  1. Pain in right hip     Plan: Patient is a pleasant 88 year old female who is here today with her caregiver at [facility] nursing facility.  She is 2 weeks status post right hip IM nail 10/31/2021.  She has underlying dementia, but appears to be doing well overall.  Examination right hip reveals well-healed surgical incisions without complication.  Calf is soft nontender.  Today, staples were removed and Steri-Strips applied.  She will continue with physical therapy.  Follow-up in 4 weeks time for repeat evaluation and x-rays of the right femur.  Call with concerns or questions.  Follow-Up Instructions: Return in about 4 weeks (around 12/13/2021).   Orders:  Orders Placed This Encounter  Procedures   XR FEMUR, MIN 2 VIEWS RIGHT   No orders of the defined types were placed in this encounter.   Imaging: XR FEMUR, MIN 2 VIEWS RIGHT  Result Date: 11/15/2021 X-rays demonstrate stable alignment of the fracture without hardware complication   PMFS History: Patient Active Problem List   Diagnosis Date Noted   Closed fracture of right wrist    Hip fracture (HCC) 10/31/2021   Closed displaced intertrochanteric fracture of right femur (HCC)    Hyperglycemia    Pain in left elbow 08/10/2018   Rupture of left triceps tendon 07/26/2018   Closed olecranon fracture, left, initial encounter 07/24/2018   Pubic ramus fracture (HCC) 07/24/2018   Closed fracture of left inferior pubic ramus (HCC)    Fall 07/23/2018   Acute lower UTI 07/23/2018   Palliative care encounter    Colitis 08/25/2017   Abdominal pain 04/08/2017   Leukocytosis 04/08/2017    Dementia (HCC) 04/08/2017   Dehydration 04/08/2017   Abnormal urinalysis 04/08/2017   Enteritis 04/07/2017   Abnormality of gait 09/08/2014   Concussion with loss of consciousness 09/08/2014   Subdural hematoma (HCC) 08/11/2014   Hypokalemia 08/11/2014   SDH (subdural hematoma) (HCC) 08/11/2014   Hyperlipidemia    Hypertension    RLS (restless legs syndrome)    DJD (degenerative joint disease) of cervical spine    TIA (transient ischemic attack)    Carotid artery occlusion    Essential hypertension    Memory disturbance 03/29/2013   Near syncope 01/13/2013   Accelerated hypertension 01/13/2013   Hyperkalemia 01/13/2013   Past Medical History:  Diagnosis Date   Abnormality of gait 09/08/2014   Carotid artery occlusion    Concussion with loss of consciousness 09/08/2014   Dementia (HCC)    DJD (degenerative joint disease) of cervical spine    Hyperlipidemia    Hypertension    Memory disturbance 03/29/2013   Osteoporosis    Personal history of colonic polyps    RLS (restless legs syndrome)    Sleep disorder    TIA (transient ischemic attack)    Varicose veins     Family History  Problem Relation Age of Onset   Mental illness Mother    Alzheimer's disease Mother    Cancer Father    Bone cancer Father  Dementia Sister     Past Surgical History:  Procedure Laterality Date   ANTERIOR AND POSTERIOR VAGINAL REPAIR W/ SACROSPINOUS LIGAMENT SUSPENSION     CESAREAN SECTION     ENDOVENOUS ABLATION SAPHENOUS VEIN W/ LASER     HEMORRHOID SURGERY     INTRAMEDULLARY (IM) NAIL INTERTROCHANTERIC Right 10/31/2021   Procedure: RIGHT INTRAMEDULLARY (IM) NAIL INTERTROCHANTRIC;  Surgeon: Tarry Kos, MD;  Location: [facility] OR;  Service: Orthopedics;  Laterality: Right;   JOINT REPLACEMENT     ORIF ELBOW FRACTURE Left 07/26/2018   Procedure: OPEN REDUCTION INTERNAL FIXATION (ORIF) ELBOW/OLECRANON FRACTURE;  Surgeon: Tarry Kos, MD;  Location: [facility] OR;  Service: Orthopedics;  Laterality:  Left;   REDUCTION MAMMAPLASTY     TOTAL HIP ARTHROPLASTY Left 10/19/2017   Procedure: ANTERIOR HEMI HIP;  Surgeon: Tarry Kos, MD;  Location: [facility] OR;  Service: Orthopedics;  Laterality: Left;   Social History   Occupational History   Occupation: retired  Tobacco Use   Smoking status: Former    Types: Cigarettes    Quit date: 06/16/1980    Years since quitting: 41.4   Smokeless tobacco: Never  Vaping Use   Vaping Use: Never used  Substance and Sexual Activity   Alcohol use: No   Drug use: No   Sexual activity: Not on file

## 2022-05-05 ENCOUNTER — Telehealth: Payer: Self-pay | Admitting: Registered Nurse

## 2022-05-05 NOTE — Telephone Encounter (Signed)
error 

## 2022-05-07 NOTE — Telephone Encounter (Signed)
error 

## 2022-06-25 ENCOUNTER — Emergency Department (HOSPITAL_COMMUNITY): Payer: Medicare PPO

## 2022-06-25 ENCOUNTER — Other Ambulatory Visit: Payer: Self-pay

## 2022-06-25 ENCOUNTER — Emergency Department (HOSPITAL_COMMUNITY)
Admission: EM | Admit: 2022-06-25 | Discharge: 2022-06-26 | Disposition: A | Discharge status: Skilled Nursing Facility | Payer: Medicare PPO | Attending: Emergency Medicine | Admitting: Emergency Medicine

## 2022-06-25 ENCOUNTER — Encounter (HOSPITAL_COMMUNITY): Payer: Self-pay

## 2022-06-25 DIAGNOSIS — Y92129 Unspecified place in nursing home as the place of occurrence of the external cause: Secondary | ICD-10-CM | POA: Diagnosis not present

## 2022-06-25 DIAGNOSIS — R109 Unspecified abdominal pain: Secondary | ICD-10-CM | POA: Diagnosis not present

## 2022-06-25 DIAGNOSIS — F0390 Unspecified dementia without behavioral disturbance: Secondary | ICD-10-CM | POA: Diagnosis not present

## 2022-06-25 DIAGNOSIS — S29002A Unspecified injury of muscle and tendon of back wall of thorax, initial encounter: Secondary | ICD-10-CM | POA: Diagnosis present

## 2022-06-25 DIAGNOSIS — W19XXXA Unspecified fall, initial encounter: Secondary | ICD-10-CM | POA: Insufficient documentation

## 2022-06-25 DIAGNOSIS — S22069A Unspecified fracture of T7-T8 vertebra, initial encounter for closed fracture: Secondary | ICD-10-CM | POA: Diagnosis not present

## 2022-06-25 DIAGNOSIS — S22000A Wedge compression fracture of unspecified thoracic vertebra, initial encounter for closed fracture: Secondary | ICD-10-CM

## 2022-06-25 DIAGNOSIS — Z79899 Other long term (current) drug therapy: Secondary | ICD-10-CM | POA: Diagnosis not present

## 2022-06-25 DIAGNOSIS — I10 Essential (primary) hypertension: Secondary | ICD-10-CM | POA: Diagnosis not present

## 2022-06-25 DIAGNOSIS — S299XXA Unspecified injury of thorax, initial encounter: Secondary | ICD-10-CM | POA: Diagnosis not present

## 2022-06-25 LAB — CBC WITH DIFFERENTIAL/PLATELET
Abs Immature Granulocytes: 0.03 10*3/uL (ref 0.00–0.07)
Basophils Absolute: 0 10*3/uL (ref 0.0–0.1)
Basophils Relative: 1 %
Eosinophils Absolute: 0.3 10*3/uL (ref 0.0–0.5)
Eosinophils Relative: 4 %
HCT: 42.4 % (ref 36.0–46.0)
Hemoglobin: 14.2 g/dL (ref 12.0–15.0)
Immature Granulocytes: 1 %
Lymphocytes Relative: 13 %
Lymphs Abs: 0.8 10*3/uL (ref 0.7–4.0)
MCH: 29.5 pg (ref 26.0–34.0)
MCHC: 33.5 g/dL (ref 30.0–36.0)
MCV: 88.1 fL (ref 80.0–100.0)
Monocytes Absolute: 0.5 10*3/uL (ref 0.1–1.0)
Monocytes Relative: 7 %
Neutro Abs: 5 10*3/uL (ref 1.7–7.7)
Neutrophils Relative %: 74 %
Platelets: 132 10*3/uL — ABNORMAL LOW (ref 150–400)
RBC: 4.81 MIL/uL (ref 3.87–5.11)
RDW: 13.4 % (ref 11.5–15.5)
WBC: 6.6 10*3/uL (ref 4.0–10.5)
nRBC: 0 % (ref 0.0–0.2)

## 2022-06-25 LAB — COMPREHENSIVE METABOLIC PANEL
ALT: 9 U/L (ref 0–44)
AST: 16 U/L (ref 15–41)
Albumin: 3.6 g/dL (ref 3.5–5.0)
Alkaline Phosphatase: 66 U/L (ref 38–126)
Anion gap: 9 (ref 5–15)
BUN: 17 mg/dL (ref 8–23)
CO2: 27 mmol/L (ref 22–32)
Calcium: 9 mg/dL (ref 8.9–10.3)
Chloride: 103 mmol/L (ref 98–111)
Creatinine, Ser: 1.13 mg/dL — ABNORMAL HIGH (ref 0.44–1.00)
GFR, Estimated: 47 mL/min — ABNORMAL LOW (ref >60)
Glucose, Bld: 111 mg/dL — ABNORMAL HIGH (ref 70–99)
Potassium: 4.4 mmol/L (ref 3.5–5.1)
Sodium: 139 mmol/L (ref 135–145)
Total Bilirubin: 0.5 mg/dL (ref 0.3–1.2)
Total Protein: 6.9 g/dL (ref 6.5–8.1)

## 2022-06-25 MED ORDER — IOHEXOL 300 MG/ML  SOLN
80.0000 mL | Freq: Once | INTRAMUSCULAR | Status: AC | PRN
  Administered 2022-06-25: 80 mL via INTRAVENOUS

## 2022-06-25 MED ORDER — HYDROMORPHONE HCL 1 MG/ML IJ SOLN
0.5000 mg | Freq: Once | INTRAMUSCULAR | Status: AC
  Administered 2022-06-25: 0.5 mg via INTRAVENOUS
  Filled 2022-06-25: qty 1

## 2022-06-25 MED ORDER — ONDANSETRON HCL 4 MG/2ML IJ SOLN
4.0000 mg | Freq: Once | INTRAMUSCULAR | Status: AC
  Administered 2022-06-25: 4 mg via INTRAVENOUS
  Filled 2022-06-25: qty 2

## 2022-06-25 NOTE — ED Notes (Signed)
Pt needs labs for CT

## 2022-06-25 NOTE — ED Triage Notes (Signed)
Patient is from [facility], EMS states that starting saturday the patient refused to get up and walk, or sit up in bed. Would scream when moved around or the [facility] was elevated. They also state that during PT when the therapist attempted to pull her knees to her chest she screamed in pain. Family states that she had x-rays taken but do not know the results. Patient is usually ambulatory with a walker.

## 2022-06-25 NOTE — ED Notes (Signed)
Daughter refuses to leave BP cuff on

## 2022-06-25 NOTE — ED Notes (Signed)
Pt was placed on 3l of O2, due to desaturation after pain medications

## 2022-06-26 MED ORDER — HYDROCODONE-ACETAMINOPHEN 7.5-325 MG/15ML PO SOLN: 5.0000 mL | Freq: Three times a day (TID) | ORAL | 0 refills | Status: AC | PRN

## 2022-06-26 MED ORDER — HYDROCODONE-ACETAMINOPHEN 7.5-325 MG/15ML PO SOLN
5.0000 mL | Freq: Once | ORAL | Status: AC
  Administered 2022-06-26: 5 mL via ORAL
  Filled 2022-06-26: qty 15

## 2022-06-26 NOTE — ED Provider Notes (Signed)
I assumed care of this patient.  Please see previous provider note for further details of Hx, PE.  Briefly patient is a 89 y.o. female pending CT chest and abd.  CT notable for T7 subacute compression fracture. No other acute findings.  Family updated. Sats slightly low. Good pleth hard to obtain due her movement. Family aware and is ok with her being sent back to facility.  The patient appears reasonably screened and/or stabilized for discharge and I doubt any other medical condition or other [facility] requiring further screening, evaluation, or treatment in the ED at this time. I have discussed the findings, Dx and Tx plan with the patient/family who expressed understanding and agree(s) with the plan. Discharge instructions discussed at length. The patient/family was given strict return precautions who verbalized understanding of the instructions. No further questions at time of discharge.  Disposition: Discharge  Condition: Good  ED Discharge Orders          Ordered    HYDROcodone-acetaminophen (HYCET) 7.5-325 mg/15 ml solution  3 times daily PRN        06/26/22 [facility] narcotic database reviewed and no active prescriptions noted.   Follow Up: [facility] [address] [place] [address] [phone]  Call  to schedule an appointment for close follow up  Eustace Moore, MD [address] [place] [address] [phone]  Call  to schedule an appointment for close follow up         Anistyn Graddy, Grayce Sessions, MD 06/26/22 [phone]

## 2022-06-26 NOTE — ED Notes (Signed)
Sadiqu at SNF ask for [place] to call with a ETA [phone]

## 2022-06-26 NOTE — ED Provider Notes (Addendum)
[place] DEPT Provider Note   CSN: [id number] Arrival date & time: 06/25/22  1735     History  Chief Complaint  Patient presents with   Back Pain    Heather Floyd is a 89 y.o. female.  Patient brought in by EMS from [facility].  Patient's daughter is with her.  Patient was fine on Friday.  Starting on Saturday there is seem to be pain with movement but they can isolate with her was legs or whether it was back or abdomen.  No apparent pain with movement of upper extremities.  Patient had some x-rays done there but today but no one knows the results.  Patient normally walks very well with a walker.  Past medical history is significant for her history of dementia hyperlipidemia hypertension restless leg syndrome and sleep disorder.  Patient also known to have osteoporosis.       Home Medications Prior to Admission medications   Medication Sig Start Date End Date Taking? Authorizing Provider  acetaminophen (TYLENOL) 500 MG tablet Take 1,000 mg by mouth in the morning and at bedtime.    [provider]  amLODipine (NORVASC) 5 MG tablet Take 5 mg by mouth at bedtime.  02/01/18   [provider]  enoxaparin (LOVENOX) 40 MG/0.4ML injection Inject 0.4 mLs (40 mg total) into the skin daily for 14 days. 10/31/21 11/14/21  Leandrew Koyanagi, MD  famotidine (PEPCID) 20 MG tablet Take 20 mg by mouth daily.    [provider]  feeding supplement, ENSURE ENLIVE, (ENSURE ENLIVE) LIQD Take 237 mLs by mouth 3 (three) times daily between meals. 07/29/18   Elgergawy, Silver Huguenin, MD  gabapentin (NEURONTIN) 100 MG capsule Take 100 mg by mouth 3 (three) times daily. 03/30/18   [provider]  HYDROcodone-acetaminophen (NORCO) 5-325 MG tablet Take 1 tablet by mouth every 6 (six) hours as needed. 10/31/21   Leandrew Koyanagi, MD  hydrOXYzine (ATARAX/VISTARIL) 25 MG tablet Take 1 tablet (25 mg total) by mouth 3 (three) times daily as  needed. Patient taking differently: Take 25 mg by mouth 2 (two) times daily. 07/02/18   Ward Givens, NP  loperamide (IMODIUM A-D) 2 MG tablet Take 1 tablet (2 mg total) by mouth every 4 (four) hours as needed for diarrhea or loose stools. 11/06/21   France Ravens, MD  polyethylene glycol [facility]) packet Take 17 g by mouth daily as needed for moderate constipation. Patient taking differently: Take 17 g by mouth daily. 07/29/18   Elgergawy, Silver Huguenin, MD  QUEtiapine (SEROQUEL) 25 MG tablet Take 12.5 mg by mouth 2 (two) times daily. 12/25/18   [provider]  senna-docusate (SENOKOT-S) 8.6-50 MG tablet Take 3 tablets by mouth 2 (two) times daily. Patient taking differently: Take 1 tablet by mouth every evening. 07/29/18   Elgergawy, Silver Huguenin, MD  sertraline (ZOLOFT) 100 MG tablet Take 1 tablet (100 mg total) by mouth daily. 09/21/17   Ward Givens, NP  traZODone (DESYREL) 50 MG tablet Take 1.5 tablets (75 mg total) by mouth at bedtime. 07/02/18   Ward Givens, NP  Vitamin D, Ergocalciferol, (DRISDOL) 1.25 MG (50000 UNIT) CAPS capsule Take 50,000 Units by mouth every Tuesday.    [provider]      Allergies    Aspirin and Codeine    Review of Systems   Review of Systems  Unable to perform ROS: Dementia    Physical Exam Updated Vital Signs BP (!) 84/60  Pulse 87   Temp 97.8 F (36.6 C) (Oral)   Resp 18   Ht 1.549 m (5\' 1" )   Wt 51 kg   SpO2 (!) 88%   BMI 21.24 kg/m  Physical Exam Vitals and nursing note reviewed.  Constitutional:      General: She is not in acute distress.    Appearance: Normal appearance. She is well-developed.  HENT:     Head: Normocephalic and atraumatic.  Eyes:     Conjunctiva/sclera: Conjunctivae normal.  Cardiovascular:     Rate and Rhythm: Normal rate and regular rhythm.     Heart sounds: No murmur heard. Pulmonary:     Effort: Pulmonary effort is normal. No respiratory distress.     Breath sounds: Normal breath sounds. No  wheezing, rhonchi or rales.  Abdominal:     General: There is no distension.     Palpations: Abdomen is soft.     Tenderness: There is no abdominal tenderness. There is no guarding.     Comments: Abdomen soft and nontender.  Musculoskeletal:        General: No swelling or tenderness.     Cervical back: Neck supple.     Right lower leg: No edema.     Left lower leg: No edema.     Comments: No tenderness to palpation of lower extremities.  No obvious deformity.  No deformity upper extremities.  Spontaneous movement upper extremities without any evidence of any pain.  Palpation of the thoracic lumbar back does not seem to have any tenderness.  Skin:    General: Skin is warm and dry.     Capillary Refill: Capillary refill takes less than 2 seconds.  Neurological:     Mental Status: She is alert. Mental status is at baseline.     Comments: Patient is moving lower extremities spontaneously.  Initially there appeared to be pain with movement of them.  But now there appears not to be.  Psychiatric:        Mood and Affect: Mood normal.     ED Results / Procedures / Treatments   Labs (all labs ordered are listed, but only abnormal results are displayed) Labs Reviewed  CBC WITH DIFFERENTIAL/PLATELET - Abnormal; Notable for the following components:      Result Value   Platelets 132 (*)    All other components within normal limits  COMPREHENSIVE METABOLIC PANEL - Abnormal; Notable for the following components:   Glucose, Bld 111 (*)    Creatinine, Ser 1.13 (*)    GFR, Estimated 47 (*)    All other components within normal limits    EKG None  Radiology DG Foot Complete Left  Result Date: 06/25/2022 CLINICAL DATA:  Fall.  Pain. EXAM: LEFT FOOT - COMPLETE 3+ VIEW COMPARISON:  None Available. FINDINGS: The bones are diffusely osteopenic. There is no evidence of fracture or dislocation. There are mild degenerative changes of the first metatarsophalangeal joint. Soft tissues are  unremarkable. IMPRESSION: 1. No acute fracture or dislocation. 2. Diffuse osteopenia. Electronically Signed   By: 07-12-1968 M.D.   On: 06/25/2022 21:14   DG Femur 1V Right  Result Date: 06/25/2022 CLINICAL DATA:  Patient's screaming and pain when lower extremities are moved. Patient has Alzheimer's. EXAM: RIGHT FEMUR 1 VIEW; RIGHT TIBIA AND FIBULA - 2 VIEW; RIGHT FOOT COMPLETE - 3+ VIEW COMPARISON:  11/15/2021 FINDINGS: Intramedullary rod and screw fixation across a healed right femoral intertrochanteric fracture. No radiographic evidence of loosening. Demineralization. No acute fracture.  Degenerative arthritis about the right ankle and midfoot. IMPRESSION: No acute fracture or dislocation. Moderate-advanced arthritis about the right ankle and midfoot. Electronically Signed   By: Placido Sou M.D.   On: 06/25/2022 21:14   DG Foot Complete Right  Result Date: 06/25/2022 CLINICAL DATA:  Patient's screaming and pain when lower extremities are moved. Patient has Alzheimer's. EXAM: RIGHT FEMUR 1 VIEW; RIGHT TIBIA AND FIBULA - 2 VIEW; RIGHT FOOT COMPLETE - 3+ VIEW COMPARISON:  11/15/2021 FINDINGS: Intramedullary rod and screw fixation across a healed right femoral intertrochanteric fracture. No radiographic evidence of loosening. Demineralization. No acute fracture. Degenerative arthritis about the right ankle and midfoot. IMPRESSION: No acute fracture or dislocation. Moderate-advanced arthritis about the right ankle and midfoot. Electronically Signed   By: Placido Sou M.D.   On: 06/25/2022 21:14   DG Tibia/Fibula Right  Result Date: 06/25/2022 CLINICAL DATA:  Patient's screaming and pain when lower extremities are moved. Patient has Alzheimer's. EXAM: RIGHT FEMUR 1 VIEW; RIGHT TIBIA AND FIBULA - 2 VIEW; RIGHT FOOT COMPLETE - 3+ VIEW COMPARISON:  11/15/2021 FINDINGS: Intramedullary rod and screw fixation across a healed right femoral intertrochanteric fracture. No radiographic evidence of loosening.  Demineralization. No acute fracture. Degenerative arthritis about the right ankle and midfoot. IMPRESSION: No acute fracture or dislocation. Moderate-advanced arthritis about the right ankle and midfoot. Electronically Signed   By: Placido Sou M.D.   On: 06/25/2022 21:14   DG Tibia/Fibula Left  Result Date: 06/25/2022 CLINICAL DATA:  Fall, pain EXAM: LEFT TIBIA AND FIBULA - 2 VIEW COMPARISON:  None Available. FINDINGS: No fracture or malalignment.  Soft tissues are unremarkable. IMPRESSION: No acute osseous abnormality Electronically Signed   By: Donavan Foil M.D.   On: 06/25/2022 21:13   DG Femur 1V Left  Result Date: 06/25/2022 CLINICAL DATA:  Fall EXAM: LEFT FEMUR 1 VIEW COMPARISON:  None Available. FINDINGS: Left hip arthroplasty is present in anatomic alignment. There is no acute fracture or dislocation. There is no hardware loosening. Peripheral vascular calcifications are present. IMPRESSION: Negative. Electronically Signed   By: Ronney Asters M.D.   On: 06/25/2022 21:11    Procedures Procedures    Medications Ordered in ED Medications  ondansetron [facility]) injection 4 mg (4 mg Intravenous Given 06/25/22 2017)  HYDROmorphone (DILAUDID) injection 0.5 mg (0.5 mg Intravenous Given 06/25/22 2018)  iohexol (OMNIPAQUE) 300 MG/ML solution 80 mL (80 mLs Intravenous Contrast Given 06/25/22 2336)    ED Course/ Medical Decision Making/ A&P                           Medical Decision Making Amount and/or Complexity of Data Reviewed Labs: ordered. Radiology: ordered.  Risk Prescription drug management.  Due to patient's significant dementia.  Difficult to sort out if any injury at all.  There was no history of fall.  But there could have been something that occurred unwitnessed.  But also patient known to have osteoporosis and could have a spontaneous compression fracture.  No nausea no vomiting.  Labs done complete metabolic panel GFR 47 electrolytes normal liver function test normal  anion gap normal at 9.  CBC no leukocytosis hemoglobin is 14.2.  Platelets a little low at 132.  X-rays of both femurs and tib-fib and feet without any bony abnormalities.  There is evidence of moderate advanced arthritis and many of her joints.  Patient will get CT chest CT abdomen pelvis to evaluate pelvis hips lumbar back thoracic back as  well as any intra-abdominal process or any lung process.  Patient was initially treated with some pain medication.  It did drop her oxygen sats down a little bit and patient due to her dementia will not keep 2 L of oxygen on and are in place.  Long conversation with patient's daughter.  That the CTs will help [place] sort out anything significant.  And disposition will be based on the CT scans.   Final Clinical Impression(s) / ED Diagnoses Final diagnoses:  Dementia, unspecified dementia severity, unspecified dementia type, unspecified whether behavioral, psychotic, or mood disturbance or anxiety [facility])    Rx / DC Orders ED Discharge Orders     None         Fredia Sorrow, MD 06/26/22 0015    Fredia Sorrow, MD 06/26/22 0021

## 2022-11-27 ENCOUNTER — Other Ambulatory Visit: Payer: Self-pay

## 2022-11-27 ENCOUNTER — Emergency Department (HOSPITAL_COMMUNITY)
Admission: EM | Admit: 2022-11-27 | Discharge: 2022-11-27 | Disposition: A | Discharge status: Home / Self Care | Payer: Medicare PPO | Attending: Emergency Medicine | Admitting: Emergency Medicine

## 2022-11-27 ENCOUNTER — Encounter (HOSPITAL_COMMUNITY): Payer: Self-pay

## 2022-11-27 ENCOUNTER — Emergency Department (HOSPITAL_COMMUNITY): Payer: Medicare PPO

## 2022-11-27 DIAGNOSIS — Y92129 Unspecified place in nursing home as the place of occurrence of the external cause: Secondary | ICD-10-CM | POA: Diagnosis not present

## 2022-11-27 DIAGNOSIS — F0390 Unspecified dementia without behavioral disturbance: Secondary | ICD-10-CM | POA: Insufficient documentation

## 2022-11-27 DIAGNOSIS — W1830XA Fall on same level, unspecified, initial encounter: Secondary | ICD-10-CM | POA: Diagnosis not present

## 2022-11-27 DIAGNOSIS — S0083XA Contusion of other part of head, initial encounter: Secondary | ICD-10-CM | POA: Insufficient documentation

## 2022-11-27 NOTE — ED Notes (Signed)
POA refused for discharge vitals to be done.

## 2022-11-27 NOTE — ED Notes (Signed)
Patient transported to CT 

## 2022-11-27 NOTE — ED Notes (Signed)
Patients medical power of attorney at bedside, refusing for all vitals to be taken due to the fact that it upsets the patient. EDP notified.

## 2022-11-27 NOTE — ED Provider Notes (Signed)
[facility] EMERGENCY DEPARTMENT AT [facility] Provider Note   CSN: [id number] Arrival date & time: 11/27/22  0133     History  Chief Complaint  Patient presents with   Heather Floyd is a 89 y.o. female.  Presents to the emergency department by ambulance after unwitnessed fall at nursing home.  Patient with baseline dementia, cannot provide any further information.  Patient with noted contusion and laceration to forehead and nose.  No epistaxis.       Home Medications Prior to Admission medications   Medication Sig Start Date End Date Taking? Authorizing Provider  acetaminophen (TYLENOL) 500 MG tablet Take 1,000 mg by mouth in the morning and at bedtime.    [provider]  amLODipine (NORVASC) 5 MG tablet Take 5 mg by mouth at bedtime.  02/01/18   [provider]  enoxaparin (LOVENOX) 40 MG/0.4ML injection Inject 0.4 mLs (40 mg total) into the skin daily for 14 days. 10/31/21 11/14/21  Tarry Kos, MD  famotidine (PEPCID) 20 MG tablet Take 20 mg by mouth daily.    [provider]  feeding supplement, ENSURE ENLIVE, (ENSURE ENLIVE) LIQD Take 237 mLs by mouth 3 (three) times daily between meals. 07/29/18   Elgergawy, Leana Roe, MD  gabapentin (NEURONTIN) 100 MG capsule Take 100 mg by mouth 3 (three) times daily. 03/30/18   [provider]  hydrOXYzine (ATARAX/VISTARIL) 25 MG tablet Take 1 tablet (25 mg total) by mouth 3 (three) times daily as needed. Patient taking differently: Take 25 mg by mouth 2 (two) times daily. 07/02/18   Butch Penny, NP  loperamide (IMODIUM A-D) 2 MG tablet Take 1 tablet (2 mg total) by mouth every 4 (four) hours as needed for diarrhea or loose stools. 11/06/21   Park Pope, MD  polyethylene glycol [facility]) packet Take 17 g by mouth daily as needed for moderate constipation. Patient taking differently: Take 17 g by mouth daily. 07/29/18   Elgergawy, Leana Roe, MD  QUEtiapine (SEROQUEL) 25 MG tablet Take  12.5 mg by mouth 2 (two) times daily. 12/25/18   [provider]  senna-docusate (SENOKOT-S) 8.6-50 MG tablet Take 3 tablets by mouth 2 (two) times daily. Patient taking differently: Take 1 tablet by mouth every evening. 07/29/18   Elgergawy, Leana Roe, MD  sertraline (ZOLOFT) 100 MG tablet Take 1 tablet (100 mg total) by mouth daily. 09/21/17   Butch Penny, NP  traZODone (DESYREL) 50 MG tablet Take 1.5 tablets (75 mg total) by mouth at bedtime. 07/02/18   Butch Penny, NP  Vitamin D, Ergocalciferol, (DRISDOL) 1.25 MG (50000 UNIT) CAPS capsule Take 50,000 Units by mouth every Tuesday.    [provider]      Allergies    Aspirin and Codeine    Review of Systems   Review of Systems  Physical Exam Updated Vital Signs Ht 5\' 1"  (1.549 m)   Wt 48.1 kg   BMI 20.03 kg/m  Physical Exam Vitals and nursing note reviewed.  Constitutional:      General: She is not in acute distress.    Appearance: She is well-developed.  HENT:     Head: Normocephalic. Abrasion (uppoer central forehead) and contusion present.     Mouth/Throat:     Mouth: Mucous membranes are moist.  Eyes:     General: Vision grossly intact. Gaze aligned appropriately.     Extraocular Movements: Extraocular movements intact.     Conjunctiva/sclera: Conjunctivae normal.  Cardiovascular:  Rate and Rhythm: Normal rate and regular rhythm.     Pulses: Normal pulses.     Heart sounds: Normal heart sounds, S1 normal and S2 normal. No murmur heard.    No friction rub. No gallop.  Pulmonary:     Effort: Pulmonary effort is normal. No respiratory distress.     Breath sounds: Normal breath sounds.  Abdominal:     General: Bowel sounds are normal.     Palpations: Abdomen is soft.     Tenderness: There is no abdominal tenderness. There is no guarding or rebound.     Hernia: No hernia is present.  Musculoskeletal:        General: No swelling.     Cervical back: Full passive range of motion without pain,  normal range of motion and neck supple. No spinous process tenderness or muscular tenderness. Normal range of motion.     Right hip: Normal.     Left hip: Normal.     Right lower leg: No edema.     Left lower leg: No edema.  Skin:    General: Skin is warm and dry.     Capillary Refill: Capillary refill takes less than 2 seconds.     Findings: No ecchymosis, erythema, rash or wound.  Neurological:     General: No focal deficit present.     Mental Status: She is alert and oriented to person, place, and time.     GCS: GCS eye subscore is 4. GCS verbal subscore is 5. GCS motor subscore is 6.     Cranial Nerves: Cranial nerves 2-12 are intact.     Sensory: Sensation is intact.     Motor: Motor function is intact.     Coordination: Coordination is intact.  Psychiatric:        Attention and Perception: Attention normal.        Mood and Affect: Mood normal.        Speech: Speech normal.        Behavior: Behavior normal.     ED Results / Procedures / Treatments   Labs (all labs ordered are listed, but only abnormal results are displayed) Labs Reviewed - No data to display  EKG None  Radiology CT MAXILLOFACIAL WO CONTRAST  Result Date: 11/27/2022 CLINICAL DATA:  Facial trauma, blunt.  Fall. EXAM: CT MAXILLOFACIAL WITHOUT CONTRAST TECHNIQUE: Multidetector CT imaging of the maxillofacial structures was performed. Multiplanar CT image reconstructions were also generated. RADIATION DOSE REDUCTION: This exam was performed according to the departmental dose-optimization program which includes automated exposure control, adjustment of the mA and/or kV according to patient size and/or use of iterative reconstruction technique. COMPARISON:  None Available. FINDINGS: Osseous: No fracture or mandibular dislocation. No destructive process. Orbits: Negative. No traumatic or inflammatory finding. Sinuses: Clear Soft tissues: Soft tissue swelling over the base of the nose and the forehead. Limited  intracranial: See head CT report IMPRESSION: No facial or orbital fracture. Electronically Signed   By: Charlett Nose M.D.   On: 11/27/2022 02:08   CT CERVICAL SPINE WO CONTRAST  Result Date: 11/27/2022 CLINICAL DATA:  Neck trauma (Age >= 65y).  Fall. EXAM: CT CERVICAL SPINE WITHOUT CONTRAST TECHNIQUE: Multidetector CT imaging of the cervical spine was performed without intravenous contrast. Multiplanar CT image reconstructions were also generated. RADIATION DOSE REDUCTION: This exam was performed according to the departmental dose-optimization program which includes automated exposure control, adjustment of the mA and/or kV according to patient size and/or use of iterative reconstruction technique.  COMPARISON:  10/31/2021 FINDINGS: Alignment: 4 mm degenerative anterolisthesis of C4 on C5. Skull base and vertebrae: No acute fracture. No primary bone lesion or focal pathologic process. Soft tissues and spinal canal: No prevertebral fluid or swelling. No visible canal hematoma. Disc levels: Diffuse moderate degenerative facet disease bilaterally. Degenerative disc disease most pronounced at C5-6 and C6-7 with disc space narrowing and spurring. No visible disc herniation. Upper chest: No acute findings Other: None IMPRESSION: Cervical spondylosis. No acute bony abnormality. Electronically Signed   By: Charlett Nose M.D.   On: 11/27/2022 02:06   CT HEAD WO CONTRAST ( )  Result Date: 11/27/2022 CLINICAL DATA:  Head trauma, minor (Age >= 65y).  Fall. EXAM: CT HEAD WITHOUT CONTRAST TECHNIQUE: Contiguous axial images were obtained from the base of the skull through the vertex without intravenous contrast. RADIATION DOSE REDUCTION: This exam was performed according to the departmental dose-optimization program which includes automated exposure control, adjustment of the mA and/or kV according to patient size and/or use of iterative reconstruction technique. COMPARISON:  10/31/2021 FINDINGS: Brain: There is atrophy  and chronic small vessel disease changes. No acute intracranial abnormality. Specifically, no hemorrhage, hydrocephalus, mass lesion, acute infarction, or significant intracranial injury. Vascular: No hyperdense vessel or unexpected calcification. Skull: No acute calvarial abnormality. Sinuses/Orbits: No acute findings Other: Soft tissue swelling in the forehead region. IMPRESSION: Atrophy, chronic microvascular disease. No acute intracranial abnormality. Electronically Signed   By: Charlett Nose M.D.   On: 11/27/2022 02:05    Procedures Procedures    Medications Ordered in ED Medications - No data to display  ED Course/ Medical Decision Making/ A&P                             Medical Decision Making Amount and/or Complexity of Data Reviewed Radiology: ordered.   Presents after a fall.  Fall was unwitnessed, patient has dementia, she cannot provide any further information.  Patient's daughter, power of attorney present at bedside.  She wants minimal intervention.  She did agree to imaging.  CT head, maxillofacial bones, cervical spine without acute injury.  Scalp was cleaned, no scalp laceration.  She does have a contusion and abrasion on her forehead that did not require repair.        Final Clinical Impression(s) / ED Diagnoses Final diagnoses:  Contusion of face, initial encounter    Rx / DC Orders ED Discharge Orders     None         Ernestine Langworthy, Canary Brim, MD 11/27/22 [phone]

## 2022-11-27 NOTE — ED Triage Notes (Signed)
Patient bib [facility] from [facility] [place] after a mechanical fall. The fall was unwitnessed but it appears that she hit head, she has a hematoma to her head and a small laceration to the forehead. Patient refused C-collar with EMS but had complaints of neck pain. She is not on thinners.

## 2022-11-27 NOTE — ED Notes (Signed)
[facility] called no ETA

## 2023-07-28 ENCOUNTER — Encounter (HOSPITAL_COMMUNITY): Payer: Self-pay

## 2023-07-28 ENCOUNTER — Emergency Department (HOSPITAL_COMMUNITY): Payer: Medicare PPO

## 2023-07-28 ENCOUNTER — Other Ambulatory Visit: Payer: Self-pay

## 2023-07-28 ENCOUNTER — Emergency Department (HOSPITAL_COMMUNITY)
Admission: EM | Admit: 2023-07-28 | Discharge: 2023-07-29 | Disposition: A | Discharge status: Home / Self Care | Payer: Medicare PPO | Attending: Emergency Medicine | Admitting: Emergency Medicine

## 2023-07-28 DIAGNOSIS — I609 Nontraumatic subarachnoid hemorrhage, unspecified: Secondary | ICD-10-CM | POA: Diagnosis not present

## 2023-07-28 DIAGNOSIS — W19XXXA Unspecified fall, initial encounter: Secondary | ICD-10-CM | POA: Diagnosis not present

## 2023-07-28 DIAGNOSIS — S4992XA Unspecified injury of left shoulder and upper arm, initial encounter: Secondary | ICD-10-CM | POA: Diagnosis present

## 2023-07-28 DIAGNOSIS — S32592A Other specified fracture of left pubis, initial encounter for closed fracture: Secondary | ICD-10-CM | POA: Diagnosis not present

## 2023-07-28 DIAGNOSIS — S42202A Unspecified fracture of upper end of left humerus, initial encounter for closed fracture: Secondary | ICD-10-CM | POA: Diagnosis not present

## 2023-07-28 MED ORDER — FENTANYL CITRATE PF 50 MCG/ML IJ SOSY
50.0000 ug | PREFILLED_SYRINGE | Freq: Once | INTRAMUSCULAR | Status: AC
  Administered 2023-07-28: 50 ug via INTRAMUSCULAR
  Filled 2023-07-28: qty 1

## 2023-07-28 NOTE — ED Notes (Signed)
Pt daughter refused to have immobilizer/sling applied. States that her mother will not keep it on and does not understand that she needs to. Asked to speak with the doctor instead.

## 2023-07-28 NOTE — ED Provider Notes (Signed)
[facility]-EMERGENCY DEPT [facility] Emergency Department Provider Note MRN:  [id number]  Arrival date & time: 07/29/23     Chief Complaint   Fall   History of Present Illness   Heather Floyd is a 89 y.o. year-old female presents to the ED with chief complaint of fall.  Hx of dementia.  From SNF.  Had unwitnessed fall.  Contusion to left forehead.  EMS reported pain with movement of left arm.  Not anticoagulated.  History provided by patient.   Review of Systems  Pertinent positive and negative review of systems noted in HPI.    Physical Exam   Vitals:   07/28/23 2234  BP: (!) 142/108  Pulse: 86  Resp: 18  Temp: (!) 97.5 F (36.4 C)  SpO2: 93%    CONSTITUTIONAL:  non toxic-appearing, NAD NEURO:  Demented EYES:  eyes equal and reactive ENT/NECK:  Supple, no stridor, contusion to left forehead CARDIO:  normal rate, regular rhythm, appears well-perfused  PULM:  No respiratory distress, CTAB GI/GU:  non-distended,  MSK/SPINE:  No gross deformities, no edema, moves all extremities  SKIN:  no rash, atraumatic   *Additional and/or pertinent findings included in MDM below  Diagnostic and Interventional Summary    EKG Interpretation Date/Time:    Ventricular Rate:    PR Interval:    QRS Duration:    QT Interval:    QTC Calculation:   R Axis:      Text Interpretation:         Labs Reviewed - No data to display  CT HEAD WO CONTRAST ( )  Final Result    CT Cervical Spine Wo Contrast  Final Result    DG Knee Complete 4 Views Left  Final Result    DG Shoulder Left  Final Result    DG Forearm Left  Final Result    DG Pelvis Portable  Final Result    DG Chest Port 1 View  Final Result      Medications  fentaNYL (SUBLIMAZE) injection 50 mcg (50 mcg Intramuscular Given 07/28/23 2246)     Procedures  /  Critical Care Procedures  ED Course and Medical Decision Making  I have reviewed the triage vital signs, the nursing notes, and pertinent  available records from the EMR.  Social Determinants Affecting Complexity of Care: Patient has no clinically significant social determinants affecting this chief complaint..   ED Course: Clinical Course as of 07/29/23 0047  Wed Jul 29, 2023  0045 Patient is accompanied by her daughter, who is her healthcare power of attorney.  Patient has significant dementia.  She is from a skilled nursing facility.  After reviewing all of the images with the patient's daughter, daughter states that she would not tolerate any surgeries.  Last time she had any orthopedic surgeries, she sustained multiple new fractures.  Daughter states that she would like the patient to be discharged to her skilled nursing facility, where she will be more comfortable.  I discussed that the pubic ramus fracture and proximal humerus fracture would likely be supportive care anyway.  I did recommend that she have a repeat CT of her head in 8 hours, but daughter states that since she would not intervene even if it did get worse, she would like to have the patient sent back to the nursing facility.  I think that this sounds like the most compassionate plan for the patient.  I will discharge patient home with oxycodone.  Patient is able to take this crushed.  I have sent this to her pharmacy.  Daughter understands the risks and has capacity and authority to make decisions on behalf of the patient.  We discussed return precautions.  All parties are in agreement. [RB]    Clinical Course User Index [RB] Roxy Horseman, PA-C    Medical Decision Making Patient here after having had a mechanical fall.  She is not anticoagulated.  The fall was unwitnessed.  She has history of baseline dementia.  She has a contusion to her left forehead.  She seems to be guarding her left shoulder.  Daughter is also concerned about pain in the left leg.  Imaging ordered and is discussed below.  Amount and/or Complexity of Data Reviewed Radiology: ordered and  independent interpretation performed.    Details: Small subarachnoid hemorrhage present on CT head.  There is an acute appearing proximal left humerus fracture and probable left superior pubic ramus fracture  Risk Prescription drug management.         Consultants: I consulted with Esperanza Richters, APP for neurosurgery (answering for Dr. Jake Samples), who recommends repeat CT in 8 hours.   Treatment and Plan: I considered admission due to patient's initial presentation, but after considering the examination and diagnostic results, patient will not require admission and can be discharged with outpatient follow-up.    Final Clinical Impressions(s) / ED Diagnoses     ICD-10-CM   1. Fall, initial encounter  W19.XXXA     2. SAH (subarachnoid hemorrhage) (HCC)  I60.9     3. Closed fracture of proximal end of left humerus, unspecified fracture morphology, initial encounter  S42.202A     4. Closed fracture of ramus of left pubis, initial encounter [facility])  Y40.347Q       ED Discharge Orders          Ordered    oxyCODONE (ROXICODONE) 5 MG immediate release tablet  Every 6 hours PRN        07/29/23 0026              Discharge Instructions Discussed with and Provided to Patient:     Discharge Instructions      You have been diagnosed with a fracture of your left humerus.  Wear the sling as tolerated.  Avoid overhead motions.  Follow-up with the orthopedic doctor.  You also have a pelvic fracture.  Use your wheel chair if pain limits your ability to walk.  You have a small subarachnoid hemorrhage (bleeding on the brain).  Return for stroke symptoms, do not take any blood thinners.  Return for slurred speech, changes in behavior, new weakness or numbness in the extremities.        Roxy Horseman, PA-C 07/29/23 2595    Ernie Avena, MD 07/29/23 [phone]

## 2023-07-29 MED ORDER — FENTANYL CITRATE PF 50 MCG/ML IJ SOSY
50.0000 ug | PREFILLED_SYRINGE | Freq: Once | INTRAMUSCULAR | Status: AC
  Administered 2023-07-29: 50 ug via INTRAMUSCULAR
  Filled 2023-07-29: qty 1

## 2023-07-29 MED ORDER — OXYCODONE HCL 5 MG PO TABS: 5.0000 mg | ORAL_TABLET | Freq: Four times a day (QID) | ORAL | 0 refills | Status: AC | PRN

## 2023-07-29 NOTE — ED Notes (Signed)
[facility] arrived for transport, given discharge paperwork, DNR, and sling to return to facility.

## 2023-07-29 NOTE — Discharge Instructions (Signed)
You have been diagnosed with a fracture of your left humerus.  Wear the sling as tolerated.  Avoid overhead motions.  Follow-up with the orthopedic doctor.  You also have a pelvic fracture.  Use your wheel chair if pain limits your ability to walk.  You have a small subarachnoid hemorrhage (bleeding on the brain).  Return for stroke symptoms, do not take any blood thinners.  Return for slurred speech, changes in behavior, new weakness or numbness in the extremities.

## 2023-07-29 NOTE — ED Notes (Signed)
[facility] called for transport.
